# Patient Record
Sex: Female | Born: 1970
Health system: Southern US, Community
[De-identification: ages and names within clinical notes are randomized; demographics above are authoritative.]

## PROBLEM LIST (undated history)

## (undated) DIAGNOSIS — D649 Anemia, unspecified: Secondary | ICD-10-CM

## (undated) DIAGNOSIS — J189 Pneumonia, unspecified organism: Secondary | ICD-10-CM

## (undated) DIAGNOSIS — E119 Type 2 diabetes mellitus without complications: Secondary | ICD-10-CM

## (undated) DIAGNOSIS — H548 Legal blindness, as defined in USA: Secondary | ICD-10-CM

## (undated) DIAGNOSIS — J45909 Unspecified asthma, uncomplicated: Secondary | ICD-10-CM

## (undated) HISTORY — DX: Type 2 diabetes mellitus without complications: E11.9

---

## 2006-09-21 ENCOUNTER — Emergency Department (HOSPITAL_COMMUNITY): Admission: EM | Admit: 2006-09-21 | Discharge: 2006-09-21 | Payer: Self-pay | Admitting: Emergency Medicine

## 2006-09-23 ENCOUNTER — Emergency Department (HOSPITAL_COMMUNITY): Admission: EM | Admit: 2006-09-23 | Discharge: 2006-09-23 | Payer: Self-pay | Admitting: Emergency Medicine

## 2006-09-26 ENCOUNTER — Emergency Department (HOSPITAL_COMMUNITY): Admission: EM | Admit: 2006-09-26 | Discharge: 2006-09-26 | Payer: Self-pay | Admitting: Family Medicine

## 2006-12-07 ENCOUNTER — Emergency Department (HOSPITAL_COMMUNITY): Admission: EM | Admit: 2006-12-07 | Discharge: 2006-12-07 | Payer: Self-pay | Admitting: Family Medicine

## 2007-07-03 ENCOUNTER — Emergency Department (HOSPITAL_COMMUNITY): Admission: EM | Admit: 2007-07-03 | Discharge: 2007-07-03 | Payer: Self-pay | Admitting: Emergency Medicine

## 2007-12-03 ENCOUNTER — Emergency Department (HOSPITAL_COMMUNITY): Admission: EM | Admit: 2007-12-03 | Discharge: 2007-12-03 | Payer: Self-pay | Admitting: Emergency Medicine

## 2008-08-04 ENCOUNTER — Emergency Department (HOSPITAL_COMMUNITY): Admission: EM | Admit: 2008-08-04 | Discharge: 2008-08-04 | Payer: Self-pay | Admitting: Emergency Medicine

## 2010-09-01 ENCOUNTER — Emergency Department (HOSPITAL_COMMUNITY): Admission: EM | Admit: 2010-09-01 | Discharge: 2010-09-01 | Payer: Self-pay | Admitting: Emergency Medicine

## 2010-09-11 ENCOUNTER — Emergency Department (HOSPITAL_COMMUNITY)
Admission: EM | Admit: 2010-09-11 | Discharge: 2010-09-12 | Payer: Self-pay | Source: Home / Self Care | Admitting: Emergency Medicine

## 2010-12-18 LAB — POCT I-STAT, CHEM 8
BUN: 12 mg/dL (ref 6–23)
Chloride: 106 mEq/L (ref 96–112)
Creatinine, Ser: 1.1 mg/dL (ref 0.4–1.2)
Potassium: 3 mEq/L — ABNORMAL LOW (ref 3.5–5.1)
Sodium: 142 mEq/L (ref 135–145)
TCO2: 24 mmol/L (ref 0–100)

## 2010-12-18 LAB — CBC
HCT: 27.1 % — ABNORMAL LOW (ref 36.0–46.0)
MCV: 69.3 fL — ABNORMAL LOW (ref 78.0–100.0)
RDW: 17.9 % — ABNORMAL HIGH (ref 11.5–15.5)
WBC: 6.5 10*3/uL (ref 4.0–10.5)

## 2010-12-18 LAB — DIFFERENTIAL
Basophils Relative: 1 % (ref 0–1)
Eosinophils Absolute: 0.1 10*3/uL (ref 0.0–0.7)
Lymphocytes Relative: 48 % — ABNORMAL HIGH (ref 12–46)
Neutro Abs: 2.9 10*3/uL (ref 1.7–7.7)

## 2011-01-21 ENCOUNTER — Emergency Department (HOSPITAL_COMMUNITY)
Admission: EM | Admit: 2011-01-21 | Discharge: 2011-01-21 | Disposition: A | Discharge status: Home / Self Care | Payer: Self-pay | Attending: Emergency Medicine | Admitting: Emergency Medicine

## 2011-01-21 ENCOUNTER — Emergency Department (HOSPITAL_COMMUNITY): Payer: Self-pay

## 2011-01-21 DIAGNOSIS — R059 Cough, unspecified: Secondary | ICD-10-CM | POA: Insufficient documentation

## 2011-01-21 DIAGNOSIS — IMO0001 Reserved for inherently not codable concepts without codable children: Secondary | ICD-10-CM | POA: Insufficient documentation

## 2011-01-21 DIAGNOSIS — E669 Obesity, unspecified: Secondary | ICD-10-CM | POA: Insufficient documentation

## 2011-01-21 DIAGNOSIS — R05 Cough: Secondary | ICD-10-CM | POA: Insufficient documentation

## 2011-01-21 DIAGNOSIS — J45909 Unspecified asthma, uncomplicated: Secondary | ICD-10-CM | POA: Insufficient documentation

## 2011-01-21 DIAGNOSIS — R109 Unspecified abdominal pain: Secondary | ICD-10-CM | POA: Insufficient documentation

## 2011-01-21 DIAGNOSIS — J3489 Other specified disorders of nose and nasal sinuses: Secondary | ICD-10-CM | POA: Insufficient documentation

## 2011-02-12 ENCOUNTER — Emergency Department (HOSPITAL_COMMUNITY)
Admission: EM | Admit: 2011-02-12 | Discharge: 2011-02-13 | Disposition: A | Discharge status: Home / Self Care | Payer: Self-pay | Attending: Emergency Medicine | Admitting: Emergency Medicine

## 2011-02-12 DIAGNOSIS — L03119 Cellulitis of unspecified part of limb: Secondary | ICD-10-CM | POA: Insufficient documentation

## 2011-02-12 DIAGNOSIS — L02419 Cutaneous abscess of limb, unspecified: Secondary | ICD-10-CM | POA: Insufficient documentation

## 2011-06-29 LAB — DIFFERENTIAL
Basophils Relative: 1
Eosinophils Absolute: 0.1
Lymphs Abs: 2.7
Monocytes Absolute: 0.3
Neutro Abs: 4

## 2011-06-29 LAB — I-STAT 8, (EC8 V) (CONVERTED LAB)
BUN: 9
Bicarbonate: 24.5 — ABNORMAL HIGH
Glucose, Bld: 87
TCO2: 26
pH, Ven: 7.371 — ABNORMAL HIGH

## 2011-06-29 LAB — CBC
HCT: 31.3 — ABNORMAL LOW
Hemoglobin: 10 — ABNORMAL LOW
MCHC: 31.9
MCV: 78.1
RBC: 4.01

## 2011-06-29 LAB — POCT I-STAT CREATININE: Creatinine, Ser: 1

## 2011-07-19 ENCOUNTER — Emergency Department (HOSPITAL_COMMUNITY)
Admission: EM | Admit: 2011-07-19 | Discharge: 2011-07-19 | Disposition: A | Discharge status: Home / Self Care | Payer: Self-pay | Attending: Emergency Medicine | Admitting: Emergency Medicine

## 2011-07-19 ENCOUNTER — Emergency Department (HOSPITAL_COMMUNITY): Payer: Self-pay

## 2011-07-19 DIAGNOSIS — R059 Cough, unspecified: Secondary | ICD-10-CM | POA: Insufficient documentation

## 2011-07-19 DIAGNOSIS — R05 Cough: Secondary | ICD-10-CM | POA: Insufficient documentation

## 2011-07-19 DIAGNOSIS — R062 Wheezing: Secondary | ICD-10-CM | POA: Insufficient documentation

## 2011-07-19 DIAGNOSIS — J40 Bronchitis, not specified as acute or chronic: Secondary | ICD-10-CM | POA: Insufficient documentation

## 2011-07-19 DIAGNOSIS — R079 Chest pain, unspecified: Secondary | ICD-10-CM | POA: Insufficient documentation

## 2011-07-19 LAB — CULTURE, ROUTINE-ABSCESS: Gram Stain: NONE SEEN

## 2011-10-28 ENCOUNTER — Emergency Department (HOSPITAL_COMMUNITY)
Admission: EM | Admit: 2011-10-28 | Discharge: 2011-10-28 | Disposition: A | Discharge status: Home / Self Care | Payer: Self-pay | Attending: Emergency Medicine | Admitting: Emergency Medicine

## 2011-10-28 ENCOUNTER — Encounter (HOSPITAL_COMMUNITY): Payer: Self-pay | Admitting: Emergency Medicine

## 2011-10-28 ENCOUNTER — Emergency Department (HOSPITAL_COMMUNITY): Payer: Self-pay

## 2011-10-28 DIAGNOSIS — R5381 Other malaise: Secondary | ICD-10-CM | POA: Insufficient documentation

## 2011-10-28 DIAGNOSIS — IMO0001 Reserved for inherently not codable concepts without codable children: Secondary | ICD-10-CM | POA: Insufficient documentation

## 2011-10-28 DIAGNOSIS — J45901 Unspecified asthma with (acute) exacerbation: Secondary | ICD-10-CM

## 2011-10-28 DIAGNOSIS — Z79899 Other long term (current) drug therapy: Secondary | ICD-10-CM | POA: Insufficient documentation

## 2011-10-28 DIAGNOSIS — R070 Pain in throat: Secondary | ICD-10-CM | POA: Insufficient documentation

## 2011-10-28 DIAGNOSIS — R6889 Other general symptoms and signs: Secondary | ICD-10-CM

## 2011-10-28 MED ORDER — ALBUTEROL SULFATE (5 MG/ML) 0.5% IN NEBU
10.0000 mg | INHALATION_SOLUTION | Freq: Once | RESPIRATORY_TRACT | Status: AC
  Administered 2011-10-28: 10 mg via RESPIRATORY_TRACT
  Filled 2011-10-28: qty 2

## 2011-10-28 MED ORDER — IBUPROFEN 200 MG PO TABS
400.0000 mg | ORAL_TABLET | Freq: Once | ORAL | Status: AC
  Administered 2011-10-28: 400 mg via ORAL
  Filled 2011-10-28: qty 2

## 2011-10-28 MED ORDER — ALBUTEROL SULFATE (5 MG/ML) 0.5% IN NEBU: 2.5000 mg | INHALATION_SOLUTION | Freq: Four times a day (QID) | RESPIRATORY_TRACT | Status: DC | PRN

## 2011-10-28 MED ORDER — BENZONATATE 100 MG PO CAPS: 100.0000 mg | ORAL_CAPSULE | Freq: Three times a day (TID) | ORAL | Status: AC

## 2011-10-28 MED ORDER — ALBUTEROL SULFATE (5 MG/ML) 0.5% IN NEBU
5.0000 mg | INHALATION_SOLUTION | Freq: Once | RESPIRATORY_TRACT | Status: AC
  Administered 2011-10-28: 5 mg via RESPIRATORY_TRACT
  Filled 2011-10-28: qty 1

## 2011-10-28 MED ORDER — IPRATROPIUM BROMIDE 0.02 % IN SOLN
0.5000 mg | RESPIRATORY_TRACT | Status: AC
  Administered 2011-10-28: 0.5 mg via RESPIRATORY_TRACT
  Filled 2011-10-28: qty 2.5

## 2011-10-28 MED ORDER — PREDNISONE 10 MG PO TABS: 20.0000 mg | ORAL_TABLET | Freq: Every day | ORAL | Status: DC

## 2011-10-28 MED ORDER — PREDNISONE 20 MG PO TABS
60.0000 mg | ORAL_TABLET | Freq: Once | ORAL | Status: AC
  Administered 2011-10-28: 60 mg via ORAL
  Filled 2011-10-28: qty 3

## 2011-10-28 NOTE — ED Notes (Signed)
Last night felt sick, cough, and wheezing, audible wheezing,

## 2011-10-28 NOTE — ED Provider Notes (Signed)
Pt feeling much better after hour long neb. Pt originally seen by Jaci Carrel PAC. Pt will be given nebulizer solution for home, as she is currently out. Prednisone and Tessalon Perls. Pt is currently comfortable with plan and will return to ED if symptoms worsen.  Dorthula Matas, PA 10/28/11 1607

## 2011-10-28 NOTE — ED Provider Notes (Signed)
History     CSN: [id number]  Arrival date & time 10/28/11  1200   First MD Initiated Contact with Patient 10/28/11 1306      Chief Complaint  Patient presents with  . Cough  . Wheezing    (Consider location/radiation/quality/duration/timing/severity/associated sxs/prior treatment) HPI Comments: Patient presents emergency Department with an asthma exacerbation flulike symptoms.  Symptoms began last evening include cough,  sore throat,  body aches, fatigue.  Cough is non-productive. The patient states she has never been diagnosed with asthma and does not have medications at home.  She has needed a breathing treatment before for audible wheezing in the past.  Patient does not have a primary care doctor followup with.  She reports shortness of breath.  She denies chest pain, leg swelling, fevers, night sweats and chills.   The history is provided by the patient.    History reviewed. No pertinent past medical history.  History reviewed. No pertinent past surgical history.  No family history on file.  History  Substance Use Topics  . Smoking status: Not on file  . Smokeless tobacco: Not on file  . Alcohol Use: Not on file    OB History    Grav Para Term Preterm Abortions TAB SAB Ect Mult Living                  Review of Systems  Constitutional: Negative for fever, chills, appetite change and fatigue.  HENT: Positive for congestion and rhinorrhea. Negative for hearing loss, ear pain, nosebleeds, sore throat, sneezing, trouble swallowing, neck stiffness, voice change, postnasal drip, sinus pressure, tinnitus and ear discharge.   Eyes: Negative for photophobia and visual disturbance.  Respiratory: Positive for cough, chest tightness, shortness of breath and wheezing. Negative for apnea, choking and stridor.   Cardiovascular: Negative for chest pain, palpitations and leg swelling.  Gastrointestinal: Negative for nausea, vomiting, abdominal pain, diarrhea and constipation.    Genitourinary: Negative for dysuria, urgency and flank pain.  Musculoskeletal: Negative for myalgias.  Neurological: Negative for dizziness, seizures, syncope, weakness, light-headedness, numbness and headaches.  Psychiatric/Behavioral: Negative for behavioral problems and confusion.  All other systems reviewed and are negative.    Allergies  Review of patient's allergies indicates no known allergies.  Home Medications   Current Outpatient Rx  Name Route Sig Dispense Refill  . ACETAMINOPHEN 500 MG PO TABS Oral Take 500 mg by mouth every 6 (six) hours as needed. pain    . GUAIFENESIN 100 MG/5ML PO SOLN Oral Take 5 mLs by mouth every 4 (four) hours as needed. congestion      BP 127/74  Pulse 92  Temp(Src) 98 F (36.7 C) (Oral)  Resp 20  SpO2 97%  Physical Exam  Vitals reviewed. Constitutional: She is oriented to person, place, and time. Vital signs are normal. She appears well-developed and well-nourished. No distress. She is not intubated.       NAD  HENT:  Head: Normocephalic and atraumatic. No trismus in the jaw.  Right Ear: External ear normal. No drainage or tenderness. No mastoid tenderness.  Left Ear: External ear normal. No drainage or tenderness. No mastoid tenderness.  Nose: Nose normal. No rhinorrhea or sinus tenderness.  Mouth/Throat: Uvula is midline, oropharynx is clear and moist and mucous membranes are normal. No uvula swelling. No oropharyngeal exudate.  Eyes: Conjunctivae and EOM are normal. Pupils are equal, round, and reactive to light. Right eye exhibits no discharge. Left eye exhibits no discharge. No scleral icterus.  Neck: Normal range  of motion. Neck supple.  Cardiovascular: Normal rate, regular rhythm, normal heart sounds and intact distal pulses.   Pulmonary/Chest: Effort normal. No accessory muscle usage or stridor. No apnea and not tachypneic. She is not intubated. No respiratory distress. She has wheezes. She exhibits no tenderness.        Expiratory wheezing audible without stethoscope.  Patient able to speak in complete sentences.  Airway intact.  Abdominal: Soft. There is no tenderness.  Musculoskeletal: Normal range of motion. She exhibits no edema and no tenderness.  Neurological: She is alert and oriented to person, place, and time.  Skin: Skin is warm and dry. No rash noted. She is not diaphoretic.  Psychiatric: She has a normal mood and affect. Her behavior is normal.    ED Course  Procedures (including critical care time)  Labs Reviewed - No data to display Dg Chest 2 View  10/28/2011  *RADIOLOGY REPORT*  Clinical Data: Shortness of breath  CHEST - 2 VIEW  Comparison: 07/19/2011  Findings: Lungs are clear. No pleural effusion or pneumothorax.  Stable mild cardiomegaly.  Mild degenerative changes of the visualized thoracolumbar spine.  IMPRESSION: No evidence of acute cardiopulmonary disease.  Stable mild cardiomegaly.  Original Report Authenticated By: Charline Bills, M.D.     No diagnosis found.  Pt still wheezing after prednisone and neb. CXR negative. 1hr long neb treatment ordered. Pt likely has viral syndrome that has induced asthma exacerbation.  Discussed plan w tiffany. Admit if still audibly wheezing or unable to keep O2 sat in 90s after ambulation vs discharge with prednisone an albuterol inhaler and hycoden. Pt does not want tamiflu d/t cost.   MDM  Asthma exacerbation & flu like s/s  Pt care resumed by Marlon Pel PA-C.         Jaci Carrel, PA-C 10/28/11 [address] PA-C 10/28/11 1534

## 2011-10-28 NOTE — ED Notes (Signed)
Nasal congestion also

## 2011-10-28 NOTE — ED Provider Notes (Signed)
Medical screening examination/treatment/procedure(s) were performed by non-physician practitioner and as supervising physician I was immediately available for consultation/collaboration.   Rolan Bucco, MD 10/28/11 [phone]

## 2012-02-16 ENCOUNTER — Emergency Department (INDEPENDENT_AMBULATORY_CARE_PROVIDER_SITE_OTHER): Payer: Worker's Compensation

## 2012-02-16 ENCOUNTER — Encounter (HOSPITAL_COMMUNITY): Payer: Self-pay

## 2012-02-16 ENCOUNTER — Emergency Department (INDEPENDENT_AMBULATORY_CARE_PROVIDER_SITE_OTHER)
Admission: EM | Admit: 2012-02-16 | Discharge: 2012-02-16 | Disposition: A | Discharge status: Home / Self Care | Payer: Worker's Compensation | Source: Home / Self Care | Attending: Family Medicine | Admitting: Family Medicine

## 2012-02-16 DIAGNOSIS — S93402A Sprain of unspecified ligament of left ankle, initial encounter: Secondary | ICD-10-CM

## 2012-02-16 DIAGNOSIS — S93409A Sprain of unspecified ligament of unspecified ankle, initial encounter: Secondary | ICD-10-CM

## 2012-02-16 MED ORDER — DICLOFENAC POTASSIUM 50 MG PO TABS: 50.0000 mg | ORAL_TABLET | Freq: Three times a day (TID) | ORAL | Status: DC

## 2012-02-16 NOTE — ED Notes (Signed)
Pt has had lt foot pain since Nov after slipping and turning her foot. She didn't fall.

## 2012-02-16 NOTE — ED Provider Notes (Signed)
History     CSN: [id number]  Arrival date & time 02/16/12  1605   First MD Initiated Contact with Patient 02/16/12 1615      Chief Complaint  Patient presents with  . Foot Pain    (Consider location/radiation/quality/duration/timing/severity/associated sxs/prior treatment) Patient is a 41 y.o. female presenting with ankle pain. The history is provided by the patient.  Ankle Pain  The incident occurred more than 1 week ago (states fell 2 months ago and twisted left ankle, continues with pain since.). The incident occurred at work. The injury mechanism was a fall. The pain is present in the left ankle. The pain is moderate.    History reviewed. No pertinent past medical history.  History reviewed. No pertinent past surgical history.  History reviewed. No pertinent family history.  History  Substance Use Topics  . Smoking status: Current Everyday Smoker  . Smokeless tobacco: Not on file  . Alcohol Use: No    OB History    Grav Para Term Preterm Abortions TAB SAB Ect Mult Living                  Review of Systems  Constitutional: Negative.   Musculoskeletal: Positive for gait problem. Negative for joint swelling.    Allergies  Review of patient's allergies indicates no known allergies.  Home Medications   Current Outpatient Rx  Name Route Sig Dispense Refill  . ACETAMINOPHEN 500 MG PO TABS Oral Take 500 mg by mouth every 6 (six) hours as needed. pain    . ALBUTEROL SULFATE (5 MG/ML) 0.5% IN NEBU Nebulization Take 0.5 mLs (2.5 mg total) by nebulization every 6 (six) hours as needed for wheezing. 20 mL 5  . DICLOFENAC POTASSIUM 50 MG PO TABS Oral Take 1 tablet (50 mg total) by mouth 3 (three) times daily. As needed for pain 30 tablet 0  . GUAIFENESIN 100 MG/5ML PO SOLN Oral Take 5 mLs by mouth every 4 (four) hours as needed. congestion    . PREDNISONE 10 MG PO TABS Oral Take 2 tablets (20 mg total) by mouth daily. 15 tablet 0    BP 135/53  Pulse 71  Temp(Src)  98.1 F (36.7 C) (Oral)  Resp 18  SpO2 100%  LMP 02/03/2012  Physical Exam  Nursing note and vitals reviewed. Constitutional: She is oriented to person, place, and time. She appears well-developed and well-nourished.  Musculoskeletal: She exhibits tenderness. She exhibits no edema.       Left ankle: She exhibits no swelling, no ecchymosis and no deformity. tenderness. AITFL and CF ligament tenderness found. No lateral malleolus, no medial malleolus, no posterior TFL, no head of 5th metatarsal and no proximal fibula tenderness found.  Neurological: She is alert and oriented to person, place, and time.    ED Course  Procedures (including critical care time)  Labs Reviewed - No data to display Dg Ankle Complete Left  02/16/2012  *RADIOLOGY REPORT*  Clinical Data: Lateral ankle pain.  LEFT ANKLE COMPLETE - 3+ VIEW  Comparison: None.  Findings: Imaged bones, joints and soft tissues appear normal.  IMPRESSION: Negative exam.  Original Report Authenticated By: Bernadene Bell. D'ALESSIO, M.D.     1. Mild ankle sprain, left, initial encounter       MDM          Linna Hoff, MD 02/16/12 [phone]

## 2012-09-30 ENCOUNTER — Emergency Department (HOSPITAL_COMMUNITY)
Admission: EM | Admit: 2012-09-30 | Discharge: 2012-09-30 | Disposition: A | Discharge status: Home / Self Care | Payer: Self-pay | Attending: Emergency Medicine | Admitting: Emergency Medicine

## 2012-09-30 ENCOUNTER — Encounter (HOSPITAL_COMMUNITY): Payer: Self-pay | Admitting: *Deleted

## 2012-09-30 ENCOUNTER — Emergency Department (HOSPITAL_COMMUNITY): Payer: Self-pay

## 2012-09-30 DIAGNOSIS — R51 Headache: Secondary | ICD-10-CM | POA: Insufficient documentation

## 2012-09-30 DIAGNOSIS — R05 Cough: Secondary | ICD-10-CM | POA: Insufficient documentation

## 2012-09-30 DIAGNOSIS — R059 Cough, unspecified: Secondary | ICD-10-CM | POA: Insufficient documentation

## 2012-09-30 DIAGNOSIS — N926 Irregular menstruation, unspecified: Secondary | ICD-10-CM | POA: Insufficient documentation

## 2012-09-30 DIAGNOSIS — F17200 Nicotine dependence, unspecified, uncomplicated: Secondary | ICD-10-CM | POA: Insufficient documentation

## 2012-09-30 DIAGNOSIS — J45909 Unspecified asthma, uncomplicated: Secondary | ICD-10-CM

## 2012-09-30 DIAGNOSIS — R209 Unspecified disturbances of skin sensation: Secondary | ICD-10-CM | POA: Insufficient documentation

## 2012-09-30 DIAGNOSIS — Z3202 Encounter for pregnancy test, result negative: Secondary | ICD-10-CM | POA: Insufficient documentation

## 2012-09-30 DIAGNOSIS — J45901 Unspecified asthma with (acute) exacerbation: Secondary | ICD-10-CM | POA: Insufficient documentation

## 2012-09-30 DIAGNOSIS — N949 Unspecified condition associated with female genital organs and menstrual cycle: Secondary | ICD-10-CM | POA: Insufficient documentation

## 2012-09-30 DIAGNOSIS — D649 Anemia, unspecified: Secondary | ICD-10-CM | POA: Insufficient documentation

## 2012-09-30 DIAGNOSIS — N938 Other specified abnormal uterine and vaginal bleeding: Secondary | ICD-10-CM | POA: Insufficient documentation

## 2012-09-30 LAB — CBC WITH DIFFERENTIAL/PLATELET
Basophils Absolute: 0 10*3/uL (ref 0.0–0.1)
Basophils Relative: 0 % (ref 0–1)
Eosinophils Relative: 1 % (ref 0–5)
HCT: 25.3 % — ABNORMAL LOW (ref 36.0–46.0)
Hemoglobin: 7.3 g/dL — ABNORMAL LOW (ref 12.0–15.0)
MCH: 20.3 pg — ABNORMAL LOW (ref 26.0–34.0)
MCHC: 28.9 g/dL — ABNORMAL LOW (ref 30.0–36.0)
MCV: 70.5 fL — ABNORMAL LOW (ref 78.0–100.0)
Monocytes Absolute: 0.4 10*3/uL (ref 0.1–1.0)
Monocytes Relative: 4 % (ref 3–12)
Neutro Abs: 6 10*3/uL (ref 1.7–7.7)
RDW: 18.4 % — ABNORMAL HIGH (ref 11.5–15.5)

## 2012-09-30 LAB — BASIC METABOLIC PANEL
CO2: 25 mEq/L (ref 19–32)
Calcium: 9.3 mg/dL (ref 8.4–10.5)
Chloride: 103 mEq/L (ref 96–112)
Creatinine, Ser: 0.84 mg/dL (ref 0.50–1.10)
Glucose, Bld: 84 mg/dL (ref 70–99)

## 2012-09-30 LAB — POCT PREGNANCY, URINE: Preg Test, Ur: NEGATIVE

## 2012-09-30 MED ORDER — IPRATROPIUM BROMIDE 0.02 % IN SOLN
0.5000 mg | Freq: Once | RESPIRATORY_TRACT | Status: AC
  Administered 2012-09-30: 0.5 mg via RESPIRATORY_TRACT
  Filled 2012-09-30: qty 2.5

## 2012-09-30 MED ORDER — ALBUTEROL SULFATE HFA 108 (90 BASE) MCG/ACT IN AERS: 1.0000 | INHALATION_SPRAY | Freq: Four times a day (QID) | RESPIRATORY_TRACT | Status: DC | PRN

## 2012-09-30 MED ORDER — ALBUTEROL SULFATE (5 MG/ML) 0.5% IN NEBU
5.0000 mg | INHALATION_SOLUTION | Freq: Once | RESPIRATORY_TRACT | Status: AC
  Administered 2012-09-30: 5 mg via RESPIRATORY_TRACT
  Filled 2012-09-30: qty 1

## 2012-09-30 MED ORDER — FERROUS SULFATE 325 (65 FE) MG PO TABS: 325.0000 mg | ORAL_TABLET | Freq: Every day | ORAL | Status: DC

## 2012-09-30 NOTE — ED Notes (Signed)
Pt reports numbness to bil hands, unable to grip well. C/o generalized weakness, "I think it's my BP." No known Hx HTN. Also reports vaginal bleeding x3 months with clots. Has not sought treatment for this.

## 2012-09-30 NOTE — ED Notes (Signed)
Pt reports wheezing and chest tightness x1-2 weeks.

## 2012-09-30 NOTE — ED Provider Notes (Signed)
History     CSN: [id number]  Arrival date & time 09/30/12  1435   First MD Initiated Contact with Patient 09/30/12 1512      Chief Complaint  Patient presents with  . Weakness  . Headache  . Numbness    (Consider location/radiation/quality/duration/timing/severity/associated sxs/prior treatment) HPI Patient presents to the emergency room with complaints of cough, wheezing bodyaches and general weakness for the last week. Patient does smoke but she has been trying to quit. She has not noticed any fevers. She denies any sore throat or congestion.  She does think her ankles are swollen sometimes. She has not noticed any particular weight change. She denies polydipsia or polyuria  Patient also has been having irregular vaginal bleeding for the last 3 months. She has not been able to see her primary doctor she does not have insurance yet.  The patient is also concerned about the possibly of high blood pressure. She's been told about her blood pressure in the past but does not currently take any medications for that.  History reviewed. No pertinent past medical history.  Past Surgical History  Procedure Date  . Cesarean section     No family history on file.  History  Substance Use Topics  . Smoking status: Current Every Day Smoker  . Smokeless tobacco: Not on file  . Alcohol Use: Yes     Comment: occasionally     OB History    Grav Para Term Preterm Abortions TAB SAB Ect Mult Living                  Review of Systems  All other systems reviewed and are negative.    Allergies  Review of patient's allergies indicates no known allergies.  Home Medications  No current outpatient prescriptions on file.  BP 175/64  Temp 98.2 F (36.8 C) (Oral)  Resp 27  SpO2 100%  LMP 09/30/2012  Physical Exam  Nursing note and vitals reviewed. Constitutional: She is oriented to person, place, and time. No distress.       Obese  HENT:  Head: Normocephalic and atraumatic.   Right Ear: External ear normal.  Left Ear: External ear normal.  Eyes: Conjunctivae normal are normal. Right eye exhibits no discharge. Left eye exhibits no discharge. No scleral icterus.  Neck: Neck supple. No tracheal deviation present.  Cardiovascular: Normal rate, regular rhythm and intact distal pulses.   Pulmonary/Chest: Effort normal and breath sounds normal. No stridor. No respiratory distress. She has no wheezes. She has no rales.  Abdominal: Soft. Bowel sounds are normal. She exhibits no distension. There is no tenderness. There is no rebound and no guarding.  Musculoskeletal: She exhibits no edema and no tenderness.       No pitting edema noted  Neurological: She is alert and oriented to person, place, and time. She has normal strength. No cranial nerve deficit ( no gross defecits noted) or sensory deficit. She exhibits normal muscle tone. She displays no seizure activity. Coordination normal.  Skin: Skin is warm and dry. No rash noted.  Psychiatric: She has a normal mood and affect.    ED Course  Procedures (including critical care time)  Rate: 85  Rhythm: normal sinus rhythm  QRS Axis: normal  Intervals: normal  ST/T Wave abnormalities: normal  Conduction Disutrbances:none  Narrative Interpretation: nl  Old EKG Reviewed: none available  Labs Reviewed  CBC WITH DIFFERENTIAL - Abnormal; Notable for the following:    RBC 3.59 (*)  Hemoglobin 7.3 (*)     HCT 25.3 (*)     MCV 70.5 (*)     MCH 20.3 (*)     MCHC 28.9 (*)     RDW 18.4 (*)     Platelets 611 (*)     All other components within normal limits  BASIC METABOLIC PANEL - Abnormal; Notable for the following:    GFR calc non Af Amer 86 (*)     All other components within normal limits  POCT PREGNANCY, URINE   Dg Chest 2 View  09/30/2012  *RADIOLOGY REPORT*  Clinical Data: 41 year old female weakness, headache, numbness.  CHEST - 2 VIEW  Comparison: 10/28/2011 and earlier.  Findings: AP semi upright and  lateral views of the chest 1559 hours.  Large body habitus.  Lung volumes are within normal limits. Cardiomegaly versus mediastinal lipomatosis. Other mediastinal contours are within normal limits.  Visualized tracheal air column is within normal limits.  No pneumothorax, pulmonary edema, pleural effusion or acute pulmonary opacity. No acute osseous abnormality identified.  IMPRESSION:  No acute cardiopulmonary abnormality.   Cardiomegaly versus mediastinal lipomatosis.   Original Report Authenticated By: Erskine Speed, M.D.      1. Anemia   2. Asthma   3. Dysfunctional uterine bleeding       MDM  Patient has been having irregular menstrual periods for the last 3 months.  Patient has a history of irregular menses.  At this point her vital signs are stable.  Recommend that she followup with a gynecologist for further evaluation. I reviewed her old records and the patient was noted to have similar symptoms back in 2011. She was found to be anemic with a similar hemoglobin and was recommended followup with gynecologist. Patient was unable to do so. The stomach and I feel that she requires an emergent transfusion. He'll prescribe her iron. She will be given resources to followup with gynecologist as an outpatient .  The patient was counseled on smoking cessation. I will give her prescription for albuterol         Celene Kras, MD 09/30/12 1743

## 2012-12-27 ENCOUNTER — Emergency Department (HOSPITAL_COMMUNITY)
Admission: EM | Admit: 2012-12-27 | Discharge: 2012-12-27 | Disposition: A | Discharge status: Home / Self Care | Payer: Self-pay | Attending: Emergency Medicine | Admitting: Emergency Medicine

## 2012-12-27 ENCOUNTER — Encounter (HOSPITAL_COMMUNITY): Payer: Self-pay | Admitting: *Deleted

## 2012-12-27 ENCOUNTER — Emergency Department (HOSPITAL_COMMUNITY): Payer: Self-pay

## 2012-12-27 DIAGNOSIS — J209 Acute bronchitis, unspecified: Secondary | ICD-10-CM | POA: Insufficient documentation

## 2012-12-27 DIAGNOSIS — F17200 Nicotine dependence, unspecified, uncomplicated: Secondary | ICD-10-CM | POA: Insufficient documentation

## 2012-12-27 DIAGNOSIS — R05 Cough: Secondary | ICD-10-CM | POA: Insufficient documentation

## 2012-12-27 DIAGNOSIS — Z79899 Other long term (current) drug therapy: Secondary | ICD-10-CM | POA: Insufficient documentation

## 2012-12-27 DIAGNOSIS — R059 Cough, unspecified: Secondary | ICD-10-CM | POA: Insufficient documentation

## 2012-12-27 DIAGNOSIS — IMO0001 Reserved for inherently not codable concepts without codable children: Secondary | ICD-10-CM | POA: Insufficient documentation

## 2012-12-27 DIAGNOSIS — R062 Wheezing: Secondary | ICD-10-CM | POA: Insufficient documentation

## 2012-12-27 MED ORDER — ACETAMINOPHEN 500 MG PO TABS
1000.0000 mg | ORAL_TABLET | Freq: Once | ORAL | Status: AC
  Administered 2012-12-27: 1000 mg via ORAL
  Filled 2012-12-27: qty 2

## 2012-12-27 MED ORDER — PREDNISONE 10 MG PO TABS: 40.0000 mg | ORAL_TABLET | Freq: Every day | ORAL | Status: DC

## 2012-12-27 MED ORDER — ALBUTEROL SULFATE (5 MG/ML) 0.5% IN NEBU
5.0000 mg | INHALATION_SOLUTION | Freq: Once | RESPIRATORY_TRACT | Status: AC
  Administered 2012-12-27: 5 mg via RESPIRATORY_TRACT
  Filled 2012-12-27: qty 0.5

## 2012-12-27 MED ORDER — PREDNISONE 20 MG PO TABS
40.0000 mg | ORAL_TABLET | Freq: Once | ORAL | Status: AC
  Administered 2012-12-27: 40 mg via ORAL
  Filled 2012-12-27: qty 2

## 2012-12-27 MED ORDER — ALBUTEROL SULFATE (5 MG/ML) 0.5% IN NEBU
5.0000 mg | INHALATION_SOLUTION | Freq: Once | RESPIRATORY_TRACT | Status: AC
  Administered 2012-12-27: 5 mg via RESPIRATORY_TRACT
  Filled 2012-12-27: qty 1

## 2012-12-27 NOTE — ED Notes (Signed)
Patient transported to X-ray 

## 2012-12-27 NOTE — ED Notes (Signed)
Pt with cough x 2 days with wheezing and L sided chest pain post cough since last night.  Sats of 97 but rr of 30.

## 2012-12-27 NOTE — ED Provider Notes (Signed)
History     CSN: [id number]  Arrival date & time 12/27/12  1129   First MD Initiated Contact with Patient 12/27/12 1158      Chief Complaint  Patient presents with  . Shortness of Breath  . Cough    (Consider location/radiation/quality/duration/timing/severity/associated sxs/prior treatment) HPI Presents with wheezing shortness of breath and cough onset 2 days ago. Maximum temperature 100. Cough nonproductive. Patient has had similar episodes in the past with "colds or whenever I get a virus". No vomiting. Admits to diffuse myalgias. No other associated symptoms. Treated with albuterol inhaler and over-the-counter cough medicines, without relief. Nothing makes symptoms better or worse. History reviewed. No pertinent past medical history.  Past Surgical History  Procedure Laterality Date  . Cesarean section      No family history on file.  History  Substance Use Topics  . Smoking status: Current Every Day Smoker -- 0.50 packs/day    Types: Cigarettes  . Smokeless tobacco: Not on file  . Alcohol Use: Yes     Comment: occasionally    Denies alcohol denies drug use OB History   Grav Para Term Preterm Abortions TAB SAB Ect Mult Living                  Review of Systems  Respiratory: Positive for cough, shortness of breath and wheezing.   Musculoskeletal: Positive for myalgias.    Allergies  Review of patient's allergies indicates no known allergies.  Home Medications   Current Outpatient Rx  Name  Route  Sig  Dispense  Refill  . albuterol (PROVENTIL HFA;VENTOLIN HFA) 108 (90 BASE) MCG/ACT inhaler   Inhalation   Inhale 1-2 puffs into the lungs every 6 (six) hours as needed for wheezing.   1 Inhaler   0   . ferrous sulfate 325 (65 FE) MG tablet   Oral   Take 1 tablet (325 mg total) by mouth daily.   30 tablet   0     BP 129/89  Pulse 105  Temp(Src) 99 F (37.2 C) (Oral)  Resp 30  SpO2 97%  LMP 12/26/2012  Physical Exam  Nursing note and vitals  reviewed. Constitutional: She appears well-developed and well-nourished.  HENT:  Head: Normocephalic and atraumatic.  Eyes: Conjunctivae are normal. Pupils are equal, round, and reactive to light.  Neck: Neck supple. No tracheal deviation present. No thyromegaly present.  Cardiovascular: Normal rate and regular rhythm.   No murmur heard. Pulmonary/Chest:  Mild retractions prolonged expiratory phase with expiratory wheezes. Speaks in sentences. Coughing occasionally.  Abdominal: Soft. Bowel sounds are normal. She exhibits no distension. There is no tenderness.  Morbidly obese  Musculoskeletal: Normal range of motion. She exhibits no edema and no tenderness.  Neurological: She is alert. Coordination normal.  Skin: Skin is warm and dry. No rash noted.  Psychiatric: She has a normal mood and affect.    ED Course  Procedures (including critical care time) 12:45 PM breathing with improved after treatment with albuterol nebulizer . Labs Reviewed - No data to display No results found. Results for orders placed during the hospital encounter of 09/30/12  CBC WITH DIFFERENTIAL      Result Value Range   WBC 9.4  4.0 - 10.5 K/uL   RBC 3.59 (*) 3.87 - 5.11 MIL/uL   Hemoglobin 7.3 (*) 12.0 - 15.0 g/dL   HCT 57.8 (*) 46.9 - 62.9 %   MCV 70.5 (*) 78.0 - 100.0 fL   MCH 20.3 (*) 26.0 - 34.0  pg   MCHC 28.9 (*) 30.0 - 36.0 g/dL   RDW 78.2 (*) 95.6 - 21.3 %   Platelets 611 (*) 150 - 400 K/uL   Neutrophils Relative 64  43 - 77 %   Neutro Abs 6.0  1.7 - 7.7 K/uL   Lymphocytes Relative 31  12 - 46 %   Lymphs Abs 2.9  0.7 - 4.0 K/uL   Monocytes Relative 4  3 - 12 %   Monocytes Absolute 0.4  0.1 - 1.0 K/uL   Eosinophils Relative 1  0 - 5 %   Eosinophils Absolute 0.1  0.0 - 0.7 K/uL   Basophils Relative 0  0 - 1 %   Basophils Absolute 0.0  0.0 - 0.1 K/uL   WBC Morphology MILD LEFT SHIFT (1-5% METAS, OCC MYELO, OCC BANDS)     RBC Morphology POLYCHROMASIA PRESENT     Smear Review PLATELET COUNT  CONFIRMED BY SMEAR    BASIC METABOLIC PANEL      Result Value Range   Sodium 136  135 - 145 mEq/L   Potassium 3.7  3.5 - 5.1 mEq/L   Chloride 103  96 - 112 mEq/L   CO2 25  19 - 32 mEq/L   Glucose, Bld 84  70 - 99 mg/dL   BUN 11  6 - 23 mg/dL   Creatinine, Ser 0.86  0.50 - 1.10 mg/dL   Calcium 9.3  8.4 - 57.8 mg/dL   GFR calc non Af Amer 86 (*) >90 mL/min   GFR calc Af Amer >90  >90 mL/min  POCT PREGNANCY, URINE      Result Value Range   Preg Test, Ur NEGATIVE  NEGATIVE   Dg Chest 2 View (if Patient Has Fever And/or Copd)  12/27/2012  *RADIOLOGY REPORT*  Clinical Data: Cough, shortness of breath, chest pain due to cough  CHEST - 2 VIEW  Comparison: 09/30/1929  Findings: Pain mild cardiac enlargement stable.  Vascular pattern normal.  Lungs clear.  No pleural effusion.  IMPRESSION: No acute findings.   Original Report Authenticated By: Esperanza Heir, M.D.      No diagnosis found.  Chest x-ray viewed by me 1:45 PM patient states breathing is almost at baseline. She wishes to go home. On exam she speaks in paragraphs lungs with minimal end expiratory wheezes. No use of accessory muscles. No respiratory distress MDM   Counseled patient for 5 minutes on smoking cessation Plan prescription prednisone. She is encouraged to use her albuterol inhaler 2 puffs every 4 hours as needed for shortness of breath or wheeze return if needed 1 every 4 hours Referral resource guide Diagnosis #1 acute bronchitis #2 tobacco abuse        Doug Sou, MD 12/27/12 [phone]

## 2012-12-31 ENCOUNTER — Inpatient Hospital Stay (HOSPITAL_COMMUNITY)
Admission: EM | Admit: 2012-12-31 | Discharge: 2013-01-03 | DRG: 202 | Disposition: A | Discharge status: Home / Self Care | Payer: MEDICAID | Attending: Internal Medicine | Admitting: Internal Medicine

## 2012-12-31 ENCOUNTER — Emergency Department (HOSPITAL_COMMUNITY): Payer: Self-pay

## 2012-12-31 ENCOUNTER — Encounter (HOSPITAL_COMMUNITY): Payer: Self-pay | Admitting: *Deleted

## 2012-12-31 DIAGNOSIS — F17200 Nicotine dependence, unspecified, uncomplicated: Secondary | ICD-10-CM

## 2012-12-31 DIAGNOSIS — Z87891 Personal history of nicotine dependence: Secondary | ICD-10-CM | POA: Diagnosis present

## 2012-12-31 DIAGNOSIS — J45901 Unspecified asthma with (acute) exacerbation: Principal | ICD-10-CM | POA: Diagnosis present

## 2012-12-31 DIAGNOSIS — J45902 Unspecified asthma with status asthmaticus: Secondary | ICD-10-CM

## 2012-12-31 DIAGNOSIS — J069 Acute upper respiratory infection, unspecified: Secondary | ICD-10-CM | POA: Diagnosis present

## 2012-12-31 DIAGNOSIS — Z6841 Body Mass Index (BMI) 40.0 and over, adult: Secondary | ICD-10-CM

## 2012-12-31 HISTORY — DX: Unspecified asthma, uncomplicated: J45.909

## 2012-12-31 LAB — POCT I-STAT, CHEM 8
BUN: 11 mg/dL (ref 6–23)
Creatinine, Ser: 0.8 mg/dL (ref 0.50–1.10)
Glucose, Bld: 135 mg/dL — ABNORMAL HIGH (ref 70–99)
Potassium: 3.7 mEq/L (ref 3.5–5.1)
Sodium: 141 mEq/L (ref 135–145)

## 2012-12-31 LAB — CBC WITH DIFFERENTIAL/PLATELET
Basophils Relative: 0 % (ref 0–1)
HCT: 32.5 % — ABNORMAL LOW (ref 36.0–46.0)
Hemoglobin: 9.1 g/dL — ABNORMAL LOW (ref 12.0–15.0)
Lymphocytes Relative: 34 % (ref 12–46)
Lymphs Abs: 2 10*3/uL (ref 0.7–4.0)
MCHC: 28 g/dL — ABNORMAL LOW (ref 30.0–36.0)
MCV: 73.2 fL — ABNORMAL LOW (ref 78.0–100.0)
Monocytes Relative: 7 % (ref 3–12)
Neutro Abs: 3.4 10*3/uL (ref 1.7–7.7)
RDW: 19.2 % — ABNORMAL HIGH (ref 11.5–15.5)
WBC: 5.8 10*3/uL (ref 4.0–10.5)

## 2012-12-31 MED ORDER — ALBUTEROL SULFATE (5 MG/ML) 0.5% IN NEBU
5.0000 mg | INHALATION_SOLUTION | Freq: Once | RESPIRATORY_TRACT | Status: AC
  Administered 2012-12-31: 5 mg via RESPIRATORY_TRACT
  Filled 2012-12-31: qty 1

## 2012-12-31 MED ORDER — MAGNESIUM SULFATE 40 MG/ML IJ SOLN
2.0000 g | Freq: Once | INTRAMUSCULAR | Status: AC
  Administered 2012-12-31: 2 g via INTRAVENOUS
  Filled 2012-12-31: qty 50

## 2012-12-31 MED ORDER — ALBUTEROL (5 MG/ML) CONTINUOUS INHALATION SOLN
10.0000 mg/h | INHALATION_SOLUTION | RESPIRATORY_TRACT | Status: DC
  Administered 2012-12-31: 10 mg/h via RESPIRATORY_TRACT

## 2012-12-31 MED ORDER — PREDNISONE 20 MG PO TABS
60.0000 mg | ORAL_TABLET | Freq: Once | ORAL | Status: AC
  Administered 2012-12-31: 60 mg via ORAL
  Filled 2012-12-31: qty 3

## 2012-12-31 MED ORDER — IPRATROPIUM BROMIDE 0.02 % IN SOLN
1.0000 mg | Freq: Once | RESPIRATORY_TRACT | Status: AC
  Administered 2012-12-31: 1 mg via RESPIRATORY_TRACT
  Filled 2012-12-31: qty 5

## 2012-12-31 MED ORDER — METHYLPREDNISOLONE SODIUM SUCC 40 MG IJ SOLR
40.0000 mg | Freq: Two times a day (BID) | INTRAMUSCULAR | Status: DC
  Administered 2012-12-31: 40 mg via INTRAVENOUS
  Filled 2012-12-31 (×3): qty 1

## 2012-12-31 MED ORDER — ACETAMINOPHEN 325 MG PO TABS: 650.0000 mg | ORAL_TABLET | Freq: Four times a day (QID) | ORAL | Status: DC | PRN

## 2012-12-31 MED ORDER — ALBUTEROL SULFATE (5 MG/ML) 0.5% IN NEBU: 2.5000 mg | INHALATION_SOLUTION | RESPIRATORY_TRACT | Status: DC | PRN

## 2012-12-31 MED ORDER — IPRATROPIUM BROMIDE 0.02 % IN SOLN
RESPIRATORY_TRACT | Status: AC
  Administered 2012-12-31: 0.5 mg via RESPIRATORY_TRACT
  Filled 2012-12-31: qty 2.5

## 2012-12-31 MED ORDER — LEVOFLOXACIN 250 MG PO TABS
250.0000 mg | ORAL_TABLET | ORAL | Status: DC
  Administered 2012-12-31: 250 mg via ORAL
  Filled 2012-12-31 (×2): qty 1

## 2012-12-31 MED ORDER — ENOXAPARIN SODIUM 40 MG/0.4ML ~~LOC~~ SOLN
40.0000 mg | SUBCUTANEOUS | Status: DC
  Filled 2012-12-31 (×4): qty 0.4

## 2012-12-31 MED ORDER — PANTOPRAZOLE SODIUM 40 MG PO TBEC
40.0000 mg | DELAYED_RELEASE_TABLET | Freq: Every day | ORAL | Status: DC
  Administered 2013-01-01 – 2013-01-02 (×2): 40 mg via ORAL
  Filled 2012-12-31 (×2): qty 1

## 2012-12-31 MED ORDER — ALBUTEROL SULFATE (5 MG/ML) 0.5% IN NEBU
2.5000 mg | INHALATION_SOLUTION | Freq: Four times a day (QID) | RESPIRATORY_TRACT | Status: DC
  Administered 2012-12-31 – 2013-01-01 (×3): 2.5 mg via RESPIRATORY_TRACT
  Filled 2012-12-31 (×2): qty 0.5

## 2012-12-31 MED ORDER — ACETAMINOPHEN 650 MG RE SUPP: 650.0000 mg | Freq: Four times a day (QID) | RECTAL | Status: DC | PRN

## 2012-12-31 MED ORDER — ALBUTEROL (5 MG/ML) CONTINUOUS INHALATION SOLN
INHALATION_SOLUTION | RESPIRATORY_TRACT | Status: AC
  Administered 2012-12-31: 10 mg/h via RESPIRATORY_TRACT
  Filled 2012-12-31: qty 20

## 2012-12-31 MED ORDER — ZOLPIDEM TARTRATE 5 MG PO TABS
5.0000 mg | ORAL_TABLET | Freq: Once | ORAL | Status: AC
Start: 2012-12-31 — End: 2012-12-31
  Administered 2012-12-31: 5 mg via ORAL
  Filled 2012-12-31: qty 1

## 2012-12-31 MED ORDER — GUAIFENESIN-DM 100-10 MG/5ML PO SYRP: 5.0000 mL | ORAL_SOLUTION | ORAL | Status: DC | PRN

## 2012-12-31 MED ORDER — ONDANSETRON HCL 4 MG/2ML IJ SOLN: 4.0000 mg | Freq: Four times a day (QID) | INTRAMUSCULAR | Status: DC | PRN

## 2012-12-31 MED ORDER — ONDANSETRON HCL 4 MG PO TABS: 4.0000 mg | ORAL_TABLET | Freq: Four times a day (QID) | ORAL | Status: DC | PRN

## 2012-12-31 MED ORDER — IPRATROPIUM BROMIDE 0.02 % IN SOLN
0.5000 mg | RESPIRATORY_TRACT | Status: AC
  Administered 2012-12-31: 0.5 mg via RESPIRATORY_TRACT

## 2012-12-31 NOTE — ED Notes (Signed)
Pt presents with asthma exacerbation.  Has been using inhaler and breathing tx with no relief.  Auditory wheezing noted.  Sats of 100 with labored breathing.

## 2012-12-31 NOTE — ED Notes (Signed)
Called for dinner tray 

## 2012-12-31 NOTE — H&P (Signed)
[facility] History and Physical  Hannah Baldwin WUJ:[id number] DOB: 07/09/1971 DOA: 12/31/2012  Referring physician: ED  PCP: no pcp    Chief Complaint:  Chief Complaint  Patient presents with  . Shortness of Breath    asthma     HPI: Hannah Baldwin is a 42 y.o. female with history of reactive airway, who presents to the emergency room today with one-week of worsening dyspnea, wheezing, coughing, shortness of breath with minimal activity. She was in the emergency room 3 days ago and received albuterol treatment and prednisone pack and today she noticed that she was already worse as soon as she finished albuterol and prednisone from home. She reports also some fevers and chills prior to the visit on Saturday to the ER. She denies any exposure to allergens. She has never been intubated or hospitalized before. She has never had pulmonary function testing before.    Review of Systems: The patient denies anorexia,weight loss,, vision loss, decreased hearing, hoarseness, chest pain, syncope,  peripheral edema, balance deficits, hemoptysis, abdominal pain, melena, hematochezia, severe indigestion/heartburn, hematuria, incontinence, genital sores, muscle weakness, suspicious skin lesions, transient blindness, difficulty walking, depression, unusual weight change, abnormal bleeding, enlarged lymph nodes, angioedema, and breast masses.    Past Medical History  Diagnosis Date  . Asthma    Past Surgical History  Procedure Laterality Date  . Cesarean section     Social History:  reports that she has been smoking Cigarettes.  She has been smoking about 0.50 packs per day. She does not have any smokeless tobacco history on file. She reports that she does not drink alcohol or use illicit drugs.   No Known Allergies  Family History  Problem Relation Age of Onset  . Asthma Mother   . Asthma Daughter      Prior to Admission medications   Medication Sig Start Date End Date Taking?  Authorizing Provider  acetaminophen (TYLENOL) 500 MG tablet Take 1,000 mg by mouth every 6 (six) hours as needed for pain.   Yes Historical Provider, MD  albuterol (PROVENTIL HFA;VENTOLIN HFA) 108 (90 BASE) MCG/ACT inhaler Inhale 1-2 puffs into the lungs every 6 (six) hours as needed for wheezing. 09/30/12  Yes Celene Kras, MD  predniSONE (DELTASONE) 10 MG tablet Take 4 tablets (40 mg total) by mouth daily. 12/27/12  Yes Doug Sou, MD   Physical Exam: Filed Vitals:   12/31/12 1442 12/31/12 1530 12/31/12 1606 12/31/12 1715  BP:    144/78  Pulse:    89  Temp:  97.3 F (36.3 C)    TempSrc:  Oral    Resp:    16  SpO2: 100%  100% 100%     General:  Alert and oriented x3  Eyes: Pupil equal round and reactive to light and accommodation  ENT: Clear pharynx without exudates, normal appearing external ears  Neck: No JVD  Cardiovascular: Regular rate and rhythm without murmur subscales  Respiratory: Widespread wheezes, no rhonchi no crackles  Abdomen: Soft nontender bowel sounds are present  Skin: Warm dry without rashes  Musculoskeletal: Intact muscle bulk and tone  Psychiatric: Euthymic  Neurologic: Cranial nerves 2-12 intact, strength 5 out of 5 in all 4 extremities, sensation intact  Labs on Admission:  Basic Metabolic Panel:  Recent Labs Lab 12/31/12 1515  NA 141  K 3.7  CL 105  GLUCOSE 135*  BUN 11  CREATININE 0.80   Liver Function Tests: No results found for this basename: AST, ALT, ALKPHOS, BILITOT, PROT,  ALBUMIN,  in the last 168 hours No results found for this basename: LIPASE, AMYLASE,  in the last 168 hours No results found for this basename: AMMONIA,  in the last 168 hours CBC:  Recent Labs Lab 12/31/12 1457 12/31/12 1515  WBC 5.8  --   NEUTROABS 3.4  --   HGB 9.1* 11.6*  HCT 32.5* 34.0*  MCV 73.2*  --   PLT 452*  --    Cardiac Enzymes: No results found for this basename: CKTOTAL, CKMB, CKMBINDEX, TROPONINI,  in the last 168 hours  BNP  (last 3 results) No results found for this basename: PROBNP,  in the last 8760 hours CBG: No results found for this basename: GLUCAP,  in the last 168 hours  Radiological Exams on Admission: Dg Chest 2 View (if Patient Has Fever And/or Copd)  12/31/2012  *RADIOLOGY REPORT*  Clinical Data: Shortness of breath, myalgia  CHEST - 2 VIEW  Comparison: 12/27/2012  Findings: Cardiomediastinal silhouette is within normal limits. The lungs are clear. No pleural effusion.  No pneumothorax.  No acute osseous abnormality.  Mild prominence of the cardiac silhouette is noted, likely in part due to hypoaeration.  IMPRESSION: No acute abnormality.  Borderline cardiomegaly.   Original Report Authenticated By: Christiana Pellant, M.D.       Assessment/Plan Principal Problem:   Asthma exacerbation   1. Asthma exacerbation-most likely brought on by an episode of upper respiratory tract infection. Patient has been ill now for greater than a week so this point in time she'll probably benefit from hospitalization for observation, IV steroids, IV magnesium sulfate, oral antibiotics, alongside nebulizer albuterol.   Code Status: full  Family Communication: patient  Disposition Plan: home   Meleah Demeyer [facility] Pager [phone]  If 7PM-7AM, please contact night-coverage [email or web address] Password [facility] 12/31/2012, 5:55 PM

## 2012-12-31 NOTE — ED Provider Notes (Signed)
History     CSN: [id number]  Arrival date & time 12/31/12  1258   First MD Initiated Contact with Patient 12/31/12 1414      Chief Complaint  Patient presents with  . Shortness of Breath    asthma    (Consider location/radiation/quality/duration/timing/severity/associated sxs/prior treatment) HPI Complains of wheezing onset approximate 5 days ago. Patient seen here 12/27/2012 for same complaint treated in the emergency department without much improved on discharge prescribed prednisone which she has been compliant with. Breathing became worse today. She felt that he had improved until today. She sees her inhaler every 4 hours today with transient relief. Patient continues to cough, nonproductive. She has not smoked since discharge from the emergency department 03/22. Nothing makes symptoms better or worse. Past Medical History  Diagnosis Date  . Asthma     Past Surgical History  Procedure Laterality Date  . Cesarean section      No family history on file.  History  Substance Use Topics  . Smoking status: Current Every Day Smoker -- 0.50 packs/day    Types: Cigarettes  . Smokeless tobacco: Not on file  . Alcohol Use: Yes     Comment: occasionally     OB History   Grav Para Term Preterm Abortions TAB SAB Ect Mult Living                  Review of Systems  Constitutional: Negative.   HENT: Negative.   Respiratory: Positive for cough, shortness of breath and wheezing.   Cardiovascular: Negative.   Gastrointestinal: Negative.   Musculoskeletal: Negative.   Skin: Negative.   Neurological: Negative.   Psychiatric/Behavioral: Negative.   All other systems reviewed and are negative.    Allergies  Review of patient's allergies indicates no known allergies.  Home Medications   Current Outpatient Rx  Name  Route  Sig  Dispense  Refill  . acetaminophen (TYLENOL) 500 MG tablet   Oral   Take 1,000 mg by mouth every 6 (six) hours as needed for pain.         Marland Kitchen  albuterol (PROVENTIL HFA;VENTOLIN HFA) 108 (90 BASE) MCG/ACT inhaler   Inhalation   Inhale 1-2 puffs into the lungs every 6 (six) hours as needed for wheezing.         . predniSONE (DELTASONE) 10 MG tablet   Oral   Take 4 tablets (40 mg total) by mouth daily.   15 tablet   0     BP 142/76  Pulse 69  Temp(Src) 97.7 F (36.5 C) (Oral)  Resp 22  SpO2 100%  LMP 12/31/2012  Physical Exam  Nursing note and vitals reviewed. Constitutional: She appears well-developed and well-nourished.  HENT:  Head: Normocephalic and atraumatic.  Eyes: Conjunctivae are normal. Pupils are equal, round, and reactive to light.  Neck: Neck supple. No tracheal deviation present. No thyromegaly present.  Cardiovascular: Normal rate and regular rhythm.   No murmur heard. Pulmonary/Chest: She has wheezes.  Prolonged expiratory phase with expiratory wheezes, cough occasionally  Abdominal: Soft. Bowel sounds are normal. She exhibits no distension. There is no tenderness.  Morbidly obese  Musculoskeletal: Normal range of motion. She exhibits no edema and no tenderness.  Neurological: She is alert. Coordination normal.  Skin: Skin is warm and dry. No rash noted.  Psychiatric: She has a normal mood and affect.    ED Course  Procedures (including critical care time)  Labs Reviewed  CBC WITH DIFFERENTIAL   No results found.  No diagnosis found. Chest x-ray viewed by me Patient placed on wheeze protocol. 5:25 PM patient breathing is not at baseline after 2 hours of continuous nebulization. She speaks sentences. Lungs with prolonged expiratory phase expiratory wheezes she's improved over initial presentation. But not at baseline MDM   Spoke with Dr. Lavera Guise Plan med surge floor 23 hour observation Diagnosis #1 status asthmaticus #2 hyperglycemia         Doug Sou, MD 12/31/12 1735

## 2013-01-01 DIAGNOSIS — Z87891 Personal history of nicotine dependence: Secondary | ICD-10-CM | POA: Diagnosis present

## 2013-01-01 DIAGNOSIS — F17200 Nicotine dependence, unspecified, uncomplicated: Secondary | ICD-10-CM

## 2013-01-01 MED ORDER — ALBUTEROL SULFATE (5 MG/ML) 0.5% IN NEBU
2.5000 mg | INHALATION_SOLUTION | RESPIRATORY_TRACT | Status: DC
  Administered 2013-01-01 – 2013-01-02 (×5): 2.5 mg via RESPIRATORY_TRACT
  Filled 2013-01-01 (×5): qty 0.5

## 2013-01-01 MED ORDER — NICOTINE 14 MG/24HR TD PT24
14.0000 mg | MEDICATED_PATCH | Freq: Every day | TRANSDERMAL | Status: DC
  Administered 2013-01-01 – 2013-01-03 (×3): 14 mg via TRANSDERMAL
  Filled 2013-01-01 (×3): qty 1

## 2013-01-01 MED ORDER — LEVOFLOXACIN IN D5W 500 MG/100ML IV SOLN
500.0000 mg | INTRAVENOUS | Status: DC
  Administered 2013-01-01 – 2013-01-03 (×3): 500 mg via INTRAVENOUS
  Filled 2013-01-01 (×3): qty 100

## 2013-01-01 MED ORDER — METHYLPREDNISOLONE SODIUM SUCC 125 MG IJ SOLR
80.0000 mg | Freq: Two times a day (BID) | INTRAMUSCULAR | Status: DC
  Administered 2013-01-01 – 2013-01-02 (×3): 80 mg via INTRAVENOUS
  Filled 2013-01-01: qty 1.28
  Filled 2013-01-01: qty 2
  Filled 2013-01-01 (×3): qty 1.28

## 2013-01-01 MED ORDER — IPRATROPIUM BROMIDE 0.02 % IN SOLN: 0.5000 mg | RESPIRATORY_TRACT | Status: DC

## 2013-01-01 MED ORDER — GUAIFENESIN ER 600 MG PO TB12
1200.0000 mg | ORAL_TABLET | Freq: Two times a day (BID) | ORAL | Status: DC
  Administered 2013-01-01 – 2013-01-03 (×5): 1200 mg via ORAL
  Filled 2013-01-01 (×6): qty 2

## 2013-01-01 MED ORDER — IPRATROPIUM BROMIDE 0.02 % IN SOLN
0.5000 mg | RESPIRATORY_TRACT | Status: DC
  Administered 2013-01-01 – 2013-01-02 (×5): 0.5 mg via RESPIRATORY_TRACT
  Filled 2013-01-01 (×5): qty 2.5

## 2013-01-01 MED ORDER — ZOLPIDEM TARTRATE 5 MG PO TABS
5.0000 mg | ORAL_TABLET | Freq: Every evening | ORAL | Status: DC | PRN
  Administered 2013-01-01 – 2013-01-02 (×2): 5 mg via ORAL
  Filled 2013-01-01 (×2): qty 1

## 2013-01-01 MED ORDER — ALBUTEROL SULFATE (5 MG/ML) 0.5% IN NEBU: 2.5000 mg | INHALATION_SOLUTION | RESPIRATORY_TRACT | Status: DC

## 2013-01-01 NOTE — Progress Notes (Signed)
Pre/Post Peak Flow 150/160

## 2013-01-01 NOTE — Progress Notes (Signed)
-   I have review the data and agree with plan as above. - Cont steroids at a higher dose and INhalers.

## 2013-01-01 NOTE — Progress Notes (Signed)
[facility] PROGRESS NOTE  Hannah Baldwin ZOX:[id number] DOB: April 09, 1971 DOA: 12/31/2012 PCP: Default, Provider, MD  Assessment/Plan:  Asthma exacerbation Most likely brought on by viral respiratory tract infection. Treating with IV Solumedrol, scheduled nebs, IV levaquin, Flutter Valve Will check 2 D echo  Tobacco abuse Patient commits to quitting.  Obesity. ?OSA.  Will work with patient to obtain a PCP and establish care.    DVT Prophylaxis:    Code Status: full Family Communication: family at bedside Disposition Plan: inpatient    Consultants:   Procedures:  2D echo pending  Antibiotics:  levaquin   HPI/Subjective: The patient reports she is a 1/2 pack a day smoker.  She rarely has symptoms like this.  She came to the ED for chest tightness and wheezing  Objective: Filed Vitals:   12/31/12 2113 01/01/13 0144 01/01/13 0512 01/01/13 0821  BP:   152/92   Pulse: 78  64   Temp:   98 F (36.7 C)   TempSrc:   Oral   Resp: 18 18 20    Height:      Weight:      SpO2: 98%  100% 98%    Intake/Output Summary (Last 24 hours) at 01/01/13 1322 Last data filed at 01/01/13 0500  Gross per 24 hour  Intake    360 ml  Output      2 ml  Net    358 ml   Filed Weights   12/31/12 2100  Weight: 143.7 kg (316 lb 12.8 oz)    Exam:   General:  Obese, female, A&O, unable to finish a sentence without gasping.  Cardiovascular: RRR, no murmurs, rubs or gallops, no lower extremity edema  Respiratory: Bilateral expiratory wheeze, mild to moderate work of breathing.  Abdomen: Soft, non-tender, non-distended, + bowel sounds, no masses  Musculoskeletal: Able to move all 4 extremities, 5/5 strength in each  Data Reviewed: Basic Metabolic Panel:  Recent Labs Lab 12/31/12 1515  NA 141  K 3.7  CL 105  GLUCOSE 135*  BUN 11  CREATININE 0.80   CBC:  Recent Labs Lab 12/31/12 1457 12/31/12 1515  WBC 5.8  --   NEUTROABS 3.4  --   HGB 9.1* 11.6*  HCT  32.5* 34.0*  MCV 73.2*  --   PLT 452*  --     Studies: Dg Chest 2 View (if Patient Has Fever And/or Copd)  12/31/2012  *RADIOLOGY REPORT*  Clinical Data: Shortness of breath, myalgia  CHEST - 2 VIEW  Comparison: 12/27/2012  Findings: Cardiomediastinal silhouette is within normal limits. The lungs are clear. No pleural effusion.  No pneumothorax.  No acute osseous abnormality.  Mild prominence of the cardiac silhouette is noted, likely in part due to hypoaeration.  IMPRESSION: No acute abnormality.  Borderline cardiomegaly.   Original Report Authenticated By: Christiana Pellant, M.D.     Scheduled Meds: . ipratropium  0.5 mg Nebulization Q4H   And  . albuterol  2.5 mg Nebulization Q4H  . enoxaparin (LOVENOX) injection  40 mg Subcutaneous Q24H  . guaiFENesin  1,200 mg Oral BID  . levofloxacin (LEVAQUIN) IV  500 mg Intravenous Q24H  . methylPREDNISolone (SOLU-MEDROL) injection  80 mg Intravenous Q12H  . nicotine  14 mg Transdermal Daily  . pantoprazole  40 mg Oral Q1200   Continuous Infusions:   Principal Problem:   Asthma exacerbation Active Problems:   Tobacco use disorder   Morbid obesity    Conley Canal  [facility] Pager [phone]. If  7PM-7AM, please contact night-coverage at [email or web address], password [facility] 01/01/2013, 1:22 PM  LOS: 1 day

## 2013-01-01 NOTE — Progress Notes (Signed)
NURSING PROGRESS NOTE  BRIANN SARCHET [id number] Admission Data: 01/01/2013 1:27 AM Attending Provider: Lorane Gell, MD [id number], Provider, MD Code Status: full  Hannah Baldwin is a 42 y.o. female patient admitted from ED:  -No acute distress noted.  -No complaints of shortness of breath.  -No complaints of chest pain.   Cardiac Monitoring: None  Blood pressure 138/85, pulse 78, temperature 98.3 F (36.8 C), temperature source Oral, resp. rate 18, height 5\' 3"  (1.6 m), weight 143.7 kg (316 lb 12.8 oz), last menstrual period 12/31/2012, SpO2 98.00%.   IV Fluids:  IV in place, occlusive dsg intact without redness, IV cath antecubital left, condition patent and no redness none.   Allergies:  Review of patient's allergies indicates no known allergies.  Past Medical History:   has a past medical history of Asthma.  Past Surgical History:   has past surgical history that includes Cesarean section.  Social History:   reports that she has been smoking Cigarettes.  She has been smoking about 0.50 packs per day. She does not have any smokeless tobacco history on file. She reports that she does not drink alcohol or use illicit drugs.  Skin: WDL  Patient/Family orientated to room. Information packet given to patient/family. Admission inpatient armband information verified with patient/family to include name and date of birth and placed on patient arm. Side rails up x 2, fall assessment and education completed with patient/family. Patient/family able to verbalize understanding of risk associated with falls and verbalized understanding to call for assistance before getting out of bed. Call light within reach. Patient/family able to voice and demonstrate understanding of unit orientation instructions.    Will continue to evaluate and treat per MD orders.

## 2013-01-01 NOTE — Care Management Note (Unsigned)
    Page 1 of 1   01/01/2013     3:50:42 PM   CARE MANAGEMENT NOTE 01/01/2013  Patient:  Hannah Baldwin, Hannah Baldwin   Account Number:  [id number]  Date Initiated:  01/01/2013  Documentation initiated by:  Letha Cape  Subjective/Objective Assessment:   dx asthma ex  admit- lives alone, pta indep.     Action/Plan:   Anticipated DC Date:  01/03/2013   Anticipated DC Plan:  HOME/SELF CARE      DC Planning Services  CM consult      Choice offered to / List presented to:             Status of service:  In process, will continue to follow Medicare Important Message given?   (If response is "NO", the following Medicare IM given date fields will be blank) Date Medicare IM given:   Date Additional Medicare IM given:    Discharge Disposition:    Per UR Regulation:  Reviewed for med. necessity/level of care/duration of stay  If discussed at Long Length of Stay Meetings, dates discussed:    Comments:  01/01/13 15:27 Letha Cape RN, BSN [phone] patient lives alone, pta indep.  Patient has trasnportation at discharge. Patient states she will need ast with medications, will ast her with [facility].  Patient states that someone from financial counseling came up to see her when she was in the ED.  If she is on any inhalers , please give her the ones she is already using.  NCM will contine to follow.

## 2013-01-01 NOTE — Progress Notes (Signed)
*  PRELIMINARY RESULTS* Echocardiogram 2D Echocardiogram has been performed.  Jeryl Columbia 01/01/2013, 4:40 PM

## 2013-01-02 MED ORDER — HYDROCOD POLST-CHLORPHEN POLST 10-8 MG/5ML PO LQCR
5.0000 mL | Freq: Every day | ORAL | Status: DC
  Administered 2013-01-02: 5 mL via ORAL
  Filled 2013-01-02: qty 5

## 2013-01-02 MED ORDER — ALBUTEROL SULFATE (5 MG/ML) 0.5% IN NEBU
2.5000 mg | INHALATION_SOLUTION | RESPIRATORY_TRACT | Status: DC
  Administered 2013-01-02 (×3): 2.5 mg via RESPIRATORY_TRACT
  Filled 2013-01-02 (×3): qty 0.5

## 2013-01-02 MED ORDER — WHITE PETROLATUM GEL
Status: AC
  Administered 2013-01-02: 15:00:00
  Filled 2013-01-02: qty 5

## 2013-01-02 MED ORDER — PREDNISONE 50 MG PO TABS
60.0000 mg | ORAL_TABLET | Freq: Every day | ORAL | Status: DC
  Administered 2013-01-03: 60 mg via ORAL
  Filled 2013-01-02 (×2): qty 1

## 2013-01-02 MED ORDER — ALBUTEROL SULFATE HFA 108 (90 BASE) MCG/ACT IN AERS
2.0000 | INHALATION_SPRAY | RESPIRATORY_TRACT | Status: DC | PRN
  Filled 2013-01-02 (×2): qty 6.7

## 2013-01-02 MED ORDER — ALBUTEROL SULFATE (5 MG/ML) 0.5% IN NEBU
2.5000 mg | INHALATION_SOLUTION | Freq: Four times a day (QID) | RESPIRATORY_TRACT | Status: DC
  Administered 2013-01-03 (×2): 2.5 mg via RESPIRATORY_TRACT
  Filled 2013-01-02 (×2): qty 0.5

## 2013-01-02 MED ORDER — IPRATROPIUM BROMIDE 0.02 % IN SOLN
0.5000 mg | Freq: Four times a day (QID) | RESPIRATORY_TRACT | Status: DC
  Administered 2013-01-03 (×2): 0.5 mg via RESPIRATORY_TRACT
  Filled 2013-01-02 (×2): qty 2.5

## 2013-01-02 MED ORDER — IPRATROPIUM BROMIDE 0.02 % IN SOLN
0.5000 mg | Freq: Four times a day (QID) | RESPIRATORY_TRACT | Status: DC
  Administered 2013-01-02 (×2): 0.5 mg via RESPIRATORY_TRACT
  Filled 2013-01-02 (×2): qty 2.5

## 2013-01-02 NOTE — Progress Notes (Signed)
Answered pt call bell and heard yelling and screaming. I went to room and asked if she needed assistance or for me to put a no visitors sign on her door. I then proceeded to tell her that this was a professional environment and she couldn't be yelling because it was disturbing other patients. She started yelling and screaming at me so I told her I was leaving. I then shut the door. Notified the charge Teacher, early years/pre.

## 2013-01-02 NOTE — Progress Notes (Signed)
[facility] PROGRESS NOTE  Hannah Baldwin WUJ:[id number] DOB: 1971/02/01 DOA: 12/31/2012 PCP: Default, Provider, MD  Assessment/Plan:  Asthma exacerbation Seems to be improving-lungs with some scattered rhonchi-but gets SOB worse with ambulation Most likely brought on by viral respiratory tract infection. Decrease Solumedrol today, change to prednisone in the am.   Continue levaquin for now. Decrease frequency of Nebs. Hopefully home tomorrow with Inhaler.  Tobacco abuse Patient commits to quitting.  Obesity. ?OSA.  Will work with patient to obtain a PCP and establish care.  DVT Prophylaxis:   Prophylactic Lovenox  Code Status: full Family Communication: family at bedside Disposition Plan: inpatient.  Home 3/29   Consultants:   Procedures:  2D echo unrevealing.normal LVEF w/o wall motion abnormalities  Antibiotics:  levaquin   HPI/Subjective: Improved breathing today.  Still becomes very SOB with minimal exertion (walking to the bathroom)  Objective: Filed Vitals:   01/01/13 2051 01/02/13 0438 01/02/13 0531 01/02/13 1014  BP: 156/94  152/85 132/80  Pulse: 67 69 71 86  Temp: 97.7 F (36.5 C)  97.9 F (36.6 C) 98.2 F (36.8 C)  TempSrc: Oral  Oral Oral  Resp: 20 20 20 20   Height:      Weight:      SpO2: 99% 98% 99% 100%    Intake/Output Summary (Last 24 hours) at 01/02/13 1053 Last data filed at 01/02/13 1000  Gross per 24 hour  Intake    480 ml  Output      2 ml  Net    478 ml   Filed Weights   12/31/12 2100  Weight: 143.7 kg (316 lb 12.8 oz)    Exam:   General:  Obese, female, A&O, NAD  Cardiovascular: RRR, no murmurs, rubs or gallops, no lower extremity edema  Respiratory: Bilateral expiratory wheeze, decreased work of breathing compared to yesterday.  Abdomen: Soft, non-tender, non-distended, + bowel sounds, no masses  Musculoskeletal: Able to move all 4 extremities, 5/5 strength in each  Data Reviewed: Basic Metabolic  Panel:  Recent Labs Lab 12/31/12 1515  NA 141  K 3.7  CL 105  GLUCOSE 135*  BUN 11  CREATININE 0.80   CBC:  Recent Labs Lab 12/31/12 1457 12/31/12 1515  WBC 5.8  --   NEUTROABS 3.4  --   HGB 9.1* 11.6*  HCT 32.5* 34.0*  MCV 73.2*  --   PLT 452*  --     Studies: Dg Chest 2 View (if Patient Has Fever And/or Copd)  12/31/2012  *RADIOLOGY REPORT*  Clinical Data: Shortness of breath, myalgia  CHEST - 2 VIEW  Comparison: 12/27/2012  Findings: Cardiomediastinal silhouette is within normal limits. The lungs are clear. No pleural effusion.  No pneumothorax.  No acute osseous abnormality.  Mild prominence of the cardiac silhouette is noted, likely in part due to hypoaeration.  IMPRESSION: No acute abnormality.  Borderline cardiomegaly.   Original Report Authenticated By: Christiana Pellant, M.D.     Scheduled Meds: . ipratropium  0.5 mg Nebulization Q6H   And  . albuterol  2.5 mg Nebulization Q4H  . enoxaparin (LOVENOX) injection  40 mg Subcutaneous Q24H  . guaiFENesin  1,200 mg Oral BID  . levofloxacin (LEVAQUIN) IV  500 mg Intravenous Q24H  . nicotine  14 mg Transdermal Daily  . pantoprazole  40 mg Oral Q1200  . [START ON 01/03/2013] predniSONE  60 mg Oral Q breakfast   Continuous Infusions:   Principal Problem:   Asthma exacerbation Active Problems:   Tobacco use  disorder   Morbid obesity    Conley Canal  [facility] Pager [phone]. If 7PM-7AM, please contact night-coverage at [email or web address], password [facility] 01/02/2013, 10:53 AM  LOS: 2 days   Attending I have seen and examined the patient, agree with the assessment and plan. Slowly improving, anticipate discharge home in am  S Ghimire

## 2013-01-03 MED ORDER — PREDNISONE 10 MG PO TABS: ORAL_TABLET | ORAL | Status: DC

## 2013-01-03 MED ORDER — ALBUTEROL SULFATE HFA 108 (90 BASE) MCG/ACT IN AERS: 2.0000 | INHALATION_SPRAY | RESPIRATORY_TRACT | Status: DC | PRN

## 2013-01-03 MED ORDER — MOMETASONE FURO-FORMOTEROL FUM 100-5 MCG/ACT IN AERO: 2.0000 | INHALATION_SPRAY | Freq: Two times a day (BID) | RESPIRATORY_TRACT | Status: DC

## 2013-01-03 MED ORDER — HYDROCOD POLST-CHLORPHEN POLST 10-8 MG/5ML PO LQCR: 5.0000 mL | Freq: Every evening | ORAL | Status: DC | PRN

## 2013-01-03 MED ORDER — MOMETASONE FURO-FORMOTEROL FUM 100-5 MCG/ACT IN AERO
2.0000 | INHALATION_SPRAY | Freq: Two times a day (BID) | RESPIRATORY_TRACT | Status: DC
  Filled 2013-01-03 (×2): qty 8.8

## 2013-01-03 NOTE — Discharge Summary (Signed)
PATIENT DETAILS Name: Hannah Baldwin Age: 42 y.o. Sex: female Date of Birth: 05/24/1971 MRN: [id number]. Admit Date: 12/31/2012 Admitting Physician: Lorane Gell, MD [id number], Provider, MD  Recommendations for Outpatient Follow-up:  Optimize long-term asthma control  PRIMARY DISCHARGE DIAGNOSIS:  Principal Problem:   Asthma exacerbation Active Problems:   Tobacco use disorder   Morbid obesity      PAST MEDICAL HISTORY: Past Medical History  Diagnosis Date  . Asthma     DISCHARGE MEDICATIONS:   Medication List    STOP taking these medications       acetaminophen 500 MG tablet  Commonly known as:  TYLENOL      TAKE these medications       albuterol 108 (90 BASE) MCG/ACT inhaler  Commonly known as:  PROVENTIL HFA;VENTOLIN HFA  Inhale 2 puffs into the lungs every 4 (four) hours as needed for wheezing or shortness of breath.     chlorpheniramine-HYDROcodone 10-8 MG/5ML Lqcr  Commonly known as:  TUSSIONEX  Take 5 mLs by mouth at bedtime as needed.     mometasone-formoterol 100-5 MCG/ACT Aero  Commonly known as:  DULERA  Inhale 2 puffs into the lungs 2 (two) times daily.     predniSONE 10 MG tablet  Commonly known as:  DELTASONE  Take 4 tablets daily for 3 days, then,   Take 3 tablets daily for 3 days, then,  Take 2 tablets daily for 3 days, then  Take 1 tablet daily for 1 day and then stop         BRIEF HPI:  See H&P, Labs, Consult and Test reports for all details in brief, patient is a morbidly obese 42 year old [place] American female with a history of reactive airway disease presented with cough wheezing. She was found to have a exacerbation of underlying asthma and admitted to the hospitalist service  CONSULTATIONS:   None  PERTINENT RADIOLOGIC STUDIES: Dg Chest 2 View (if Patient Has Fever And/or Copd)  12/31/2012  *RADIOLOGY REPORT*  Clinical Data: Shortness of breath, myalgia  CHEST - 2 VIEW  Comparison: 12/27/2012  Findings: Cardiomediastinal  silhouette is within normal limits. The lungs are clear. No pleural effusion.  No pneumothorax.  No acute osseous abnormality.  Mild prominence of the cardiac silhouette is noted, likely in part due to hypoaeration.  IMPRESSION: No acute abnormality.  Borderline cardiomegaly.   Original Report Authenticated By: Christiana Pellant, M.D.    Dg Chest 2 View (if Patient Has Fever And/or Copd)  12/27/2012  *RADIOLOGY REPORT*  Clinical Data: Cough, shortness of breath, chest pain due to cough  CHEST - 2 VIEW  Comparison: 09/30/1929  Findings: Pain mild cardiac enlargement stable.  Vascular pattern normal.  Lungs clear.  No pleural effusion.  IMPRESSION: No acute findings.   Original Report Authenticated By: Esperanza Heir, M.D.      PERTINENT LAB RESULTS: CBC:  Recent Labs  12/31/12 1457 12/31/12 1515  WBC 5.8  --   HGB 9.1* 11.6*  HCT 32.5* 34.0*  PLT 452*  --    CMET CMP     Component Value Date/Time   NA 141 12/31/2012 1515   K 3.7 12/31/2012 1515   CL 105 12/31/2012 1515   CO2 25 09/30/2012 1600   GLUCOSE 135* 12/31/2012 1515   BUN 11 12/31/2012 1515   CREATININE 0.80 12/31/2012 1515   CALCIUM 9.3 09/30/2012 1600   GFRNONAA 86* 09/30/2012 1600   GFRAA >90 09/30/2012 1600    GFR Estimated Creatinine Clearance: 131.2  ml/min (by C-G formula based on Cr of 0.8). No results found for this basename: LIPASE, AMYLASE,  in the last 72 hours No results found for this basename: CKTOTAL, CKMB, CKMBINDEX, TROPONINI,  in the last 72 hours No components found with this basename: POCBNP,  No results found for this basename: DDIMER,  in the last 72 hours No results found for this basename: HGBA1C,  in the last 72 hours No results found for this basename: CHOL, HDL, LDLCALC, TRIG, CHOLHDL, LDLDIRECT,  in the last 72 hours No results found for this basename: TSH, T4TOTAL, FREET3, T3FREE, THYROIDAB,  in the last 72 hours No results found for this basename: VITAMINB12, FOLATE, FERRITIN, TIBC, IRON,  RETICCTPCT,  in the last 72 hours Coags: No results found for this basename: PT, INR,  in the last 72 hours Microbiology: No results found for this or any previous visit (from the past 240 hour(s)).   BRIEF HOSPITAL COURSE:   Principal Problem:   Asthma exacerbation Patient was admitted, started on nebulized bronchodilators, IV Solu-Medrol and empiric Levaquin. She was also given supportive care with antitussives medications. With these measures, she slowly started improving , her dyspnea got significantly better. By the day of discharge her lungs were clear to exam, her shortness of breath with Ambulation improved significantly as well. This morning during my rounds, patient is all dressed up and wait to be discharged, and was requesting discharge as well. She will be discharged on a rescue inhaler in the form of an albuterol inhaler, and a maintenance inhaler-Dulera. She's been asked to completely stop using tobacco.  Active Problems:   Tobacco use disorder - She has been counseled extensively about the importance of completely stopping tobacco use. Inpatient stay she was provided with nicotine transdermally.     Morbid obesity  - She has been counseled extensively about the importance of weight loss  TODAY-DAY OF DISCHARGE:  Subjective:   Hannah Baldwin today has no headache,no chest abdominal pain,no new weakness tingling or numbness, feels much better wants to go home today.  Objective:   Blood pressure 148/91, pulse 68, temperature 97.9 F (36.6 C), temperature source Oral, resp. rate 18, height 5\' 3"  (1.6 m), weight 143.7 kg (316 lb 12.8 oz), last menstrual period 12/31/2012, SpO2 98.00%.  Intake/Output Summary (Last 24 hours) at 01/03/13 0915 Last data filed at 01/02/13 1900  Gross per 24 hour  Intake    920 ml  Output      3 ml  Net    917 ml    Exam Awake Alert, Oriented *3, No new F.N deficits, Normal affect [place].AT,PERRAL Supple Neck,No JVD, No cervical  lymphadenopathy appriciated.  Symmetrical Chest wall movement, Good air movement bilaterally, CTAB RRR,No Gallops,Rubs or new Murmurs, No Parasternal Heave +ve B.Sounds, Abd Soft, Non tender, No organomegaly appriciated, No rebound -guarding or rigidity. No Cyanosis, Clubbing or edema, No new Rash or bruise  DISCHARGE CONDITION: Stable  DISPOSITION:  HOME  DISCHARGE INSTRUCTIONS:    Activity:  As tolerated   Diet recommendation: Regular Diet       Follow-up Information   Follow up with [facility] On 01/08/2013. (12 noon , bring $20 , photo id and medications)    Contact information:   [address] [place] [address] [phone]     Total Time spent on discharge equals 45 minutes.  SignedJeoffrey Massed 01/03/2013 9:15 AM

## 2013-01-03 NOTE — Progress Notes (Signed)
01/03/13 Patient discharged home today. IV site removed, discharge instructions reviewed with patient. Taken to parking lot to get car from [employer].

## 2013-01-08 ENCOUNTER — Encounter (HOSPITAL_COMMUNITY): Payer: Self-pay | Admitting: Emergency Medicine

## 2013-01-08 ENCOUNTER — Emergency Department (HOSPITAL_COMMUNITY)
Admission: EM | Admit: 2013-01-08 | Discharge: 2013-01-08 | Disposition: A | Discharge status: Home Health | Payer: Self-pay | Source: Home / Self Care

## 2013-01-08 DIAGNOSIS — J45901 Unspecified asthma with (acute) exacerbation: Secondary | ICD-10-CM

## 2013-01-08 DIAGNOSIS — F17200 Nicotine dependence, unspecified, uncomplicated: Secondary | ICD-10-CM

## 2013-01-08 MED ORDER — CETIRIZINE HCL 10 MG PO CAPS: 1.0000 | ORAL_CAPSULE | Freq: Every evening | ORAL | Status: DC | PRN

## 2013-01-08 NOTE — ED Notes (Signed)
Pt is here for a f/u from [facility] ER for asthma.  Given Prednisone, Tussinex and albuterol for asthma Pt reports feeling much better but still will have rattling of chest  She is alert and oriented w/no signs of acute respiratory distress

## 2013-01-08 NOTE — ED Notes (Signed)
Patient Demographics  Hannah Baldwin, is a 42 y.o. female  ZOX:[id number]  WJX:[id number]  DOB - [birth year]  Chief Complaint  Patient presents with  . Follow-up  . Asthma        Subjective:   Hannah Baldwin morbidly obese young [place] American female with history of smoking, who was recently hospitalized for asthma exacerbation, is to establish care. She is symptom free, shortness of breath has resolved completely, no wheezing, denies fever chills, no cough, no abdominal pain or diarrhea.  Objective:   Past Medical History  Diagnosis Date  . Asthma       Past Surgical History  Procedure Laterality Date  . Cesarean section       Filed Vitals:   01/08/13 1257  BP: 135/75  Pulse: 75  Temp: 98 F (36.7 C)  TempSrc: Oral  SpO2: 100%     Exam  Awake Alert, Oriented X 3, No new F.N deficits, Normal affect [place].AT,PERRAL Supple Neck,No JVD, No cervical lymphadenopathy appriciated.  Symmetrical Chest wall movement, Good air movement bilaterally, CTAB RRR,No Gallops,Rubs or new Murmurs, No Parasternal Heave +ve B.Sounds, Abd Soft, Non tender, No organomegaly appriciated, No rebound - guarding or rigidity. No Cyanosis, Clubbing or edema, No new Rash or bruise       Data Review   CBC No results found for this basename: WBC, HGB, HCT, PLT, MCV, MCH, MCHC, RDW, NEUTRABS, LYMPHSABS, MONOABS, EOSABS, BASOSABS, BANDABS, BANDSABD,  in the last 168 hours  Chemistries   No results found for this basename: NA, K, CL, CO2, GLUCOSE, BUN, CREATININE, GFRCGP, CALCIUM, MG, AST, ALT, ALKPHOS, BILITOT,  in the last 168 hours ------------------------------------------------------------------------------------------------------------------ No results found for this basename: HGBA1C,  in the last 72 hours ------------------------------------------------------------------------------------------------------------------ No results found for this basename: CHOL, HDL, LDLCALC, TRIG,  CHOLHDL, LDLDIRECT,  in the last 72 hours ------------------------------------------------------------------------------------------------------------------ No results found for this basename: TSH, T4TOTAL, FREET3, T3FREE, THYROIDAB,  in the last 72 hours ------------------------------------------------------------------------------------------------------------------ No results found for this basename: VITAMINB12, FOLATE, FERRITIN, TIBC, IRON, RETICCTPCT,  in the last 72 hours  Coagulation profile  No results found for this basename: INR, PROTIME,  in the last 168 hours     Prior to Admission medications   Medication Sig Start Date End Date Taking? Authorizing Provider  albuterol (PROVENTIL HFA;VENTOLIN HFA) 108 (90 BASE) MCG/ACT inhaler Inhale 2 puffs into the lungs every 4 (four) hours as needed for wheezing or shortness of breath. 01/03/13  Yes Shanker Levora Dredge, MD  chlorpheniramine-HYDROcodone (TUSSIONEX) 10-8 MG/5ML LQCR Take 5 mLs by mouth at bedtime as needed. 01/03/13  Yes Shanker Levora Dredge, MD  predniSONE (DELTASONE) 10 MG tablet Take 4 tablets daily for 3 days, then,  Take 3 tablets daily for 3 days, then, Take 2 tablets daily for 3 days, then Take 1 tablet daily for 1 day and then stop 01/03/13  Yes Shanker Levora Dredge, MD  Cetirizine HCl (ZYRTEC ALLERGY) 10 MG CAPS Take 1 capsule (10 mg total) by mouth at bedtime and may repeat dose one time if needed. 01/08/13   Leroy Sea, MD  mometasone-formoterol (DULERA) 100-5 MCG/ACT AERO Inhale 2 puffs into the lungs 2 (two) times daily. 01/03/13   Shanker Levora Dredge, MD     Assessment & Plan   Recent asthma exacerbation now stable. Patient will finish her prednisone taper, continue due to along with when necessary nebulizer treatments with albuterol as needed, have given her Zyrtec as she says in this season her asthma acts up due to  pollen allergies. She will come back in a month for routine followup.   Morbid obesity advised on 4-5  times a week 30 minute exercise and weight loss. Heart healthy low carbohydrate diet.    Follow-up Information   Schedule an appointment as soon as possible for a visit in 1 month to follow up.       Leroy Sea M.D on 01/08/2013 at 1:00 PM   Leroy Sea, MD 01/08/13 [phone]

## 2013-02-09 ENCOUNTER — Encounter (HOSPITAL_COMMUNITY): Payer: Self-pay

## 2013-02-09 ENCOUNTER — Emergency Department (HOSPITAL_COMMUNITY)
Admission: EM | Admit: 2013-02-09 | Discharge: 2013-02-09 | Disposition: A | Discharge status: Home / Self Care | Payer: Self-pay | Source: Home / Self Care

## 2013-02-09 NOTE — ED Notes (Signed)
Follow up right leg pain.

## 2014-01-08 ENCOUNTER — Encounter (HOSPITAL_COMMUNITY): Payer: Self-pay | Admitting: Emergency Medicine

## 2014-01-08 ENCOUNTER — Emergency Department (HOSPITAL_COMMUNITY): Payer: Self-pay

## 2014-01-08 ENCOUNTER — Emergency Department (HOSPITAL_COMMUNITY)
Admission: EM | Admit: 2014-01-08 | Discharge: 2014-01-08 | Disposition: A | Discharge status: Home / Self Care | Payer: Self-pay | Attending: Emergency Medicine | Admitting: Emergency Medicine

## 2014-01-08 DIAGNOSIS — IMO0002 Reserved for concepts with insufficient information to code with codable children: Secondary | ICD-10-CM | POA: Insufficient documentation

## 2014-01-08 DIAGNOSIS — J45901 Unspecified asthma with (acute) exacerbation: Secondary | ICD-10-CM | POA: Insufficient documentation

## 2014-01-08 DIAGNOSIS — Z79899 Other long term (current) drug therapy: Secondary | ICD-10-CM | POA: Insufficient documentation

## 2014-01-08 DIAGNOSIS — Y929 Unspecified place or not applicable: Secondary | ICD-10-CM | POA: Insufficient documentation

## 2014-01-08 DIAGNOSIS — D649 Anemia, unspecified: Secondary | ICD-10-CM | POA: Insufficient documentation

## 2014-01-08 DIAGNOSIS — R42 Dizziness and giddiness: Secondary | ICD-10-CM | POA: Insufficient documentation

## 2014-01-08 DIAGNOSIS — F17200 Nicotine dependence, unspecified, uncomplicated: Secondary | ICD-10-CM | POA: Insufficient documentation

## 2014-01-08 DIAGNOSIS — S99929A Unspecified injury of unspecified foot, initial encounter: Secondary | ICD-10-CM

## 2014-01-08 DIAGNOSIS — S8990XA Unspecified injury of unspecified lower leg, initial encounter: Secondary | ICD-10-CM | POA: Insufficient documentation

## 2014-01-08 DIAGNOSIS — S99919A Unspecified injury of unspecified ankle, initial encounter: Secondary | ICD-10-CM

## 2014-01-08 DIAGNOSIS — X500XXA Overexertion from strenuous movement or load, initial encounter: Secondary | ICD-10-CM | POA: Insufficient documentation

## 2014-01-08 DIAGNOSIS — E669 Obesity, unspecified: Secondary | ICD-10-CM | POA: Insufficient documentation

## 2014-01-08 DIAGNOSIS — Y939 Activity, unspecified: Secondary | ICD-10-CM | POA: Insufficient documentation

## 2014-01-08 LAB — CBC
HEMATOCRIT: 32.3 % — AB (ref 36.0–46.0)
Hemoglobin: 9.9 g/dL — ABNORMAL LOW (ref 12.0–15.0)
MCH: 22.6 pg — ABNORMAL LOW (ref 26.0–34.0)
MCHC: 30.7 g/dL (ref 30.0–36.0)
MCV: 73.6 fL — AB (ref 78.0–100.0)
PLATELETS: 561 10*3/uL — AB (ref 150–400)
RBC: 4.39 MIL/uL (ref 3.87–5.11)
RDW: 16.2 % — ABNORMAL HIGH (ref 11.5–15.5)
WBC: 8.2 10*3/uL (ref 4.0–10.5)

## 2014-01-08 LAB — BASIC METABOLIC PANEL
BUN: 10 mg/dL (ref 6–23)
CHLORIDE: 101 meq/L (ref 96–112)
CO2: 23 meq/L (ref 19–32)
CREATININE: 0.75 mg/dL (ref 0.50–1.10)
Calcium: 9.2 mg/dL (ref 8.4–10.5)
GFR calc Af Amer: >90 mL/min (ref >90)
GFR calc non Af Amer: >90 mL/min (ref >90)
Glucose, Bld: 80 mg/dL (ref 70–99)
Potassium: 4.1 mEq/L (ref 3.7–5.3)
Sodium: 138 mEq/L (ref 137–147)

## 2014-01-08 LAB — I-STAT TROPONIN, ED: TROPONIN I, POC: 0 ng/mL (ref 0.00–0.08)

## 2014-01-08 MED ORDER — DEXAMETHASONE SODIUM PHOSPHATE 10 MG/ML IJ SOLN
10.0000 mg | Freq: Once | INTRAMUSCULAR | Status: AC
  Administered 2014-01-08: 10 mg via INTRAVENOUS
  Filled 2014-01-08: qty 1

## 2014-01-08 MED ORDER — ALBUTEROL SULFATE (2.5 MG/3ML) 0.083% IN NEBU: 5.0000 mg | INHALATION_SOLUTION | RESPIRATORY_TRACT | Status: DC | PRN

## 2014-01-08 MED ORDER — IPRATROPIUM BROMIDE 0.02 % IN SOLN
0.5000 mg | Freq: Once | RESPIRATORY_TRACT | Status: AC
  Administered 2014-01-08: 0.5 mg via RESPIRATORY_TRACT
  Filled 2014-01-08: qty 2.5

## 2014-01-08 MED ORDER — ALBUTEROL SULFATE HFA 108 (90 BASE) MCG/ACT IN AERS
2.0000 | INHALATION_SPRAY | Freq: Once | RESPIRATORY_TRACT | Status: AC
  Administered 2014-01-08: 2 via RESPIRATORY_TRACT
  Filled 2014-01-08: qty 6.7

## 2014-01-08 MED ORDER — PREDNISONE 10 MG PO TABS: ORAL_TABLET | ORAL | Status: DC

## 2014-01-08 MED ORDER — ALBUTEROL (5 MG/ML) CONTINUOUS INHALATION SOLN
10.0000 mg/h | INHALATION_SOLUTION | Freq: Once | RESPIRATORY_TRACT | Status: AC
  Administered 2014-01-08: 10 mg/h via RESPIRATORY_TRACT
  Filled 2014-01-08: qty 20

## 2014-01-08 MED ORDER — MAGNESIUM SULFATE 40 MG/ML IJ SOLN
2.0000 g | Freq: Once | INTRAMUSCULAR | Status: AC
  Administered 2014-01-08: 2 g via INTRAVENOUS
  Filled 2014-01-08: qty 50

## 2014-01-08 MED ORDER — ALBUTEROL SULFATE (2.5 MG/3ML) 0.083% IN NEBU
5.0000 mg | INHALATION_SOLUTION | Freq: Once | RESPIRATORY_TRACT | Status: AC
  Administered 2014-01-08: 5 mg via RESPIRATORY_TRACT
  Filled 2014-01-08: qty 6

## 2014-01-08 NOTE — ED Provider Notes (Signed)
CSN: [id number]     Arrival date & time 01/08/14  1243 History   First MD Initiated Contact with Patient 01/08/14 1416     Chief Complaint  Patient presents with  . Chest Pain     (Consider location/radiation/quality/duration/timing/severity/associated sxs/prior Treatment) HPI  Patient with history of asthma reports exacerbation for the past 3-4 weeks. States she's not been sick with the pollen has been making her breathing worse. Reports chest tightness, shortness of breath. She has been using her albuterol inhaler and her nephew is albuterol nebulizer without improvement. States she has gone through several inhalers this month. Has been somewhat lightheaded and twisted her right knee while almost falling 2-3 weeks ago.  Denies fever, sore throat, nasal congestion.  Denies leg swelling, weakness or numbness in the legs.  Denies sick contacts.   Past Medical History  Diagnosis Date  . Asthma    Past Surgical History  Procedure Laterality Date  . Cesarean section     Family History  Problem Relation Age of Onset  . Asthma Mother   . Asthma Daughter    History  Substance Use Topics  . Smoking status: Current Every Day Smoker -- 0.50 packs/day    Types: Cigarettes  . Smokeless tobacco: Not on file  . Alcohol Use: No     Comment: occasionally    OB History   Grav Para Term Preterm Abortions TAB SAB Ect Mult Living                 Review of Systems  Constitutional: Negative for fever.  Respiratory: Positive for cough, chest tightness and shortness of breath.   Gastrointestinal: Negative for nausea, vomiting, abdominal pain and diarrhea.  Genitourinary: Negative for dysuria, urgency and frequency.  Musculoskeletal: Positive for arthralgias.  Neurological: Negative for weakness and numbness.  All other systems reviewed and are negative.      Allergies  Review of patient's allergies indicates no known allergies.  Home Medications   Current Outpatient Rx  Name  Route   Sig  Dispense  Refill  . albuterol (PROVENTIL HFA;VENTOLIN HFA) 108 (90 BASE) MCG/ACT inhaler   Inhalation   Inhale 2 puffs into the lungs every 4 (four) hours as needed for wheezing or shortness of breath.   1 Inhaler   0   . albuterol (PROVENTIL) (2.5 MG/3ML) 0.083% nebulizer solution   Nebulization   Take 2.5 mg by nebulization every 6 (six) hours as needed for wheezing or shortness of breath.         . Cetirizine HCl (ZYRTEC ALLERGY) 10 MG CAPS   Oral   Take 1 capsule (10 mg total) by mouth at bedtime and may repeat dose one time if needed.   30 capsule   2   . diphenhydramine-acetaminophen (TYLENOL PM) 25-500 MG TABS   Oral   Take 2 tablets by mouth at bedtime.         . mometasone-formoterol (DULERA) 100-5 MCG/ACT AERO   Inhalation   Inhale 2 puffs into the lungs 2 (two) times daily.   1 Inhaler   0    BP 141/76  Pulse 85  Resp 26  Ht 5\' 2"  (1.575 m)  Wt 320 lb 12.8 oz (145.514 kg)  BMI 58.66 kg/m2  SpO2 100%  LMP 01/06/2014 Physical Exam  Nursing note and vitals reviewed. Constitutional: She appears well-developed and well-nourished. No distress.  HENT:  Head: Normocephalic and atraumatic.  Neck: Neck supple.  Cardiovascular: Normal rate and regular rhythm.  Pulmonary/Chest: Effort normal. No respiratory distress. She has decreased breath sounds. She has no wheezes. She has no rhonchi. She has no rales.  Abdominal: Soft. She exhibits no distension. There is no tenderness. There is no rebound and no guarding.  obese  Neurological: She is alert.  Skin: She is not diaphoretic.    ED Course  Procedures (including critical care time) Labs Review Labs Reviewed  CBC - Abnormal; Notable for the following:    Hemoglobin 9.9 (*)    HCT 32.3 (*)    MCV 73.6 (*)    MCH 22.6 (*)    RDW 16.2 (*)    Platelets 561 (*)    All other components within normal limits  BASIC METABOLIC PANEL  I-STAT TROPOININ, ED   Imaging Review Dg Chest 2 View  01/08/2014    CLINICAL DATA:  Chest pain, wheezing, cough, dizziness.  EXAM: CHEST  2 VIEW  COMPARISON:  12/31/2012  FINDINGS: The cardiac silhouette is upper limits of normal in size, unchanged. The lungs remain slightly hypoinflated. No airspace consolidation, edema, pleural effusion, or pneumothorax is identified. No acute osseous abnormality is seen.  IMPRESSION: Unchanged appearance of the chest without evidence of acute airspace disease.   Electronically Signed   By: Logan Bores   On: 01/08/2014 13:28     EKG Interpretation None      3:16 PM Patient reports some improvement after first neb treatment.  She is moving more air now and is starting to wheeze.  Will do hour long neb treatment.   MDM   Final diagnoses:  Asthma exacerbation  Anemia    Pt with asthma exacerbation x weeks.  No fevers.  CXR negative.  Decadron, albuterol, atrovent, magnesium given.  Showing some improvement with nebs.  Pt on hour long neb (2nd neb treatment) at change of shift.  Discussed pt with Jeannett Senior, PA-C, who assumes care at change of shift.      [place], PA-C 01/08/14 1559

## 2014-01-08 NOTE — ED Provider Notes (Signed)
Hannah Baldwin is a 43 y.o. female who presents emergency department complaining of shortness of breath and chest pain. She was signed out to me at shift change. Patient has history of asthma, with prior admissions. She is a current smoker. Patient wasn't evaluated by PA [place], patient with decreased air movement of both lungs. Chest x-ray, labs including troponin are unremarkable. She is receiving nebs for her shortness of breath and wheezing. She received 1 treatment of Atrovent and albuterol. She's currently getting an hour long neb.  4:42 PM just checked on the patient, still is getting her hour long neb. She is doing well.  5:30 PM Pt continue to wheeze but feels better. Worried about going home and having to come back. Her VS are nromal, oxygen sat 100% on RA. I had a Tech ambulate pt int he hallway. Her oxygen sat remained above 98% at all times. She was able to walk without difficulty. Discussed with Dr. Aline Brochure who wanted to take a look at the pt.    6:48 PM Offered admission vs d/c depending on how pt felt. Pt chose to go home at this time. She will return if worsening. VS continue to be normal. She is in no distress.   Filed Vitals:   01/08/14 1630 01/08/14 1700 01/08/14 1724 01/08/14 1730  BP: 138/80 136/97  143/95  Pulse:  103 95   TempSrc:      Resp: 22 18 21 24   Height:      Weight:      SpO2:  100% 100%      Renold Genta, PA-C 01/08/14 1852

## 2014-01-08 NOTE — ED Notes (Signed)
While ambulating, pt's O2 sats stayed at 99%.

## 2014-01-08 NOTE — Discharge Instructions (Signed)
Continue either inhaler or treatment every 3-4 hrs. Take prednisone as prescribed until all gone with next dose tomorrow. Follow up with your doctor in 2 days for recheck. Return if symptoms are worsening.    Asthma, Adult Asthma is a recurring condition in which the airways tighten and narrow. Asthma can make it difficult to breathe. It can cause coughing, wheezing, and shortness of breath. Asthma episodes (also called asthma attacks) range from minor to life-threatening. Asthma cannot be cured, but medicines and lifestyle changes can help control it. CAUSES Asthma is believed to be caused by inherited (genetic) and environmental factors, but its exact cause is unknown. Asthma may be triggered by allergens, lung infections, or irritants in the air. Asthma triggers are different for each person. Common triggers include:   Animal dander.  Dust mites.  Cockroaches.  Pollen from trees or grass.  Mold.  Smoke.  Air pollutants such as dust, household cleaners, hair sprays, aerosol sprays, paint fumes, strong chemicals, or strong odors.  Cold air, weather changes, and winds (which increase molds and pollens in the air).  Strong emotional expressions such as crying or laughing hard.  Stress.  Certain medicines (such as aspirin) or types of drugs (such as beta-blockers).  Sulfites in foods and drinks. Foods and drinks that may contain sulfites include dried fruit, potato chips, and sparkling grape juice.  Infections or inflammatory conditions such as the flu, a cold, or an inflammation of the nasal membranes (rhinitis).  Gastroesophageal reflux disease (GERD).  Exercise or strenuous activity. SYMPTOMS Symptoms may occur immediately after asthma is triggered or many hours later. Symptoms include:  Wheezing.  Excessive nighttime or early morning coughing.  Frequent or severe coughing with a common cold.  Chest tightness.  Shortness of breath. DIAGNOSIS  The diagnosis of asthma  is made by a review of your medical history and a physical exam. Tests may also be performed. These may include:  Lung function studies. These tests show how much air you breath in and out.  Allergy tests.  Imaging tests such as X-rays. TREATMENT  Asthma cannot be cured, but it can usually be controlled. Treatment involves identifying and avoiding your asthma triggers. It also involves medicines. There are 2 classes of medicine used for asthma treatment:   Controller medicines. These prevent asthma symptoms from occurring. They are usually taken every day.  Reliever or rescue medicines. These quickly relieve asthma symptoms. They are used as needed and provide short-term relief. Your health care provider will help you create an asthma action plan. An asthma action plan is a written plan for managing and treating your asthma attacks. It includes a list of your asthma triggers and how they may be avoided. It also includes information on when medicines should be taken and when their dosage should be changed. An action plan may also involve the use of a device called a peak flow meter. A peak flow meter measures how well the lungs are working. It helps you monitor your condition. HOME CARE INSTRUCTIONS   Take medicine as directed by your health care provider. Speak with your health care provider if you have questions about how or when to take the medicines.  Use a peak flow meter as directed by your health care provider. Record and keep track of readings.  Understand and use the action plan to help minimize or stop an asthma attack without needing to seek medical care.  Control your home environment in the following ways to help prevent asthma attacks:  Do not smoke. Avoid being exposed to secondhand smoke.  Change your heating and air conditioning filter regularly.  Limit your use of fireplaces and wood stoves.  Get rid of pests (such as roaches and mice) and their droppings.  Throw away  plants if you see mold on them.  Clean your floors and dust regularly. Use unscented cleaning products.  Try to have someone else vacuum for you regularly. Stay out of rooms while they are being vacuumed and for a short while afterward. If you vacuum, use a dust mask from a hardware store, a double-layered or microfilter vacuum cleaner bag, or a vacuum cleaner with a HEPA filter.  Replace carpet with wood, tile, or vinyl flooring. Carpet can trap dander and dust.  Use allergy-proof pillows, mattress covers, and box spring covers.  Wash bed sheets and blankets every week in hot water and dry them in a dryer.  Use blankets that are made of polyester or cotton.  Clean bathrooms and kitchens with bleach. If possible, have someone repaint the walls in these rooms with mold-resistant paint. Keep out of the rooms that are being cleaned and painted.  Wash hands frequently. SEEK MEDICAL CARE IF:   You have wheezing, shortness of breath, or a cough even if taking medicine to prevent attacks.  The colored mucus you cough up (sputum) is thicker than usual.  Your sputum changes from clear or white to yellow, green, gray, or bloody.  You have any problems that may be related to the medicines you are taking (such as a rash, itching, swelling, or trouble breathing).  You are using a reliever medicine more than 2 3 times per week.  Your peak flow is still at 50 79% of you personal best after following your action plan for 1 hour. SEEK IMMEDIATE MEDICAL CARE IF:   You seem to be getting worse and are unresponsive to treatment during an asthma attack.  You are short of breath even at rest.  You get short of breath when doing very little physical activity.  You have difficulty eating, drinking, or talking due to asthma symptoms.  You develop chest pain.  You develop a fast heartbeat.  You have a bluish color to your lips or fingernails.  You are lightheaded, dizzy, or faint.  Your peak  flow is less than 50% of your personal best.  You have a fever or persistent symptoms for more than 2 3 days.  You have a fever and symptoms suddenly get worse. MAKE SURE YOU:   Understand these instructions.  Will watch your condition.  Will get help right away if you are not doing well or get worse. Document Released: 09/24/2005 Document Revised: 05/27/2013 Document Reviewed: 04/23/2013 [facility] Patient Information 2014 [place], [place].

## 2014-01-08 NOTE — ED Provider Notes (Signed)
Medical screening examination/treatment/procedure(s) were performed by non-physician practitioner and as supervising physician I was immediately available for consultation/collaboration.  Richarda Blade, MD 01/08/14 [phone]

## 2014-01-08 NOTE — ED Notes (Signed)
Patient transported to X-ray 

## 2014-01-08 NOTE — ED Notes (Addendum)
Pt in c/o chest tightness and fatigue since last week with cough and fever, states symptoms have continued without relief, also c/o right knee pain and swelling

## 2014-01-09 NOTE — ED Provider Notes (Signed)
Medical screening examination/treatment/procedure(s) were conducted as a shared visit with non-physician practitioner(s) and myself.  I personally evaluated the patient during the encounter.   EKG Interpretation None      I interviewed and examined the patient. Lungs w/ mild continued wheezing, no significant inc wob. Cardiac exam wnl. Abdomen soft.  Gave pt option of admission vs home. She would prefer to go home.   Blanchard Kelch, MD 01/09/14 [phone]

## 2014-01-19 ENCOUNTER — Ambulatory Visit: Payer: Self-pay

## 2014-05-25 ENCOUNTER — Encounter (HOSPITAL_COMMUNITY): Payer: Self-pay | Admitting: Emergency Medicine

## 2014-05-25 ENCOUNTER — Emergency Department (HOSPITAL_COMMUNITY)
Admission: EM | Admit: 2014-05-25 | Discharge: 2014-05-25 | Discharge status: Against Medical Advice | Payer: Self-pay | Attending: Emergency Medicine | Admitting: Emergency Medicine

## 2014-05-25 DIAGNOSIS — F17200 Nicotine dependence, unspecified, uncomplicated: Secondary | ICD-10-CM | POA: Insufficient documentation

## 2014-05-25 DIAGNOSIS — Z331 Pregnant state, incidental: Secondary | ICD-10-CM | POA: Insufficient documentation

## 2014-05-25 DIAGNOSIS — Z3490 Encounter for supervision of normal pregnancy, unspecified, unspecified trimester: Secondary | ICD-10-CM

## 2014-05-25 DIAGNOSIS — R05 Cough: Secondary | ICD-10-CM | POA: Insufficient documentation

## 2014-05-25 DIAGNOSIS — R1032 Left lower quadrant pain: Secondary | ICD-10-CM

## 2014-05-25 DIAGNOSIS — R110 Nausea: Secondary | ICD-10-CM | POA: Insufficient documentation

## 2014-05-25 DIAGNOSIS — R109 Unspecified abdominal pain: Secondary | ICD-10-CM | POA: Insufficient documentation

## 2014-05-25 DIAGNOSIS — IMO0002 Reserved for concepts with insufficient information to code with codable children: Secondary | ICD-10-CM | POA: Insufficient documentation

## 2014-05-25 DIAGNOSIS — R059 Cough, unspecified: Secondary | ICD-10-CM | POA: Insufficient documentation

## 2014-05-25 DIAGNOSIS — R0789 Other chest pain: Secondary | ICD-10-CM | POA: Insufficient documentation

## 2014-05-25 DIAGNOSIS — J45901 Unspecified asthma with (acute) exacerbation: Secondary | ICD-10-CM | POA: Insufficient documentation

## 2014-05-25 DIAGNOSIS — J4520 Mild intermittent asthma, uncomplicated: Secondary | ICD-10-CM

## 2014-05-25 DIAGNOSIS — Z79899 Other long term (current) drug therapy: Secondary | ICD-10-CM | POA: Insufficient documentation

## 2014-05-25 LAB — BASIC METABOLIC PANEL
Anion gap: 12 (ref 5–15)
BUN: 9 mg/dL (ref 6–23)
CALCIUM: 9.2 mg/dL (ref 8.4–10.5)
CO2: 25 mEq/L (ref 19–32)
Chloride: 103 mEq/L (ref 96–112)
Creatinine, Ser: 0.75 mg/dL (ref 0.50–1.10)
GFR calc Af Amer: >90 mL/min (ref >90)
GFR calc non Af Amer: >90 mL/min (ref >90)
Glucose, Bld: 147 mg/dL — ABNORMAL HIGH (ref 70–99)
Potassium: 3.2 mEq/L — ABNORMAL LOW (ref 3.7–5.3)
SODIUM: 140 meq/L (ref 137–147)

## 2014-05-25 LAB — URINALYSIS, ROUTINE W REFLEX MICROSCOPIC
Bilirubin Urine: NEGATIVE
Glucose, UA: NEGATIVE mg/dL
Hgb urine dipstick: NEGATIVE
KETONES UR: NEGATIVE mg/dL
Leukocytes, UA: NEGATIVE
Nitrite: NEGATIVE
PH: 7.5 (ref 5.0–8.0)
Protein, ur: NEGATIVE mg/dL
Specific Gravity, Urine: 1.008 (ref 1.005–1.030)
Urobilinogen, UA: 0.2 mg/dL (ref 0.0–1.0)

## 2014-05-25 LAB — POC URINE PREG, ED: PREG TEST UR: POSITIVE — AB

## 2014-05-25 LAB — CBC
HCT: 33.4 % — ABNORMAL LOW (ref 36.0–46.0)
HEMOGLOBIN: 9.8 g/dL — AB (ref 12.0–15.0)
MCH: 22.3 pg — AB (ref 26.0–34.0)
MCHC: 29.3 g/dL — ABNORMAL LOW (ref 30.0–36.0)
MCV: 75.9 fL — AB (ref 78.0–100.0)
Platelets: 473 10*3/uL — ABNORMAL HIGH (ref 150–400)
RBC: 4.4 MIL/uL (ref 3.87–5.11)
RDW: 17.6 % — ABNORMAL HIGH (ref 11.5–15.5)
WBC: 8.9 10*3/uL (ref 4.0–10.5)

## 2014-05-25 MED ORDER — ALBUTEROL SULFATE (2.5 MG/3ML) 0.083% IN NEBU
5.0000 mg | INHALATION_SOLUTION | Freq: Once | RESPIRATORY_TRACT | Status: AC
  Administered 2014-05-25: 5 mg via RESPIRATORY_TRACT
  Filled 2014-05-25: qty 6

## 2014-05-25 MED ORDER — PREDNISONE 20 MG PO TABS: 60.0000 mg | ORAL_TABLET | Freq: Every day | ORAL | Status: DC

## 2014-05-25 MED ORDER — ALBUTEROL SULFATE HFA 108 (90 BASE) MCG/ACT IN AERS: 1.0000 | INHALATION_SPRAY | Freq: Four times a day (QID) | RESPIRATORY_TRACT | Status: DC | PRN

## 2014-05-25 MED ORDER — ALBUTEROL SULFATE (2.5 MG/3ML) 0.083% IN NEBU: 5.0000 mg | INHALATION_SOLUTION | Freq: Four times a day (QID) | RESPIRATORY_TRACT | Status: DC | PRN

## 2014-05-25 MED ORDER — IPRATROPIUM BROMIDE 0.02 % IN SOLN
0.5000 mg | Freq: Once | RESPIRATORY_TRACT | Status: AC
  Administered 2014-05-25: 0.5 mg via RESPIRATORY_TRACT
  Filled 2014-05-25: qty 2.5

## 2014-05-25 NOTE — Discharge Instructions (Signed)
Abdominal Pain During Pregnancy Abdominal pain is common in pregnancy. Most of the time, it does not cause harm. There are many causes of abdominal pain. Some causes are more serious than others. Some of the causes of abdominal pain in pregnancy are easily diagnosed. Occasionally, the diagnosis takes time to understand. Other times, the cause is not determined. Abdominal pain can be a sign that something is very wrong with the pregnancy, or the pain may have nothing to do with the pregnancy at all. For this reason, always tell your health care provider if you have any abdominal discomfort. HOME CARE INSTRUCTIONS  Monitor your abdominal pain for any changes. The following actions may help to alleviate any discomfort you are experiencing:  Do not have sexual intercourse or put anything in your vagina until your symptoms go away completely.  Get plenty of rest until your pain improves.  Drink clear fluids if you feel nauseous. Avoid solid food as long as you are uncomfortable or nauseous.  Only take over-the-counter or prescription medicine as directed by your health care provider.  Keep all follow-up appointments with your health care provider. SEEK IMMEDIATE MEDICAL CARE IF:  You are bleeding, leaking fluid, or passing tissue from the vagina.  You have increasing pain or cramping.  You have persistent vomiting.  You have painful or bloody urination.  You have a fever.  You notice a decrease in your baby's movements.  You have extreme weakness or feel faint.  You have shortness of breath, with or without abdominal pain.  You develop a severe headache with abdominal pain.  You have abnormal vaginal discharge with abdominal pain.  You have persistent diarrhea.  You have abdominal pain that continues even after rest, or gets worse. MAKE SURE YOU:   Understand these instructions.  Will watch your condition.  Will get help right away if you are not doing well or get  worse. Document Released: 09/24/2005 Document Revised: 07/15/2013 Document Reviewed: 04/23/2013 [facility] Patient Information 2015 [place], [place]. This information is not intended to replace advice given to you by your health care provider. Make sure you discuss any questions you have with your health care provider. Ectopic Pregnancy An ectopic pregnancy is when the fertilized egg attaches (implants) outside the uterus. Most ectopic pregnancies occur in the fallopian tube. Rarely do ectopic pregnancies occur on the ovary, intestine, pelvis, or cervix. In an ectopic pregnancy, the fertilized egg does not have the ability to develop into a normal, healthy baby.  A ruptured ectopic pregnancy is one in which the fallopian tube gets torn or bursts and results in internal bleeding. Often there is intense abdominal pain, and sometimes, vaginal bleeding. Having an ectopic pregnancy can be life threatening. If left untreated, this dangerous condition can lead to a blood transfusion, abdominal surgery, or even death. CAUSES  Damage to the fallopian tubes is the suspected cause in most ectopic pregnancies.  RISK FACTORS Depending on your circumstances, the risk of having an ectopic pregnancy will vary. The level of risk can be divided into three categories. High Risk You have gone through infertility treatment. You have had a previous ectopic pregnancy. You have had previous tubal surgery. You have had previous surgery to have the fallopian tubes tied (tubal ligation). You have tubal problems or diseases. You have been exposed to DES. DES is a medicine that was used until 1971 and had effects on babies whose mothers took the medicine. You become pregnant while using an intrauterine device (IUD) for birth control.  Moderate Risk You have a history of infertility. You have a history of a sexually transmitted infection (STI). You have a history of pelvic inflammatory disease (PID). You have scarring from  endometriosis. You have multiple sexual partners. You smoke. Low Risk You have had previous pelvic surgery. You use vaginal douching. You became sexually active before 43 years of age. SIGNS AND SYMPTOMS  An ectopic pregnancy should be suspected in anyone who has missed a period and has abdominal pain or bleeding. You may experience normal pregnancy symptoms, such as: Nausea. Tiredness. Breast tenderness. Other symptoms may include: Pain with intercourse. Irregular vaginal bleeding or spotting. Cramping or pain on one side or in the lower abdomen. Fast heartbeat. Passing out while having a bowel movement. Symptoms of a ruptured ectopic pregnancy and internal bleeding may include: Sudden, severe pain in the abdomen and pelvis. Dizziness or fainting. Pain in the shoulder area. DIAGNOSIS  Tests that may be performed include: A pregnancy test. An ultrasound test. Testing the specific level of pregnancy hormone in the bloodstream. Taking a sample of uterus tissue (dilation and curettage, D&C). Surgery to perform a visual exam of the inside of the abdomen using a thin, lighted tube with a tiny camera on the end (laparoscope). TREATMENT  An injection of a medicine called methotrexate may be given. This medicine causes the pregnancy tissue to be absorbed. It is given if: The diagnosis is made early. The fallopian tube has not ruptured. You are considered to be a good candidate for the medicine. Usually, pregnancy hormone blood levels are checked after methotrexate treatment. This is to be sure the medicine is effective. It may take 4-6 weeks for the pregnancy to be absorbed (though most pregnancies will be absorbed by 3 weeks). Surgical treatment may be needed. A laparoscope may be used to remove the pregnancy tissue. If severe internal bleeding occurs, a cut (incision) may be made in the lower abdomen (laparotomy), and the ectopic pregnancy is removed. This stops the bleeding. Part of  the fallopian tube, or the whole tube, may be removed as well (salpingectomy). After surgery, pregnancy hormone tests may be done to be sure there is no pregnancy tissue left. You may receive a Rho (D) immune globulin shot if you are Rh negative and the father is Rh positive, or if you do not know the Rh type of the father. This is to prevent problems with any future pregnancy. SEEK IMMEDIATE MEDICAL CARE IF:  You have any symptoms of an ectopic pregnancy. This is a medical emergency. MAKE SURE YOU: Understand these instructions. Will watch your condition. Will get help right away if you are not doing well or get worse. Document Released: 11/01/2004 Document Revised: 02/08/2014 Document Reviewed: 04/23/2013 [facility] Patient Information 2015 [place], [place]. This information is not intended to replace advice given to you by your health care provider. Make sure you discuss any questions you have with your health care provider. Asthma, Acute Bronchospasm Acute bronchospasm caused by asthma is also referred to as an asthma attack. Bronchospasm means your air passages become narrowed. The narrowing is caused by inflammation and tightening of the muscles in the air tubes (bronchi) in your lungs. This can make it hard to breathe or cause you to wheeze and cough. CAUSES Possible triggers are: Animal dander from the skin, hair, or feathers of animals. Dust mites contained in house dust. Cockroaches. Pollen from trees or grass. Mold. Cigarette or tobacco smoke. Air pollutants such as dust, household cleaners, hair sprays, aerosol sprays,  paint fumes, strong chemicals, or strong odors. Cold air or weather changes. Cold air may trigger inflammation. Winds increase molds and pollens in the air. Strong emotions such as crying or laughing hard. Stress. Certain medicines such as aspirin or beta-blockers. Sulfites in foods and drinks, such as dried fruits and wine. Infections or inflammatory conditions, such  as a flu, cold, or inflammation of the nasal membranes (rhinitis). Gastroesophageal reflux disease (GERD). GERD is a condition where stomach acid backs up into your esophagus. Exercise or strenuous activity. SIGNS AND SYMPTOMS  Wheezing. Excessive coughing, particularly at night. Chest tightness. Shortness of breath. DIAGNOSIS  Your health care provider will ask you about your medical history and perform a physical exam. A chest X-ray or blood testing may be performed to look for other causes of your symptoms or other conditions that may have triggered your asthma attack. TREATMENT  Treatment is aimed at reducing inflammation and opening up the airways in your lungs. Most asthma attacks are treated with inhaled medicines. These include quick relief or rescue medicines (such as bronchodilators) and controller medicines (such as inhaled corticosteroids). These medicines are sometimes given through an inhaler or a nebulizer. Systemic steroid medicine taken by mouth or given through an IV tube also can be used to reduce the inflammation when an attack is moderate or severe. Antibiotic medicines are only used if a bacterial infection is present.  HOME CARE INSTRUCTIONS  Rest. Drink plenty of liquids. This helps the mucus to remain thin and be easily coughed up. Only use caffeine in moderation and do not use alcohol until you have recovered from your illness. Do not smoke. Avoid being exposed to secondhand smoke. You play a critical role in keeping yourself in good health. Avoid exposure to things that cause you to wheeze or to have breathing problems. Keep your medicines up-to-date and available. Carefully follow your health care provider's treatment plan. Take your medicine exactly as prescribed. When pollen or pollution is bad, keep windows closed and use an air conditioner or go to places with air conditioning. Asthma requires careful medical care. See your health care provider for a follow-up as  advised. If you are more than [redacted] weeks pregnant and you were prescribed any new medicines, let your obstetrician know about the visit and how you are doing. Follow up with your health care provider as directed. After you have recovered from your asthma attack, make an appointment with your outpatient doctor to talk about ways to reduce the likelihood of future attacks. If you do not have a doctor who manages your asthma, make an appointment with a primary care doctor to discuss your asthma. SEEK IMMEDIATE MEDICAL CARE IF:  You are getting worse. You have trouble breathing. If severe, call your local emergency services (911 in the [place] You develop chest pain or discomfort. You are vomiting. You are not able to keep fluids down. You are coughing up yellow, green, brown, or bloody sputum. You have a fever and your symptoms suddenly get worse. You have trouble swallowing. MAKE SURE YOU:  Understand these instructions. Will watch your condition. Will get help right away if you are not doing well or get worse. Document Released: 01/09/2007 Document Revised: 09/29/2013 Document Reviewed: 04/01/2013 [facility] Patient Information 2015 [place], [place]. This information is not intended to replace advice given to you by your health care provider. Make sure you discuss any questions you have with your health care provider.

## 2014-05-25 NOTE — ED Notes (Signed)
Radiology informed this RN that pt refused chest XR

## 2014-05-25 NOTE — ED Notes (Signed)
Pt sts some wheezing and asthma with cough and N/V x 2 days

## 2014-05-25 NOTE — ED Notes (Signed)
This RN observed the pt walking back to the room in NAD.

## 2014-05-25 NOTE — ED Notes (Signed)
Pt refused to wear heart monitor.

## 2014-05-25 NOTE — ED Provider Notes (Signed)
CSN: [id number]     Arrival date & time 05/25/14  1726 History   First MD Initiated Contact with Patient 05/25/14 2001     Chief Complaint  Patient presents with  . Asthma  . Cough  . Abdominal Pain     (Consider location/radiation/quality/duration/timing/severity/associated sxs/prior Treatment) Patient is a 43 y.o. female presenting with asthma, cough, and abdominal pain. The history is provided by the patient. No language interpreter was used.  Asthma This is a new problem. The current episode started yesterday. Associated symptoms include abdominal pain, congestion, coughing and nausea. Pertinent negatives include no chest pain, chills, fever, sore throat or vomiting. Associated symptoms comments: Increased wheezing and dry cough for the past 2 days. She has a history of asthma, currently out of the medication for her nebulizer. No fever. .  Cough Associated symptoms: shortness of breath and wheezing   Associated symptoms: no chest pain, no chills, no fever and no sore throat   Abdominal Pain Associated symptoms: cough, nausea and shortness of breath   Associated symptoms: no chest pain, no chills, no dysuria, no fever, no sore throat, no vaginal discharge and no vomiting   Associated symptoms comment:  She complains of left sided abdominal discomfort for the past 1-2 days. Nausea without vomiting. No urinary symptoms, vaginal discharge. She reports a history of irregular menses and recent spotting but no vaginal bleeding currently.    Past Medical History  Diagnosis Date  . Asthma    Past Surgical History  Procedure Laterality Date  . Cesarean section     Family History  Problem Relation Age of Onset  . Asthma Mother   . Asthma Daughter    History  Substance Use Topics  . Smoking status: Current Every Day Smoker -- 0.50 packs/day    Types: Cigarettes  . Smokeless tobacco: Not on file  . Alcohol Use: No     Comment: occasionally    OB History   Grav Para Term Preterm  Abortions TAB SAB Ect Mult Living                 Review of Systems  Constitutional: Negative for fever and chills.  HENT: Positive for congestion. Negative for sore throat and trouble swallowing.   Respiratory: Positive for cough, chest tightness, shortness of breath and wheezing.   Cardiovascular: Negative.  Negative for chest pain.  Gastrointestinal: Positive for nausea and abdominal pain. Negative for vomiting.  Genitourinary: Negative for dysuria and vaginal discharge.       See HPI.  Musculoskeletal: Negative.   Skin: Negative.   Neurological: Negative.  Negative for light-headedness.      Allergies  Review of patient's allergies indicates no known allergies.  Home Medications   Prior to Admission medications   Medication Sig Start Date End Date Taking? Authorizing Provider  albuterol (PROVENTIL) (2.5 MG/3ML) 0.083% nebulizer solution Take 2.5 mg by nebulization every 6 (six) hours as needed for wheezing or shortness of breath.    Historical Provider, MD  albuterol (PROVENTIL) (2.5 MG/3ML) 0.083% nebulizer solution Take 6 mLs (5 mg total) by nebulization every 4 (four) hours as needed for wheezing or shortness of breath. 01/08/14   Tatyana A Kirichenko, PA-C  Cetirizine HCl (ZYRTEC ALLERGY) 10 MG CAPS Take 1 capsule (10 mg total) by mouth at bedtime and may repeat dose one time if needed. 01/08/13   Thurnell Lose, MD  diphenhydramine-acetaminophen (TYLENOL PM) 25-500 MG TABS Take 2 tablets by mouth at bedtime.    Historical  Provider, MD  mometasone-formoterol (DULERA) 100-5 MCG/ACT AERO Inhale 2 puffs into the lungs 2 (two) times daily. 01/03/13   Shanker Kristeen Mans, MD  predniSONE (DELTASONE) 10 MG tablet Take 5 tab day 1, take 4 tab day 2, take 3 tab day 3, take 2 tab day 4, and take 1 tab day 5 01/08/14   Tatyana A Kirichenko, PA-C   BP 155/67  Pulse 82  Temp(Src) 97.6 F (36.4 C) (Oral)  Resp 20  SpO2 100% Physical Exam  Constitutional: She is oriented to person, place,  and time. She appears well-developed and well-nourished.  HENT:  Head: Normocephalic.  Neck: Normal range of motion. Neck supple.  Cardiovascular: Normal rate and regular rhythm.   Pulmonary/Chest: Effort normal and breath sounds normal. She has no wheezes. She has no rales.  Examined after nebulizer treatment in ED with   Abdominal: Soft. Bowel sounds are normal. There is no tenderness. There is no rebound and no guarding.  Musculoskeletal: Normal range of motion.  Neurological: She is alert and oriented to person, place, and time.  Skin: Skin is warm and dry. No rash noted.  Psychiatric: She has a normal mood and affect.    ED Course  Procedures (including critical care time) Labs Review Labs Reviewed  CBC - Abnormal; Notable for the following:    Hemoglobin 9.8 (*)    HCT 33.4 (*)    MCV 75.9 (*)    MCH 22.3 (*)    MCHC 29.3 (*)    RDW 17.6 (*)    Platelets 473 (*)    All other components within normal limits  URINALYSIS, ROUTINE W REFLEX MICROSCOPIC - Abnormal; Notable for the following:    APPearance CLOUDY (*)    All other components within normal limits  POC URINE PREG, ED - Abnormal; Notable for the following:    Preg Test, Ur POSITIVE (*)    All other components within normal limits  BASIC METABOLIC PANEL   Results for orders placed during the hospital encounter of 05/25/14  CBC      Result Value Ref Range   WBC 8.9  4.0 - 10.5 K/uL   RBC 4.40  3.87 - 5.11 MIL/uL   Hemoglobin 9.8 (*) 12.0 - 15.0 g/dL   HCT 33.4 (*) 36.0 - 46.0 %   MCV 75.9 (*) 78.0 - 100.0 fL   MCH 22.3 (*) 26.0 - 34.0 pg   MCHC 29.3 (*) 30.0 - 36.0 g/dL   RDW 17.6 (*) 11.5 - 15.5 %   Platelets 473 (*) 150 - 400 K/uL  BASIC METABOLIC PANEL      Result Value Ref Range   Sodium 140  137 - 147 mEq/L   Potassium 3.2 (*) 3.7 - 5.3 mEq/L   Chloride 103  96 - 112 mEq/L   CO2 25  19 - 32 mEq/L   Glucose, Bld 147 (*) 70 - 99 mg/dL   BUN 9  6 - 23 mg/dL   Creatinine, Ser 0.75  0.50 - 1.10 mg/dL    Calcium 9.2  8.4 - 10.5 mg/dL   GFR calc non Af Amer >90  >90 mL/min   GFR calc Af Amer >90  >90 mL/min   Anion gap 12  5 - 15  URINALYSIS, ROUTINE W REFLEX MICROSCOPIC      Result Value Ref Range   Color, Urine YELLOW  YELLOW   APPearance CLOUDY (*) CLEAR   Specific Gravity, Urine 1.008  1.005 - 1.030   pH 7.5  5.0 - 8.0   Glucose, UA NEGATIVE  NEGATIVE mg/dL   Hgb urine dipstick NEGATIVE  NEGATIVE   Bilirubin Urine NEGATIVE  NEGATIVE   Ketones, ur NEGATIVE  NEGATIVE mg/dL   Protein, ur NEGATIVE  NEGATIVE mg/dL   Urobilinogen, UA 0.2  0.0 - 1.0 mg/dL   Nitrite NEGATIVE  NEGATIVE   Leukocytes, UA NEGATIVE  NEGATIVE  POC URINE PREG, ED      Result Value Ref Range   Preg Test, Ur POSITIVE (*) NEGATIVE    Imaging Review No results found.   EKG Interpretation None      MDM   Final diagnoses:  None    1. Abdominal pain 2. Pregnant.  Discussed the possibility of ectopic pregnancy as cause of LLQ abdominal pain and positive pregnancy test. The patient declines/refuses further evaluation. We discussed the risk involved in leaving the hospital without further diagnosis. She acknowledged understanding of risk and still prefers to leave against medical advice. "I just want to go home and deal with this later". Patient discharged AMA.    Dewaine Oats, PA-C 06/03/14 [phone]

## 2014-06-03 NOTE — ED Provider Notes (Signed)
Medical screening examination/treatment/procedure(s) were performed by non-physician practitioner and as supervising physician I was immediately available for consultation/collaboration.   EKG Interpretation None        Wandra Arthurs, MD 06/03/14 1540

## 2014-06-08 ENCOUNTER — Inpatient Hospital Stay (HOSPITAL_COMMUNITY)
Admission: EM | Admit: 2014-06-08 | Discharge: 2014-06-11 | DRG: 202 | Disposition: A | Discharge status: Home / Self Care | Payer: Self-pay | Attending: Internal Medicine | Admitting: Internal Medicine

## 2014-06-08 ENCOUNTER — Emergency Department (HOSPITAL_COMMUNITY): Payer: Self-pay

## 2014-06-08 ENCOUNTER — Encounter (HOSPITAL_COMMUNITY): Payer: Self-pay | Admitting: Emergency Medicine

## 2014-06-08 DIAGNOSIS — Z87891 Personal history of nicotine dependence: Secondary | ICD-10-CM | POA: Diagnosis present

## 2014-06-08 DIAGNOSIS — Z6841 Body Mass Index (BMI) 40.0 and over, adult: Secondary | ICD-10-CM

## 2014-06-08 DIAGNOSIS — IMO0002 Reserved for concepts with insufficient information to code with codable children: Secondary | ICD-10-CM

## 2014-06-08 DIAGNOSIS — J45901 Unspecified asthma with (acute) exacerbation: Principal | ICD-10-CM

## 2014-06-08 DIAGNOSIS — F17200 Nicotine dependence, unspecified, uncomplicated: Secondary | ICD-10-CM | POA: Diagnosis present

## 2014-06-08 DIAGNOSIS — D509 Iron deficiency anemia, unspecified: Secondary | ICD-10-CM

## 2014-06-08 DIAGNOSIS — I517 Cardiomegaly: Secondary | ICD-10-CM | POA: Diagnosis present

## 2014-06-08 LAB — CBC
HCT: 31.5 % — ABNORMAL LOW (ref 36.0–46.0)
HEMOGLOBIN: 9.3 g/dL — AB (ref 12.0–15.0)
MCH: 22.2 pg — ABNORMAL LOW (ref 26.0–34.0)
MCHC: 29.5 g/dL — ABNORMAL LOW (ref 30.0–36.0)
MCV: 75.2 fL — ABNORMAL LOW (ref 78.0–100.0)
PLATELETS: 460 10*3/uL — AB (ref 150–400)
RBC: 4.19 MIL/uL (ref 3.87–5.11)
RDW: 17.7 % — ABNORMAL HIGH (ref 11.5–15.5)
WBC: 8.8 10*3/uL (ref 4.0–10.5)

## 2014-06-08 LAB — I-STAT TROPONIN, ED
TROPONIN I, POC: 0 ng/mL (ref 0.00–0.08)
Troponin i, poc: 0 ng/mL (ref 0.00–0.08)

## 2014-06-08 LAB — BASIC METABOLIC PANEL
ANION GAP: 12 (ref 5–15)
BUN: 11 mg/dL (ref 6–23)
CHLORIDE: 104 meq/L (ref 96–112)
CO2: 25 mEq/L (ref 19–32)
Calcium: 9.3 mg/dL (ref 8.4–10.5)
Creatinine, Ser: 0.75 mg/dL (ref 0.50–1.10)
GFR calc non Af Amer: >90 mL/min (ref >90)
Glucose, Bld: 116 mg/dL — ABNORMAL HIGH (ref 70–99)
Potassium: 3.8 mEq/L (ref 3.7–5.3)
Sodium: 141 mEq/L (ref 137–147)

## 2014-06-08 LAB — HCG, QUANTITATIVE, PREGNANCY

## 2014-06-08 LAB — POC URINE PREG, ED: PREG TEST UR: NEGATIVE

## 2014-06-08 MED ORDER — ACETAMINOPHEN 325 MG PO TABS
650.0000 mg | ORAL_TABLET | Freq: Four times a day (QID) | ORAL | Status: DC | PRN
  Administered 2014-06-09 – 2014-06-10 (×2): 650 mg via ORAL
  Filled 2014-06-08 (×2): qty 2

## 2014-06-08 MED ORDER — METHYLPREDNISOLONE SODIUM SUCC 40 MG IJ SOLR
40.0000 mg | INTRAMUSCULAR | Status: DC
Start: 2014-06-09 — End: 2014-06-09
  Administered 2014-06-09: 40 mg via INTRAVENOUS
  Filled 2014-06-08 (×2): qty 1

## 2014-06-08 MED ORDER — SODIUM CHLORIDE 0.9 % IJ SOLN
3.0000 mL | Freq: Two times a day (BID) | INTRAMUSCULAR | Status: DC
  Administered 2014-06-09 – 2014-06-10 (×3): 3 mL via INTRAVENOUS

## 2014-06-08 MED ORDER — ZOLPIDEM TARTRATE 5 MG PO TABS
5.0000 mg | ORAL_TABLET | Freq: Every evening | ORAL | Status: DC | PRN
  Administered 2014-06-09 – 2014-06-10 (×3): 5 mg via ORAL
  Filled 2014-06-08 (×3): qty 1

## 2014-06-08 MED ORDER — ALBUTEROL SULFATE (2.5 MG/3ML) 0.083% IN NEBU: 2.5000 mg | INHALATION_SOLUTION | RESPIRATORY_TRACT | Status: DC | PRN

## 2014-06-08 MED ORDER — ONDANSETRON HCL 4 MG/2ML IJ SOLN: 4.0000 mg | Freq: Four times a day (QID) | INTRAMUSCULAR | Status: DC | PRN

## 2014-06-08 MED ORDER — IPRATROPIUM-ALBUTEROL 0.5-2.5 (3) MG/3ML IN SOLN
3.0000 mL | Freq: Once | RESPIRATORY_TRACT | Status: AC
  Administered 2014-06-08: 3 mL via RESPIRATORY_TRACT
  Filled 2014-06-08: qty 3

## 2014-06-08 MED ORDER — IBUPROFEN 400 MG PO TABS
600.0000 mg | ORAL_TABLET | Freq: Once | ORAL | Status: AC
  Administered 2014-06-08: 600 mg via ORAL
  Filled 2014-06-08 (×2): qty 1

## 2014-06-08 MED ORDER — ALBUTEROL (5 MG/ML) CONTINUOUS INHALATION SOLN
5.0000 mg/h | INHALATION_SOLUTION | Freq: Once | RESPIRATORY_TRACT | Status: AC
  Administered 2014-06-08: 5 mg/h via RESPIRATORY_TRACT
  Filled 2014-06-08: qty 20

## 2014-06-08 MED ORDER — ONDANSETRON HCL 4 MG PO TABS: 4.0000 mg | ORAL_TABLET | Freq: Four times a day (QID) | ORAL | Status: DC | PRN

## 2014-06-08 MED ORDER — PREDNISONE 20 MG PO TABS
60.0000 mg | ORAL_TABLET | Freq: Once | ORAL | Status: AC
  Administered 2014-06-08: 60 mg via ORAL
  Filled 2014-06-08: qty 3

## 2014-06-08 MED ORDER — ACETAMINOPHEN 650 MG RE SUPP: 650.0000 mg | Freq: Four times a day (QID) | RECTAL | Status: DC | PRN

## 2014-06-08 MED ORDER — NICOTINE 14 MG/24HR TD PT24
14.0000 mg | MEDICATED_PATCH | Freq: Every day | TRANSDERMAL | Status: DC
  Administered 2014-06-09 – 2014-06-11 (×4): 14 mg via TRANSDERMAL
  Filled 2014-06-08 (×4): qty 1

## 2014-06-08 MED ORDER — ENOXAPARIN SODIUM 80 MG/0.8ML ~~LOC~~ SOLN
70.0000 mg | SUBCUTANEOUS | Status: DC
  Administered 2014-06-09 – 2014-06-10 (×3): 70 mg via SUBCUTANEOUS
  Filled 2014-06-08 (×5): qty 0.8

## 2014-06-08 MED ORDER — KETOROLAC TROMETHAMINE 30 MG/ML IJ SOLN: 30.0000 mg | Freq: Once | INTRAMUSCULAR | Status: DC

## 2014-06-08 MED ORDER — AZITHROMYCIN 500 MG IV SOLR
500.0000 mg | INTRAVENOUS | Status: DC
  Administered 2014-06-09 – 2014-06-10 (×2): 500 mg via INTRAVENOUS
  Filled 2014-06-08 (×3): qty 500

## 2014-06-08 MED ORDER — ALBUTEROL SULFATE HFA 108 (90 BASE) MCG/ACT IN AERS
6.0000 | INHALATION_SPRAY | Freq: Once | RESPIRATORY_TRACT | Status: AC
  Administered 2014-06-08: 6 via RESPIRATORY_TRACT
  Filled 2014-06-08: qty 6.7

## 2014-06-08 MED ORDER — BUDESONIDE 0.25 MG/2ML IN SUSP
0.2500 mg | Freq: Two times a day (BID) | RESPIRATORY_TRACT | Status: DC
  Administered 2014-06-08 – 2014-06-11 (×6): 0.25 mg via RESPIRATORY_TRACT
  Filled 2014-06-08 (×8): qty 2

## 2014-06-08 MED ORDER — ALBUTEROL SULFATE (2.5 MG/3ML) 0.083% IN NEBU
2.5000 mg | INHALATION_SOLUTION | RESPIRATORY_TRACT | Status: DC
  Administered 2014-06-08 – 2014-06-11 (×15): 2.5 mg via RESPIRATORY_TRACT
  Filled 2014-06-08 (×15): qty 3

## 2014-06-08 NOTE — ED Notes (Signed)
Pt here for chest pain and sob, pt was seen and treated for asthma and sts she is using the neb and inhalers and no relief, just feels jittery. Pt was wheezing on intial assessment but after sitting wheezing subsided, pt sts she had to walk from [employer] parking to ed

## 2014-06-08 NOTE — ED Notes (Signed)
Admitting Md at bedside

## 2014-06-08 NOTE — ED Notes (Signed)
Patient refuses to keep her BP cuff on. Will only agree to spot checking her BP.

## 2014-06-08 NOTE — H&P (Signed)
[facility] History and Physical  [name] VPX:[id number] DOB: April 21, 1971 DOA: 06/08/2014  Referring physician: ER physician. PCP: No PCP Per Patient   Chief Complaint: Shortness of breath.  HPI: Hannah Baldwin is a 43 y.o. female with history of asthma presents to the ER with persistent shortness of breath over the last 2 weeks. Patient had come to be severed to the ER and was discharged home on nebulizer and steroid taper. Despite which patient states she has been persistently short of breath with wheezing and productive cough. Denies any fever chills or chest pain. In the ER chest x-ray shows marked cardiomegaly. Despite giving multiple doses of nebulizer patient has been still wheezing and has been admitted for asthma exacerbation. Denies any nausea vomiting abdominal pain or diarrhea. Patient states she quit smoking 2 weeks ago. Denies any recent travel or sick contacts.   Review of Systems: As presented in the history of presenting illness, rest negative.  Past Medical History  Diagnosis Date  . Asthma    Past Surgical History  Procedure Laterality Date  . Cesarean section     Social History:  reports that she has been smoking Cigarettes.  She has been smoking about 0.50 packs per day. She does not have any smokeless tobacco history on file. She reports that she does not drink alcohol or use illicit drugs. Where does patient live home. Can patient participate in ADLs? Yes.  No Known Allergies  Family History:  Family History  Problem Relation Age of Onset  . Asthma Mother   . Asthma Daughter       Prior to Admission medications   Medication Sig Start Date End Date Taking? Authorizing Provider  albuterol (PROVENTIL HFA;VENTOLIN HFA) 108 (90 BASE) MCG/ACT inhaler Inhale 2 puffs into the lungs every 6 (six) hours as needed for wheezing or shortness of breath.   Yes Historical Provider, MD  albuterol (PROVENTIL) (2.5 MG/3ML) 0.083% nebulizer solution Take 2.5 mg by  nebulization every 6 (six) hours as needed for wheezing or shortness of breath.   Yes Historical Provider, MD  albuterol (PROVENTIL) (2.5 MG/3ML) 0.083% nebulizer solution Take 2.5 mg by nebulization every 6 (six) hours as needed for wheezing or shortness of breath.   Yes Historical Provider, MD  mometasone-formoterol (DULERA) 100-5 MCG/ACT AERO Inhale 2 puffs into the lungs 2 (two) times daily.   Yes Historical Provider, MD  predniSONE (DELTASONE) 20 MG tablet Take 60 mg by mouth daily with breakfast.   Yes Historical Provider, MD    Physical Exam: Filed Vitals:   06/08/14 1910 06/08/14 1934 06/08/14 2015 06/08/14 2130  BP:   147/79 149/87  Pulse:   87 90  Temp:      TempSrc:      Resp:   23 26  Height:      Weight:      SpO2: 100% 99% 100% 100%     General:  Obese not in distress.  Eyes: Anicteric no pallor.  ENT: No discharge from the ears eyes nose mouth.  Neck: No mass felt. No JVD elevated.  Cardiovascular: S1-S2 heard.  Respiratory: Bilateral expiratory wheeze heard no crepitations.  Abdomen: Soft nontender bowel sounds present. No guarding or rigidity.  Skin: No rash.  Musculoskeletal: No edema.  Psychiatric: Appears normal.  Neurologic: Alert awake oriented to time place and person. Moves all extremities.  Labs on Admission:  Basic Metabolic Panel:  Recent Labs Lab 06/08/14 1503  NA 141  K 3.8  CL 104  CO2 25  GLUCOSE 116*  BUN 11  CREATININE 0.75  CALCIUM 9.3   Liver Function Tests: No results found for this basename: AST, ALT, ALKPHOS, BILITOT, PROT, ALBUMIN,  in the last 168 hours No results found for this basename: LIPASE, AMYLASE,  in the last 168 hours No results found for this basename: AMMONIA,  in the last 168 hours CBC:  Recent Labs Lab 06/08/14 1503  WBC 8.8  HGB 9.3*  HCT 31.5*  MCV 75.2*  PLT 460*   Cardiac Enzymes: No results found for this basename: CKTOTAL, CKMB, CKMBINDEX, TROPONINI,  in the last 168 hours  BNP (last  3 results) No results found for this basename: PROBNP,  in the last 8760 hours CBG: No results found for this basename: GLUCAP,  in the last 168 hours  Radiological Exams on Admission: Dg Chest 2 View (if Patient Has Fever And/or Copd)  06/08/2014   CLINICAL DATA:  Shortness of breath, chest pain.  EXAM: CHEST  2 VIEW  COMPARISON:  Chest radiograph January 08, 2014  FINDINGS: Heart size appears mildly enlarged, mediastinal silhouette is unremarkable. No pleural effusions or focal consolidations. No pneumothorax. Soft tissue planes and included osseous structures are nonsuspicious. Large body habitus.  IMPRESSION: Mild cardiomegaly, no acute pulmonary process.   Electronically Signed   By: Elon Alas   On: 06/08/2014 15:36    EKG: Independently reviewed. Normal sinus rhythm with nonspecific T-wave changes.  Assessment/Plan Principal Problem:   Asthma exacerbation Active Problems:   Hypochromic microcytic anemia   1. Asthma exacerbation - continue with nebulizer and steroids. Patient has been placed empirically on Zithromax. Check respiratory viral panel. Since chest x-ray shows mild cardiomegaly check 2-D echo. 2. Microcytic hypochromic anemia - check anemia panel follow CBC. 3. Tobacco abuse - patient states she quit 2 weeks ago. On nicotine patch.    Code Status: Full code.  Family Communication: None.  Disposition Plan: Admit to inpatient.    Ella Golomb N. [facility] Pager [phone].  If 7PM-7AM, please contact night-coverage [email or web address] Password [email or web address] 06/08/2014, 10:51 PM

## 2014-06-08 NOTE — ED Provider Notes (Signed)
CSN: [id number]     Arrival date & time 06/08/14  1445 History   First MD Initiated Contact with Patient 06/08/14 1732     Chief Complaint  Patient presents with  . Chest Pain  . Shortness of Breath    Patient is a 43 y.o. female presenting with shortness of breath. The history is provided by the patient.  Shortness of Breath Severity:  Moderate Onset quality:  Gradual Duration:  1 week Timing:  Constant Progression:  Unchanged Chronicity:  Recurrent Context: not URI   Ineffective treatments:  Inhaler Associated symptoms: chest pain (tightness not pain), cough and wheezing   Associated symptoms: no abdominal pain, no diaphoresis, no fever, no hemoptysis, no PND, no sore throat and no sputum production   Risk factors: no family hx of DVT, no hx of PE/DVT and no prolonged immobilization    Pt tested positive for pregnancy 2 weeks ago per chart review but denies this.  Notes left sided back pain. Had an asthma exacerbation last week and was given steroid, got better but relapsed.  Has been hospitalized for asthma exacerbations and says it is similar to prior.  Never been intubated or receive critical care.  No leg pain or edema. Taking inhalers at home w.o relief.   Past Medical History  Diagnosis Date  . Asthma    Past Surgical History  Procedure Laterality Date  . Cesarean section     Family History  Problem Relation Age of Onset  . Asthma Mother   . Asthma Daughter    History  Substance Use Topics  . Smoking status: Current Every Day Smoker -- 0.50 packs/day    Types: Cigarettes  . Smokeless tobacco: Not on file  . Alcohol Use: No     Comment: occasionally    OB History   Grav Para Term Preterm Abortions TAB SAB Ect Mult Living                 Review of Systems  Constitutional: Negative for fever and diaphoresis.  HENT: Negative for sore throat.   Respiratory: Positive for cough, shortness of breath and wheezing. Negative for hemoptysis and sputum production.    Cardiovascular: Positive for chest pain (tightness not pain). Negative for PND.  Gastrointestinal: Negative for abdominal pain.    Allergies  Review of patient's allergies indicates no known allergies.  Home Medications   Prior to Admission medications   Medication Sig Start Date End Date Taking? Authorizing Provider  albuterol (PROVENTIL HFA;VENTOLIN HFA) 108 (90 BASE) MCG/ACT inhaler Inhale 2 puffs into the lungs every 6 (six) hours as needed for wheezing or shortness of breath.    Historical Provider, MD  albuterol (PROVENTIL HFA;VENTOLIN HFA) 108 (90 BASE) MCG/ACT inhaler Inhale 1-2 puffs into the lungs every 6 (six) hours as needed for wheezing or shortness of breath. 05/25/14   Shari A Upstill, PA-C  albuterol (PROVENTIL) (2.5 MG/3ML) 0.083% nebulizer solution Take 2.5 mg by nebulization every 6 (six) hours as needed for wheezing or shortness of breath.    Historical Provider, MD  albuterol (PROVENTIL) (2.5 MG/3ML) 0.083% nebulizer solution Take 6 mLs (5 mg total) by nebulization every 6 (six) hours as needed for wheezing or shortness of breath. 05/25/14   Shari A Upstill, PA-C  mometasone-formoterol (DULERA) 100-5 MCG/ACT AERO Inhale 2 puffs into the lungs 2 (two) times daily.    Historical Provider, MD  predniSONE (DELTASONE) 20 MG tablet Take 3 tablets (60 mg total) by mouth daily. 05/25/14   Nehemiah Settle  A Upstill, PA-C   BP 143/70  Pulse 85  Temp(Src) 98.5 F (36.9 C) (Oral)  Resp 18  Ht 5\' 3"  (1.6 m)  Wt 300 lb (136.079 kg)  BMI 53.16 kg/m2  SpO2 99% Physical Exam  Nursing note and vitals reviewed. Constitutional: She is oriented to person, place, and time. She appears well-developed and well-nourished. No distress.  HENT:  Head: Normocephalic and atraumatic.  Nose: Nose normal.  Mouth/Throat: No oropharyngeal exudate.  Eyes: Conjunctivae are normal.  Neck: Normal range of motion. Neck supple. No tracheal deviation present.  Cardiovascular: Normal rate, regular rhythm and  normal heart sounds.   No murmur heard. Pulmonary/Chest: Effort normal. No stridor. No respiratory distress. She has wheezes (end exp diffusely). She has no rales.  Tachypneic, audible wheezes, prolonged exp phase  Abdominal: Soft. Bowel sounds are normal. She exhibits no distension and no mass. There is no tenderness.  Musculoskeletal: Normal range of motion. She exhibits no edema.  No lower extremity edema, erythema, TTP or palpable cords  Neurological: She is alert and oriented to person, place, and time.  Skin: Skin is warm and dry. No rash noted.  Psychiatric: She has a normal mood and affect.    ED Course  Procedures (including critical care time) Labs Review Labs Reviewed  BASIC METABOLIC PANEL - Abnormal; Notable for the following:    Glucose, Bld 116 (*)    All other components within normal limits  CBC - Abnormal; Notable for the following:    Hemoglobin 9.3 (*)    HCT 31.5 (*)    MCV 75.2 (*)    MCH 22.2 (*)    MCHC 29.5 (*)    RDW 17.7 (*)    Platelets 460 (*)    All other components within normal limits  I-STAT TROPOININ, ED  POC URINE PREG, ED    Imaging Review Dg Chest 2 View (if Patient Has Fever And/or Copd)  06/08/2014   CLINICAL DATA:  Shortness of breath, chest pain.  EXAM: CHEST  2 VIEW  COMPARISON:  Chest radiograph January 08, 2014  FINDINGS: Heart size appears mildly enlarged, mediastinal silhouette is unremarkable. No pleural effusions or focal consolidations. No pneumothorax. Soft tissue planes and included osseous structures are nonsuspicious. Large body habitus.  IMPRESSION: Mild cardiomegaly, no acute pulmonary process.   Electronically Signed   By: Elon Alas   On: 06/08/2014 15:36     Date: 06/08/2014  Rate: 94  Rhythm: normal sinus rhythm  QRS Axis: normal  Intervals: normal  ST/T Wave abnormalities: nonspecific T wave changes, TWI in III  Conduction Disutrbances:none  Narrative Interpretation: NSR, nonspecific EKG changes compared to  prior  Old EKG Reviewed: changes noted , S wave new I, TWI in III new    MDM   Final diagnoses:  Asthma exacerbation   Pt with PMHS for recurrent asthma exacerbations who presents with similar sx as prior exacerbations.  Prolonged exp phase with end exp wheezes c.w. RAD. Not in overt resp distress.  Afebrile, chronic cough, CXR without PNA.  Tachypneic, but does not appear overtly in resp distress. Chest tightness. EKG with new change but trop neg.  Gave steroids.   PERC negative, but if pregnant, hypercoagulable.  Pregnancy test negative.  Doubt PE.   7:25 PM Tachypnea, 32-36/ min. Prolonged exp phase and wheezes scattered.  Still with dyspnea. After 6 puffs, start continuous.    Tachypnea and sx persisted after continuous.  No hypoxia.  Will admit to [facility].  Delta  trop negative. Pt agreeable with dispo plan.     Tammy Sours, MD 06/09/14 [phone]

## 2014-06-09 DIAGNOSIS — I517 Cardiomegaly: Secondary | ICD-10-CM

## 2014-06-09 DIAGNOSIS — F17200 Nicotine dependence, unspecified, uncomplicated: Secondary | ICD-10-CM

## 2014-06-09 LAB — TROPONIN I: Troponin I: <0.3 ng/mL (ref <0.30)

## 2014-06-09 LAB — RAPID URINE DRUG SCREEN, HOSP PERFORMED
AMPHETAMINES: NOT DETECTED
BENZODIAZEPINES: NOT DETECTED
Barbiturates: NOT DETECTED
COCAINE: NOT DETECTED
OPIATES: NOT DETECTED
Tetrahydrocannabinol: NOT DETECTED

## 2014-06-09 LAB — CBC
HEMATOCRIT: 31 % — AB (ref 36.0–46.0)
HEMOGLOBIN: 9.2 g/dL — AB (ref 12.0–15.0)
MCH: 21.7 pg — ABNORMAL LOW (ref 26.0–34.0)
MCHC: 29.7 g/dL — AB (ref 30.0–36.0)
MCV: 73.1 fL — AB (ref 78.0–100.0)
Platelets: 545 10*3/uL — ABNORMAL HIGH (ref 150–400)
RBC: 4.24 MIL/uL (ref 3.87–5.11)
RDW: 17.6 % — AB (ref 11.5–15.5)
WBC: 9.9 10*3/uL (ref 4.0–10.5)

## 2014-06-09 LAB — BASIC METABOLIC PANEL
Anion gap: 11 (ref 5–15)
BUN: 10 mg/dL (ref 6–23)
CO2: 24 mEq/L (ref 19–32)
Calcium: 9.4 mg/dL (ref 8.4–10.5)
Chloride: 106 mEq/L (ref 96–112)
Creatinine, Ser: 0.65 mg/dL (ref 0.50–1.10)
GFR calc Af Amer: >90 mL/min (ref >90)
GLUCOSE: 101 mg/dL — AB (ref 70–99)
POTASSIUM: 4.2 meq/L (ref 3.7–5.3)
Sodium: 141 mEq/L (ref 137–147)

## 2014-06-09 LAB — D-DIMER, QUANTITATIVE (NOT AT ARMC)

## 2014-06-09 LAB — TSH: TSH: 0.651 u[IU]/mL (ref 0.350–4.500)

## 2014-06-09 LAB — PRO B NATRIURETIC PEPTIDE: Pro B Natriuretic peptide (BNP): 40.6 pg/mL (ref 0–125)

## 2014-06-09 MED ORDER — METHYLPREDNISOLONE SODIUM SUCC 40 MG IJ SOLR
40.0000 mg | Freq: Three times a day (TID) | INTRAMUSCULAR | Status: DC
Start: 2014-06-09 — End: 2014-06-10
  Administered 2014-06-09 – 2014-06-10 (×3): 40 mg via INTRAVENOUS
  Filled 2014-06-09 (×6): qty 1

## 2014-06-09 NOTE — Progress Notes (Signed)
Pulse order while ambulating: started at 97%  On RA, HR 95%& ended at 95% RA & HR 122. while ambulating down hall way.  Will continue to monitor.  Karie Kirks, Therapist, sports.

## 2014-06-09 NOTE — Progress Notes (Signed)
Pt would like to take a shower.  Notified Dr. Clovis Fredrickson if pt can take tele off awaiting return call.  Karie Kirks, Therapist, sports.

## 2014-06-09 NOTE — Progress Notes (Signed)
Echocardiogram 2D Echocardiogram has been performed.  Hannah Baldwin 06/09/2014, 2:08 PM

## 2014-06-09 NOTE — ED Provider Notes (Signed)
This patient was seen in conjunction with the resident physician.  The documentation accurately reflects the patient's ED encounter.  On my exam, patient had increased work of breathing, though she was awake and alert, she was in respiratory distress.  Patient did not improve substantially her with multiple albuterol nebs, continuous treatment, steroids. Interestingly, the patient had pregnancy test was +2 weeks ago.  With consideration of possible pregnancy and chest pain/dyspnea, pedal is a consideration.  However, the patient's repeat pregnancy test was negative, more consistent with patient's denial of risk factors for pregnancy.   patient's EKG showed sinus rhythm, rate 94, nonspecific T wave changes, abnormal  Given concern for failure to improve (following >3 breathing treatments, including a continuous session, and steroids) patient required admission for further evaluation and management.     Carmin Muskrat, MD 06/09/14 [phone]

## 2014-06-09 NOTE — Progress Notes (Addendum)
PROGRESS NOTE  HAPPY BEGEMAN QBH:[id number] DOB: 03/31/1971 DOA: 06/08/2014 PCP: No PCP Per Patient  HPI/Recap of past 30 hours: 43 year old African American female with past medical history morbid obesity, tobacco use and asthma seen in the emergency room 2 weeks ago for shortness of breath and wheezing comes back to the emergency room with the same. Admitted to the hospitalist service last night for asthma exacerbation. Workup for CHF also in place as cardiomegaly noted on x-ray.  Patient feeling mildly better with nonproductive cough persisting today  Assessment/Plan: Principal Problem:   Asthma exacerbation: Continue IV steroids, breathing treatments. Check ambulatory pulse ox. Patient will need close outpatient followup, [facility] for [facility].  Awaiting respiratory virus panel Active Problems:   Tobacco use disorder: Patient states she quit 2 weeks ago, continue nicotine patch    Morbid obesity: Patient needs criteria BMI greater than 40    Hypochromic microcytic anemia: On iron    Cardiomegaly: Checking BNP and normal, given morbid obesity and chronic asthma, to rule out diastolic heart failure   Code Status: Full code  Family Communication: Daughter at the bedside  Disposition Plan: Hopefully home in the next one-2 days once breathing improves, CHF workup complete and outpatient setup done   Consultants:  None  Procedures:  2-D echo pending  Antibiotics:  Zithromax 9/1-present  HPI/Subjective: Patient feeling little bit better. Still some nonproductive cough. Less shortness of breath  Objective: BP 134/54  Pulse 77  Temp(Src) 97.8 F (36.6 C) (Oral)  Resp 20  Ht 5\' 3"  (1.6 m)  Wt 145.6 kg (320 lb 15.8 oz)  BMI 56.88 kg/m2  SpO2 100%  Intake/Output Summary (Last 24 hours) at 06/09/14 1028 Last data filed at 06/09/14 0904  Gross per 24 hour  Intake    850 ml  Output    300 ml  Net    550 ml   Filed Weights   06/08/14 2338 06/09/14 0409 06/09/14 0635  Weight: 146.2 kg (322 lb 5 oz) 145.9 kg (321 lb 10.4 oz) 145.6 kg (320 lb 15.8 oz)    Exam:   General:  Alert and oriented x3, no acute distress  Cardiovascular: Regular rate and rhythm, S1-S2  Respiratory: Decreased breath sounds throughout with bilateral end expiratory wheeze  Abdomen: Soft, obese, nontender, positive bowel sounds  Musculoskeletal: No clubbing or cyanosis, trace edema   Data Reviewed: Basic Metabolic Panel:  Recent Labs Lab 06/08/14 1503 06/09/14 0719  NA 141 141  K 3.8 4.2  CL 104 106  CO2 25 24  GLUCOSE 116* 101*  BUN 11 10  CREATININE 0.75 0.65  CALCIUM 9.3 9.4   Liver Function Tests: No results found for this basename: AST, ALT, ALKPHOS, BILITOT, PROT, ALBUMIN,  in the last 168 hours No results found for this basename: LIPASE, AMYLASE,  in the last 168 hours No results found for this basename: AMMONIA,  in the last 168 hours CBC:  Recent Labs Lab 06/08/14 1503 06/09/14 0719  WBC 8.8 9.9  HGB 9.3* 9.2*  HCT 31.5* 31.0*  MCV 75.2* 73.1*  PLT 460* 545*   Cardiac Enzymes:    Recent Labs Lab 06/08/14 2340  TROPONINI <0.30   BNP (last 3 results)  Recent Labs  06/09/14 0719  PROBNP 40.6     Studies: Dg Chest 2 View (if Patient Has Fever And/or Copd)  06/08/2014   IMPRESSION: Mild cardiomegaly, no acute pulmonary process.   Electronically Signed   By: Elon Alas  On: 06/08/2014 15:36    Scheduled Meds: . albuterol  2.5 mg Nebulization Q4H  . azithromycin  500 mg Intravenous Q24H  . budesonide (PULMICORT) nebulizer solution  0.25 mg Nebulization BID  . enoxaparin (LOVENOX) injection  70 mg Subcutaneous Q24H  . methylPREDNISolone (SOLU-MEDROL) injection  40 mg Intravenous Q8H  . nicotine  14 mg Transdermal Daily  . sodium chloride  3 mL Intravenous Q12H  . sodium chloride  3 mL Intravenous Q12H    Continuous Infusions:    Time spent: 25 minutes  [facility] Pager [phone]. If 7PM-7AM, please contact night-coverage at [email or web address], password [facility] 06/09/2014, 10:28 AM  LOS: 1 day

## 2014-06-09 NOTE — Progress Notes (Signed)
Nutrition Brief Note  Patient identified on the Malnutrition Screening Tool (MST) Report  Wt Readings from Last 15 Encounters:  06/09/14 320 lb 15.8 oz (145.6 kg)  01/08/14 320 lb 12.8 oz (145.514 kg)  12/31/12 316 lb 12.8 oz (143.7 kg)    Body mass index is 56.88 kg/(m^2). Patient meets criteria for Morbid Obesity based on current BMI.   Current diet order is Regular, patient is consuming approximately 100% of meals at this time. No evidence of recent weight loss. Labs and medications reviewed.   No nutrition interventions warranted at this time. If nutrition issues arise, please consult RD.   Pryor Ochoa RD, LDN Inpatient Clinical Dietitian Pager: [phone] After Hours Pager: [phone]

## 2014-06-10 MED ORDER — PANTOPRAZOLE SODIUM 40 MG PO TBEC
40.0000 mg | DELAYED_RELEASE_TABLET | Freq: Every day | ORAL | Status: DC
  Administered 2014-06-10 – 2014-06-11 (×2): 40 mg via ORAL
  Filled 2014-06-10 (×2): qty 1

## 2014-06-10 MED ORDER — METHYLPREDNISOLONE SODIUM SUCC 40 MG IJ SOLR
40.0000 mg | Freq: Two times a day (BID) | INTRAMUSCULAR | Status: DC
  Administered 2014-06-10 – 2014-06-11 (×2): 40 mg via INTRAVENOUS
  Filled 2014-06-10 (×4): qty 1

## 2014-06-10 MED ORDER — GUAIFENESIN-DM 100-10 MG/5ML PO SYRP
5.0000 mL | ORAL_SOLUTION | ORAL | Status: DC | PRN
  Administered 2014-06-11: 5 mL via ORAL
  Filled 2014-06-10: qty 5

## 2014-06-10 MED ORDER — AZITHROMYCIN 500 MG PO TABS
500.0000 mg | ORAL_TABLET | Freq: Every day | ORAL | Status: DC
  Administered 2014-06-10: 500 mg via ORAL
  Filled 2014-06-10 (×2): qty 1

## 2014-06-10 NOTE — Progress Notes (Signed)
PROGRESS NOTE  Hannah Baldwin MWU:[id number] DOB: [birth year] DOA: 06/08/2014 PCP: No PCP Per Patient  HPI/Recap of past 64 hours: 43 year old African American female with past medical history morbid obesity, tobacco use and asthma seen in the emergency room 2 weeks ago for shortness of breath and wheezing comes back to the emergency room with the same. Admitted to the hospitalist service last night for asthma exacerbation. Noted to have cardiomegaly on admission x-ray.  Echocardiogram and BNP normal. She is feeling better and breathing easier, but still some persistent coughing which at times can be quite distressing. And related and hallway with normal oxygenation, although heart rate went up into 120s.  Assessment/Plan: Principal Problem:   Asthma exacerbation: Taper down IV steroids, breathing treatments.  Patient will need close outpatient followup, request for [facility].  Awaiting respiratory virus panel. Add PPI plus Robitussin Active Problems:   Tobacco use disorder: Patient states she quit 2 weeks ago, continue nicotine patch    Morbid obesity: Patient needs criteria BMI greater than 40    Hypochromic microcytic anemia: On iron    Cardiomegaly: Heart failure ruled out   Code Status: Full code  Family Communication: Spoke with daughter at the bedside yesterday  Disposition Plan: Home, hopefully tomorrow, as wheezing and breathing improved   Consultants:  None  Procedures:  2-D echo done 9/2: Normal  Antibiotics:  Zithromax 9/1-present  HPI/Subjective: Persisting cough. Worse at night  Objective: BP 160/80  Pulse 81  Temp(Src) 98 F (36.7 C) (Oral)  Resp 20  Ht 5\' 3"  (1.6 m)  Wt 144.4 kg (318 lb 5.5 oz)  BMI 56.41 kg/m2  SpO2 98%  Intake/Output Summary (Last 24 hours) at 06/10/14 1449 Last data filed at 06/10/14 1343  Gross per 24 hour  Intake   1080 ml  Output   1801 ml  Net   -721 ml   Filed Weights   06/09/14 0409 06/09/14 0635 06/10/14 0600  Weight: 145.9 kg (321 lb 10.4 oz) 145.6 kg (320 lb 15.8 oz) 144.4 kg (318 lb 5.5 oz)    Exam:   General:  Alert and oriented x3, no acute distress  Cardiovascular: Regular rate and rhythm, S1-S2  Respiratory: Bilateral end expiratory wheeze, improved from previous day  Abdomen: Soft, obese, nontender, positive bowel sounds  Musculoskeletal: No clubbing or cyanosis, trace edema   Data Reviewed: Basic Metabolic Panel:  Recent Labs Lab 06/08/14 1503 06/09/14 0719  NA 141 141  K 3.8 4.2  CL 104 106  CO2 25 24  GLUCOSE 116* 101*  BUN 11 10  CREATININE 0.75 0.65  CALCIUM 9.3 9.4   Liver Function Tests: No results found for this basename: AST, ALT, ALKPHOS, BILITOT, PROT, ALBUMIN,  in the last 168 hours No results found for this basename: LIPASE, AMYLASE,  in the last 168 hours No results found for this basename: AMMONIA,  in the last 168 hours CBC:  Recent Labs Lab 06/08/14 1503 06/09/14 0719  WBC 8.8 9.9  HGB 9.3* 9.2*  HCT 31.5* 31.0*  MCV 75.2* 73.1*  PLT 460* 545*   Cardiac Enzymes:    Recent Labs Lab 06/08/14 2340  TROPONINI <0.30   BNP (last 3 results)  Recent Labs  06/09/14 0719  PROBNP 40.6     Studies: Dg Chest 2 View (if Patient Has Fever And/or Copd)  06/08/2014   IMPRESSION: Mild cardiomegaly, no acute pulmonary process.   Electronically Signed   By: Elon Alas  On: 06/08/2014 15:36    Scheduled Meds: . albuterol  2.5 mg Nebulization Q4H  . azithromycin  500 mg Oral QHS  . budesonide (PULMICORT) nebulizer solution  0.25 mg Nebulization BID  . enoxaparin (LOVENOX) injection  70 mg Subcutaneous Q24H  . methylPREDNISolone (SOLU-MEDROL) injection  40 mg Intravenous Q12H  . nicotine  14 mg Transdermal Daily  . pantoprazole  40 mg Oral Daily  . sodium chloride  3 mL Intravenous Q12H  . sodium chloride  3 mL Intravenous Q12H    Continuous Infusions:    Time spent: 15  minutes  [place] Pager [phone]. If 7PM-7AM, please contact night-coverage at [email or web address], password [facility] 06/10/2014, 2:49 PM  LOS: 2 days

## 2014-06-10 NOTE — Progress Notes (Signed)
Utilization review completed. Lorely Bubb, RN, BSN. 

## 2014-06-10 NOTE — Progress Notes (Signed)
Cardiac monitor discontinued per order. CCMD notified. Monitor cleaned.

## 2014-06-11 LAB — RESPIRATORY VIRUS PANEL
ADENOVIRUS: NOT DETECTED
INFLUENZA A: NOT DETECTED
Influenza A H1: NOT DETECTED
Influenza A H3: NOT DETECTED
Influenza B: NOT DETECTED
Metapneumovirus: NOT DETECTED
PARAINFLUENZA 1 A: NOT DETECTED
Parainfluenza 2: NOT DETECTED
Parainfluenza 3: NOT DETECTED
RESPIRATORY SYNCYTIAL VIRUS B: NOT DETECTED
Respiratory Syncytial Virus A: NOT DETECTED
Rhinovirus: DETECTED — AB

## 2014-06-11 MED ORDER — PREDNISONE 10 MG PO TABS: ORAL_TABLET | ORAL | Status: DC

## 2014-06-11 MED ORDER — GUAIFENESIN-DM 100-10 MG/5ML PO SYRP: 5.0000 mL | ORAL_SOLUTION | ORAL | Status: DC | PRN

## 2014-06-11 MED ORDER — NICOTINE 14 MG/24HR TD PT24: 14.0000 mg | MEDICATED_PATCH | Freq: Every day | TRANSDERMAL | Status: DC

## 2014-06-11 MED ORDER — ALBUTEROL SULFATE (2.5 MG/3ML) 0.083% IN NEBU
2.5000 mg | INHALATION_SOLUTION | Freq: Four times a day (QID) | RESPIRATORY_TRACT | Status: DC
  Filled 2014-06-11: qty 3

## 2014-06-11 MED ORDER — OMEPRAZOLE MAGNESIUM 20 MG PO TBEC: 20.0000 mg | DELAYED_RELEASE_TABLET | Freq: Every day | ORAL | Status: DC

## 2014-06-11 NOTE — Progress Notes (Signed)
Patient rested well throughout the night with no complaints or signs of distress. Will give report to the incoming nurse.

## 2014-06-11 NOTE — Discharge Summary (Signed)
Discharge Summary  Hannah Baldwin:[id number] DOB: 1971-09-11  PCP: No PCP Per Patient  Admit date: 06/08/2014 Discharge date: 06/11/2014  Time spent: 25 minutes  Recommendations for Outpatient Follow-up:  1. Patient is being set up with [facility] 2. New medication: Prednisone taper x4 days 3. New medication: Prilosec 20 mg over-the-counter daily  4. New medication: Nicotine patch daily  Discharge Diagnoses:  Active Hospital Problems   Diagnosis Date Noted  . Asthma exacerbation 12/31/2012  . Cardiomegaly 06/09/2014  . Hypochromic microcytic anemia 06/08/2014  . Morbid obesity 01/01/2013  . Tobacco use disorder 01/01/2013    Resolved Hospital Problems   Diagnosis Date Noted Date Resolved  No resolved problems to display.    Discharge Condition: Improved, being discharged home  Diet recommendation: Low fat  Filed Weights   06/09/14 0635 06/10/14 0600 06/11/14 0556  Weight: 145.6 kg (320 lb 15.8 oz) 144.4 kg (318 lb 5.5 oz) 144.788 kg (319 lb 3.2 oz)    History of present illness:  43 year old African American female with past medical history asthma, morbid obesity and tobacco use presented to the emergency room on 9/1-2 weeks of progressively worsening shortness of breath and wheezing. She was admitted to the hospitalist service. Also noted to have cardiomegaly on admission x-ray.  Hospital Course:  Principal Problem:   Asthma exacerbation: The patient was treated with Zithromax, scheduled and when necessary nebulizers, cough medicine. She reported having more coughing spells at night so we added PPI. She's also placed on IV steroids. Over the next few days, her breathing improved. She did not have too much hypoxia, but got very easily tachycardic when ambulating. On 9/4, her breathing was much improved and wheezing minimal. She will be discharged home advising to not smoke, complete prednisone taper and a course of PPI. Patient does not have  insurance and is being referred to the [facility]. Respiratory viral panel was done with results pending. Active Problems:   Tobacco use disorder: Patient was counseled to quit. She responded well nicotine patch and we are going to continue this as outpatient. I urged her that if she resumes smoking, to not smoke until everything is stabilized.    Morbid obesity: Patient met criteria with BMI greater than 40.    Hypochromic microcytic anemia: Likely iron deficiency anemia from heavy menses. Patient on iron.    Cardiomegaly: Patient underwent echocardiogram and BNP, both of which were normal.   Procedures:  2-D echocardiogram done 9/2: Normal   None  Discharge Exam: BP 124/79  Pulse 73  Temp(Src) 97.8 F (36.6 C) (Oral)  Resp 18  Ht '5\' 3"'  (1.6 m)  Wt 144.788 kg (319 lb 3.2 oz)  BMI 56.56 kg/m2  SpO2 99%  General: Alert and oriented x3, no acute distress Cardiovascular: Regular rate and rhythm, S1-S2 Respiratory: Mild end expiratory wheeze, however significant improvement with good airway exchange  Discharge Instructions You were cared for by a hospitalist during your hospital stay. If you have any questions about your discharge medications or the care you received while you were in the hospital after you are discharged, you can call the unit and asked to speak with the hospitalist on call if the hospitalist that took care of you is not available. Once you are discharged, your primary care physician will handle any further medical issues. Please note that NO REFILLS for any discharge medications will be authorized once you are discharged, as it is imperative that you return to  your primary care physician (or establish a relationship with a primary care physician if you do not have one) for your aftercare needs so that they can reassess your need for medications and monitor your lab values.  Discharge Instructions   Diet - low sodium heart healthy     Complete by:  As directed      Increase activity slowly    Complete by:  As directed             Medication List         albuterol 108 (90 BASE) MCG/ACT inhaler  Commonly known as:  PROVENTIL HFA;VENTOLIN HFA  Inhale 2 puffs into the lungs every 6 (six) hours as needed for wheezing or shortness of breath.     albuterol (2.5 MG/3ML) 0.083% nebulizer solution  Commonly known as:  PROVENTIL  Take 2.5 mg by nebulization every 6 (six) hours as needed for wheezing or shortness of breath.     albuterol (2.5 MG/3ML) 0.083% nebulizer solution  Commonly known as:  PROVENTIL  Take 2.5 mg by nebulization every 6 (six) hours as needed for wheezing or shortness of breath.     guaiFENesin-dextromethorphan 100-10 MG/5ML syrup  Commonly known as:  ROBITUSSIN DM  Take 5 mLs by mouth every 4 (four) hours as needed for cough.     mometasone-formoterol 100-5 MCG/ACT Aero  Commonly known as:  DULERA  Inhale 2 puffs into the lungs 2 (two) times daily.     nicotine 14 mg/24hr patch  Commonly known as:  NICODERM CQ - dosed in mg/24 hours  Place 1 patch (14 mg total) onto the skin daily.     omeprazole 20 MG tablet  Commonly known as:  PRILOSEC OTC  Take 1 tablet (20 mg total) by mouth daily.     predniSONE 10 MG tablet  Commonly known as:  DELTASONE  Take 40 mg (4 tabs) x 1 day, then 30 mg x 1 day, then 72m x 1 day, then 10 mg x 1 day0       No Known Allergies    The results of significant diagnostics from this hospitalization (including imaging, microbiology, ancillary and laboratory) are listed below for reference.    Significant Diagnostic Studies: Dg Chest 2 View (if Patient Has Fever And/or Copd)  06/08/2014   CLINICAL DATA:  Shortness of breath, chest pain.  EXAM: CHEST  2 VIEW  COMPARISON:  Chest radiograph January 08, 2014  FINDINGS: Heart size appears mildly enlarged, mediastinal silhouette is unremarkable. No pleural effusions or focal consolidations. No pneumothorax. Soft tissue  planes and included osseous structures are nonsuspicious. Large body habitus.  IMPRESSION: Mild cardiomegaly, no acute pulmonary process.   Electronically Signed   By: CElon Alas  On: 06/08/2014 15:36    Microbiology: No results found for this or any previous visit (from the past 240 hour(s)).   Labs: Basic Metabolic Panel:  Recent Labs Lab 06/08/14 1503 06/09/14 0719  NA 141 141  K 3.8 4.2  CL 104 106  CO2 25 24  GLUCOSE 116* 101*  BUN 11 10  CREATININE 0.75 0.65  CALCIUM 9.3 9.4   Liver Function Tests: No results found for this basename: AST, ALT, ALKPHOS, BILITOT, PROT, ALBUMIN,  in the last 168 hours No results found for this basename: LIPASE, AMYLASE,  in the last 168 hours No results found for this basename: AMMONIA,  in the last 168 hours CBC:  Recent Labs Lab 06/08/14 1503 06/09/14 0719  WBC 8.8  9.9  HGB 9.3* 9.2*  HCT 31.5* 31.0*  MCV 75.2* 73.1*  PLT 460* 545*   Cardiac Enzymes:  Recent Labs Lab 06/08/14 2340  TROPONINI <0.30   BNP: BNP (last 3 results)  Recent Labs  06/09/14 0719  PROBNP 40.6   CBG: No results found for this basename: GLUCAP,  in the last 168 hours     Signed:  [facility] 06/11/2014, 12:01 PM

## 2014-06-11 NOTE — Progress Notes (Signed)
Discharges instructions completed. Medications reviewed. Verbalizes understanding.

## 2014-06-11 NOTE — Discharge Instructions (Signed)

## 2014-06-11 NOTE — Care Management Note (Signed)
    Page 1 of 1   06/11/2014     3:09:53 PM CARE MANAGEMENT NOTE 06/11/2014  Patient:  Hannah Baldwin, Hannah Baldwin   Account Number:  [id number]  Date Initiated:  06/11/2014  Documentation initiated by:  [facility]  Subjective/Objective Assessment:   43 y.o. female with history of asthma presents to the ER with persistent shortness of breath over the last 2 weeks. //Home with spouse.     Action/Plan:   IV sol-u-medrol.//PCP issues.   Anticipated DC Date:  06/11/2014   Anticipated DC Plan:  [place]  CM consult  PCP issues      Choice offered to / List presented to:             Status of service:   Medicare Important Message given?   (If response is "NO", the following Medicare IM given date fields will be blank) Date Medicare IM given:   Medicare IM given by:   Date Additional Medicare IM given:   Additional Medicare IM given by:    Discharge Disposition:    Per UR Regulation:  Reviewed for med. necessity/level of care/duration of stay  If discussed at [place] of Stay Meetings, dates discussed:    Comments:  06/11/14 [place], RN, BSN, [place] [phone] PCP appt set up with Dr. Adrian Blackwater on 9/11 at [facility] [place] [facility].

## 2014-06-18 ENCOUNTER — Inpatient Hospital Stay: Payer: Self-pay | Admitting: Family Medicine

## 2015-01-03 ENCOUNTER — Inpatient Hospital Stay (HOSPITAL_COMMUNITY)
Admission: AD | Admit: 2015-01-03 | Discharge: 2015-01-03 | Disposition: A | Discharge status: Home / Self Care | Payer: Self-pay | Source: Ambulatory Visit | Attending: Obstetrics & Gynecology | Admitting: Obstetrics & Gynecology

## 2015-01-03 ENCOUNTER — Encounter (HOSPITAL_COMMUNITY): Payer: Self-pay | Admitting: *Deleted

## 2015-01-03 DIAGNOSIS — J45909 Unspecified asthma, uncomplicated: Secondary | ICD-10-CM | POA: Insufficient documentation

## 2015-01-03 DIAGNOSIS — Z825 Family history of asthma and other chronic lower respiratory diseases: Secondary | ICD-10-CM | POA: Insufficient documentation

## 2015-01-03 DIAGNOSIS — D62 Acute posthemorrhagic anemia: Secondary | ICD-10-CM

## 2015-01-03 DIAGNOSIS — J4540 Moderate persistent asthma, uncomplicated: Secondary | ICD-10-CM

## 2015-01-03 DIAGNOSIS — N939 Abnormal uterine and vaginal bleeding, unspecified: Secondary | ICD-10-CM | POA: Insufficient documentation

## 2015-01-03 DIAGNOSIS — Z79899 Other long term (current) drug therapy: Secondary | ICD-10-CM | POA: Insufficient documentation

## 2015-01-03 DIAGNOSIS — F17210 Nicotine dependence, cigarettes, uncomplicated: Secondary | ICD-10-CM | POA: Insufficient documentation

## 2015-01-03 DIAGNOSIS — N921 Excessive and frequent menstruation with irregular cycle: Secondary | ICD-10-CM

## 2015-01-03 LAB — CBC WITH DIFFERENTIAL/PLATELET
Basophils Absolute: 0 10*3/uL (ref 0.0–0.1)
Basophils Relative: 0 % (ref 0–1)
EOS ABS: 0.1 10*3/uL (ref 0.0–0.7)
Eosinophils Relative: 2 % (ref 0–5)
HCT: 26.8 % — ABNORMAL LOW (ref 36.0–46.0)
Hemoglobin: 7.7 g/dL — ABNORMAL LOW (ref 12.0–15.0)
LYMPHS ABS: 3 10*3/uL (ref 0.7–4.0)
LYMPHS PCT: 33 % (ref 12–46)
MCH: 20.6 pg — ABNORMAL LOW (ref 26.0–34.0)
MCHC: 28.7 g/dL — ABNORMAL LOW (ref 30.0–36.0)
MCV: 71.7 fL — ABNORMAL LOW (ref 78.0–100.0)
Monocytes Absolute: 0.4 10*3/uL (ref 0.1–1.0)
Monocytes Relative: 5 % (ref 3–12)
NEUTROS PCT: 60 % (ref 43–77)
Neutro Abs: 5.3 10*3/uL (ref 1.7–7.7)
PLATELETS: 576 10*3/uL — AB (ref 150–400)
RBC: 3.74 MIL/uL — AB (ref 3.87–5.11)
RDW: 17.5 % — AB (ref 11.5–15.5)
WBC: 8.9 10*3/uL (ref 4.0–10.5)

## 2015-01-03 LAB — WET PREP, GENITAL
CLUE CELLS WET PREP: NONE SEEN
TRICH WET PREP: NONE SEEN
WBC WET PREP: NONE SEEN
Yeast Wet Prep HPF POC: NONE SEEN

## 2015-01-03 LAB — URINALYSIS, ROUTINE W REFLEX MICROSCOPIC
BILIRUBIN URINE: NEGATIVE
Glucose, UA: NEGATIVE mg/dL
Ketones, ur: 15 mg/dL — AB
Leukocytes, UA: NEGATIVE
Nitrite: NEGATIVE
PH: 6 (ref 5.0–8.0)
Protein, ur: NEGATIVE mg/dL
UROBILINOGEN UA: 1 mg/dL (ref 0.0–1.0)

## 2015-01-03 LAB — POCT PREGNANCY, URINE: Preg Test, Ur: NEGATIVE

## 2015-01-03 LAB — URINE MICROSCOPIC-ADD ON

## 2015-01-03 MED ORDER — PREDNISONE 10 MG PO TABS: ORAL_TABLET | ORAL | Status: DC

## 2015-01-03 MED ORDER — IPRATROPIUM-ALBUTEROL 0.5-2.5 (3) MG/3ML IN SOLN
3.0000 mL | Freq: Once | RESPIRATORY_TRACT | Status: AC
  Administered 2015-01-03: 3 mL via RESPIRATORY_TRACT
  Filled 2015-01-03: qty 3

## 2015-01-03 MED ORDER — IPRATROPIUM BROMIDE 0.02 % IN SOLN: 0.5000 mg | Freq: Once | RESPIRATORY_TRACT | Status: DC

## 2015-01-03 MED ORDER — MEGESTROL ACETATE 40 MG PO TABS: ORAL_TABLET | ORAL | Status: DC

## 2015-01-03 MED ORDER — ALBUTEROL SULFATE (2.5 MG/3ML) 0.083% IN NEBU
2.5000 mg | INHALATION_SOLUTION | Freq: Once | RESPIRATORY_TRACT | Status: DC
Start: 2015-01-03 — End: 2015-01-03

## 2015-01-03 MED ORDER — FERROUS SULFATE 325 (65 FE) MG PO TABS: 325.0000 mg | ORAL_TABLET | Freq: Every day | ORAL | Status: DC

## 2015-01-03 NOTE — MAU Provider Note (Signed)
History     CSN: [id number]  Arrival date and time: 01/03/15 1539   First Provider Initiated Contact with Patient 01/03/15 1621      No chief complaint on file.  HPI  Hannah Baldwin is a 44 y.o.BF, G2P0002, with history of anemia and asthma presents with vaginal bleeding and dyspnea x 2 months. LMP began at the end of January and she has been bleeding large clots since then. Her periods have been irregular for many years but she has never bled this much for this long. She reports going through about 15 tampons every day. The bleeding has been constant and has not changed since it began. She c/o of fatigue, weakness, and dyspnea associated with the bleeding. Denies syncope. She has history of asthma exacerbations but reports this does not feel like one. Last asthma exacerbation was in 9/15 for which she was hospitalized for a few days. She reports wheezing and reports her asthma is not under control as she has been using her albuterol inhaler 5-6 times/day. She is sexually active but has not been since the bleeding began.   OB History    Gravida Para Term Preterm AB TAB SAB Ectopic Multiple Living   2 2 0 0 0 0 0 0 0 2       Past Medical History  Diagnosis Date  . Asthma     Past Surgical History  Procedure Laterality Date  . Cesarean section      Family History  Problem Relation Age of Onset  . Asthma Mother   . Asthma Daughter     History  Substance Use Topics  . Smoking status: Current Some Day Smoker -- 0.50 packs/day    Types: Cigarettes    Last Attempt to Quit: 05/08/2014  . Smokeless tobacco: Not on file  . Alcohol Use: No     Comment: occasionally     Allergies: No Known Allergies  Prescriptions prior to admission  Medication Sig Dispense Refill Last Dose  . albuterol (PROVENTIL HFA;VENTOLIN HFA) 108 (90 BASE) MCG/ACT inhaler Inhale 2 puffs into the lungs every 6 (six) hours as needed for wheezing or shortness of breath.   01/03/2015 at Unknown time  .  albuterol (PROVENTIL) (2.5 MG/3ML) 0.083% nebulizer solution Take 2.5 mg by nebulization every 6 (six) hours as needed for wheezing or shortness of breath.   unknown  . albuterol (PROVENTIL) (2.5 MG/3ML) 0.083% nebulizer solution Take 2.5 mg by nebulization every 6 (six) hours as needed for wheezing or shortness of breath.   Past Week at Unknown time  . guaiFENesin-dextromethorphan (ROBITUSSIN DM) 100-10 MG/5ML syrup Take 5 mLs by mouth every 4 (four) hours as needed for cough. 118 mL 0   . mometasone-formoterol (DULERA) 100-5 MCG/ACT AERO Inhale 2 puffs into the lungs 2 (two) times daily.   06/08/2014 at Unknown time  . nicotine (NICODERM CQ - DOSED IN MG/24 HOURS) 14 mg/24hr patch Place 1 patch (14 mg total) onto the skin daily. 28 patch 0   . omeprazole (PRILOSEC OTC) 20 MG tablet Take 1 tablet (20 mg total) by mouth daily. 30 tablet 1   . predniSONE (DELTASONE) 10 MG tablet Take 40 mg (4 tabs) x 1 day, then 30 mg x 1 day, then 20mg  x 1 day, then 10 mg x 1 day0 10 tablet 0     Review of Systems  Constitutional: Positive for malaise/fatigue. Negative for fever, chills and weight loss.  HENT: Negative.   Eyes: Negative.   Respiratory: Positive  for cough, shortness of breath and wheezing. Negative for hemoptysis and sputum production.   Cardiovascular: Positive for palpitations and orthopnea. Negative for chest pain.  Gastrointestinal: Positive for nausea. Negative for heartburn, vomiting, abdominal pain, diarrhea, constipation, blood in stool and melena.  Genitourinary: Negative for dysuria, urgency, frequency and hematuria.  Musculoskeletal: Negative.   Skin: Negative.   Neurological: Positive for weakness. Negative for dizziness, tingling, sensory change, focal weakness, seizures, loss of consciousness and headaches.       Light headed  Endo/Heme/Allergies: Negative.   Psychiatric/Behavioral: Negative.    Physical Exam   Blood pressure 117/59, pulse 98, temperature 98.3 F (36.8 C),  temperature source Oral, resp. rate 20, height 5' 3.5" (1.613 m), weight 147.419 kg (325 lb), last menstrual period 11/04/2014, SpO2 100 %.  Physical Exam  Nursing note and vitals reviewed. Constitutional: She is oriented to person, place, and time. She appears well-developed and well-nourished. No distress.  Morbidly obese  HENT:  Head: Normocephalic and atraumatic.  Eyes: Conjunctivae are normal. Pupils are equal, round, and reactive to light.  Neck: Normal range of motion. Neck supple.  Cardiovascular: Normal rate, regular rhythm, normal heart sounds and intact distal pulses.   Respiratory: No respiratory distress. She has wheezes. She exhibits no tenderness.  Labored breathing after walking Wheezes present in right upper lung.   GI: Soft. Bowel sounds are normal. She exhibits no distension and no mass. There is no tenderness. There is no rebound and no guarding.  Genitourinary: Uterus normal. There is no rash, tenderness, lesion or injury on the right labia. There is no rash, tenderness, lesion or injury on the left labia. Cervix exhibits no motion tenderness. Right adnexum displays no mass, no tenderness and no fullness. Left adnexum displays no mass, no tenderness and no fullness. There is bleeding in the vagina. No erythema or tenderness in the vagina. No foreign body around the vagina. No signs of injury around the vagina. No vaginal discharge found.  Moderate amount of blood in vaginal cana  Lymphadenopathy:    She has no cervical adenopathy.  Neurological: She is alert and oriented to person, place, and time. No cranial nerve deficit.  4/5 strength in all extremeties  Skin: Skin is warm and dry. She is not diaphoretic.  Psychiatric: She has a normal mood and affect. Her behavior is normal.    MAU Course  Procedures  MDM  CBC GC/C pending Wet prep   Assessment and Plan    [name] 01/03/2015, 4:53 PM

## 2015-01-03 NOTE — Discharge Instructions (Signed)
Abnormal Uterine Bleeding Abnormal uterine bleeding means bleeding from the vagina that is not your normal menstrual period. This can be:  Bleeding or spotting between periods.  Bleeding after sex (sexual intercourse).  Bleeding that is heavier or more than normal.  Periods that last longer than usual.  Bleeding after menopause. There are many problems that may cause this. Treatment will depend on the cause of the bleeding. Any kind of bleeding that is not normal should be reviewed by your doctor.  HOME CARE Watch your condition for any changes. These actions may lessen any discomfort you are having:  Do not use tampons or douches as told by your doctor.  Change your pads often. You should get regular pelvic exams and Pap tests. Keep all appointments for tests as told by your doctor. GET HELP IF:  You are bleeding for more than 1 week.  You feel dizzy at times. GET HELP RIGHT AWAY IF:   You pass out.  You have to change pads every 15 to 30 minutes.  You have belly pain.  You have a fever.  You become sweaty or weak.  You are passing large blood clots from the vagina.  You feel sick to your stomach (nauseous) and throw up (vomit). MAKE SURE YOU:  Understand these instructions.  Will watch your condition.  Will get help right away if you are not doing well or get worse. Document Released: 07/22/2009 Document Revised: 09/29/2013 Document Reviewed: 04/23/2013 [facility] Patient Information 2015 [place], [place]. This information is not intended to replace advice given to you by your health care provider. Make sure you discuss any questions you have with your health care provider.  Anemia, Nonspecific Anemia is a condition in which the concentration of red blood cells or hemoglobin in the blood is below normal. Hemoglobin is a substance in red blood cells that carries oxygen to the tissues of the body. Anemia results in not enough oxygen reaching these tissues.  CAUSES    Common causes of anemia include:   Excessive bleeding. Bleeding may be internal or external. This includes excessive bleeding from periods (in women) or from the intestine.   Poor nutrition.   Chronic kidney, thyroid, and liver disease.  Bone marrow disorders that decrease red blood cell production.  Cancer and treatments for cancer.  HIV, AIDS, and their treatments.  Spleen problems that increase red blood cell destruction.  Blood disorders.  Excess destruction of red blood cells due to infection, medicines, and autoimmune disorders. SIGNS AND SYMPTOMS   Minor weakness.   Dizziness.   Headache.  Palpitations.   Shortness of breath, especially with exercise.   Paleness.  Cold sensitivity.  Indigestion.  Nausea.  Difficulty sleeping.  Difficulty concentrating. Symptoms may occur suddenly or they may develop slowly.  DIAGNOSIS  Additional blood tests are often needed. These help your health care provider determine the best treatment. Your health care provider will check your stool for blood and look for other causes of blood loss.  TREATMENT  Treatment varies depending on the cause of the anemia. Treatment can include:   Supplements of iron, vitamin K44, or folic acid.   Hormone medicines.   A blood transfusion. This may be needed if blood loss is severe.   Hospitalization. This may be needed if there is significant continual blood loss.   Dietary changes.  Spleen removal. HOME CARE INSTRUCTIONS Keep all follow-up appointments. It often takes many weeks to correct anemia, and having your health care provider check on your  condition and your response to treatment is very important. SEEK IMMEDIATE MEDICAL CARE IF:   You develop extreme weakness, shortness of breath, or chest pain.   You become dizzy or have trouble concentrating.  You develop heavy vaginal bleeding.   You develop a rash.   You have bloody or black, tarry stools.    You faint.   You vomit up blood.   You vomit repeatedly.   You have abdominal pain.  You have a fever or persistent symptoms for more than 2-3 days.   You have a fever and your symptoms suddenly get worse.   You are dehydrated.  MAKE SURE YOU:  Understand these instructions.  Will watch your condition.  Will get help right away if you are not doing well or get worse. Document Released: 11/01/2004 Document Revised: 05/27/2013 Document Reviewed: 03/20/2013 [facility] Patient Information 2015 [place], [place]. This information is not intended to replace advice given to you by your health care provider. Make sure you discuss any questions you have with your health care provider. Asthma, Acute Bronchospasm Acute bronchospasm caused by asthma is also referred to as an asthma attack. Bronchospasm means your air passages become narrowed. The narrowing is caused by inflammation and tightening of the muscles in the air tubes (bronchi) in your lungs. This can make it hard to breathe or cause you to wheeze and cough. CAUSES Possible triggers are:  Animal dander from the skin, hair, or feathers of animals.  Dust mites contained in house dust.  Cockroaches.  Pollen from trees or grass.  Mold.  Cigarette or tobacco smoke.  Air pollutants such as dust, household cleaners, hair sprays, aerosol sprays, paint fumes, strong chemicals, or strong odors.  Cold air or weather changes. Cold air may trigger inflammation. Winds increase molds and pollens in the air.  Strong emotions such as crying or laughing hard.  Stress.  Certain medicines such as aspirin or beta-blockers.  Sulfites in foods and drinks, such as dried fruits and wine.  Infections or inflammatory conditions, such as a flu, cold, or inflammation of the nasal membranes (rhinitis).  Gastroesophageal reflux disease (GERD). GERD is a condition where stomach acid backs up into your esophagus.  Exercise or strenuous  activity. SIGNS AND SYMPTOMS   Wheezing.  Excessive coughing, particularly at night.  Chest tightness.  Shortness of breath. DIAGNOSIS  Your health care provider will ask you about your medical history and perform a physical exam. A chest X-ray or blood testing may be performed to look for other causes of your symptoms or other conditions that may have triggered your asthma attack. TREATMENT  Treatment is aimed at reducing inflammation and opening up the airways in your lungs. Most asthma attacks are treated with inhaled medicines. These include quick relief or rescue medicines (such as bronchodilators) and controller medicines (such as inhaled corticosteroids). These medicines are sometimes given through an inhaler or a nebulizer. Systemic steroid medicine taken by mouth or given through an IV tube also can be used to reduce the inflammation when an attack is moderate or severe. Antibiotic medicines are only used if a bacterial infection is present.  HOME CARE INSTRUCTIONS   Rest.  Drink plenty of liquids. This helps the mucus to remain thin and be easily coughed up. Only use caffeine in moderation and do not use alcohol until you have recovered from your illness.  Do not smoke. Avoid being exposed to secondhand smoke.  You play a critical role in keeping yourself in good health. Avoid  exposure to things that cause you to wheeze or to have breathing problems.  Keep your medicines up-to-date and available. Carefully follow your health care provider's treatment plan.  Take your medicine exactly as prescribed.  When pollen or pollution is bad, keep windows closed and use an air conditioner or go to places with air conditioning.  Asthma requires careful medical care. See your health care provider for a follow-up as advised. If you are more than [redacted] weeks pregnant and you were prescribed any new medicines, let your obstetrician know about the visit and how you are doing. Follow up with  your health care provider as directed.  After you have recovered from your asthma attack, make an appointment with your outpatient doctor to talk about ways to reduce the likelihood of future attacks. If you do not have a doctor who manages your asthma, make an appointment with a primary care doctor to discuss your asthma. SEEK IMMEDIATE MEDICAL CARE IF:   You are getting worse.  You have trouble breathing. If severe, call your local emergency services (911 in the U.S.).  You develop chest pain or discomfort.  You are vomiting.  You are not able to keep fluids down.  You are coughing up yellow, green, brown, or bloody sputum.  You have a fever and your symptoms suddenly get worse.  You have trouble swallowing. MAKE SURE YOU:   Understand these instructions.  Will watch your condition.  Will get help right away if you are not doing well or get worse. Document Released: 01/09/2007 Document Revised: 09/29/2013 Document Reviewed: 04/01/2013 [facility] Patient Information 2015 [place], [place]. This information is not intended to replace advice given to you by your health care provider. Make sure you discuss any questions you have with your health care provider.  [place] (Revised August 2014)   Chronic Pain Problems:   [place]:  [phone]           Patients need to be referred by their primary care doctor/specialist  Insufficient Money for Medicine:           United Way: call "211"    MAP Program at [place] or [place] [phone]            No Primary Care Doctor:  To locate a primary care doctor that accepts your insurance or provides certain services:           [place]: [phone]           Physician Referral Service: [phone] ask for My [place]  If no insurance, you need to see if you qualify for [facility] orange card, call to set      up appointment for  eligibility/enrollment at [phone] or [phone] or visit [place] ([address], [place] and [place]) to meet with a [facility] enrollment specialist.  Agencies that provide inexpensive (sliding fee scale) medical care:       [facility] - Family Medicine at [place] - [phone]     [facility] [place] [facility] [place] [facility] - [place] - [place] for Children - [place] [phone]  [facility] - Kathleen Argue  Child Health @ [place] [phone]  [facility] - [place] @ [place] - [phone]  [facility] Family [facility]: [phone]   Women's Clinic: [phone]   Planned Parenthood: [phone]   [facility] [place]    [place] Providers:           [facility] [phone] (No Family Planning accepted)          [address], [phone], Mon-Fri 9am-5pm          [place] [phone]  [place], [place], [place] 8am-5pm, Fri 8am-noon  [place]          [address], [address], Mon-Fri 7:30am-4:30pm          [place] - [phone]          [address], [facility] - [phone] [address]          Only accepts [employer] patients after they have their name applied to their card  Self Pay (no insurance) in [facility]:           Sickle Cell Patients:   [place], [phone] [facility] Internal Medicine:  [address] [place] [phone]       [facility]  [address], [place] [phone]  [facility] Family Practice:  [address] [phone]          [facility]gent Care           [place], [phone] [facility] for [place], [phone]           [facility] Urgent [place]           [place] [address], [place] [address]           [phone], Mon-Fri 9am-7pm, [place] 9am-1pm          [facility] - Family Medicine @ [facility]          [place], [place]          [facility] - [facility]           [address] [place] [facility] Adult and [place]  [place] [phone]          [place], [place]  [facility] - [place]   [place], [phone] [facility] - [facility]  [address], [phone]  Dr. [place] Lawman           [address], [place], [facility] [facility]           [address] [phone]          [facility]             [address] [phone]          [facility]           [place], [employer], 1st & 3rd Saturday every month, 10am-1pm  OTHERS:  [employer]  [employer] Only)  [phone] (Thursday only)  Strategies for finding a Primary Care Provider:  1) Find a Doctor and Pay Out of Pocket  Although you won't have to find out who is covered by your insurance plan, it is a good idea to ask around and get recommendations. You will then need to call the office and see if the doctor you have chosen will accept you as a new patient and what types of options they  offer for patients who are self-pay. Some doctors offer discounts or will set up payment plans for their patients who do not have insurance, but you will need to ask so you aren't surprised when you get to your appointment.  2) [place] - To see if you qualify for orange card access to healthcare safety net providers.  Call for appointment for eligibility/enrollment at [phone] or [phone]. (Uninsured, 0-200% FPL, qualifying info)  Applicants for [facility] are first required to see if they are eligible to enroll in the [facility] Marketplace before enrolling in [facility] (and get an exemption if they are not).  GCCN Criteria for acceptance is:  ? Proof of ACA Marketing exemption - form or documentation  ? Valid photo ID (driver's license, state identification card, passport, home country ID)  ? Proof of [facility] residency (e.g. drivers license, lease/landlord information, pay stubs with address, utility bill, bank statement, etc.)  ? Proof of income (1040, last year's tax return, W2, 4 current pay stubs, other income proof)  ? Proof of assets (current bank statement + 3 most recent, disability paperwork, life insurance info, tax value on autos, etc.)  3) [place] Department  Not all health departments have doctors that can see patients for sick visits, but many do, so it is worth a call to see if yours does. If you don't know where your local health department is, you can check in your phone book. The CDC also has a tool to help you locate your state's health department, and many state websites also have listings of all of their local health departments.  4) Find a [facility]  If your illness is not likely to be very severe or complicated, you may want to try a walk in clinic. These are popping up all over the country in pharmacies, drugstores, and shopping centers. They're usually staffed by nurse practitioners or physician assistants that have been  trained to treat common illnesses and complaints. They're usually fairly quick and inexpensive. However, if you have serious medical issues or chronic medical problems, these are probably not your best option   STD Testing:           [place], [facility]           [address] [place], phone [phone] or [phone]           Monday - Friday, call for an appointment          [place], [facility]  [address], [place], phone [phone] or [phone]           Monday - Friday, call for an appointment Abuse/Neglect:           [place]: [place]: [phone] (After Hours)  Emergency Shelter:  [facility] [phone]  [place]- [phone]  [place] - [phone]  [place] - [phone] (ages 68-17)  [place] @ [employer] - [phone]   Mammograms - Free at [facility]:           Room at the [place]: [phone]   (Homeless mother with children)          [place]: [phone] (Mothers only)  [facility]: [phone] (Pregnant 8-67 years old)  Adopt a Mom -[phone]  [facility]   [facility] and [place]  [address], [place] [phone]          [facility] [place]           [address], [place], [place]          [facility] Dept.           [place], [place]          [place] [facility]            [phone]          [facility] in [place]           [phone], [place]           [phone]           [phone] (After Hours)  [place] Abuse Resources:           Alcohol and Drug Services: [phone]           [facility]: [phone]          [facility]: [phone]   Narcotics Helpline - [phone]          [facility]: [phone]           Residential & Outpatient Substance Abuse Program - [facility] [place]: [phone]  [employer]  [place] and [place] - [phone] Psychological Services:          [place]: [phone]   Therapeutic Alternatives: [phone]          [place]           [address]  ACCESS LINE: [phone]     (24 Hour)  Mobile Crisis:   [place]:  Radio producer on [place] [phone] [facility] on [address] [phone]  Walk In [place]  ([place] - [phone] or [phone]  [place]. [place] [phone]  [place] [address], [place] [phone]   Dental Assistance:  If unable to pay or uninsured, contact: [facility]. to become qualified for the adult dental clinic. Patient must be enrolled in [facility] (uninsured, 0-200% FPL, qualifying info).  Enroll in [facility] first, then see Primary Care Physician assigned to you, the PCP makes a dental referral. [place] [facility] will receive referral and contacts patient for appointment.  Patients with Medicaid           [address], [place] (Children up to 42 +  Pregnant Women) - [phone]  [place] - Suite [phone]  If unable to pay, or uninsured: contact [place] [phone] in [place] - ([place] only + Pregnant Women), [phone] in [place] only) to become qualified for the adult dental clinic  Must see if eligible to enroll in [place] before enrolling into the [facility] (exemption required) [phone] for an appointment)  [email or web address];   [phone].  If not eligible for ACA, then go by Department of Health and Human Services to see if eligible for orange card.  [address] [place].  Once you get an orange card, you will have a Primary Care home who will then refer you to dental if needed.        Other Scientist, forensic:   [facility] [phone] (ext [phone])   [address]  Dr. Donn Pierini - [phone]   Roslyn Irwin   [address] [facility]           [place], [place], [place], [address]           [phone], Ext. [phone]           2nd and 4th Thursday of the month at 6:30am (Simple extractions only - no wisdom teeth or surgery) First come/First serve -First 10 clients served           [facility] [place], [place] and [place] residents only)          [address] Madelaine Bhat [employer], [place], [address]           [phone]                    [place] Department           [place] Department          [facility]          [phone]   Transportation Options:  Ambulance - 911 - [phone] per ride  Family Member to accompany patient (if stable) [employer] - [phone]  PART - [phone]  Taxi - [phone] - [place] [phone] (Application required)  [place] [phone]

## 2015-01-03 NOTE — MAU Note (Signed)
Patient states she started her period at the end of January, but has continued to bleed and has passed large blood clots.  Patient states that starting last week, she began feeling weak and SOB at times.  She states it feel like moving takes "all my energy."  Patient also has a hx of asthma and wonders if that is the problem with the SOB or if it is related to the bleeding.

## 2015-01-03 NOTE — MAU Provider Note (Signed)
History     CSN: [id number]  Arrival date and time: 01/03/15 1539   First Provider Initiated Contact with Patient 01/03/15 1621      No chief complaint on file.  HPI  [name] is a 44 y.o. G2P0002 who presents to MAU today with complaint of vaginal bleeding. She states a history of anemia and asthma. She states recent hospital admission for asthma exacerbation 06/2014. She has an albuterol inhaler that she has been using up to 5 times/day. She also claims to use her grandson's nebulizer when needed as she no longer has a PCP for management. She states bleeding started at the end of January and has continued bleeding daily since then. She states bleeding is heavy with clots. She states a history of irregular bleeding for the last few years, but denies any previous prolonged episodes like this. She also endorses weakness, fatigue and DOE.   OB History    Gravida Para Term Preterm AB TAB SAB Ectopic Multiple Living   2 2 0 0 0 0 0 0 0 2       Past Medical History  Diagnosis Date  . Asthma     Past Surgical History  Procedure Laterality Date  . Cesarean section      Family History  Problem Relation Age of Onset  . Asthma Mother   . Asthma Daughter     History  Substance Use Topics  . Smoking status: Current Some Day Smoker -- 0.50 packs/day    Types: Cigarettes    Last Attempt to Quit: 05/08/2014  . Smokeless tobacco: Not on file  . Alcohol Use: No     Comment: occasionally     Allergies: No Known Allergies  Prescriptions prior to admission  Medication Sig Dispense Refill Last Dose  . albuterol (PROVENTIL HFA;VENTOLIN HFA) 108 (90 BASE) MCG/ACT inhaler Inhale 2 puffs into the lungs every 6 (six) hours as needed for wheezing or shortness of breath.   01/03/2015 at Unknown time  . albuterol (PROVENTIL) (2.5 MG/3ML) 0.083% nebulizer solution Take 2.5 mg by nebulization every 6 (six) hours as needed for wheezing or shortness of breath.   rescue  .  guaiFENesin-dextromethorphan (ROBITUSSIN DM) 100-10 MG/5ML syrup Take 5 mLs by mouth every 4 (four) hours as needed for cough. (Patient not taking: Reported on 01/03/2015) 118 mL 0 more than one month  . nicotine (NICODERM CQ - DOSED IN MG/24 HOURS) 14 mg/24hr patch Place 1 patch (14 mg total) onto the skin daily. (Patient not taking: Reported on 01/03/2015) 28 patch 0   . omeprazole (PRILOSEC OTC) 20 MG tablet Take 1 tablet (20 mg total) by mouth daily. (Patient not taking: Reported on 01/03/2015) 30 tablet 1   . [DISCONTINUED] predniSONE (DELTASONE) 10 MG tablet Take 40 mg (4 tabs) x 1 day, then 30 mg x 1 day, then 20mg  x 1 day, then 10 mg x 1 day0 (Patient not taking: Reported on 01/03/2015) 10 tablet 0     Review of Systems  Constitutional: Positive for malaise/fatigue. Negative for fever.  Gastrointestinal: Negative for abdominal pain.  Genitourinary: Negative for dysuria, urgency and frequency.       + vaginal bleeding  Neurological: Positive for dizziness and weakness. Negative for loss of consciousness.   Physical Exam   Blood pressure 117/59, pulse 98, temperature 98.3 F (36.8 C), temperature source Oral, resp. rate 20, height 5' 3.5" (1.613 m), weight 325 lb (147.419 kg), last menstrual period 11/04/2014, SpO2 100 %.  Physical Exam  Constitutional: She is oriented to person, place, and time. She appears well-developed and well-nourished. No distress.  HENT:  Head: Normocephalic.  Cardiovascular: Normal rate, regular rhythm and normal heart sounds.   Respiratory: Effort normal. No respiratory distress. She has wheezes. She has no rales. She exhibits no tenderness.  GI: Soft. She exhibits no distension and no mass. There is no tenderness. There is no rebound and no guarding.  Genitourinary: Uterus is not enlarged (exam limited by patient body habitus) and not tender. Cervix exhibits no motion tenderness, no discharge and no friability. Right adnexum displays no mass and no tenderness.  Left adnexum displays no mass and no tenderness. There is bleeding (small amount of blood noted in the vaginal vault) in the vagina. No vaginal discharge found.  Neurological: She is alert and oriented to person, place, and time.  Skin: Skin is warm and dry. No erythema.  Psychiatric: She has a normal mood and affect.   Results for orders placed or performed during the hospital encounter of 01/03/15 (from the past 24 hour(s))  Urinalysis, Routine w reflex microscopic     Status: Abnormal   Collection Time: 01/03/15  4:19 PM  Result Value Ref Range   Color, Urine YELLOW YELLOW   APPearance CLEAR CLEAR   Specific Gravity, Urine >1.030 (H) 1.005 - 1.030   pH 6.0 5.0 - 8.0   Glucose, UA NEGATIVE NEGATIVE mg/dL   Hgb urine dipstick MODERATE (A) NEGATIVE   Bilirubin Urine NEGATIVE NEGATIVE   Ketones, ur 15 (A) NEGATIVE mg/dL   Protein, ur NEGATIVE NEGATIVE mg/dL   Urobilinogen, UA 1.0 0.0 - 1.0 mg/dL   Nitrite NEGATIVE NEGATIVE   Leukocytes, UA NEGATIVE NEGATIVE  Urine microscopic-add on     Status: Abnormal   Collection Time: 01/03/15  4:19 PM  Result Value Ref Range   Squamous Epithelial / LPF FEW (A) RARE   RBC / HPF 0-2 <3 RBC/hpf  Wet prep, genital     Status: None   Collection Time: 01/03/15  4:40 PM  Result Value Ref Range   Yeast Wet Prep HPF POC NONE SEEN NONE SEEN   Trich, Wet Prep NONE SEEN NONE SEEN   Clue Cells Wet Prep HPF POC NONE SEEN NONE SEEN   WBC, Wet Prep HPF POC NONE SEEN NONE SEEN  CBC with Differential/Platelet     Status: Abnormal   Collection Time: 01/03/15  4:55 PM  Result Value Ref Range   WBC 8.9 4.0 - 10.5 K/uL   RBC 3.74 (L) 3.87 - 5.11 MIL/uL   Hemoglobin 7.7 (L) 12.0 - 15.0 g/dL   HCT 26.8 (L) 36.0 - 46.0 %   MCV 71.7 (L) 78.0 - 100.0 fL   MCH 20.6 (L) 26.0 - 34.0 pg   MCHC 28.7 (L) 30.0 - 36.0 g/dL   RDW 17.5 (H) 11.5 - 15.5 %   Platelets 576 (H) 150 - 400 K/uL   Neutrophils Relative % 60 43 - 77 %   Neutro Abs 5.3 1.7 - 7.7 K/uL    Lymphocytes Relative 33 12 - 46 %   Lymphs Abs 3.0 0.7 - 4.0 K/uL   Monocytes Relative 5 3 - 12 %   Monocytes Absolute 0.4 0.1 - 1.0 K/uL   Eosinophils Relative 2 0 - 5 %   Eosinophils Absolute 0.1 0.0 - 0.7 K/uL   Basophils Relative 0 0 - 1 %   Basophils Absolute 0.0 0.0 - 0.1 K/uL  Pregnancy, urine POC     Status:  None   Collection Time: 01/03/15  4:59 PM  Result Value Ref Range   Preg Test, Ur NEGATIVE NEGATIVE    MAU Course  Procedures None  MDM UPT UA, wet prep, GC/Chlamydia, CBC, HIV and RPR today Albuterol and Atrovent nebulizer given in MAU - minimal improvement noted. Patient states steroids are often required for her to feel much improvement.  Patient is hemodynamically stable. Will prescribe Megace and Iron for AUB and anemia.  Discussed patient with Dr. Roselie Awkward. Agrees that Prednisone taper is appropriate for asthma, counseled patient on appropriate follow-up for asthma.  Assessment and Plan  A: Abnormal uterine bleeding Asthma  P: Discharge home Rx for Megace, Iron and Prednison given to patient Bleeding precautions discussed Patient referred to [facility] for further evaluation of AUB Outpatient pelvic and transvaginal US ordered  Resources for primary care provided to the patient. Advised establish care with PCP for asthma Patient may return to MAU as needed or if her condition were to change or worsen   Luvenia Redden, PA-C  01/03/2015, 6:02 PM

## 2015-01-04 LAB — RPR: RPR: NONREACTIVE

## 2015-01-04 LAB — HIV ANTIBODY (ROUTINE TESTING W REFLEX): HIV SCREEN 4TH GENERATION: NONREACTIVE

## 2015-01-04 LAB — GC/CHLAMYDIA PROBE AMP (~~LOC~~) NOT AT ARMC
Chlamydia: NEGATIVE
NEISSERIA GONORRHEA: NEGATIVE

## 2015-01-20 ENCOUNTER — Observation Stay (HOSPITAL_COMMUNITY)
Admission: EM | Admit: 2015-01-20 | Discharge: 2015-01-21 | Disposition: A | Discharge status: Home / Self Care | Payer: Self-pay | Attending: Family Medicine | Admitting: Family Medicine

## 2015-01-20 ENCOUNTER — Observation Stay (HOSPITAL_COMMUNITY): Payer: Self-pay

## 2015-01-20 ENCOUNTER — Encounter (HOSPITAL_COMMUNITY): Payer: Self-pay

## 2015-01-20 DIAGNOSIS — Z720 Tobacco use: Secondary | ICD-10-CM

## 2015-01-20 DIAGNOSIS — Z6841 Body Mass Index (BMI) 40.0 and over, adult: Secondary | ICD-10-CM | POA: Insufficient documentation

## 2015-01-20 DIAGNOSIS — J80 Acute respiratory distress syndrome: Secondary | ICD-10-CM

## 2015-01-20 DIAGNOSIS — R739 Hyperglycemia, unspecified: Secondary | ICD-10-CM | POA: Insufficient documentation

## 2015-01-20 DIAGNOSIS — Z8742 Personal history of other diseases of the female genital tract: Secondary | ICD-10-CM | POA: Insufficient documentation

## 2015-01-20 DIAGNOSIS — R0602 Shortness of breath: Secondary | ICD-10-CM

## 2015-01-20 DIAGNOSIS — J45909 Unspecified asthma, uncomplicated: Principal | ICD-10-CM | POA: Insufficient documentation

## 2015-01-20 DIAGNOSIS — D509 Iron deficiency anemia, unspecified: Secondary | ICD-10-CM | POA: Insufficient documentation

## 2015-01-20 DIAGNOSIS — E876 Hypokalemia: Secondary | ICD-10-CM | POA: Insufficient documentation

## 2015-01-20 DIAGNOSIS — F17210 Nicotine dependence, cigarettes, uncomplicated: Secondary | ICD-10-CM | POA: Insufficient documentation

## 2015-01-20 DIAGNOSIS — J449 Chronic obstructive pulmonary disease, unspecified: Secondary | ICD-10-CM | POA: Insufficient documentation

## 2015-01-20 DIAGNOSIS — J4540 Moderate persistent asthma, uncomplicated: Secondary | ICD-10-CM | POA: Diagnosis present

## 2015-01-20 DIAGNOSIS — N179 Acute kidney failure, unspecified: Secondary | ICD-10-CM | POA: Insufficient documentation

## 2015-01-20 DIAGNOSIS — N921 Excessive and frequent menstruation with irregular cycle: Secondary | ICD-10-CM

## 2015-01-20 DIAGNOSIS — J45901 Unspecified asthma with (acute) exacerbation: Secondary | ICD-10-CM

## 2015-01-20 HISTORY — DX: Legal blindness, as defined in USA: H54.8

## 2015-01-20 HISTORY — DX: Pneumonia, unspecified organism: J18.9

## 2015-01-20 HISTORY — DX: Anemia, unspecified: D64.9

## 2015-01-20 LAB — LIPID PANEL
CHOLESTEROL: 127 mg/dL (ref 0–200)
HDL: 37 mg/dL — ABNORMAL LOW (ref >39)
LDL CALC: 75 mg/dL (ref 0–99)
TRIGLYCERIDES: 75 mg/dL (ref <150)
Total CHOL/HDL Ratio: 3.4 RATIO
VLDL: 15 mg/dL (ref 0–40)

## 2015-01-20 LAB — BASIC METABOLIC PANEL
ANION GAP: 14 (ref 5–15)
BUN: 10 mg/dL (ref 6–23)
CHLORIDE: 105 mmol/L (ref 96–112)
CO2: 15 mmol/L — ABNORMAL LOW (ref 19–32)
Calcium: 8.5 mg/dL (ref 8.4–10.5)
Creatinine, Ser: 1.15 mg/dL — ABNORMAL HIGH (ref 0.50–1.10)
GFR calc non Af Amer: 57 mL/min — ABNORMAL LOW (ref >90)
GFR, EST AFRICAN AMERICAN: 67 mL/min — AB (ref >90)
Glucose, Bld: 381 mg/dL — ABNORMAL HIGH (ref 70–99)
POTASSIUM: 3.4 mmol/L — AB (ref 3.5–5.1)
Sodium: 134 mmol/L — ABNORMAL LOW (ref 135–145)

## 2015-01-20 LAB — CBC WITH DIFFERENTIAL/PLATELET
Basophils Absolute: 0 10*3/uL (ref 0.0–0.1)
Basophils Relative: 0 % (ref 0–1)
EOS PCT: 0 % (ref 0–5)
Eosinophils Absolute: 0 10*3/uL (ref 0.0–0.7)
HCT: 28.1 % — ABNORMAL LOW (ref 36.0–46.0)
Hemoglobin: 8.1 g/dL — ABNORMAL LOW (ref 12.0–15.0)
Lymphocytes Relative: 6 % — ABNORMAL LOW (ref 12–46)
Lymphs Abs: 0.6 10*3/uL — ABNORMAL LOW (ref 0.7–4.0)
MCH: 20 pg — ABNORMAL LOW (ref 26.0–34.0)
MCHC: 28.8 g/dL — ABNORMAL LOW (ref 30.0–36.0)
MCV: 69.2 fL — ABNORMAL LOW (ref 78.0–100.0)
MONOS PCT: 2 % — AB (ref 3–12)
Monocytes Absolute: 0.2 10*3/uL (ref 0.1–1.0)
NEUTROS PCT: 92 % — AB (ref 43–77)
Neutro Abs: 9.9 10*3/uL — ABNORMAL HIGH (ref 1.7–7.7)
PLATELETS: 806 10*3/uL — AB (ref 150–400)
RBC: 4.06 MIL/uL (ref 3.87–5.11)
RDW: 19.1 % — ABNORMAL HIGH (ref 11.5–15.5)
SMEAR REVIEW: INCREASED
WBC: 10.7 10*3/uL — ABNORMAL HIGH (ref 4.0–10.5)

## 2015-01-20 LAB — BRAIN NATRIURETIC PEPTIDE: B NATRIURETIC PEPTIDE 5: 41.7 pg/mL (ref 0.0–100.0)

## 2015-01-20 LAB — GLUCOSE, CAPILLARY: GLUCOSE-CAPILLARY: 366 mg/dL — AB (ref 70–99)

## 2015-01-20 MED ORDER — ALBUTEROL (5 MG/ML) CONTINUOUS INHALATION SOLN
10.0000 mg/h | INHALATION_SOLUTION | RESPIRATORY_TRACT | Status: DC
  Administered 2015-01-20: 10 mg/h via RESPIRATORY_TRACT
  Filled 2015-01-20: qty 20

## 2015-01-20 MED ORDER — INSULIN ASPART 100 UNIT/ML ~~LOC~~ SOLN
0.0000 [IU] | Freq: Three times a day (TID) | SUBCUTANEOUS | Status: DC
  Administered 2015-01-21: 9 [IU] via SUBCUTANEOUS
  Administered 2015-01-21: 3 [IU] via SUBCUTANEOUS

## 2015-01-20 MED ORDER — ACETAMINOPHEN 325 MG PO TABS: 650.0000 mg | ORAL_TABLET | Freq: Four times a day (QID) | ORAL | Status: DC | PRN

## 2015-01-20 MED ORDER — ALBUTEROL SULFATE (2.5 MG/3ML) 0.083% IN NEBU: 2.5000 mg | INHALATION_SOLUTION | RESPIRATORY_TRACT | Status: DC | PRN

## 2015-01-20 MED ORDER — ONDANSETRON HCL 4 MG/2ML IJ SOLN: 4.0000 mg | Freq: Three times a day (TID) | INTRAMUSCULAR | Status: AC | PRN

## 2015-01-20 MED ORDER — HEPARIN SODIUM (PORCINE) 5000 UNIT/ML IJ SOLN
5000.0000 [IU] | Freq: Three times a day (TID) | INTRAMUSCULAR | Status: DC
Start: 2015-01-20 — End: 2015-01-21
  Filled 2015-01-20 (×3): qty 1

## 2015-01-20 MED ORDER — ACETAMINOPHEN 650 MG RE SUPP: 650.0000 mg | Freq: Four times a day (QID) | RECTAL | Status: DC | PRN

## 2015-01-20 MED ORDER — DOXYCYCLINE HYCLATE 100 MG PO TABS
100.0000 mg | ORAL_TABLET | Freq: Two times a day (BID) | ORAL | Status: DC
  Administered 2015-01-20 – 2015-01-21 (×2): 100 mg via ORAL
  Filled 2015-01-20 (×3): qty 1

## 2015-01-20 MED ORDER — MOMETASONE FURO-FORMOTEROL FUM 100-5 MCG/ACT IN AERO
2.0000 | INHALATION_SPRAY | Freq: Two times a day (BID) | RESPIRATORY_TRACT | Status: DC
  Administered 2015-01-20 – 2015-01-21 (×2): 2 via RESPIRATORY_TRACT
  Filled 2015-01-20: qty 8.8

## 2015-01-20 MED ORDER — PREDNISONE 20 MG PO TABS
60.0000 mg | ORAL_TABLET | Freq: Once | ORAL | Status: AC
  Administered 2015-01-20: 60 mg via ORAL
  Filled 2015-01-20: qty 3

## 2015-01-20 MED ORDER — SODIUM CHLORIDE 0.9 % IJ SOLN: 3.0000 mL | INTRAMUSCULAR | Status: DC | PRN

## 2015-01-20 MED ORDER — SODIUM CHLORIDE 0.9 % IV SOLN: 250.0000 mL | INTRAVENOUS | Status: DC | PRN

## 2015-01-20 MED ORDER — GUAIFENESIN ER 600 MG PO TB12
600.0000 mg | ORAL_TABLET | Freq: Two times a day (BID) | ORAL | Status: DC
Start: 2015-01-20 — End: 2015-01-21
  Administered 2015-01-20 – 2015-01-21 (×2): 600 mg via ORAL
  Filled 2015-01-20 (×3): qty 1

## 2015-01-20 MED ORDER — SODIUM CHLORIDE 0.9 % IJ SOLN
3.0000 mL | Freq: Two times a day (BID) | INTRAMUSCULAR | Status: DC
  Administered 2015-01-20: 3 mL via INTRAVENOUS

## 2015-01-20 MED ORDER — IPRATROPIUM-ALBUTEROL 0.5-2.5 (3) MG/3ML IN SOLN
3.0000 mL | Freq: Once | RESPIRATORY_TRACT | Status: AC
  Administered 2015-01-20: 3 mL via RESPIRATORY_TRACT
  Filled 2015-01-20: qty 3

## 2015-01-20 MED ORDER — MEGESTROL ACETATE 40 MG PO TABS
40.0000 mg | ORAL_TABLET | Freq: Every day | ORAL | Status: DC
  Administered 2015-01-21: 40 mg via ORAL
  Filled 2015-01-20: qty 1

## 2015-01-20 MED ORDER — POTASSIUM CHLORIDE CRYS ER 20 MEQ PO TBCR
30.0000 meq | EXTENDED_RELEASE_TABLET | Freq: Two times a day (BID) | ORAL | Status: AC
Start: 2015-01-20 — End: 2015-01-21
  Administered 2015-01-20 – 2015-01-21 (×2): 30 meq via ORAL
  Filled 2015-01-20 (×2): qty 1

## 2015-01-20 MED ORDER — INSULIN ASPART 100 UNIT/ML ~~LOC~~ SOLN: 0.0000 [IU] | Freq: Three times a day (TID) | SUBCUTANEOUS | Status: DC

## 2015-01-20 MED ORDER — ALBUTEROL SULFATE (2.5 MG/3ML) 0.083% IN NEBU
2.5000 mg | INHALATION_SOLUTION | RESPIRATORY_TRACT | Status: DC | PRN
Start: 2015-01-20 — End: 2015-01-21

## 2015-01-20 MED ORDER — IPRATROPIUM-ALBUTEROL 0.5-2.5 (3) MG/3ML IN SOLN
3.0000 mL | RESPIRATORY_TRACT | Status: DC
  Administered 2015-01-20 – 2015-01-21 (×4): 3 mL via RESPIRATORY_TRACT
  Filled 2015-01-20 (×5): qty 3

## 2015-01-20 MED ORDER — NICOTINE 14 MG/24HR TD PT24
14.0000 mg | MEDICATED_PATCH | Freq: Every day | TRANSDERMAL | Status: DC
  Administered 2015-01-20 – 2015-01-21 (×2): 14 mg via TRANSDERMAL
  Filled 2015-01-20 (×2): qty 1

## 2015-01-20 MED ORDER — ZOLPIDEM TARTRATE 5 MG PO TABS
5.0000 mg | ORAL_TABLET | Freq: Every evening | ORAL | Status: DC | PRN
  Administered 2015-01-20: 5 mg via ORAL
  Filled 2015-01-20: qty 1

## 2015-01-20 MED ORDER — FERROUS SULFATE 325 (65 FE) MG PO TABS
325.0000 mg | ORAL_TABLET | Freq: Every day | ORAL | Status: DC
Start: 2015-01-21 — End: 2015-01-21
  Administered 2015-01-21: 325 mg via ORAL
  Filled 2015-01-20: qty 1

## 2015-01-20 MED ORDER — SODIUM CHLORIDE 0.9 % IV SOLN
INTRAVENOUS | Status: AC
  Administered 2015-01-20: via INTRAVENOUS
  Filled 2015-01-20: qty 1000

## 2015-01-20 MED ORDER — PREDNISONE 50 MG PO TABS
60.0000 mg | ORAL_TABLET | Freq: Every day | ORAL | Status: DC
  Administered 2015-01-21: 60 mg via ORAL
  Filled 2015-01-20 (×2): qty 1

## 2015-01-20 NOTE — ED Notes (Signed)
RT at the bedside to assess patient. Made aware of the need for breathing treatment.

## 2015-01-20 NOTE — H&P (Signed)
[facility] Admission History and Physical Service Pager: [phone]  Patient name: Hannah Baldwin Medical record number: [id number] Date of birth: [birth year] Age: 44 y.o. Gender: female  Primary Care Provider: No PCP Per Patient Consultants: None Code Status: Full  Chief Complaint: Shortness of Breath, Wheezing  Assessment and Plan: Hannah Baldwin is a 44 y.o. female presenting with chest tightness and wheezing x2 days. PMH is significant for Asthma  # Asthma Exacerbation/COPD Exacerbation: O2 Sat 100% in ED, even with ambulation. Tachcardic to 120s. Respiratory Rate 22-35. Seen by Respiratory Therapy in ED and given Duonebs x1, Prednisone 60mg , and continuous albuterol treatment without relief. History of smoking, ~10 cigarettes/day. Concern for undiagnosed COPD plus likely mixed restrictive disease given morbid obesity. WBC 10.7. CO2 15. CXR with cardiomegaly and no acute disease - Admit to [facility] Medicine Teaching Service, [place] attending. - Follow up BNP - Scheduled Duonebs q4hr - Albulterol q2hr PRN - start Dulera 100-5  - Doxycycline 100mg  BID - Prednisone 60mg  - PFTs as outpatient once back to baseline - Incentive Spirometry  # Hyperglycemia- Glucose 381. Has been on Prednisone since 3/28, which is suspected to be contributing. - Follow up A1C - Monitor CBGs - Sensitive Insulin Sliding Scale - Monitor amount of insulin daily, consider long acting insulin  # Hypokalemia: Potassium 3.4 - Kdur - Recheck BMP in am  # Acute Kidney Injury: Creatinine 1.15 today. Baseline 0.6-0.8. - NS @175  (maintenance 188) x12hr - Monitor fluid status. Echo 9/2 with 12-75% EF and no systolic dysfunction, so should be able to tolerate fluids - BMP in am  # Vaginal Bleeding: Resolved. Recently seen at [facility] on 3/28 and prescribed Iron and Megace. Hemoglobin improved to 8.1 today from 7.7.  - CBC in am - Continue Megace and Iron  # Tobacco Abuse:   Smokes 10cigarettes per day - Nicotine patch - Continue to counsel on smoking cessation  FEN/GI: Regular Diet, NS @ 175 x12hr Prophylaxis: Heparin  Disposition: Admit to [facility] Medicine Teaching Service. Discharge pending improvement of dyspnea. Will need PCP at discharge.  History of Present Illness: Hannah Baldwin is a 44 y.o. female presenting with chest tightness, shortness of breath, and wheezing for two days. Shortness of breath at rest and worse with ambulation. Unimproved with home nebulizer and Prednisone. States she has been using her nebulizer machine 3 times a day. Has also noted unproductive cough x1 week. Denies sick contacts. Denies fever. Admits to smoking about 10 cigarettes a day; reports this is "cut back" from previous 1 ppd use, as of about 1 month ago. Started smoking at 44yo. States she has never been able to lay down flat due to size of breasts and that she has always used 3-4 pillows to prop herself up on; has not noted any change in number of pillows needed to sleep. Was unable to lay down last night, stating she was restless and couldn't breathe. States vaginal bleeding has resolved.  Recently seen at [facility] for menorrhagia and Shortness of Breath x22months and was given prescription for Prednisone, Megace, Iron on 3/28. Noted vaginal bleeding with clots from January until that date, using 15 tampons daily. Now reports heavy bleeding has been resolved. Last asthma exacerbation 9/15 and was hospitalized for two days. Does not have PCP.  Review Of Systems: Per HPI Otherwise 12 point review of systems was performed and was unremarkable.  Patient Active Problem List   Diagnosis Date Noted  . COPD (chronic obstructive pulmonary  disease) 01/20/2015  . Cardiomegaly 06/09/2014  . Hypochromic microcytic anemia 06/08/2014  . Tobacco use disorder 01/01/2013  . Morbid obesity 01/01/2013  . Asthma exacerbation 12/31/2012   Past Medical History: Past Medical History   Diagnosis Date  . Asthma    Past Surgical History: Past Surgical History  Procedure Laterality Date  . Cesarean section     Social History: History  Substance Use Topics  . Smoking status: Current Some Day Smoker -- 0.50 packs/day    Types: Cigarettes    Last Attempt to Quit: 05/08/2014  . Smokeless tobacco: Not on file  . Alcohol Use: No     Comment: occasionally    Please also refer to relevant sections of EMR.  Family History: Family History  Problem Relation Age of Onset  . Asthma Mother   . Asthma Daughter    Allergies and Medications: No Known Allergies No current facility-administered medications on file prior to encounter.   Current Outpatient Prescriptions on File Prior to Encounter  Medication Sig Dispense Refill  . albuterol (PROVENTIL HFA;VENTOLIN HFA) 108 (90 BASE) MCG/ACT inhaler Inhale 2 puffs into the lungs every 6 (six) hours as needed for wheezing or shortness of breath.    Marland Kitchen albuterol (PROVENTIL) (2.5 MG/3ML) 0.083% nebulizer solution Take 2.5 mg by nebulization every 6 (six) hours as needed for wheezing or shortness of breath.    . ferrous sulfate 325 (65 FE) MG tablet Take 1 tablet (325 mg total) by mouth daily. 30 tablet 0  . megestrol (MEGACE) 40 MG tablet Take 1 tab (40 mg) TID x 3 days, then take 1 tab (40 mg) BID x 3 days then take 1 tab (40 mg) daily until follow-up visit 60 tablet 0  . predniSONE (DELTASONE) 10 MG tablet Take 40 mg (4 tabs) x 1 day, then 30 mg x 1 day, then 20mg  x 1 day, then 10 mg x 1 day0 10 tablet 0  . guaiFENesin-dextromethorphan (ROBITUSSIN DM) 100-10 MG/5ML syrup Take 5 mLs by mouth every 4 (four) hours as needed for cough. (Patient not taking: Reported on 01/03/2015) 118 mL 0  . nicotine (NICODERM CQ - DOSED IN MG/24 HOURS) 14 mg/24hr patch Place 1 patch (14 mg total) onto the skin daily. (Patient not taking: Reported on 01/03/2015) 28 patch 0  . omeprazole (PRILOSEC OTC) 20 MG tablet Take 1 tablet (20 mg total) by mouth  daily. (Patient not taking: Reported on 01/03/2015) 30 tablet 1    Objective: BP 125/65 mmHg  Pulse 106  Temp(Src) 98.6 F (37 C) (Oral)  Resp 34  Ht 5\' 3"  (1.6 m)  Wt 323 lb 11.2 oz (146.829 kg)  BMI 57.36 kg/m2  SpO2 100%  LMP 01/16/2015 Exam: General: 44yo female, mild dyspnea with conversation, no apparent distress HEENT: Moist mucous membranes, PERRLA Cardiovascular: S1 and S2 noted, no murmurs, regular rhythm, tachycardic Respiratory: Wheezing noted bilaterally, shortness of breath with conversation; able to speak in full but short sentences; breath sounds distant due to body habitus but grossly diminished breath sounds throughout Abdomen: Soft and nondistended, bowel sounds noted, no masses or tenderness to palpation Extremities: No edema noted, pulses palpated Skin: No rashes noted, warm Neuro: No focal deficits, AAOx3  Labs and Imaging: CBC BMET   Recent Labs Lab 01/20/15 1557  WBC 10.7*  HGB 8.1*  HCT 28.1*  PLT 806*    Recent Labs Lab 01/20/15 1557  NA 134*  K 3.4*  CL 105  CO2 15*  BUN 10  CREATININE 1.15*  GLUCOSE 381*  CALCIUM 8.5     Dg Chest 2 View  01/20/2015   CLINICAL DATA:  Shortness of breath for 2 weeks with wheezing. Chest pain for 3 days.  EXAM: CHEST  2 VIEW  COMPARISON:  PA and lateral chest 06/08/2014 and 01/08/2014.  FINDINGS: There is cardiomegaly without edema. Lungs are clear. No pneumothorax or pleural effusion.  IMPRESSION: Cardiomegaly without acute disease.   Electronically Signed   By: Inge Rise M.D.   On: 01/20/2015 16:29   Lorna Few, DO 01/20/2015, 10:15 PM PGY-1, [place] Intern pager: [phone], text pages welcome  FPTS Upper-Level Resident Addendum  I have independently interviewed and examined the patient. I have discussed the above with Dr. Gerlean Ren and agree with her documentation as above. The above reflects her original note with my edits for correction/additions/clarification in  orange. Please see also any attending notes.   Emmaline Kluver, MD PGY-3, [place] Service pager: [phone] (text pages welcome through [facility])

## 2015-01-20 NOTE — ED Notes (Signed)
Care given to [place], [place]

## 2015-01-20 NOTE — ED Notes (Signed)
Paged Dr. Gerlean Ren to [place], RN

## 2015-01-20 NOTE — Consult Note (Signed)
PULMONARY / CRITICAL CARE MEDICINE   Name: Hannah Baldwin MRN: [id number] DOB: 04/04/1971    ADMISSION DATE:  01/20/2015 CONSULTATION DATE:  01/20/2015  REFERRING MD :  EDP  CHIEF COMPLAINT:  Shortness of breath  INITIAL PRESENTATION: 44 year old female who smokes cigarettes came to the emergency department on 01/20/2015 complaining of shortness of breath. Pulmonary critical care medicine consulted for asthma.  STUDIES:    SIGNIFICANT EVENTS:    HISTORY OF PRESENT ILLNESS:  This is a very pleasant morbidly obese 44 year old female who smokes cigarettes and has a history of asthma and no primary care physician who came to the emergency department today because of increasing shortness of breath. She says that in the last several weeks she has been experiencing increasing allergy symptoms and increasing shortness of breath. She describes cough which is typically nonproductive as well as wheezing. She was seen in the [facility] emergency department approximately 2 weeks ago for some vaginal bleeding and during that time she complained of shortness of breath. She was prescribed prednisone and was given prescription for an inhaler. She says that she does not have a primary care physician who prescribes inhalers for her. At home she has "a blue inhaler and a red inhaler". She has been using those recently. In the emergency department she was given 2 doses of albuterol nebulized and her symptoms did not improve.  PAST MEDICAL HISTORY :   has a past medical history of Asthma.  has past surgical history that includes Cesarean section. Prior to Admission medications   Medication Sig Start Date End Date Taking? Authorizing Provider  albuterol (PROVENTIL HFA;VENTOLIN HFA) 108 (90 BASE) MCG/ACT inhaler Inhale 2 puffs into the lungs every 6 (six) hours as needed for wheezing or shortness of breath.   Yes Historical Provider, MD  albuterol (PROVENTIL) (2.5 MG/3ML) 0.083% nebulizer solution Take 2.5  mg by nebulization every 6 (six) hours as needed for wheezing or shortness of breath.   Yes Historical Provider, MD  diphenhydramine-acetaminophen (TYLENOL PM) 25-500 MG TABS Take 2 tablets by mouth at bedtime as needed (sleep).   Yes Historical Provider, MD  ferrous sulfate 325 (65 FE) MG tablet Take 1 tablet (325 mg total) by mouth daily. 01/03/15  Yes Luvenia Redden, PA-C  ibuprofen (ADVIL,MOTRIN) 200 MG tablet Take 400 mg by mouth every 6 (six) hours as needed for headache.   Yes Historical Provider, MD  megestrol (MEGACE) 40 MG tablet Take 1 tab (40 mg) TID x 3 days, then take 1 tab (40 mg) BID x 3 days then take 1 tab (40 mg) daily until follow-up visit 01/03/15  Yes Luvenia Redden, PA-C  OVER THE COUNTER MEDICATION Place 1 drop into both nostrils daily as needed. OTC nasal allergy drops   Yes Historical Provider, MD  predniSONE (DELTASONE) 10 MG tablet Take 40 mg (4 tabs) x 1 day, then 30 mg x 1 day, then 20mg  x 1 day, then 10 mg x 1 day0 01/03/15  Yes Luvenia Redden, PA-C  guaiFENesin-dextromethorphan (ROBITUSSIN DM) 100-10 MG/5ML syrup Take 5 mLs by mouth every 4 (four) hours as needed for cough. Patient not taking: Reported on 01/03/2015 06/11/14   Annita Brod, MD  nicotine (NICODERM CQ - DOSED IN MG/24 HOURS) 14 mg/24hr patch Place 1 patch (14 mg total) onto the skin daily. Patient not taking: Reported on 01/03/2015 06/11/14   Annita Brod, MD  omeprazole (PRILOSEC OTC) 20 MG tablet Take 1 tablet (20 mg total) by mouth  daily. Patient not taking: Reported on 01/03/2015 06/11/14   Annita Brod, MD   No Known Allergies  FAMILY HISTORY:  has no family status information on file.  SOCIAL HISTORY:  reports that she has been smoking Cigarettes.  She has been smoking about 0.50 packs per day. She does not have any smokeless tobacco history on file. She reports that she does not drink alcohol or use illicit drugs.  REVIEW OF SYSTEMS:   Gen: Denies fever, chills, weight change, fatigue,  night sweats HEENT: Denies blurred vision, double vision, hearing loss, tinnitus, + sinus congestion, + rhinorrhea, sore throat, neck stiffness, dysphagia PULM: per HPI CV: Denies chest pain, edema, orthopnea, paroxysmal nocturnal dyspnea, palpitations GI: Denies abdominal pain, nausea, vomiting, diarrhea, hematochezia, melena, constipation, change in bowel habits GU: Denies dysuria, hematuria, polyuria, oliguria, urethral discharge Endocrine: Denies hot or cold intolerance, polyuria, polyphagia or appetite change Derm: Denies rash, dry skin, scaling or peeling skin change Heme: Denies easy bruising, bleeding, bleeding gums Neuro: Denies headache, numbness, weakness, slurred speech, loss of memory or consciousness   SUBJECTIVE:   VITAL SIGNS: Temp:  [98.2 F (36.8 C)-98.4 F (36.9 C)] 98.2 F (36.8 C) (04/14 1424) Pulse Rate:  [102-124] 121 (04/14 1430) Resp:  [22-35] 22 (04/14 1430) BP: (144-153)/(80-86) 153/85 mmHg (04/14 1424) SpO2:  [100 %] 100 % (04/14 1430) HEMODYNAMICS:   VENTILATOR SETTINGS:   INTAKE / OUTPUT: No intake or output data in the 24 hours ending 01/20/15 1628  PHYSICAL EXAMINATION: General:  Mild tachypnea, no acute distress Neuro:  Awake and alert, oriented 4, moves all extremities well HEENT:  NCAT, pupils equal round reactive to light, EOMI, oropharynx clear Cardiovascular:  Mildly tachycardic, distant heart sounds, cannot assess JVD due to obesity Lungs:  Wheezing bilaterally, speaking in full sentences, no paradoxical respirations, no accessory muscle use Abdomen:  Bowel sounds are positive, nontender nondistended Musculoskeletal:  Normal bulk and tone Skin:  No rash or skin breakdown  LABS:  CBC  Recent Labs Lab 01/20/15 1557  WBC 10.7*  HGB 8.1*  HCT 28.1*  PLT 806*   Coag's No results for input(s): APTT, INR in the last 168 hours. BMET No results for input(s): NA, K, CL, CO2, BUN, CREATININE, GLUCOSE in the last 168  hours. Electrolytes No results for input(s): CALCIUM, MG, PHOS in the last 168 hours. Sepsis Markers No results for input(s): LATICACIDVEN, PROCALCITON, O2SATVEN in the last 168 hours. ABG No results for input(s): PHART, PCO2ART, PO2ART in the last 168 hours. Liver Enzymes No results for input(s): AST, ALT, ALKPHOS, BILITOT, ALBUMIN in the last 168 hours. Cardiac Enzymes No results for input(s): TROPONINI, PROBNP in the last 168 hours. Glucose No results for input(s): GLUCAP in the last 168 hours.  Imaging No results found.   ASSESSMENT / PLAN:  PULMONARY  A: Acute exacerbation of asthma> currently not showing signs of respiratory failure but does have mild increased work of breathing. I agree with admission to the hospital. Clearly this problem could've been prevented if she did not smoke cigarettes and if she had a primary care physician who could prescribe preventative inhalers such as inhaled corticosteroids. Tobacco abuse P:   Admit to inpatient Prednisone 60 mg daily, would taper off over 10 days I will start Symbicort twice a day Albuterol nebulizers every 4 hours and when necessary Needs outpatient pulmonary function test Needs to quit smoking, continue to counsel to quit and offer smoking cessation classes at discharge Needs primary care physician Obtain chest x-ray,  I will order   Pulmonary and critical care medicine will be available to see this patient if her condition worsens, for now we will sign off.  Roselie Awkward, MD [facility] Pager: [phone] Cell: [phone] If no response, call [phone]  01/20/2015, 4:28 PM

## 2015-01-20 NOTE — ED Notes (Signed)
Per Patient, Patient coming from home with complaints of shortness of breath that has been going on for two days. Patient has a history of asthma and [facility]. Patient was having SOB at the time and was given a breathing treatment and prescription for prednisone by MD. Patient has taken prednisone and utilized home nebulizer, but has note received relief. Patient presents with active wheezing and dyspnea at rest.

## 2015-01-20 NOTE — ED Notes (Signed)
Patient walked in the hallway. Patient's oxygen stayed at 100% during ambulating. HR 117-127 HR with ambulation.

## 2015-01-20 NOTE — ED Provider Notes (Signed)
CSN: [id number]     Arrival date & time 01/20/15  1118 History   First MD Initiated Contact with Patient 01/20/15 1119     Chief Complaint  Patient presents with  . Shortness of Breath     (Consider location/radiation/quality/duration/timing/severity/associated sxs/prior Treatment) Patient is a 44 y.o. female presenting with wheezing. The history is provided by the patient. No language interpreter was used.  Wheezing Severity:  Severe Severity compared to prior episodes:  More severe Onset quality:  Sudden Duration:  2 days Timing:  Constant Progression:  Unchanged Chronicity:  Recurrent Context: not animal exposure, not dust, not exposure to allergen, not fumes, not medical treatments, not pet dander, not pollens, not smoke exposure, not strong odors and not tartrazine   Relieved by:  Nothing Worsened by:  Nothing tried Ineffective treatments:  Oral steroids and beta-agonist inhaler Associated symptoms: chest tightness   Associated symptoms: no chest pain, no cough, no fatigue, no foot swelling, no orthopnea, no rhinorrhea, no shortness of breath, no sore throat and no sputum production   Risk factors: not exposed to toxic fumes, no prior hospitalizations, no prior ICU admissions, no prior intubations, no smoke inhalation and no suspected foreign body     Past Medical History  Diagnosis Date  . Asthma    Past Surgical History  Procedure Laterality Date  . Cesarean section     Family History  Problem Relation Age of Onset  . Asthma Mother   . Asthma Daughter    History  Substance Use Topics  . Smoking status: Current Some Day Smoker -- 0.50 packs/day    Types: Cigarettes    Last Attempt to Quit: 05/08/2014  . Smokeless tobacco: Not on file  . Alcohol Use: No     Comment: occasionally    OB History    Gravida Para Term Preterm AB TAB SAB Ectopic Multiple Living   2 2 0 0 0 0 0 0 0 2      Review of Systems  Constitutional: Negative for chills and fatigue.  HENT:  Negative for rhinorrhea, sore throat and trouble swallowing.   Eyes: Negative for visual disturbance.  Respiratory: Positive for chest tightness and wheezing. Negative for cough, sputum production and shortness of breath.   Cardiovascular: Negative for chest pain, palpitations and orthopnea.  Gastrointestinal: Negative for nausea and diarrhea.  Genitourinary: Negative for dysuria and difficulty urinating.  Musculoskeletal: Negative for arthralgias.  Skin: Negative for color change.  Neurological: Negative for dizziness and weakness.  Psychiatric/Behavioral: Negative for dysphoric mood.      Allergies  Review of patient's allergies indicates no known allergies.  Home Medications   Prior to Admission medications   Medication Sig Start Date End Date Taking? Authorizing Provider  albuterol (PROVENTIL HFA;VENTOLIN HFA) 108 (90 BASE) MCG/ACT inhaler Inhale 2 puffs into the lungs every 6 (six) hours as needed for wheezing or shortness of breath.    Historical Provider, MD  albuterol (PROVENTIL) (2.5 MG/3ML) 0.083% nebulizer solution Take 2.5 mg by nebulization every 6 (six) hours as needed for wheezing or shortness of breath.    Historical Provider, MD  ferrous sulfate 325 (65 FE) MG tablet Take 1 tablet (325 mg total) by mouth daily. 01/03/15   Luvenia Redden, PA-C  guaiFENesin-dextromethorphan (ROBITUSSIN DM) 100-10 MG/5ML syrup Take 5 mLs by mouth every 4 (four) hours as needed for cough. Patient not taking: Reported on 01/03/2015 06/11/14   Annita Brod, MD  megestrol (MEGACE) 40 MG tablet Take 1 tab (40 mg)  TID x 3 days, then take 1 tab (40 mg) BID x 3 days then take 1 tab (40 mg) daily until follow-up visit 01/03/15   Luvenia Redden, PA-C  nicotine (NICODERM CQ - DOSED IN MG/24 HOURS) 14 mg/24hr patch Place 1 patch (14 mg total) onto the skin daily. Patient not taking: Reported on 01/03/2015 06/11/14   Annita Brod, MD  omeprazole (PRILOSEC OTC) 20 MG tablet Take 1 tablet (20 mg total)  by mouth daily. Patient not taking: Reported on 01/03/2015 06/11/14   Annita Brod, MD  predniSONE (DELTASONE) 10 MG tablet Take 40 mg (4 tabs) x 1 day, then 30 mg x 1 day, then 20mg  x 1 day, then 10 mg x 1 day0 01/03/15   Luvenia Redden, PA-C   BP 153/80 mmHg  Pulse 102  Temp(Src) 98.4 F (36.9 C) (Oral)  Resp 35  Ht 5\' 3"  (1.6 m)  SpO2 100%  LMP 11/04/2014 (Approximate) Physical Exam  Constitutional: She is oriented to person, place, and time. She appears well-developed and well-nourished. No distress.  HENT:  Head: Normocephalic and atraumatic.  Eyes: Conjunctivae and EOM are normal.  Neck: Normal range of motion.  Cardiovascular: Normal rate and regular rhythm.  Exam reveals no gallop and no friction rub.   No murmur heard. Pulmonary/Chest: She has wheezes. She has no rales. She exhibits no tenderness.  Increased breathing effort. Inspiratory and expiratory wheezing in all lung fields.   Abdominal: Soft. She exhibits no distension. There is no tenderness. There is no rebound.  Musculoskeletal: Normal range of motion.  Neurological: She is alert and oriented to person, place, and time. Coordination normal.  Speech is goal-oriented. Moves limbs without ataxia.   Skin: Skin is warm and dry.  Psychiatric: She has a normal mood and affect. Her behavior is normal.  Nursing note and vitals reviewed.   ED Course  Procedures (including critical care time) Labs Review Labs Reviewed  CBC WITH DIFFERENTIAL/PLATELET - Abnormal; Notable for the following:    WBC 10.7 (*)    Hemoglobin 8.1 (*)    HCT 28.1 (*)    MCV 69.2 (*)    MCH 20.0 (*)    MCHC 28.8 (*)    RDW 19.1 (*)    Platelets 806 (*)    Neutrophils Relative % 92 (*)    Lymphocytes Relative 6 (*)    Monocytes Relative 2 (*)    Neutro Abs 9.9 (*)    Lymphs Abs 0.6 (*)    All other components within normal limits  BASIC METABOLIC PANEL - Abnormal; Notable for the following:    Sodium 134 (*)    Potassium 3.4 (*)     CO2 15 (*)    Glucose, Bld 381 (*)    Creatinine, Ser 1.15 (*)    GFR calc non Af Amer 57 (*)    GFR calc Af Amer 67 (*)    All other components within normal limits  BASIC METABOLIC PANEL - Abnormal; Notable for the following:    Glucose, Bld 327 (*)    Creatinine, Ser 1.13 (*)    GFR calc non Af Amer 59 (*)    GFR calc Af Amer 68 (*)    All other components within normal limits  CBC - Abnormal; Notable for the following:    WBC 12.3 (*)    RBC 3.60 (*)    Hemoglobin 7.0 (*)    HCT 25.0 (*)    MCV 69.4 (*)  MCH 19.4 (*)    MCHC 28.0 (*)    RDW 19.2 (*)    Platelets 742 (*)    All other components within normal limits  LIPID PANEL - Abnormal; Notable for the following:    HDL 37 (*)    All other components within normal limits  GLUCOSE, CAPILLARY - Abnormal; Notable for the following:    Glucose-Capillary 366 (*)    All other components within normal limits  GLUCOSE, CAPILLARY - Abnormal; Notable for the following:    Glucose-Capillary 246 (*)    All other components within normal limits  GLUCOSE, CAPILLARY - Abnormal; Notable for the following:    Glucose-Capillary 240 (*)    All other components within normal limits  BRAIN NATRIURETIC PEPTIDE  INFLUENZA PANEL BY PCR (TYPE A & B, H1N1)  HEMOGLOBIN A1C  MICROALBUMIN / CREATININE URINE RATIO    CRITICAL CARE Performed by: Alvina Chou   Total critical care time: 30 minutes  Critical care time was exclusive of separately billable procedures and treating other patients.  Critical care was necessary to treat or prevent imminent or life-threatening deterioration.  Critical care was time spent personally by me on the following activities: development of treatment plan with patient and/or surrogate as well as nursing, discussions with consultants, evaluation of patient's response to treatment, examination of patient, obtaining history from patient or surrogate, ordering and performing treatments and interventions,  ordering and review of laboratory studies, ordering and review of radiographic studies, pulse oximetry and re-evaluation of patient's condition.   Imaging Review Dg Chest 2 View  01/20/2015   CLINICAL DATA:  Shortness of breath for 2 weeks with wheezing. Chest pain for 3 days.  EXAM: CHEST  2 VIEW  COMPARISON:  PA and lateral chest 06/08/2014 and 01/08/2014.  FINDINGS: There is cardiomegaly without edema. Lungs are clear. No pneumothorax or pleural effusion.  IMPRESSION: Cardiomegaly without acute disease.   Electronically Signed   By: Inge Rise M.D.   On: 01/20/2015 16:29     EKG Interpretation None      MDM   Final diagnoses:  SOB (shortness of breath)  Asthma exacerbation    11:53 AM Patient given prednisone and will have hour long nebulizer treatment. Patient is tachycardic and tachypneic with generalized wheezing.   2:45 PM Patient has not improved with multiple albuterol nebulizer treatments including continuous. Patient will be admitted.    Alvina Chou, PA-C 01/21/15 [place], MD 01/21/15 2150

## 2015-01-21 DIAGNOSIS — J4540 Moderate persistent asthma, uncomplicated: Secondary | ICD-10-CM | POA: Diagnosis present

## 2015-01-21 DIAGNOSIS — N921 Excessive and frequent menstruation with irregular cycle: Secondary | ICD-10-CM

## 2015-01-21 LAB — BASIC METABOLIC PANEL
Anion gap: 11 (ref 5–15)
BUN: 11 mg/dL (ref 6–23)
CALCIUM: 9.1 mg/dL (ref 8.4–10.5)
CO2: 22 mmol/L (ref 19–32)
Chloride: 104 mmol/L (ref 96–112)
Creatinine, Ser: 1.13 mg/dL — ABNORMAL HIGH (ref 0.50–1.10)
GFR calc Af Amer: 68 mL/min — ABNORMAL LOW (ref >90)
GFR, EST NON AFRICAN AMERICAN: 59 mL/min — AB (ref >90)
Glucose, Bld: 327 mg/dL — ABNORMAL HIGH (ref 70–99)
Potassium: 3.7 mmol/L (ref 3.5–5.1)
Sodium: 137 mmol/L (ref 135–145)

## 2015-01-21 LAB — CBC
HCT: 25 % — ABNORMAL LOW (ref 36.0–46.0)
HEMOGLOBIN: 7 g/dL — AB (ref 12.0–15.0)
MCH: 19.4 pg — AB (ref 26.0–34.0)
MCHC: 28 g/dL — AB (ref 30.0–36.0)
MCV: 69.4 fL — ABNORMAL LOW (ref 78.0–100.0)
Platelets: 742 10*3/uL — ABNORMAL HIGH (ref 150–400)
RBC: 3.6 MIL/uL — ABNORMAL LOW (ref 3.87–5.11)
RDW: 19.2 % — AB (ref 11.5–15.5)
WBC: 12.3 10*3/uL — ABNORMAL HIGH (ref 4.0–10.5)

## 2015-01-21 LAB — INFLUENZA PANEL BY PCR (TYPE A & B)
H1N1FLUPCR: NOT DETECTED
Influenza A By PCR: NEGATIVE
Influenza B By PCR: NEGATIVE

## 2015-01-21 LAB — GLUCOSE, CAPILLARY
Glucose-Capillary: 246 mg/dL — ABNORMAL HIGH (ref 70–99)
Glucose-Capillary: 240 mg/dL — ABNORMAL HIGH (ref 70–99)

## 2015-01-21 MED ORDER — PREDNISONE 20 MG PO TABS: 60.0000 mg | ORAL_TABLET | Freq: Every day | ORAL | Status: DC

## 2015-01-21 MED ORDER — ALBUTEROL SULFATE (2.5 MG/3ML) 0.083% IN NEBU: 2.5000 mg | INHALATION_SOLUTION | RESPIRATORY_TRACT | Status: DC | PRN

## 2015-01-21 MED ORDER — DOXYCYCLINE HYCLATE 100 MG PO TABS: 100.0000 mg | ORAL_TABLET | Freq: Two times a day (BID) | ORAL | Status: AC

## 2015-01-21 MED ORDER — MOMETASONE FURO-FORMOTEROL FUM 100-5 MCG/ACT IN AERO: 2.0000 | INHALATION_SPRAY | Freq: Two times a day (BID) | RESPIRATORY_TRACT | Status: DC

## 2015-01-21 MED ORDER — DOXYCYCLINE HYCLATE 100 MG PO TABS: 100.0000 mg | ORAL_TABLET | Freq: Two times a day (BID) | ORAL | Status: DC

## 2015-01-21 NOTE — Progress Notes (Signed)
Unable to finish pt's assessment. Pt refused to let nurse finish assessing her until she spoke with the charge nurse regarding her medications. Pt upset about medications being taken to pharmacy. Explained to pt hospital policy about home medications and the process. Pt still upset. Spoke with charge nurse and he'll go in to speak with pt and give her morning medications.

## 2015-01-21 NOTE — Progress Notes (Signed)
Advised pt's new nurse that assessment hadn't been done and nurse will assess pt.

## 2015-01-21 NOTE — Progress Notes (Signed)
Inpatient Diabetes Program Recommendations  AACE/ADA: New Consensus Statement on Inpatient Glycemic Control (2013)  Target Ranges:  Prepandial:   less than 140 mg/dL      Peak postprandial:   less than 180 mg/dL (1-2 hours)      Critically ill patients:  140 - 180 mg/dL   Results for Hannah Baldwin, Hannah Baldwin (MRN [id number]) as of 01/21/2015 09:05  Ref. Range 01/20/2015 20:51 01/21/2015 08:06  Glucose-Capillary Latest Ref Range: 70-99 mg/dL 366 (H) 246 (H)     Reason for assessment: elevated CBG   Diabetes history: none noted Outpatient Diabetes medications: none Current orders for Inpatient glycemic control: Novolog 0-9 units tid  A1C pending.    Consider increasing Novolog correction to Novolog resistant correction tid and hs (based on steroids and patient's weight).  Please consider starting this patient on Lantus 15 units (0.1u/kg) q day beginning this evening if Novolog correction is inadequate in improving CBG.    Gentry Fitz, RN, BA, MHA, CDE Diabetes Coordinator Inpatient Diabetes Program  [phone] (Team Pager) [phone] [facility]) 01/21/2015 9:09 AM

## 2015-01-21 NOTE — Discharge Summary (Signed)
[facility] Discharge Summary  Patient name: Hannah Baldwin Medical record number: [id number] Date of birth: 07-10-1971 Age: 44 y.o. Gender: female Date of Admission: 01/20/2015  Date of Discharge: 01/21/2015  Admitting Physician: Zenia Resides, MD  Primary Care Provider: No PCP Per Patient Consultants: None  Indication for Hospitalization:  Asthma exacerbation  Discharge Diagnoses/Problem List:  Patient Active Problem List   Diagnosis Date Noted  . Menometrorrhagia 01/21/2015  . COPD (chronic obstructive pulmonary disease) 01/20/2015  . Cardiomegaly 06/09/2014  . Hypochromic microcytic anemia 06/08/2014  . Tobacco use disorder 01/01/2013  . Morbid obesity 01/01/2013  . Asthma exacerbation 12/31/2012    Disposition: To home  Discharge Condition: Stable  Discharge Exam:   Temp: [98.2 F (36.8 C)-98.6 F (37 C)] 98.3 F (36.8 C) (04/15 0556) Pulse Rate: [96-124] 96 (04/15 0556) Resp: [22-35] 32 (04/15 0556) BP: (118-156)/(53-86) 139/53 mmHg (04/15 0556) SpO2: [99 %-100 %] 99 % (04/15 0814) FiO2 (%): [0 %] 0 % (04/14 2007) Weight: [323 lb 11.2 oz (146.829 kg)] 323 lb 11.2 oz (146.829 kg) (04/14 1933) Physical Exam: General: NAD HEENT: NCAT, MMM Cardiovascular:RRR Respiratory: good air movement but distant air sounds 2/2 body habitus, few scattered wheezes,  Abdomen: Soft and nondistended, bowel sounds noted, no masses or tenderness to palpation Extremities: No edema noted, pulses palpated Skin: No rashes noted, warm Neuro: No focal deficits, AOx3   Brief Hospital Course:   GEORGIANA Baldwin is a 44 y.o. female presenting with chest tightness and wheezing x2 days. PMH is significant for Asthma, menometrorrhagia  Dyspnea: On admission she reported significant shortness of breath with chest tightness. As she has a history of asthma this was concerning for an astma exacerbation. She additionally has a significant COPD history with  potential for exacerbation. CXR was significant for mild Cardiomegally which was unchanged from prior CXR in 06/2014. She had an echo at that time which was largely normal and showed a. EF of 60-65%. BNP was 41.7. She was treated for asthma with potential COPD component with prednisone 60 to complete a 5 day course, doxycycline to complete a 5 day course, duonebs and albuterol PRN. As she did not have her her own medication at home secondary to not having a PCP she was started on Dulera BID. Her symptoms began to resolve by the day of discharge.    Hyperglycemia- On admission she was hyperglycemic likely secondary to steroids she was previously taking for asthma exacerbation. However, given her morbid obesity there was concern for diabetes and hgb A1c was order ans was pending by day of discharge . She was maintained on sliding scale insulin while inpatient  Hypokalemia:  On admission her potassium 3.4 and after repletion improved to 3.7   # Acute Kidney Injury:  On admission her creatinine was 1.15 with a documented. Cr of  0.6-0.8  7 months ago. There was concern that this was secondary to fluids but after IV fluid hydration she her CR was still elevated to 1.13. As there is some concern that she has underlying diabetes, she had a Hgb A1c checked. Consider checking urine microalbumin as an outpatient.   # Vaginal Bleeding: Resolved.  She has a history of menometrorrhagia and was recenlty seen at [facility] on 3/28 and prescribed Iron and Megace. Her vaginal bleeding stopped prior to admission and her hemoglobin improved to 8.1 at admission from 7.7.    # Tobacco Abuse: As she has a longstanding smoking history she was maintained on  a nicotine patch     # Morbid Obesity She was extensively counseled on the risk that obesity has for menometrorrhagia, menorrhagia, uterine cancer, diabetes. She was also counseled on the importance of daily exercise and healthy diet.   #FEN/GI:   Maintained on a Regular Diet   #Prophylaxis:  She was maintained on subcutaneous Heparin without issue  Disposition: Pending clinical improvement   Issues for Follow Up:  1. Consider testing urine albumin 2. Pulmonary function testing as an outpatient 3. Pelvic US as an outpatient 4. Endometrial biopsy 5. Polysomnography to assess for sleep apnea  Significant Procedures: None  Significant Labs and Imaging:   Recent Labs Lab 01/20/15 1557 01/21/15 0530  WBC 10.7* 12.3*  HGB 8.1* 7.0*  HCT 28.1* 25.0*  PLT 806* 742*    Recent Labs Lab 01/20/15 1557 01/21/15 0530  NA 134* 137  K 3.4* 3.7  CL 105 104  CO2 15* 22  GLUCOSE 381* 327*  BUN 10 11  CREATININE 1.15* 1.13*  CALCIUM 8.5 9.1    Results/Tests Pending at Time of Discharge: HgbA1C  Discharge Medications:    Medication List    ASK your doctor about these medications        albuterol 108 (90 BASE) MCG/ACT inhaler  Commonly known as:  PROVENTIL HFA;VENTOLIN HFA  Inhale 2 puffs into the lungs every 6 (six) hours as needed for wheezing or shortness of breath.     albuterol (2.5 MG/3ML) 0.083% nebulizer solution  Commonly known as:  PROVENTIL  Take 2.5 mg by nebulization every 6 (six) hours as needed for wheezing or shortness of breath.     diphenhydramine-acetaminophen 25-500 MG Tabs  Commonly known as:  TYLENOL PM  Take 2 tablets by mouth at bedtime as needed (sleep).     ferrous sulfate 325 (65 FE) MG tablet  Take 1 tablet (325 mg total) by mouth daily.     guaiFENesin-dextromethorphan 100-10 MG/5ML syrup  Commonly known as:  ROBITUSSIN DM  Take 5 mLs by mouth every 4 (four) hours as needed for cough.     ibuprofen 200 MG tablet  Commonly known as:  ADVIL,MOTRIN  Take 400 mg by mouth every 6 (six) hours as needed for headache.     megestrol 40 MG tablet  Commonly known as:  MEGACE  Take 1 tab (40 mg) TID x 3 days, then take 1 tab (40 mg) BID x 3 days then take 1 tab (40 mg) daily until  follow-up visit     nicotine 14 mg/24hr patch  Commonly known as:  NICODERM CQ - dosed in mg/24 hours  Place 1 patch (14 mg total) onto the skin daily.     omeprazole 20 MG tablet  Commonly known as:  PRILOSEC OTC  Take 1 tablet (20 mg total) by mouth daily.     OVER THE COUNTER MEDICATION  Place 1 drop into both nostrils daily as needed. OTC nasal allergy drops     predniSONE 10 MG tablet  Commonly known as:  DELTASONE  Take 40 mg (4 tabs) x 1 day, then 30 mg x 1 day, then 20mg  x 1 day, then 10 mg x 1 day0        Discharge Instructions: Please refer to Patient Instructions section of EMR for full details.  Patient was counseled important signs and symptoms that should prompt return to medical care, changes in medications, dietary instructions, activity restrictions, and follow up appointments.   Follow-Up Appointments:     Follow-up  Information    Follow up with [place]     On 01/25/2015.   Why:  11:30 am for hospital follow up,    Contact information:   [place] [address] [phone]      Veatrice Bourbon, MD 01/21/2015, 1:59 PM PGY-1, [place]

## 2015-01-21 NOTE — Progress Notes (Signed)
Family Medicine Teaching Service Daily Progress Note Intern Pager: [phone]  Patient name: Hannah Baldwin Medical record number: [id number] Date of birth: 1971/05/28 Age: 44 y.o. Gender: female  Primary Care Provider: No PCP Per Patient Consultants: None Code Status: Full  Pt Overview and Major Events to Date:  Hannah Baldwin is a 44 y.o. female presenting with chest tightness and wheezing x2 days. PMH is significant for Asthma, menometrorrhagia  Assessment and Plan:  # Asthma Exacerbation/COPD Exacerbation:  Symptoms consistent with likely asthma exacerbation in setting of no home asthma controller meds. As she has a long history of tobacco use, CPOD also likely at  [place]. Also consider restrictive disease 2/2 morbid obesity. CXR in the ED neg for process.  - BNP 41.7, neg - Scheduled Duonebs q4hr - Albulterol q2hr PRN - start Dulera 100-5  - Doxycycline 100mg  BID - Prednisone 60mg  - PFTs as outpatient once back to baseline - Incentive Spirometry  # Hyperglycemia- Hyperglycemia likely worsened by steroid use, as she has been on Prednisone since 3/28 also for asthma, but likley underlying component of insulin insentitivity - Follow up A1C - Monitor CBGs - Sensitive Insulin Sliding Scale - Monitor amount of insulin daily, consider long acting insulin  # Hypokalemia: Potassium 3.4 - s/p Kdur - improved to 3/7 this AM  # Acute Kidney Injury: Creatinine 1.15 on admission. Baseline 0.6-0.8. - Repeat 1.13, will get microalbumin to rule out nephropathy - s/p NS @175  (maintenance 188) x12hr - Monitor fluid status. Echo 9/2 with 88-50% EF and no systolic dysfunction, so should be able to tolerate fluids   # Vaginal Bleeding: Resolved. Recently seen at [facility] on 3/28 and prescribed Iron and Megace. Hemoglobin improved to 8.1 today from 7.7.  - CBC in am - Continue Megace and Iron - [place] ordered as an outpatient, consider inpatient scan, b  Pt does not want [place] today  #  Tobacco Abuse: Smokes 10cigarettes per day - Nicotine patch - Continue to counsel on smoking cessation  #FEN/GI: Regular Diet, KVO #Prophylaxis: Heparin  Disposition: Pending clinical improvement   Subjective:  improved breathing but still feels SOB, still chest tightness feels  Objective: Temp:  [98.2 F (36.8 C)-98.6 F (37 C)] 98.3 F (36.8 C) (04/15 0556) Pulse Rate:  [96-124] 96 (04/15 0556) Resp:  [22-35] 32 (04/15 0556) BP: (118-156)/(53-86) 139/53 mmHg (04/15 0556) SpO2:  [99 %-100 %] 99 % (04/15 0814) FiO2 (%):  [0 %] 0 % (04/14 2007) Weight:  [323 lb 11.2 oz (146.829 kg)] 323 lb 11.2 oz (146.829 kg) (04/14 1933) Physical Exam: General: NAD HEENT: NCAT, MMM Cardiovascular:RRR Respiratory: good air movement but distant air sounds 2/2 body habitus, few scattered wheezes,  Abdomen: Soft and nondistended, bowel sounds noted, no masses or tenderness to palpation Extremities: No edema noted, pulses palpated Skin: No rashes noted, warm Neuro: No focal deficits, AOx3  Laboratory:  Recent Labs Lab 01/20/15 1557 01/21/15 0530  WBC 10.7* 12.3*  HGB 8.1* 7.0*  HCT 28.1* 25.0*  PLT 806* 742*    Recent Labs Lab 01/20/15 1557 01/21/15 0530  NA 134* 137  K 3.4* 3.7  CL 105 104  CO2 15* 22  BUN 10 11  CREATININE 1.15* 1.13*  CALCIUM 8.5 9.1  GLUCOSE 381* 327*      Imaging/Diagnostic Tests: CXR 4/14  FINDINGS: There is cardiomegaly without edema. Lungs are clear. No pneumothorax or pleural effusion.  IMPRESSION: Cardiomegaly without acute disease.  Veatrice Bourbon, MD 01/21/2015, 8:30 AM PGY-1, [facility]  [place] Intern pager: [phone], text pages welcome

## 2015-01-22 LAB — HEMOGLOBIN A1C
HEMOGLOBIN A1C: 7.2 % — AB (ref 4.8–5.6)
Mean Plasma Glucose: 160 mg/dL

## 2015-01-25 ENCOUNTER — Ambulatory Visit: Payer: Self-pay

## 2015-01-25 ENCOUNTER — Ambulatory Visit: Payer: Self-pay | Attending: Family Medicine | Admitting: Family Medicine

## 2015-01-25 ENCOUNTER — Encounter: Payer: Self-pay | Admitting: Family Medicine

## 2015-01-25 VITALS — BP 123/82 | HR 88 | Temp 98.2°F | Resp 20 | Ht 63.0 in | Wt 314.0 lb

## 2015-01-25 DIAGNOSIS — Z794 Long term (current) use of insulin: Secondary | ICD-10-CM | POA: Insufficient documentation

## 2015-01-25 DIAGNOSIS — E119 Type 2 diabetes mellitus without complications: Secondary | ICD-10-CM

## 2015-01-25 DIAGNOSIS — E669 Obesity, unspecified: Secondary | ICD-10-CM | POA: Insufficient documentation

## 2015-01-25 DIAGNOSIS — D500 Iron deficiency anemia secondary to blood loss (chronic): Secondary | ICD-10-CM | POA: Insufficient documentation

## 2015-01-25 DIAGNOSIS — N921 Excessive and frequent menstruation with irregular cycle: Secondary | ICD-10-CM

## 2015-01-25 DIAGNOSIS — H5442 Blindness, left eye, normal vision right eye: Secondary | ICD-10-CM | POA: Insufficient documentation

## 2015-01-25 DIAGNOSIS — Z7951 Long term (current) use of inhaled steroids: Secondary | ICD-10-CM | POA: Insufficient documentation

## 2015-01-25 DIAGNOSIS — Z6841 Body Mass Index (BMI) 40.0 and over, adult: Secondary | ICD-10-CM | POA: Insufficient documentation

## 2015-01-25 DIAGNOSIS — N920 Excessive and frequent menstruation with regular cycle: Secondary | ICD-10-CM | POA: Insufficient documentation

## 2015-01-25 DIAGNOSIS — J449 Chronic obstructive pulmonary disease, unspecified: Secondary | ICD-10-CM | POA: Insufficient documentation

## 2015-01-25 DIAGNOSIS — J45901 Unspecified asthma with (acute) exacerbation: Secondary | ICD-10-CM

## 2015-01-25 DIAGNOSIS — Z79818 Long term (current) use of other agents affecting estrogen receptors and estrogen levels: Secondary | ICD-10-CM | POA: Insufficient documentation

## 2015-01-25 DIAGNOSIS — Z87891 Personal history of nicotine dependence: Secondary | ICD-10-CM | POA: Insufficient documentation

## 2015-01-25 DIAGNOSIS — D509 Iron deficiency anemia, unspecified: Secondary | ICD-10-CM

## 2015-01-25 MED ORDER — BLOOD GLUCOSE MONITOR KIT: PACK | Status: AC

## 2015-01-25 MED ORDER — METFORMIN HCL 500 MG PO TABS: 500.0000 mg | ORAL_TABLET | Freq: Two times a day (BID) | ORAL | Status: DC

## 2015-01-25 MED ORDER — IPRATROPIUM-ALBUTEROL 0.5-2.5 (3) MG/3ML IN SOLN
3.0000 mL | Freq: Once | RESPIRATORY_TRACT | Status: AC
  Administered 2015-01-25: 3 mL via RESPIRATORY_TRACT

## 2015-01-25 MED ORDER — CEFTRIAXONE SODIUM 1 G IJ SOLR
500.0000 mg | Freq: Once | INTRAMUSCULAR | Status: AC
  Administered 2015-01-25: 500 mg via INTRAMUSCULAR

## 2015-01-25 NOTE — Patient Instructions (Signed)
Diabetes Mellitus and Food It is important for you to manage your blood sugar (glucose) level. Your blood glucose level can be greatly affected by what you eat. Eating healthier foods in the appropriate amounts throughout the day at about the same time each day will help you control your blood glucose level. It can also help slow or prevent worsening of your diabetes mellitus. Healthy eating may even help you improve the level of your blood pressure and reach or maintain a healthy weight.  HOW CAN FOOD AFFECT ME? Carbohydrates Carbohydrates affect your blood glucose level more than any other type of food. Your dietitian will help you determine how many carbohydrates to eat at each meal and teach you how to count carbohydrates. Counting carbohydrates is important to keep your blood glucose at a healthy level, especially if you are using insulin or taking certain medicines for diabetes mellitus. Alcohol Alcohol can cause sudden decreases in blood glucose (hypoglycemia), especially if you use insulin or take certain medicines for diabetes mellitus. Hypoglycemia can be a life-threatening condition. Symptoms of hypoglycemia (sleepiness, dizziness, and disorientation) are similar to symptoms of having too much alcohol.  If your health care provider has given you approval to drink alcohol, do so in moderation and use the following guidelines:  Women should not have more than one drink per day, and men should not have more than two drinks per day. One drink is equal to:  12 oz of beer.  5 oz of wine.  1 oz of hard liquor.  Do not drink on an empty stomach.  Keep yourself hydrated. Have water, diet soda, or unsweetened iced tea.  Regular soda, juice, and other mixers might contain a lot of carbohydrates and should be counted. WHAT FOODS ARE NOT RECOMMENDED? As you make food choices, it is important to remember that all foods are not the same. Some foods have fewer nutrients per serving than other  foods, even though they might have the same number of calories or carbohydrates. It is difficult to get your body what it needs when you eat foods with fewer nutrients. Examples of foods that you should avoid that are high in calories and carbohydrates but low in nutrients include:  Trans fats (most processed foods list trans fats on the Nutrition Facts label).  Regular soda.  Juice.  Candy.  Sweets, such as cake, pie, doughnuts, and cookies.  Fried foods. WHAT FOODS CAN I EAT? Have nutrient-rich foods, which will nourish your body and keep you healthy. The food you should eat also will depend on several factors, including:  The calories you need.  The medicines you take.  Your weight.  Your blood glucose level.  Your blood pressure level.  Your cholesterol level. You also should eat a variety of foods, including:  Protein, such as meat, poultry, fish, tofu, nuts, and seeds (lean animal proteins are best).  Fruits.  Vegetables.  Dairy products, such as milk, cheese, and yogurt (low fat is best).  Breads, grains, pasta, cereal, rice, and beans.  Fats such as olive oil, trans fat-free margarine, canola oil, avocado, and olives. DOES EVERYONE WITH DIABETES MELLITUS HAVE THE SAME MEAL PLAN? Because every person with diabetes mellitus is different, there is not one meal plan that works for everyone. It is very important that you meet with a dietitian who will help you create a meal plan that is just right for you. Document Released: 06/21/2005 Document Revised: 09/29/2013 Document Reviewed: 08/21/2013 Exit[employer] Patient Information 2015 [employer]. This   information is not intended to replace advice given to you by your health care provider. Make sure you discuss any questions you have with your health care provider.  

## 2015-01-25 NOTE — Progress Notes (Signed)
Patient went to [facility] hospital several weeks ago for heavy menstrual bleeding and feeling weak. Hospitalized at [facility] for tightness of chest and breathing problems. Patient still gets short of breath with activity but feels better.

## 2015-01-25 NOTE — Progress Notes (Signed)
Subjective:    Patient ID: Hannah Baldwin, female    DOB: April 07, 1971, 44 y.o.   MRN: [id number]  HPI  Hannah Baldwin presents today to establish care after hospitalization at [facility] from 01/20/15-01/21/15. She had presented with shortness of breath and chest tightness and was admitted for asthma exacerbation. Her oxygen saturation was 100% in the ED but she was tachycardic to 120s, respiratory rate was 20-25. WBC was 10.7. Chest x-ray revealed cardiomegaly with no acute underlying disease. She was placed on nebulizer treatment, prednisone, doxycycline. She was also started on Dulera due to potential for COPD given her history of smoking. She was also noticed to be hyperglycemic and was on sliding scale insulin and in A1c sent off came back at 7.2. Her labs also revealed significant anemia with a hemoglobin of 7 and she had endorsed a history of menorrhagia followed by GYN at the [facility].  Her condition stabilized and she was discharged.   Interval History: Complains of cough which is worse at night and some hoarseness and is using her MDI every 1-2 hours.Has also been wheezing; she is yet to pick up her prescriptions from the pharmacy.  Past Medical History  Diagnosis Date  . Asthma   . Pneumonia ~ 2000 X 1  . Anemia   . Legally blind in left eye, as defined in [place]     Past Surgical History  Procedure Laterality Date  . Cesarean section  1992    History   Social History  . Marital Status: Widowed    Spouse Name: N/A  . Number of Children: N/A  . Years of Education: N/A   Occupational History  . Not on file.   Social History Main Topics  . Smoking status: Former Smoker -- 0.25 packs/day for 20 years    Types: Cigarettes    Quit date: 01/18/2015  . Smokeless tobacco: Never Used  . Alcohol Use: Yes     Comment: 01/20/2015 "dank some alcohol on my birthday, cookouts,  etc,;  maybe once/month"  . Drug Use: No  . Sexual Activity: Not Currently   Other Topics  Concern  . Not on file   Social History Narrative    No Known Allergies  Current Outpatient Prescriptions on File Prior to Visit  Medication Sig Dispense Refill  . albuterol (PROVENTIL) (2.5 MG/3ML) 0.083% nebulizer solution Take 2.5 mg by nebulization every 6 (six) hours as needed for wheezing or shortness of breath.    Marland Kitchen albuterol (PROVENTIL) (2.5 MG/3ML) 0.083% nebulizer solution Take 3 mLs (2.5 mg total) by nebulization every 2 (two) hours as needed for wheezing. 75 mL 12  . diphenhydramine-acetaminophen (TYLENOL PM) 25-500 MG TABS Take 2 tablets by mouth at bedtime as needed (sleep).    . ferrous sulfate 325 (65 FE) MG tablet Take 1 tablet (325 mg total) by mouth daily. 30 tablet 0  . ibuprofen (ADVIL,MOTRIN) 200 MG tablet Take 400 mg by mouth every 6 (six) hours as needed for headache.    . megestrol (MEGACE) 40 MG tablet Take 1 tab (40 mg) TID x 3 days, then take 1 tab (40 mg) BID x 3 days then take 1 tab (40 mg) daily until follow-up visit 60 tablet 0  . mometasone-formoterol (DULERA) 100-5 MCG/ACT AERO Inhale 2 puffs into the lungs 2 (two) times daily. 1 Inhaler 0  . nicotine (NICODERM CQ - DOSED IN MG/24 HOURS) 14 mg/24hr patch Place 1 patch (14 mg total) onto the skin daily. 28 patch  0  . predniSONE (DELTASONE) 20 MG tablet Take 3 tablets (60 mg total) by mouth daily with breakfast. 3 tablet 0  . albuterol (PROVENTIL HFA;VENTOLIN HFA) 108 (90 BASE) MCG/ACT inhaler Inhale 2 puffs into the lungs every 6 (six) hours as needed for wheezing or shortness of breath.    . guaiFENesin-dextromethorphan (ROBITUSSIN DM) 100-10 MG/5ML syrup Take 5 mLs by mouth every 4 (four) hours as needed for cough. (Patient not taking: Reported on 01/03/2015) 118 mL 0  . omeprazole (PRILOSEC OTC) 20 MG tablet Take 1 tablet (20 mg total) by mouth daily. (Patient not taking: Reported on 01/25/2015) 30 tablet 1  . OVER THE COUNTER MEDICATION Place 1 drop into both nostrils daily as needed. OTC nasal allergy drops      No current facility-administered medications on file prior to visit.     Review of Systems  Constitutional: Negative for activity change, appetite change and fatigue.  HENT: Negative for congestion, sinus pressure and sore throat.   Eyes: Negative for visual disturbance.  Respiratory:       See HPI  Cardiovascular: Negative for chest pain and palpitations.  Gastrointestinal: Negative for abdominal pain, constipation and abdominal distention.  Endocrine: Negative for polydipsia.  Genitourinary: Negative for dysuria and frequency.  Musculoskeletal: Negative for back pain and arthralgias.  Skin: Negative for rash.  Neurological: Negative for tremors, light-headedness and numbness.  Hematological: Does not bruise/bleed easily.  Psychiatric/Behavioral: Negative for behavioral problems and agitation.         Objective: Filed Vitals:   01/25/15 1210  BP: 123/82  Pulse: 88  Temp: 98.2 F (36.8 C)  Resp: 20    Pulse oximetry on room air is 92%.    Physical Exam  Constitutional: She is oriented to person, place, and time. She appears well-developed and well-nourished. No distress.  HENT:  Head: Normocephalic.  Right Ear: External ear normal.  Left Ear: External ear normal.  Nose: Nose normal.  Mouth/Throat: Oropharynx is clear and moist.  Eyes: Conjunctivae and EOM are normal. Pupils are equal, round, and reactive to light.  Neck: Normal range of motion. No JVD present.  Cardiovascular: Normal rate, regular rhythm, normal heart sounds and intact distal pulses.  Exam reveals no gallop.   No murmur heard. Pulmonary/Chest: Effort normal. No respiratory distress. She has wheezes. She has rales. She exhibits no tenderness.  Abdominal: Soft. Bowel sounds are normal. She exhibits no distension and no mass. There is no tenderness.  Musculoskeletal: Normal range of motion. She exhibits no edema or tenderness.  Neurological: She is alert and oriented to person, place, and time. She  has normal reflexes.  Skin: Skin is warm and dry. She is not diaphoretic.  Psychiatric: She has a normal mood and affect.   Lab Results  Component Value Date   HGBA1C 7.2* 01/20/2015           Assessment & Plan:  44 year old female patient with recent hospitalization for asthma exacerbation and diagnosed with COPD also with significant anemia secondary to menometrorrhagia currently managed by GYN and also a recent diagnosis of type 2 diabetes mellitus.  Asthma exacerbation: Uncontrolled evidenced by increasing need for bronchodilators as well as presence of rhonchi on physical exam I am Rocephin and nebulizer treatments given in the clinic. She is still on the prednisone taper. Advised to pickup prescriptions from the pharmacy as she is yet to commence her inhaled corticosteroids.  COPD: New diagnosis. She quit smoking a week ago and has been commended. Will need  PFT once acute episode resolves.  Type 2 diabetes mellitus: A1c of 7.2. Commenced metformin and educated on lifestyle modifications. Educated on blood sugar goals of 80-120 fasting and less than 200 mg/DL random. Blood sugar log will be evaluated at her next office visit.  Menorrhagia/ anemia: Discharge Hemoglobin was 7. She is currently in first sulfate and is managed by GYN with an upcoming appointment on 02/03/15. Advised that if she is symptomatic or dizzy she would have to present to the ED.  Obesity: Advised to cut portion sizes and increase physical activity.

## 2015-02-01 ENCOUNTER — Ambulatory Visit (HOSPITAL_COMMUNITY)
Admission: RE | Admit: 2015-02-01 | Discharge: 2015-02-01 | Disposition: A | Discharge status: Home / Self Care | Payer: Self-pay | Source: Ambulatory Visit | Attending: Medical | Admitting: Medical

## 2015-02-01 DIAGNOSIS — N921 Excessive and frequent menstruation with irregular cycle: Secondary | ICD-10-CM | POA: Insufficient documentation

## 2015-02-01 DIAGNOSIS — D649 Anemia, unspecified: Secondary | ICD-10-CM | POA: Insufficient documentation

## 2015-02-02 ENCOUNTER — Ambulatory Visit: Payer: Self-pay | Attending: Internal Medicine | Admitting: Internal Medicine

## 2015-02-02 ENCOUNTER — Encounter: Payer: Self-pay | Admitting: Internal Medicine

## 2015-02-02 VITALS — BP 131/72 | HR 85 | Temp 97.9°F | Resp 15 | Wt 318.2 lb

## 2015-02-02 DIAGNOSIS — N921 Excessive and frequent menstruation with irregular cycle: Secondary | ICD-10-CM

## 2015-02-02 DIAGNOSIS — F17200 Nicotine dependence, unspecified, uncomplicated: Secondary | ICD-10-CM

## 2015-02-02 DIAGNOSIS — D509 Iron deficiency anemia, unspecified: Secondary | ICD-10-CM

## 2015-02-02 DIAGNOSIS — J449 Chronic obstructive pulmonary disease, unspecified: Secondary | ICD-10-CM

## 2015-02-02 DIAGNOSIS — Z720 Tobacco use: Secondary | ICD-10-CM | POA: Insufficient documentation

## 2015-02-02 DIAGNOSIS — E139 Other specified diabetes mellitus without complications: Secondary | ICD-10-CM

## 2015-02-02 DIAGNOSIS — IMO0001 Reserved for inherently not codable concepts without codable children: Secondary | ICD-10-CM

## 2015-02-02 DIAGNOSIS — Z8709 Personal history of other diseases of the respiratory system: Secondary | ICD-10-CM

## 2015-02-02 LAB — GLUCOSE, POCT (MANUAL RESULT ENTRY): POC Glucose: 128 mg/dl — AB (ref 70–99)

## 2015-02-02 MED ORDER — NICOTINE 14 MG/24HR TD PT24: 14.0000 mg | MEDICATED_PATCH | Freq: Every day | TRANSDERMAL | Status: DC

## 2015-02-02 MED ORDER — MOMETASONE FURO-FORMOTEROL FUM 100-5 MCG/ACT IN AERO: 2.0000 | INHALATION_SPRAY | Freq: Two times a day (BID) | RESPIRATORY_TRACT | Status: DC

## 2015-02-02 MED ORDER — ALBUTEROL SULFATE HFA 108 (90 BASE) MCG/ACT IN AERS: 2.0000 | INHALATION_SPRAY | Freq: Four times a day (QID) | RESPIRATORY_TRACT | Status: DC | PRN

## 2015-02-02 NOTE — Progress Notes (Signed)
Patient here to establish care Patient has history of asthma and diabetes Currently follows  [facility]  Had US done yesterday and will get the results at [facility] appointment

## 2015-02-02 NOTE — Patient Instructions (Signed)
Diabetes Mellitus and Food It is important for you to manage your blood sugar (glucose) level. Your blood glucose level can be greatly affected by what you eat. Eating healthier foods in the appropriate amounts throughout the day at about the same time each day will help you control your blood glucose level. It can also help slow or prevent worsening of your diabetes mellitus. Healthy eating may even help you improve the level of your blood pressure and reach or maintain a healthy weight.  HOW CAN FOOD AFFECT ME? Carbohydrates Carbohydrates affect your blood glucose level more than any other type of food. Your dietitian will help you determine how many carbohydrates to eat at each meal and teach you how to count carbohydrates. Counting carbohydrates is important to keep your blood glucose at a healthy level, especially if you are using insulin or taking certain medicines for diabetes mellitus. Alcohol Alcohol can cause sudden decreases in blood glucose (hypoglycemia), especially if you use insulin or take certain medicines for diabetes mellitus. Hypoglycemia can be a life-threatening condition. Symptoms of hypoglycemia (sleepiness, dizziness, and disorientation) are similar to symptoms of having too much alcohol.  If your health care provider has given you approval to drink alcohol, do so in moderation and use the following guidelines:  Women should not have more than one drink per day, and men should not have more than two drinks per day. One drink is equal to:  12 oz of beer.  5 oz of wine.  1 oz of hard liquor.  Do not drink on an empty stomach.  Keep yourself hydrated. Have water, diet soda, or unsweetened iced tea.  Regular soda, juice, and other mixers might contain a lot of carbohydrates and should be counted. WHAT FOODS ARE NOT RECOMMENDED? As you make food choices, it is important to remember that all foods are not the same. Some foods have fewer nutrients per serving than other  foods, even though they might have the same number of calories or carbohydrates. It is difficult to get your body what it needs when you eat foods with fewer nutrients. Examples of foods that you should avoid that are high in calories and carbohydrates but low in nutrients include:  Trans fats (most processed foods list trans fats on the Nutrition Facts label).  Regular soda.  Juice.  Candy.  Sweets, such as cake, pie, doughnuts, and cookies.  Fried foods. WHAT FOODS CAN I EAT? Have nutrient-rich foods, which will nourish your body and keep you healthy. The food you should eat also will depend on several factors, including:  The calories you need.  The medicines you take.  Your weight.  Your blood glucose level.  Your blood pressure level.  Your cholesterol level. You also should eat a variety of foods, including:  Protein, such as meat, poultry, fish, tofu, nuts, and seeds (lean animal proteins are best).  Fruits.  Vegetables.  Dairy products, such as milk, cheese, and yogurt (low fat is best).  Breads, grains, pasta, cereal, rice, and beans.  Fats such as olive oil, trans fat-free margarine, canola oil, avocado, and olives. DOES EVERYONE WITH DIABETES MELLITUS HAVE THE SAME MEAL PLAN? Because every person with diabetes mellitus is different, there is not one meal plan that works for everyone. It is very important that you meet with a dietitian who will help you create a meal plan that is just right for you. Document Released: 06/21/2005 Document Revised: 09/29/2013 Document Reviewed: 08/21/2013 Exit[employer] Patient Information 2015 [employer]. This   information is not intended to replace advice given to you by your health care provider. Make sure you discuss any questions you have with your health care provider.  

## 2015-02-02 NOTE — Progress Notes (Signed)
MRN: [id number] Name: Hannah Baldwin  Sex: female Age: 44 y.o. DOB: 19-Nov-1970  Allergies: Review of patient's allergies indicates no known allergies.  Chief Complaint  Patient presents with  . new patient    HPI: Patient is 44 y.o. female who was recently hospitalized her with symptoms of shortness of breath and was diagnosed with asthma/COPD exacerbation, she was also found to have anemia likely secondary to menorrhagia and patient has been taking iron supplements and has been following up with GYN, she followed up in the transitional care clinic with Dr. Jarold Song and was started on metformin her last hemoglobin A1c was 7.2%, as per patient she is taking her medication, she brought the fingerstick log and her fasting sugar is usually between 80 to 130 milligram per deciliter, occasionally her blood sugar drops less than 70, she's educated about hypoglycemic symptoms. Today patient is requesting refill on inhalers, still has occasional wheezing denies any fever chills chest pain or shortness of breath.today patient is requesting prescription for nicotine patch.  Past Medical History  Diagnosis Date  . Asthma   . Pneumonia ~ 2000 X 1  . Anemia   . Legally blind in left eye, as defined in [place]   . Diabetes mellitus without complication     Past Surgical History  Procedure Laterality Date  . Cesarean section  1992      Medication List       This list is accurate as of: 02/02/15 11:28 AM.  Always use your most recent med list.               albuterol (2.5 MG/3ML) 0.083% nebulizer solution  Commonly known as:  PROVENTIL  Take 2.5 mg by nebulization every 6 (six) hours as needed for wheezing or shortness of breath.     albuterol (2.5 MG/3ML) 0.083% nebulizer solution  Commonly known as:  PROVENTIL  Take 3 mLs (2.5 mg total) by nebulization every 2 (two) hours as needed for wheezing.     albuterol 108 (90 BASE) MCG/ACT inhaler  Commonly known as:  PROVENTIL HFA;VENTOLIN HFA    Inhale 2 puffs into the lungs every 6 (six) hours as needed for wheezing or shortness of breath.     blood glucose meter kit and supplies Kit  Dispense based on patient and insurance preference. Use up to four times daily as directed. (FOR ICD-9 250.00, 250.01).     diphenhydramine-acetaminophen 25-500 MG Tabs  Commonly known as:  TYLENOL PM  Take 2 tablets by mouth at bedtime as needed (sleep).     ferrous sulfate 325 (65 FE) MG tablet  Take 1 tablet (325 mg total) by mouth daily.     guaiFENesin-dextromethorphan 100-10 MG/5ML syrup  Commonly known as:  ROBITUSSIN DM  Take 5 mLs by mouth every 4 (four) hours as needed for cough.     ibuprofen 200 MG tablet  Commonly known as:  ADVIL,MOTRIN  Take 400 mg by mouth every 6 (six) hours as needed for headache.     megestrol 40 MG tablet  Commonly known as:  MEGACE  Take 1 tab (40 mg) TID x 3 days, then take 1 tab (40 mg) BID x 3 days then take 1 tab (40 mg) daily until follow-up visit     metFORMIN 500 MG tablet  Commonly known as:  GLUCOPHAGE  Take 1 tablet (500 mg total) by mouth 2 (two) times daily with a meal.     mometasone-formoterol 100-5 MCG/ACT Aero  Commonly known  as:  DULERA  Inhale 2 puffs into the lungs 2 (two) times daily.     nicotine 14 mg/24hr patch  Commonly known as:  NICODERM CQ - dosed in mg/24 hours  Place 1 patch (14 mg total) onto the skin daily.     omeprazole 20 MG tablet  Commonly known as:  PRILOSEC OTC  Take 1 tablet (20 mg total) by mouth daily.     OVER THE COUNTER MEDICATION  Place 1 drop into both nostrils daily as needed. OTC nasal allergy drops     predniSONE 20 MG tablet  Commonly known as:  DELTASONE  Take 3 tablets (60 mg total) by mouth daily with breakfast.        Meds ordered this encounter  Medications  . albuterol (PROVENTIL HFA;VENTOLIN HFA) 108 (90 BASE) MCG/ACT inhaler    Sig: Inhale 2 puffs into the lungs every 6 (six) hours as needed for wheezing or shortness of breath.     Dispense:  18 g    Refill:  3  . nicotine (NICODERM CQ - DOSED IN MG/24 HOURS) 14 mg/24hr patch    Sig: Place 1 patch (14 mg total) onto the skin daily.    Dispense:  28 patch    Refill:  0  . mometasone-formoterol (DULERA) 100-5 MCG/ACT AERO    Sig: Inhale 2 puffs into the lungs 2 (two) times daily.    Dispense:  1 Inhaler    Refill:  3     There is no immunization history on file for this patient.  Family History  Problem Relation Age of Onset  . Asthma Mother   . Asthma Daughter     History  Substance Use Topics  . Smoking status: Former Smoker -- 0.25 packs/day for 20 years    Types: Cigarettes    Quit date: 01/18/2015  . Smokeless tobacco: Never Used  . Alcohol Use: Yes     Comment: 01/20/2015 "dank some alcohol on my birthday, cookouts,  etc,;  maybe once/month"    Review of Systems   As noted in HPI  Filed Vitals:   02/02/15 1050  BP: 131/72  Pulse: 85  Temp: 97.9 F (36.6 C)  Resp: 15    Physical Exam  Physical Exam  Constitutional:  Obese female sitting comfortably not in acute distress  Cardiovascular: Normal rate and regular rhythm.   Pulmonary/Chest: No respiratory distress. She has no rales.  Minimal wheezing  Musculoskeletal: She exhibits no edema.    CBC    Component Value Date/Time   WBC 12.3* 01/21/2015 0530   RBC 3.60* 01/21/2015 0530   HGB 7.0* 01/21/2015 0530   HCT 25.0* 01/21/2015 0530   PLT 742* 01/21/2015 0530   MCV 69.4* 01/21/2015 0530   LYMPHSABS 0.6* 01/20/2015 1557   MONOABS 0.2 01/20/2015 1557   EOSABS 0.0 01/20/2015 1557   BASOSABS 0.0 01/20/2015 1557    CMP     Component Value Date/Time   NA 137 01/21/2015 0530   K 3.7 01/21/2015 0530   CL 104 01/21/2015 0530   CO2 22 01/21/2015 0530   GLUCOSE 327* 01/21/2015 0530   BUN 11 01/21/2015 0530   CREATININE 1.13* 01/21/2015 0530   CALCIUM 9.1 01/21/2015 0530   GFRNONAA 59* 01/21/2015 0530   GFRAA 68* 01/21/2015 0530    Lab Results  Component Value  Date/Time   CHOL 127 01/20/2015 10:41 PM    Lab Results  Component Value Date/Time   HGBA1C 7.2* 01/20/2015 10:41 PM  No results found for: AST  Assessment and Plan  Other specified diabetes mellitus without complications - Plan:  Results for orders placed or performed in visit on 02/02/15  Glucose (CBG)  Result Value Ref Range   POC Glucose 128 (A) 70 - 99 mg/dl   Recent hemoglobin A1c was 7.2%, continue with metformin, will repeat A1c in 3 months Ambulatory referral to Ophthalmology  Menometrorrhagia Currently patient following up with her GYN  Hypochromic microcytic anemia Continue with iron supplement  History of asthma - Plan: albuterol (PROVENTIL HFA;VENTOLIN HFA) 108 (90 BASE) MCG/ACT inhaler, mometasone-formoterol (DULERA) 100-5 MCG/ACT AERO  Smoking - Plan: nicotine (NICODERM CQ - DOSED IN MG/24 HOURS) 14 mg/24hr patch  Severe obesity (BMI >= 40) Advised patient for diet and exercise  Chronic obstructive pulmonary disease, unspecified COPD, unspecified chronic bronchitis type - Plan: albuterol (PROVENTIL HFA;VENTOLIN HFA) 108 (90 BASE) MCG/ACT inhaler, mometasone-formoterol (DULERA) 100-5 MCG/ACT AERO    Return in about 3 months (around 05/04/2015), or if symptoms worsen or fail to improve, for diabetes.   This note has been created with Surveyor, quantity. Any transcriptional errors are unintentional.    Lorayne Marek, MD

## 2015-02-03 ENCOUNTER — Ambulatory Visit (INDEPENDENT_AMBULATORY_CARE_PROVIDER_SITE_OTHER): Payer: Self-pay | Admitting: Obstetrics & Gynecology

## 2015-02-03 ENCOUNTER — Encounter: Payer: Self-pay | Admitting: Obstetrics & Gynecology

## 2015-02-03 VITALS — BP 120/63 | HR 80 | Ht 63.0 in | Wt 316.6 lb

## 2015-02-03 DIAGNOSIS — N924 Excessive bleeding in the premenopausal period: Secondary | ICD-10-CM

## 2015-02-03 DIAGNOSIS — Z3202 Encounter for pregnancy test, result negative: Secondary | ICD-10-CM

## 2015-02-03 DIAGNOSIS — E119 Type 2 diabetes mellitus without complications: Secondary | ICD-10-CM

## 2015-02-03 DIAGNOSIS — E669 Obesity, unspecified: Secondary | ICD-10-CM

## 2015-02-03 DIAGNOSIS — J45909 Unspecified asthma, uncomplicated: Secondary | ICD-10-CM

## 2015-02-03 DIAGNOSIS — N938 Other specified abnormal uterine and vaginal bleeding: Secondary | ICD-10-CM

## 2015-02-03 LAB — POCT PREGNANCY, URINE: Preg Test, Ur: NEGATIVE

## 2015-02-03 MED ORDER — MEGESTROL ACETATE 40 MG PO TABS: 40.0000 mg | ORAL_TABLET | Freq: Every day | ORAL | Status: DC

## 2015-02-03 NOTE — Patient Instructions (Signed)
Levonorgestrel intrauterine device (IUD) What is this medicine? LEVONORGESTREL IUD (LEE voe nor jes trel) is a contraceptive (birth control) device. The device is placed inside the uterus by a healthcare professional. It is used to prevent pregnancy and can also be used to treat heavy bleeding that occurs during your period. Depending on the device, it can be used for 3 to 5 years. This medicine may be used for other purposes; ask your health care provider or pharmacist if you have questions. COMMON BRAND NAME(S): LILETTA, Mirena, Skyla What should I tell my health care provider before I take this medicine? They need to know if you have any of these conditions: -abnormal Pap smear -cancer of the breast, uterus, or cervix -diabetes -endometritis -genital or pelvic infection now or in the past -have more than one sexual partner or your partner has more than one partner -heart disease -history of an ectopic or tubal pregnancy -immune system problems -IUD in place -liver disease or tumor -problems with blood clots or take blood-thinners -use intravenous drugs -uterus of unusual shape -vaginal bleeding that has not been explained -an unusual or allergic reaction to levonorgestrel, other hormones, silicone, or polyethylene, medicines, foods, dyes, or preservatives -pregnant or trying to get pregnant -breast-feeding How should I use this medicine? This device is placed inside the uterus by a health care professional. Talk to your pediatrician regarding the use of this medicine in children. Special care may be needed. Overdosage: If you think you have taken too much of this medicine contact a poison control center or emergency room at once. NOTE: This medicine is only for you. Do not share this medicine with others. What if I miss a dose? This does not apply. What may interact with this medicine? Do not take this medicine with any of the following  medications: -amprenavir -bosentan -fosamprenavir This medicine may also interact with the following medications: -aprepitant -barbiturate medicines for inducing sleep or treating seizures -bexarotene -griseofulvin -medicines to treat seizures like carbamazepine, ethotoin, felbamate, oxcarbazepine, phenytoin, topiramate -modafinil -pioglitazone -rifabutin -rifampin -rifapentine -some medicines to treat HIV infection like atazanavir, indinavir, lopinavir, nelfinavir, tipranavir, ritonavir -St. John's wort -warfarin This list may not describe all possible interactions. Give your health care provider a list of all the medicines, herbs, non-prescription drugs, or dietary supplements you use. Also tell them if you smoke, drink alcohol, or use illegal drugs. Some items may interact with your medicine. What should I watch for while using this medicine? Visit your doctor or health care professional for regular check ups. See your doctor if you or your partner has sexual contact with others, becomes HIV positive, or gets a sexual transmitted disease. This product does not protect you against HIV infection (AIDS) or other sexually transmitted diseases. You can check the placement of the IUD yourself by reaching up to the top of your vagina with clean fingers to feel the threads. Do not pull on the threads. It is a good habit to check placement after each menstrual period. Call your doctor right away if you feel more of the IUD than just the threads or if you cannot feel the threads at all. The IUD may come out by itself. You may become pregnant if the device comes out. If you notice that the IUD has come out use a backup birth control method like condoms and call your health care provider. Using tampons will not change the position of the IUD and are okay to use during your period. What side effects may   I notice from receiving this medicine? Side effects that you should report to your doctor or  health care professional as soon as possible: -allergic reactions like skin rash, itching or hives, swelling of the face, lips, or tongue -fever, flu-like symptoms -genital sores -high blood pressure -no menstrual period for 6 weeks during use -pain, swelling, warmth in the leg -pelvic pain or tenderness -severe or sudden headache -signs of pregnancy -stomach cramping -sudden shortness of breath -trouble with balance, talking, or walking -unusual vaginal bleeding, discharge -yellowing of the eyes or skin Side effects that usually do not require medical attention (report to your doctor or health care professional if they continue or are bothersome): -acne -breast pain -change in sex drive or performance -changes in weight -cramping, dizziness, or faintness while the device is being inserted -headache -irregular menstrual bleeding within first 3 to 6 months of use -nausea This list may not describe all possible side effects. Call your doctor for medical advice about side effects. You may report side effects to FDA at 1-800-FDA-1088. Where should I keep my medicine? This does not apply. NOTE: This sheet is a summary. It may not cover all possible information. If you have questions about this medicine, talk to your doctor, pharmacist, or health care provider.  2015, Elsevier/Gold Standard. (2011-10-25 13:54:04) Dysfunctional Uterine Bleeding Normally, menstrual periods begin between ages 9 to 74 in young women. A normal menstrual cycle/period may begin every 23 days up to 35 days and lasts from 1 to 7 days. Around 12 to 14 days before your menstrual period starts, ovulation (ovary produces an egg) occurs. When counting the time between menstrual periods, count from the first day of bleeding of the previous period to the first day of bleeding of the next period. Dysfunctional (abnormal) uterine bleeding is bleeding that is different from a normal menstrual period. Your periods may come  earlier or later than usual. They may be lighter, have blood clots or be heavier. You may have bleeding between periods, or you may skip one period or more. You may have bleeding after sexual intercourse, bleeding after menopause, or no menstrual period. CAUSES   Pregnancy (normal, miscarriage, tubal).  IUDs (intrauterine device, birth control).  Birth control pills.  Hormone treatment.  Menopause.  Infection of the cervix.  Blood clotting problems.  Infection of the inside lining of the uterus.  Endometriosis, inside lining of the uterus growing in the pelvis and other female organs.  Adhesions (scar tissue) inside the uterus.  Obesity or severe weight loss.  Uterine polyps inside the uterus.  Cancer of the vagina, cervix, or uterus.  Ovarian cysts or polycystic ovary syndrome.  Medical problems (diabetes, thyroid disease).  Uterine fibroids (noncancerous tumor).  Problems with your female hormones.  Endometrial hyperplasia, very thick lining and enlarged cells inside of the uterus.  Medicines that interfere with ovulation.  Radiation to the pelvis or abdomen.  Chemotherapy. DIAGNOSIS   Your doctor will discuss the history of your menstrual periods, medicines you are taking, changes in your weight, stress in your life, and any medical problems you may have.  Your doctor will do a physical and pelvic examination.  Your doctor may want to perform certain tests to make a diagnosis, such as:  Pap test.  Blood tests.  Cultures for infection.  CT scan.  Ultrasound.  Hysteroscopy.  Laparoscopy.  MRI.  Hysterosalpingography.  D and C.  Endometrial biopsy. TREATMENT  Treatment will depend on the cause of the dysfunctional uterine bleeding (DUB).  Treatment may include:  Observing your menstrual periods for a couple of months.  Prescribing medicines for medical problems, including:  Antibiotics.  Hormones.  Birth control pills.  Removing an  IUD (intrauterine device, birth control).  Surgery:  D and C (scrape and remove tissue from inside the uterus).  Laparoscopy (examine inside the abdomen with a lighted tube).  Uterine ablation (destroy lining of the uterus with electrical current, laser, heat, or freezing).  Hysteroscopy (examine cervix and uterus with a lighted tube).  Hysterectomy (remove the uterus). HOME CARE INSTRUCTIONS   If medicines were prescribed, take exactly as directed. Do not change or switch medicines without consulting your caregiver.  Long term heavy bleeding may result in iron deficiency. Your caregiver may have prescribed iron pills. They help replace the iron that your body lost from heavy bleeding. Take exactly as directed.  Do not take aspirin or medicines that contain aspirin one week before or during your menstrual period. Aspirin may make the bleeding worse.  If you need to change your sanitary pad or tampon more than once every 2 hours, stay in bed with your feet elevated and a cold pack on your lower abdomen. Rest as much as possible, until the bleeding stops or slows down.  Eat well-balanced meals. Eat foods high in iron. Examples are:  Leafy green vegetables.  Whole-grain breads and cereals.  Eggs.  Meat.  Liver.  Do not try to lose weight until the abnormal bleeding has stopped and your blood iron level is back to normal. Do not lift more than ten pounds or do strenuous activities when you are bleeding.  For a couple of months, make note on your calendar, marking the start and ending of your period, and the type of bleeding (light, medium, heavy, spotting, clots or missed periods). This is for your caregiver to better evaluate your problem. SEEK MEDICAL CARE IF:   You develop nausea (feeling sick to your stomach) and vomiting, dizziness, or diarrhea while you are taking your medicine.  You are getting lightheaded or weak.  You have any problems that may be related to the  medicine you are taking.  You develop pain with your DUB.  You want to remove your IUD.  You want to stop or change your birth control pills or hormones.  You have any type of abnormal bleeding mentioned above.  You are over 15 years old and have not had a menstrual period yet.  You are 44 years old and you are still having menstrual periods.  You have any of the symptoms mentioned above.  You develop a rash. SEEK IMMEDIATE MEDICAL CARE IF:   An oral temperature above 102 F (38.9 C) develops.  You develop chills.  You are changing your sanitary pad or tampon more than once an hour.  You develop abdominal pain.  You pass out or faint. Document Released: 09/21/2000 Document Revised: 12/17/2011 Document Reviewed: 08/23/2009 [facility] Patient Information 2015 [place], [place]. This information is not intended to replace advice given to you by your health care provider. Make sure you discuss any questions you have with your health care provider.

## 2015-02-03 NOTE — Progress Notes (Deleted)
   Subjective:    Patient ID: Hannah Baldwin, female    DOB: 03-23-1971, 44 y.o.   MRN: [id number]  HPI    Review of Systems     Objective:   Physical Exam        Assessment & Plan:

## 2015-02-03 NOTE — Progress Notes (Signed)
Patient ID: Hannah Baldwin, female   DOB: 06/17/1971, 44 y.o.   MRN: [id number]  Chief Complaint  Patient presents with  . Follow-up    from [facility]  . Menorrhagia    HPI Hannah Baldwin is a 44 y.o. female.  [id number] Patient's last menstrual period was 01/16/2015. H/O irregular menses and sometimes prolonged heavy bleeding who presented to [facility]/28 with several weeks of bleeding and asthma exacerbation no bleeding now on megace.  HPI  Past Medical History  Diagnosis Date  . Asthma   . Pneumonia ~ 2000 X 1  . Anemia   . Legally blind in left eye, as defined in [place]   . Diabetes mellitus without complication     Past Surgical History  Procedure Laterality Date  . Cesarean section  1992    Family History  Problem Relation Age of Onset  . Asthma Mother   . Asthma Daughter     Social History History  Substance Use Topics  . Smoking status: Former Smoker -- 0.25 packs/day for 20 years    Types: Cigarettes    Quit date: 01/18/2015  . Smokeless tobacco: Never Used  . Alcohol Use: Yes     Comment: 01/20/2015 "dank some alcohol on my birthday, cookouts,  etc,;  maybe once/month"    No Known Allergies  Current Outpatient Prescriptions  Medication Sig Dispense Refill  . albuterol (PROVENTIL HFA;VENTOLIN HFA) 108 (90 BASE) MCG/ACT inhaler Inhale 2 puffs into the lungs every 6 (six) hours as needed for wheezing or shortness of breath. 18 g 3  . albuterol (PROVENTIL) (2.5 MG/3ML) 0.083% nebulizer solution Take 2.5 mg by nebulization every 6 (six) hours as needed for wheezing or shortness of breath.    . blood glucose meter kit and supplies KIT Dispense based on patient and insurance preference. Use up to four times daily as directed. (FOR ICD-9 250.00, 250.01). 1 each 0  . diphenhydramine-acetaminophen (TYLENOL PM) 25-500 MG TABS Take 2 tablets by mouth at bedtime as needed (sleep).    . ferrous sulfate 325 (65 FE) MG tablet Take 1 tablet (325 mg total) by mouth daily. 30 tablet 0  .  ibuprofen (ADVIL,MOTRIN) 200 MG tablet Take 400 mg by mouth every 6 (six) hours as needed for headache.    . megestrol (MEGACE) 40 MG tablet Take 1 tablet (40 mg total) by mouth daily. 30 tablet 6  . metFORMIN (GLUCOPHAGE) 500 MG tablet Take 1 tablet (500 mg total) by mouth 2 (two) times daily with a meal. 60 tablet 3  . mometasone-formoterol (DULERA) 100-5 MCG/ACT AERO Inhale 2 puffs into the lungs 2 (two) times daily. 1 Inhaler 3  . nicotine (NICODERM CQ - DOSED IN MG/24 HOURS) 14 mg/24hr patch Place 1 patch (14 mg total) onto the skin daily. 28 patch 0  . OVER THE COUNTER MEDICATION Place 1 drop into both nostrils daily as needed. OTC nasal allergy drops     No current facility-administered medications for this visit.    Review of Systems Review of Systems  Constitutional: Negative.   Respiratory: Positive for wheezing.   Genitourinary: Negative for vaginal bleeding, vaginal discharge and pelvic pain.    Blood pressure 120/63, pulse 80, height _0  (1.6 m), weight 316 lb 9.6 oz (143.609 kg), last menstrual period 01/16/2015.  Physical Exam Physical Exam  Constitutional: She is oriented to person, place, and time. She appears well-developed. No distress.  obese  Pulmonary/Chest: Effort normal.  Genitourinary: Uterus normal. No vaginal discharge found.  No mass  Neurological: She is alert and oriented to person, place, and time.  Psychiatric: She has a normal mood and affect. Her behavior is normal.    Data Reviewed  CLINICAL DATA: Menometrorragia, anemia  EXAM: TRANSABDOMINAL AND TRANSVAGINAL ULTRASOUND OF PELVIS  TECHNIQUE: Both transabdominal and transvaginal ultrasound examinations of the pelvis were performed. Transabdominal technique was performed for global imaging of the pelvis including uterus, ovaries, adnexal regions, and pelvic cul-de-sac. It was necessary to proceed with endovaginal exam following the transabdominal exam to better visualize the  endometrium.  COMPARISON: None  FINDINGS: Uterus  Measurements: 13.4 x 6.8 x 7.9 cm. 3.0 x 2.3 x 2.0 cm intramural left fundal fibroid. 1.2 x 0.8 x 0.9 cm subserosal anterior fundal fibroid.  Endometrium  Thickness: 17 mm. No focal abnormality visualized.  Right ovary  Measurements: 2.8 x 2.6 x 1.9 cm. Normal appearance/no adnexal mass.  Left ovary  Measurements: 2.6 x 1.9 x 2.1 cm. Normal appearance/no adnexal mass.  Other findings  No free fluid.  IMPRESSION: Endometrial complex measures 17 mm. If bleeding remains unresponsive to hormonal or medical therapy, focal lesion work-up with sonohysterogram should be considered. Endometrial biopsy should also be considered in pre-menopausal patients at high risk for endometrial carcinoma.  Two uterine fibroids measuring up to 3.0 cm.  Bilateral ovaries are within normal limits.  (Ref: Radiological Reasoning: Algorithmic Workup of Abnormal Vaginal Bleeding with Endovaginal Sonography and Sonohysterography. AJR 2008; [id number])   Electronically Signed  By: Julian Hy M.D.  On: 02/01/2015 15:21        Assessment    DUB perimenopause, h/o anemia, DM, obesity and asthma    Plan    Megace for now, recommend Mirena IUD application and return to clinic         [place] 02/03/2015, 3:02 PM

## 2015-02-04 ENCOUNTER — Other Ambulatory Visit: Payer: Self-pay | Admitting: Internal Medicine

## 2015-02-04 DIAGNOSIS — J449 Chronic obstructive pulmonary disease, unspecified: Secondary | ICD-10-CM

## 2015-02-04 DIAGNOSIS — Z8709 Personal history of other diseases of the respiratory system: Secondary | ICD-10-CM

## 2015-02-04 MED ORDER — ALBUTEROL SULFATE HFA 108 (90 BASE) MCG/ACT IN AERS
2.0000 | INHALATION_SPRAY | Freq: Four times a day (QID) | RESPIRATORY_TRACT | Status: DC | PRN
Start: 2015-02-04 — End: 2015-11-03

## 2015-02-15 ENCOUNTER — Telehealth: Payer: Self-pay | Admitting: *Deleted

## 2015-02-15 ENCOUNTER — Other Ambulatory Visit: Payer: Self-pay

## 2015-02-15 MED ORDER — FREESTYLE LANCETS MISC: Status: DC

## 2015-02-15 MED ORDER — GLUCOSE BLOOD VI STRP: ORAL_STRIP | Status: DC

## 2015-02-15 MED ORDER — TRUEPLUS LANCETS 28G MISC: Status: DC

## 2015-02-15 NOTE — Telephone Encounter (Signed)
Refilled lancets and test strips.

## 2015-02-18 ENCOUNTER — Telehealth: Payer: Self-pay | Admitting: Internal Medicine

## 2015-02-18 NOTE — Telephone Encounter (Signed)
Patient is requesting a refill for ferrous sulfate 325 (65 FE) MG tablet. Please follow up with patient.

## 2015-03-08 ENCOUNTER — Telehealth: Payer: Self-pay | Admitting: Internal Medicine

## 2015-03-08 NOTE — Telephone Encounter (Signed)
Pt is requesting a refill for megestrol (MEGACE) 40 MG tablet. Please follow up with pt.

## 2015-03-09 ENCOUNTER — Ambulatory Visit: Payer: Self-pay | Admitting: Obstetrics & Gynecology

## 2015-03-15 ENCOUNTER — Telehealth: Payer: Self-pay | Admitting: *Deleted

## 2015-03-15 DIAGNOSIS — N938 Other specified abnormal uterine and vaginal bleeding: Secondary | ICD-10-CM

## 2015-03-15 NOTE — Telephone Encounter (Signed)
Pt called and stated that she needs a refill on her Megace. Advised patient that Dr. Roselie Awkward had given her rx in April with 6 refills. Patient was unaware of this and stated that she will go to her pharmacy.

## 2015-03-16 ENCOUNTER — Ambulatory Visit: Payer: Self-pay | Admitting: Obstetrics & Gynecology

## 2015-03-16 MED ORDER — MEGESTROL ACETATE 40 MG PO TABS: 40.0000 mg | ORAL_TABLET | Freq: Every day | ORAL | Status: DC

## 2015-03-16 NOTE — Telephone Encounter (Signed)
Pt left new message stating that she does not have any refills @ [facility] where she originally got her medicine. She is feeling weak and bleeding heavy and really needs a refill. Also, she would like the Rx to go to [place]. I called pt and discussed her concern. I stated that she was given printed Rx on 02/03/15 which included refills. Pt feels that she took the Rx to [facility] but they are saying that refills need to be authorized. I then called [facility] [phone] and verified that they received Rx on 01/03/15 with no refills. That was from pt's visit to [facility]. Pt no longer has the printed Rx from 02/03/15. Per pt request, I sent Rx to her preferred pharmacy ([facility]) based on Dr. Jordan Hawks order from 02/03/15. Pt may pick up medication today.  Pt expressed gratitude and voiced understanding.

## 2015-03-16 NOTE — Addendum Note (Signed)
Addended by: Langston Reusing on: 03/16/2015 11:18 AM   Modules accepted: Orders

## 2015-03-19 ENCOUNTER — Encounter (HOSPITAL_COMMUNITY): Payer: Self-pay

## 2015-03-19 ENCOUNTER — Inpatient Hospital Stay (HOSPITAL_COMMUNITY)
Admission: AD | Admit: 2015-03-19 | Discharge: 2015-03-19 | Disposition: A | Discharge status: Home / Self Care | Payer: Self-pay | Source: Ambulatory Visit | Attending: Obstetrics and Gynecology | Admitting: Obstetrics and Gynecology

## 2015-03-19 DIAGNOSIS — D259 Leiomyoma of uterus, unspecified: Secondary | ICD-10-CM | POA: Insufficient documentation

## 2015-03-19 DIAGNOSIS — N939 Abnormal uterine and vaginal bleeding, unspecified: Secondary | ICD-10-CM | POA: Insufficient documentation

## 2015-03-19 DIAGNOSIS — F17210 Nicotine dependence, cigarettes, uncomplicated: Secondary | ICD-10-CM | POA: Insufficient documentation

## 2015-03-19 LAB — URINE MICROSCOPIC-ADD ON

## 2015-03-19 LAB — CBC WITH DIFFERENTIAL/PLATELET
Basophils Absolute: 0 10*3/uL (ref 0.0–0.1)
Basophils Relative: 0 % (ref 0–1)
EOS ABS: 0.4 10*3/uL (ref 0.0–0.7)
Eosinophils Relative: 4 % (ref 0–5)
HEMATOCRIT: 28.8 % — AB (ref 36.0–46.0)
Hemoglobin: 8.1 g/dL — ABNORMAL LOW (ref 12.0–15.0)
LYMPHS ABS: 2.8 10*3/uL (ref 0.7–4.0)
LYMPHS PCT: 27 % (ref 12–46)
MCH: 19.1 pg — ABNORMAL LOW (ref 26.0–34.0)
MCHC: 28.1 g/dL — AB (ref 30.0–36.0)
MCV: 67.9 fL — AB (ref 78.0–100.0)
MONO ABS: 0.5 10*3/uL (ref 0.1–1.0)
Monocytes Relative: 5 % (ref 3–12)
NEUTROS ABS: 6.7 10*3/uL (ref 1.7–7.7)
Neutrophils Relative %: 64 % (ref 43–77)
Platelets: 549 10*3/uL — ABNORMAL HIGH (ref 150–400)
RBC: 4.24 MIL/uL (ref 3.87–5.11)
RDW: 19.6 % — ABNORMAL HIGH (ref 11.5–15.5)
WBC: 10.4 10*3/uL (ref 4.0–10.5)

## 2015-03-19 LAB — URINALYSIS, ROUTINE W REFLEX MICROSCOPIC
Bilirubin Urine: NEGATIVE
Glucose, UA: NEGATIVE mg/dL
Ketones, ur: NEGATIVE mg/dL
LEUKOCYTES UA: NEGATIVE
Nitrite: NEGATIVE
Protein, ur: NEGATIVE mg/dL
Specific Gravity, Urine: 1.025 (ref 1.005–1.030)
Urobilinogen, UA: 0.2 mg/dL (ref 0.0–1.0)
pH: 6 (ref 5.0–8.0)

## 2015-03-19 LAB — POCT PREGNANCY, URINE: Preg Test, Ur: NEGATIVE

## 2015-03-19 MED ORDER — OXYCODONE-ACETAMINOPHEN 5-325 MG PO TABS
1.0000 | ORAL_TABLET | Freq: Four times a day (QID) | ORAL | Status: DC | PRN
Start: 2015-03-19 — End: 2015-03-19

## 2015-03-19 MED ORDER — HYDROMORPHONE HCL 1 MG/ML IJ SOLN
1.0000 mg | Freq: Once | INTRAMUSCULAR | Status: AC
  Administered 2015-03-19: 1 mg via INTRAMUSCULAR

## 2015-03-19 MED ORDER — OXYCODONE-ACETAMINOPHEN 5-325 MG PO TABS: 1.0000 | ORAL_TABLET | Freq: Four times a day (QID) | ORAL | Status: DC | PRN

## 2015-03-19 MED ORDER — HYDROMORPHONE HCL 1 MG/ML IJ SOLN
INTRAMUSCULAR | Status: AC
  Filled 2015-03-19: qty 1

## 2015-03-19 NOTE — Discharge Instructions (Signed)
Fibroids Fibroids are lumps (tumors) that can occur any place in a woman's body. These lumps are not cancerous. Fibroids vary in size, weight, and where they grow. HOME CARE  Do not take aspirin.  Write down the number of pads or tampons you use during your period. Tell your doctor. This can help determine the best treatment for you. GET HELP RIGHT AWAY IF:  You have pain in your lower belly (abdomen) that is not helped with medicine.  You have cramps that are not helped with medicine.  You have more bleeding between or during your period.  You feel lightheaded or pass out (faint).  Your lower belly pain gets worse. MAKE SURE YOU:  Understand these instructions.  Will watch your condition.  Will get help right away if you are not doing well or get worse. Document Released: 10/27/2010 Document Revised: 12/17/2011 Document Reviewed: 10/27/2010 [facility] Patient Information 2015 [place], [place]. This information is not intended to replace advice given to you by your health care provider. Make sure you discuss any questions you have with your health care provider. Abnormal Uterine Bleeding Abnormal uterine bleeding means bleeding from the vagina that is not your normal menstrual period. This can be:  Bleeding or spotting between periods.  Bleeding after sex (sexual intercourse).  Bleeding that is heavier or more than normal.  Periods that last longer than usual.  Bleeding after menopause. There are many problems that may cause this. Treatment will depend on the cause of the bleeding. Any kind of bleeding that is not normal should be reviewed by your doctor.  HOME CARE Watch your condition for any changes. These actions may lessen any discomfort you are having:  Do not use tampons or douches as told by your doctor.  Change your pads often. You should get regular pelvic exams and Pap tests. Keep all appointments for tests as told by your doctor. GET HELP IF:  You are  bleeding for more than 1 week.  You feel dizzy at times. GET HELP RIGHT AWAY IF:   You pass out.  You have to change pads every 15 to 30 minutes.  You have belly pain.  You have a fever.  You become sweaty or weak.  You are passing large blood clots from the vagina.  You feel sick to your stomach (nauseous) and throw up (vomit). MAKE SURE YOU:  Understand these instructions.  Will watch your condition.  Will get help right away if you are not doing well or get worse. Document Released: 07/22/2009 Document Revised: 09/29/2013 Document Reviewed: 04/23/2013 [facility] Patient Information 2015 [place], [place]. This information is not intended to replace advice given to you by your health care provider. Make sure you discuss any questions you have with your health care provider.

## 2015-03-19 NOTE — MAU Provider Note (Signed)
History     CSN: [id number]  Arrival date and time: 03/19/15 1443   First Provider Initiated Contact with Patient 03/19/15 1526      Chief Complaint  Patient presents with  . Abdominal Pain   HPI  Ms. DAJSHA MASSARO is a 44 y.o. [id number] who presents to MAU today with complaint of abdominal pain. The patient states a history of fibroids and AUB. She is a patient of [facility] and has an appointment for IUD insertion on 04/08/15. She states abdominal pain started on 03/03/15. She rates pain at 10/10 now. She has taken Ibuprofen and Aleve for pain without relief. She has also used heating pads on the area with minimal relief. She started bleeding heavily on 03/14/15. She states bleeding is improving today and denies clots at this time. She states that she was taking Megace to control her bleeding until the IUD insertion, but ran out on 02/28/15. She wasn't able to get it refilled until 03/16/15.   OB History    Gravida Para Term Preterm AB TAB SAB Ectopic Multiple Living   '2 2 0 0 0 0 0 0 0 2 '      Past Medical History  Diagnosis Date  . Asthma   . Pneumonia ~ 2000 X 1  . Anemia   . Legally blind in left eye, as defined in [place]   . Diabetes mellitus without complication     Past Surgical History  Procedure Laterality Date  . Cesarean section  1992    Family History  Problem Relation Age of Onset  . Asthma Mother   . Asthma Daughter     History  Substance Use Topics  . Smoking status: Current Some Day Smoker -- 0.25 packs/day for 20 years    Types: Cigarettes    Last Attempt to Quit: 01/18/2015  . Smokeless tobacco: Never Used  . Alcohol Use: Yes     Comment: 01/20/2015 "dank some alcohol on my birthday, cookouts,  etc,;  maybe once/month"    Allergies: No Known Allergies  Prescriptions prior to admission  Medication Sig Dispense Refill Last Dose  . albuterol (PROVENTIL HFA;VENTOLIN HFA) 108 (90 BASE) MCG/ACT inhaler Inhale 2 puffs into the lungs every 6 (six) hours as needed for  wheezing or shortness of breath. 3 Inhaler 3 rescue  . albuterol (PROVENTIL) (2.5 MG/3ML) 0.083% nebulizer solution Take 2.5 mg by nebulization every 6 (six) hours as needed for wheezing or shortness of breath.   prn at prn  . diphenhydramine-acetaminophen (TYLENOL PM) 25-500 MG TABS Take 2 tablets by mouth at bedtime as needed (sleep).   03/18/2015 at Unknown time  . ferrous sulfate 325 (65 FE) MG tablet Take 1 tablet (325 mg total) by mouth daily. 30 tablet 0 two weeks  . ibuprofen (ADVIL,MOTRIN) 200 MG tablet Take 400 mg by mouth every 6 (six) hours as needed for headache.   03/18/2015 at Unknown time  . megestrol (MEGACE) 40 MG tablet Take 1 tablet (40 mg total) by mouth daily. 30 tablet 5 03/19/2015 at Unknown time  . metFORMIN (GLUCOPHAGE) 500 MG tablet Take 1 tablet (500 mg total) by mouth 2 (two) times daily with a meal. 60 tablet 3 03/19/2015 at Unknown time  . mometasone-formoterol (DULERA) 100-5 MCG/ACT AERO Inhale 2 puffs into the lungs 2 (two) times daily. 1 Inhaler 3 03/19/2015 at Unknown time  . naproxen sodium (ANAPROX) 220 MG tablet Take 440 mg by mouth 2 (two) times daily as needed (pain).   03/18/2015 at  Unknown time  . nicotine (NICODERM CQ - DOSED IN MG/24 HOURS) 14 mg/24hr patch Place 1 patch (14 mg total) onto the skin daily. 28 patch 0 Past Month at Unknown time  . blood glucose meter kit and supplies KIT Dispense based on patient and insurance preference. Use up to four times daily as directed. (FOR ICD-9 250.00, 250.01). 1 each 0 Taking  . glucose blood (CHOICE DM FORA G20 TEST STRIPS) test strip Use as instructed 100 each 12   . glucose blood (TRUETEST TEST) test strip Check blood sugar TID & QHS 100 each 12   . Lancets (FREESTYLE) lancets Use as instructed 100 each 12   . TRUEPLUS LANCETS 28G MISC Check blood sugar TID & QHS 100 each 2     Review of Systems  Constitutional: Negative for fever and malaise/fatigue.  Gastrointestinal: Positive for abdominal pain. Negative for  nausea, vomiting, diarrhea and constipation.  Genitourinary: Negative for dysuria, urgency and frequency.       + vaginal bleeding   Physical Exam   Temperature 98.1 F (36.7 C), temperature source Oral, resp. rate 18, height 5' 3.5" (1.613 m), weight 314 lb 2 oz (142.486 kg), last menstrual period 03/10/2015.  Physical Exam  Nursing note and vitals reviewed. Constitutional: She is oriented to person, place, and time. She appears well-developed and well-nourished. No distress.  HENT:  Head: Normocephalic and atraumatic.  Cardiovascular: Normal rate.   Respiratory: Effort normal.  GI: Soft. She exhibits no distension and no mass. There is tenderness (mild lower abdominal tenderness to palpation). There is no rebound and no guarding.  Genitourinary:  Scant blood on pad after > 1 hour  Neurological: She is alert and oriented to person, place, and time.  Skin: Skin is warm and dry. No erythema.  Psychiatric: She has a normal mood and affect.   Results for orders placed or performed during the hospital encounter of 03/19/15 (from the past 24 hour(s))  Urinalysis, Routine w reflex microscopic (not at [facility])     Status: Abnormal   Collection Time: 03/19/15  2:58 PM  Result Value Ref Range   Color, Urine YELLOW YELLOW   APPearance CLEAR CLEAR   Specific Gravity, Urine 1.025 1.005 - 1.030   pH 6.0 5.0 - 8.0   Glucose, UA NEGATIVE NEGATIVE mg/dL   Hgb urine dipstick LARGE (A) NEGATIVE   Bilirubin Urine NEGATIVE NEGATIVE   Ketones, ur NEGATIVE NEGATIVE mg/dL   Protein, ur NEGATIVE NEGATIVE mg/dL   Urobilinogen, UA 0.2 0.0 - 1.0 mg/dL   Nitrite NEGATIVE NEGATIVE   Leukocytes, UA NEGATIVE NEGATIVE  Urine microscopic-add on     Status: Abnormal   Collection Time: 03/19/15  2:58 PM  Result Value Ref Range   Squamous Epithelial / LPF MANY (A) RARE   WBC, UA 0-2 <3 WBC/hpf   RBC / HPF 0-2 <3 RBC/hpf   Bacteria, UA FEW (A) RARE   Urine-Other      SPECIMEN <2.0 CC , MICROSCOPIC DONE ON  UNSPUN SAMPLE  Pregnancy, urine POC     Status: None   Collection Time: 03/19/15  3:28 PM  Result Value Ref Range   Preg Test, Ur NEGATIVE NEGATIVE  CBC with Differential/Platelet     Status: Abnormal (Preliminary result)   Collection Time: 03/19/15  3:46 PM  Result Value Ref Range   WBC 10.4 4.0 - 10.5 K/uL   RBC 4.24 3.87 - 5.11 MIL/uL   Hemoglobin 8.1 (L) 12.0 - 15.0 g/dL   HCT 28.8 (  L) 36.0 - 46.0 %   MCV 67.9 (L) 78.0 - 100.0 fL   MCH 19.1 (L) 26.0 - 34.0 pg   MCHC 28.1 (L) 30.0 - 36.0 g/dL   RDW 19.6 (H) 11.5 - 15.5 %   Platelets 549 (H) 150 - 400 K/uL   Neutrophils Relative % PENDING 43 - 77 %   Neutro Abs PENDING 1.7 - 7.7 K/uL   Band Neutrophils PENDING 0 - 10 %   Lymphocytes Relative PENDING 12 - 46 %   Lymphs Abs PENDING 0.7 - 4.0 K/uL   Monocytes Relative PENDING 3 - 12 %   Monocytes Absolute PENDING 0.1 - 1.0 K/uL   Eosinophils Relative PENDING 0 - 5 %   Eosinophils Absolute PENDING 0.0 - 0.7 K/uL   Basophils Relative PENDING 0 - 1 %   Basophils Absolute PENDING 0.0 - 0.1 K/uL   WBC Morphology PENDING    RBC Morphology PENDING    Smear Review PENDING    nRBC PENDING 0 /100 WBC   Metamyelocytes Relative PENDING %   Myelocytes PENDING %   Promyelocytes Absolute PENDING %   Blasts PENDING %    MAU Course  Procedures  MDM UPT - negative UA and CBC today 1 mg Dilaudid given in MAU Patient reports resolution of pain Vaginal bleeding improving and Hgb stable  Assessment and Plan  A: Abnormal Uterine Bleeding Uterine fibroids  P: Discharge home Rx for Percocet given to patient Bleeding precautions discussed Patient advised to follow-up with [facility] as schedule or sooner PRN for management of AUB Patient may return to MAU as needed or if her condition were to change or worsen   Luvenia Redden, PA-C  03/19/2015, 4:33 PM

## 2015-03-25 ENCOUNTER — Telehealth: Payer: Self-pay

## 2015-03-25 ENCOUNTER — Telehealth: Payer: Self-pay | Admitting: Internal Medicine

## 2015-03-25 NOTE — Telephone Encounter (Signed)
error 

## 2015-03-25 NOTE — Telephone Encounter (Signed)
Received notification from on call service that patient was having vaginal bleeding and instructed Patient to go to the ED Called to check on patient and patient stated she was seen last week at [facility] hospital and will follow  Up with them again tomorrow

## 2015-03-26 ENCOUNTER — Encounter (HOSPITAL_COMMUNITY): Payer: Self-pay | Admitting: *Deleted

## 2015-03-26 ENCOUNTER — Inpatient Hospital Stay (HOSPITAL_COMMUNITY)
Admission: AD | Admit: 2015-03-26 | Discharge: 2015-03-26 | Disposition: A | Discharge status: Home / Self Care | Payer: Self-pay | Source: Ambulatory Visit | Attending: Obstetrics & Gynecology | Admitting: Obstetrics & Gynecology

## 2015-03-26 DIAGNOSIS — N938 Other specified abnormal uterine and vaginal bleeding: Secondary | ICD-10-CM | POA: Insufficient documentation

## 2015-03-26 DIAGNOSIS — F17210 Nicotine dependence, cigarettes, uncomplicated: Secondary | ICD-10-CM | POA: Insufficient documentation

## 2015-03-26 DIAGNOSIS — R102 Pelvic and perineal pain: Secondary | ICD-10-CM | POA: Insufficient documentation

## 2015-03-26 LAB — CBC
HCT: 27.3 % — ABNORMAL LOW (ref 36.0–46.0)
HEMOGLOBIN: 7.7 g/dL — AB (ref 12.0–15.0)
MCH: 19.1 pg — AB (ref 26.0–34.0)
MCHC: 28.2 g/dL — ABNORMAL LOW (ref 30.0–36.0)
MCV: 67.6 fL — AB (ref 78.0–100.0)
Platelets: 570 10*3/uL — ABNORMAL HIGH (ref 150–400)
RBC: 4.04 MIL/uL (ref 3.87–5.11)
RDW: 19.5 % — ABNORMAL HIGH (ref 11.5–15.5)
WBC: 8 10*3/uL (ref 4.0–10.5)

## 2015-03-26 MED ORDER — MEGESTROL ACETATE 40 MG PO TABS: 80.0000 mg | ORAL_TABLET | Freq: Two times a day (BID) | ORAL | Status: DC

## 2015-03-26 MED ORDER — KETOROLAC TROMETHAMINE 60 MG/2ML IM SOLN
60.0000 mg | Freq: Once | INTRAMUSCULAR | Status: AC
  Administered 2015-03-26: 60 mg via INTRAMUSCULAR
  Filled 2015-03-26: qty 2

## 2015-03-26 MED ORDER — IBUPROFEN 800 MG PO TABS: 800.0000 mg | ORAL_TABLET | Freq: Four times a day (QID) | ORAL | Status: DC | PRN

## 2015-03-26 MED ORDER — OXYCODONE-ACETAMINOPHEN 5-325 MG PO TABS: 1.0000 | ORAL_TABLET | Freq: Four times a day (QID) | ORAL | Status: DC | PRN

## 2015-03-26 MED ORDER — FERROUS SULFATE 325 (65 FE) MG PO TABS: 325.0000 mg | ORAL_TABLET | Freq: Two times a day (BID) | ORAL | Status: DC

## 2015-03-26 NOTE — MAU Note (Signed)
Pt presents to MAU with complaints of pain in her left lower abdomen. States she was evaluated last Saturday for vaginal bleeding and was given medications to stop the bleeding but it hasn't stopped

## 2015-03-26 NOTE — MAU Provider Note (Signed)
History     CSN: [id number]  Arrival date and time: 03/26/15 1321   First Provider Initiated Contact with Patient 03/26/15 1353      Chief Complaint  Patient presents with  . Vaginal Bleeding   HPI 44 y.o. [id number] with ongoing DUB and pelvic pain, pt is perimenopausal and has 2 small fibroids noted on u/s 01/2015, has appt for IUD insertion to help w/ bleeding on 04/08/15. Pt was initially rx'd Megace 40 mg taper starting w/ TID x 3 days, pt states bleeding stopped, then did not recur until she ran out of Megace after rescheduling her IUD insertion appointment. Pt received a second rx for Megace, this time 56m QD w/ no taper, has been taking this dose for 2 weeks and her bleeding has not stopped. She was seen in MAU 1 week ago for same complaint, received percocet for pain, which has been helping with pain, Motrin not helping. Pt states bleeding is intermittently heavy, sometimes just spotting, also passes small - medium sized clots. Bleeding is currently not heavy.   Past Medical History  Diagnosis Date  . Asthma   . Pneumonia ~ 2000 X 1  . Anemia   . [place]  . Diabetes mellitus without complication     Past Surgical History  Procedure Laterality Date  . Cesarean section  1992    Family History  Problem Relation Age of Onset  . Asthma Mother   . Asthma Daughter     History  Substance Use Topics  . Smoking status: Current Some Day Smoker -- 0.25 packs/day for 20 years    Types: Cigarettes    Last Attempt to Quit: 01/18/2015  . Smokeless tobacco: Never Used  . Alcohol Use: Yes     Comment: 01/20/2015 "dank some alcohol on my birthday, cookouts,  etc,;  maybe once/month"    Allergies: No Known Allergies  Prescriptions prior to admission  Medication Sig Dispense Refill Last Dose  . albuterol (PROVENTIL HFA;VENTOLIN HFA) 108 (90 BASE) MCG/ACT inhaler Inhale 2 puffs into the lungs every 6 (six) hours as needed for wheezing or shortness  of breath. 3 Inhaler 3 rescue  . albuterol (PROVENTIL) (2.5 MG/3ML) 0.083% nebulizer solution Take 2.5 mg by nebulization every 6 (six) hours as needed for wheezing or shortness of breath.   prn at prn  . blood glucose meter kit and supplies KIT Dispense based on patient and insurance preference. Use up to four times daily as directed. (FOR ICD-9 250.00, 250.01). 1 each 0 Taking  . diphenhydramine-acetaminophen (TYLENOL PM) 25-500 MG TABS Take 2 tablets by mouth at bedtime as needed (sleep).   03/18/2015 at Unknown time  . ferrous sulfate 325 (65 FE) MG tablet Take 1 tablet (325 mg total) by mouth daily. 30 tablet 0 two weeks  . glucose blood (CHOICE DM FORA G20 TEST STRIPS) test strip Use as instructed 100 each 12   . glucose blood (TRUETEST TEST) test strip Check blood sugar TID & QHS 100 each 12   . ibuprofen (ADVIL,MOTRIN) 200 MG tablet Take 400 mg by mouth every 6 (six) hours as needed for headache.   03/18/2015 at Unknown time  . Lancets (FREESTYLE) lancets Use as instructed 100 each 12   . megestrol (MEGACE) 40 MG tablet Take 1 tablet (40 mg total) by mouth daily. 30 tablet 5 03/19/2015 at Unknown time  . metFORMIN (GLUCOPHAGE) 500 MG tablet Take 1 tablet (500 mg total) by mouth  2 (two) times daily with a meal. 60 tablet 3 03/19/2015 at Unknown time  . mometasone-formoterol (DULERA) 100-5 MCG/ACT AERO Inhale 2 puffs into the lungs 2 (two) times daily. 1 Inhaler 3 03/19/2015 at Unknown time  . naproxen sodium (ANAPROX) 220 MG tablet Take 440 mg by mouth 2 (two) times daily as needed (pain).   03/18/2015 at Unknown time  . nicotine (NICODERM CQ - DOSED IN MG/24 HOURS) 14 mg/24hr patch Place 1 patch (14 mg total) onto the skin daily. 28 patch 0 Past Month at Unknown time  . oxyCODONE-acetaminophen (PERCOCET/ROXICET) 5-325 MG per tablet Take 1 tablet by mouth every 6 (six) hours as needed for severe pain. 12 tablet 0   . TRUEPLUS LANCETS 28G MISC Check blood sugar TID & QHS 100 each 2     Review of  Systems  Constitutional: Positive for malaise/fatigue.  Respiratory: Positive for shortness of breath.   Cardiovascular: Negative.  Negative for chest pain.  Gastrointestinal: Positive for abdominal pain. Negative for nausea, vomiting, diarrhea and constipation.  Genitourinary: Negative for dysuria, urgency, frequency, hematuria and flank pain.       + bleeding   Musculoskeletal: Negative.   Neurological: Negative.  Negative for dizziness.  Psychiatric/Behavioral: Negative.    Physical Exam   Blood pressure 147/71, pulse 82, temperature 98.4 F (36.9 C), temperature source Oral, resp. rate 18, last menstrual period 03/10/2015, SpO2 100 %.  Physical Exam  Nursing note and vitals reviewed. Constitutional: She is oriented to person, place, and time. She appears well-developed and well-nourished. No distress.  Obese   Cardiovascular: Normal rate.   Respiratory: Effort normal.  Musculoskeletal: Normal range of motion.  Neurological: She is alert and oriented to person, place, and time.  Skin: Skin is warm.  Psychiatric: She has a normal mood and affect.    MAU Course  Procedures Results for orders placed or performed during the hospital encounter of 03/26/15 (from the past 24 hour(s))  CBC     Status: Abnormal   Collection Time: 03/26/15  1:50 PM  Result Value Ref Range   WBC 8.0 4.0 - 10.5 K/uL   RBC 4.04 3.87 - 5.11 MIL/uL   Hemoglobin 7.7 (L) 12.0 - 15.0 g/dL   HCT 27.3 (L) 36.0 - 46.0 %   MCV 67.6 (L) 78.0 - 100.0 fL   MCH 19.1 (L) 26.0 - 34.0 pg   MCHC 28.2 (L) 30.0 - 36.0 g/dL   RDW 19.5 (H) 11.5 - 15.5 %   Platelets 570 (H) 150 - 400 K/uL   Toradol 60 mg IM in MAU, pt reports excellent pain relief   Assessment and Plan   1. DUB (dysfunctional uterine bleeding)   Increase Megace to 80 mg BID, continue until IUD insertion. Increase FeSO4 to $Remove'325mg'[id number]$  BID. Motrin and Percocet rx provided for pain. Pt should keep scheduled appointment or follow up sooner w/ increased  pain or bleeding. Rev'd w/ Dr. Harolyn Rutherford.     Medication List    STOP taking these medications        naproxen sodium 220 MG tablet  Commonly known as:  ANAPROX      TAKE these medications        albuterol (2.5 MG/3ML) 0.083% nebulizer solution  Commonly known as:  PROVENTIL  Take 2.5 mg by nebulization every 6 (six) hours as needed for wheezing or shortness of breath.     albuterol 108 (90 BASE) MCG/ACT inhaler  Commonly known as:  PROVENTIL HFA;VENTOLIN HFA  Inhale 2  puffs into the lungs every 6 (six) hours as needed for wheezing or shortness of breath.     blood glucose meter kit and supplies Kit  Dispense based on patient and insurance preference. Use up to four times daily as directed. (FOR ICD-9 250.00, 250.01).     diphenhydramine-acetaminophen 25-500 MG Tabs  Commonly known as:  TYLENOL PM  Take 2 tablets by mouth at bedtime as needed (for sleep).     ferrous sulfate 325 (65 FE) MG tablet  Take 1 tablet (325 mg total) by mouth 2 (two) times daily with a meal.     freestyle lancets  Use as instructed     TRUEPLUS LANCETS 28G Misc  Check blood sugar TID & QHS     glucose blood test strip  Commonly known as:  CHOICE DM FORA G20 TEST STRIPS  Use as instructed     glucose blood test strip  Commonly known as:  TRUETEST TEST  Check blood sugar TID & QHS     ibuprofen 800 MG tablet  Commonly known as:  ADVIL,MOTRIN  Take 1 tablet (800 mg total) by mouth every 6 (six) hours as needed for headache or mild pain.     megestrol 40 MG tablet  Commonly known as:  MEGACE  Take 2 tablets (80 mg total) by mouth 2 (two) times daily.     metFORMIN 500 MG tablet  Commonly known as:  GLUCOPHAGE  Take 1 tablet (500 mg total) by mouth 2 (two) times daily with a meal.     mometasone-formoterol 100-5 MCG/ACT Aero  Commonly known as:  DULERA  Inhale 2 puffs into the lungs 2 (two) times daily.     nicotine 14 mg/24hr patch  Commonly known as:  NICODERM CQ - dosed in mg/24  hours  Place 1 patch (14 mg total) onto the skin daily.     oxyCODONE-acetaminophen 5-325 MG per tablet  Commonly known as:  PERCOCET/ROXICET  Take 1 tablet by mouth every 6 (six) hours as needed for severe pain.            Follow-up Information    Follow up with [facility] On 04/08/2015.   Specialty:  Obstetrics and Gynecology   Why:  as scheduled   Contact information:   [place] 03/26/2015, 1:53 PM

## 2015-03-26 NOTE — Discharge Instructions (Signed)

## 2015-04-02 IMAGING — CR DG CHEST 2V
2 series · 2 of 2 positions shown · non-contrast
Comparison: Chest radiograph January 08, 2014

CLINICAL DATA: Shortness of breath, chest pain.

EXAM:
CHEST  2 VIEW

[w chest pa]
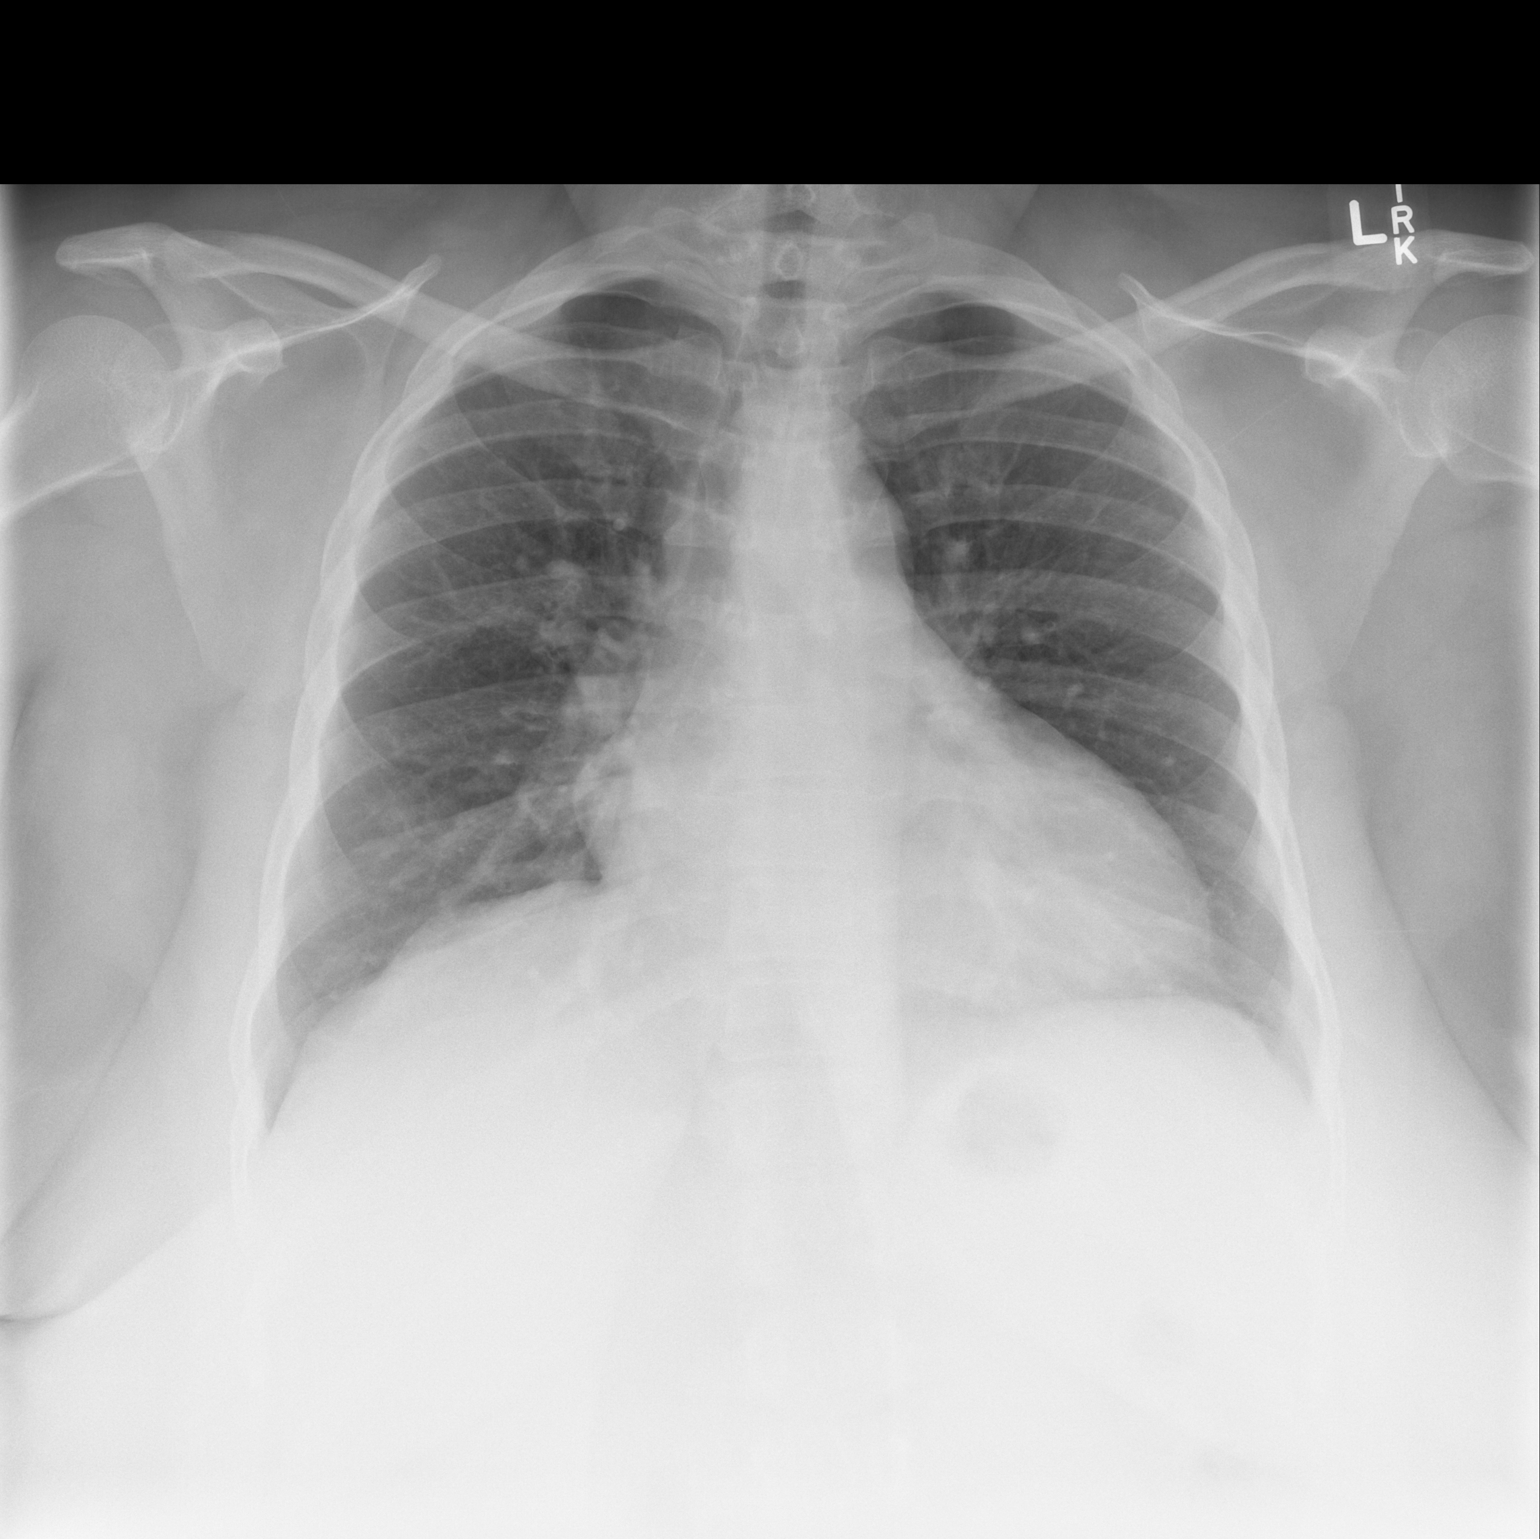

[w chest lat *]
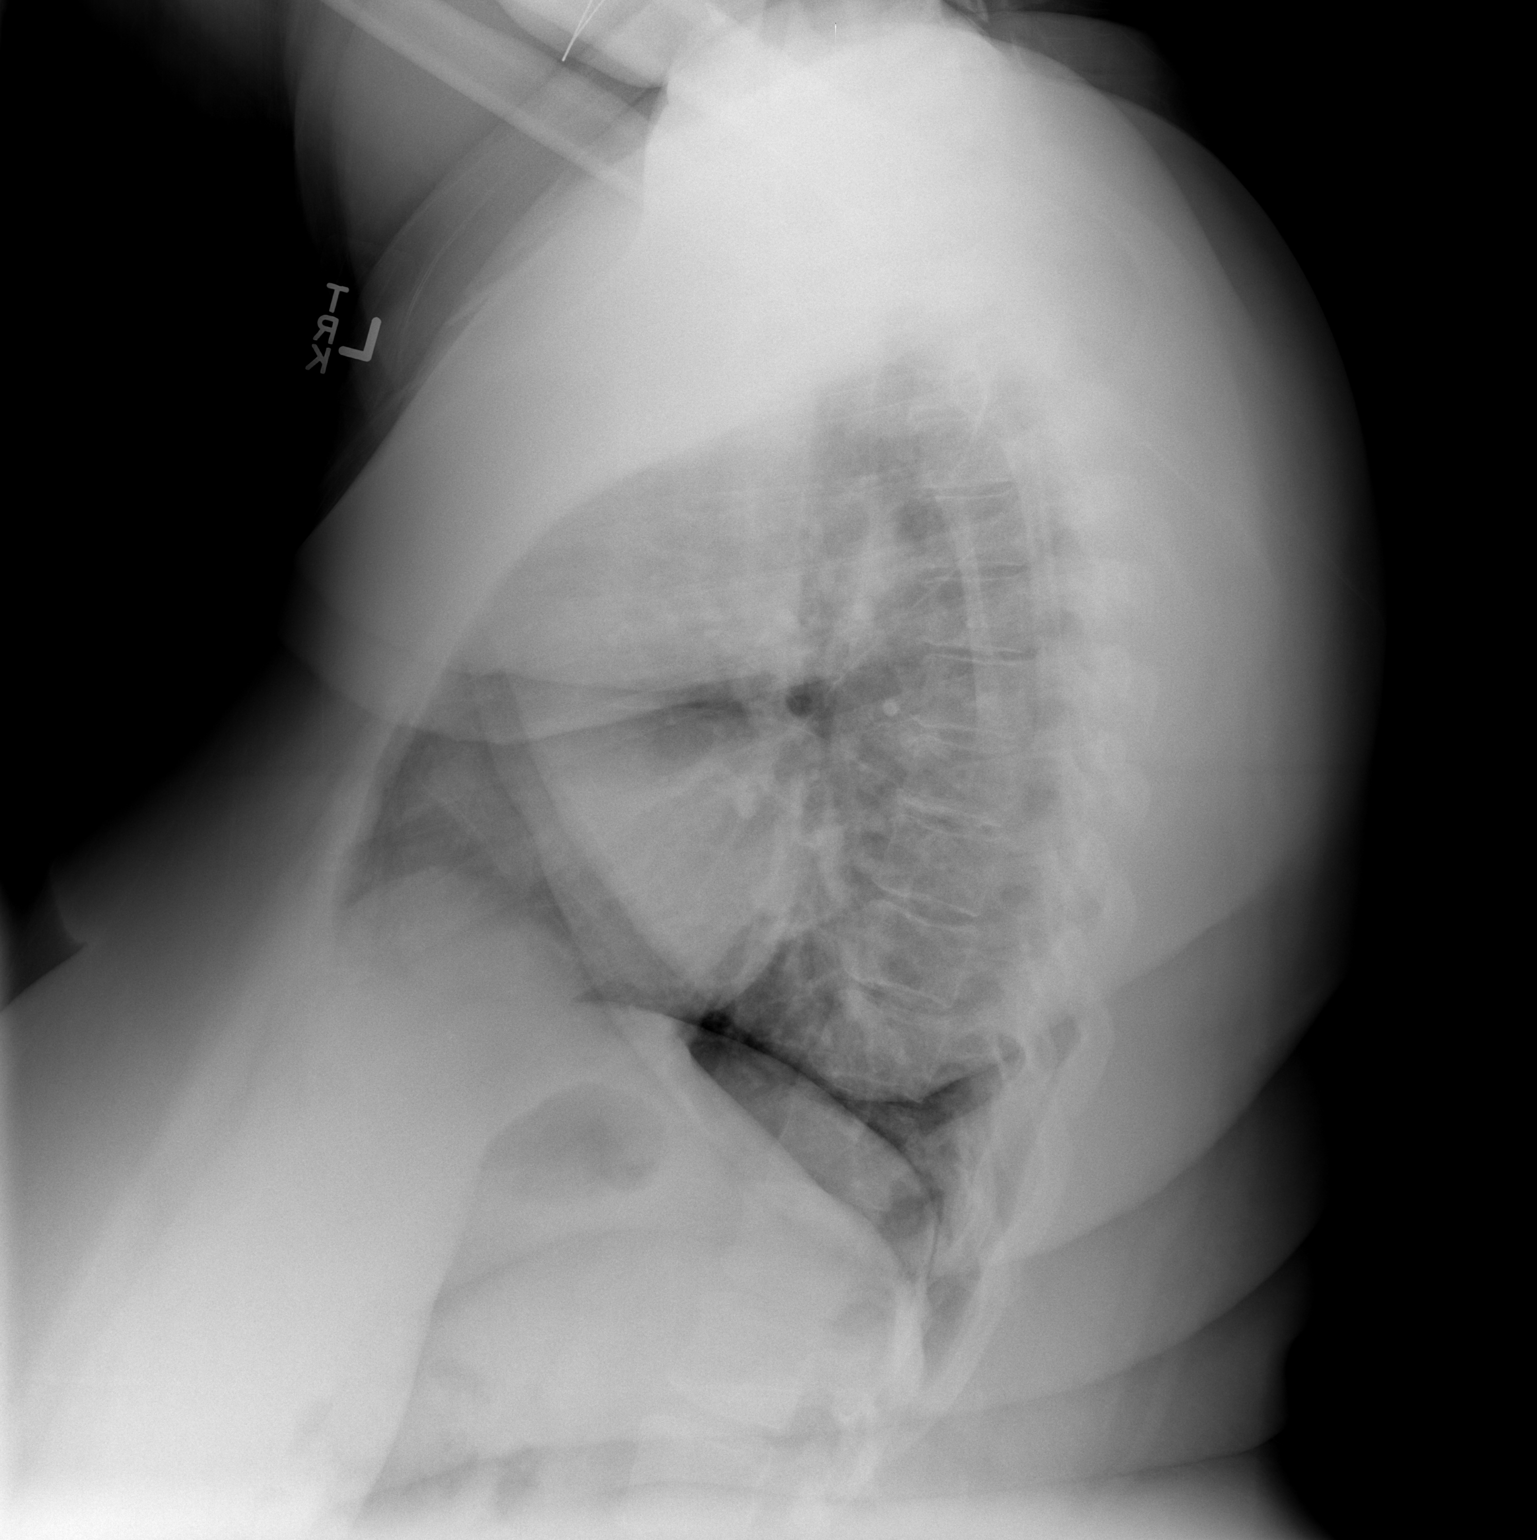

[2 of 2 positions shown; findings below may reference images not displayed]

FINDINGS: Heart size appears mildly enlarged, mediastinal silhouette is
unremarkable. No pleural effusions or focal consolidations. No
pneumothorax. Soft tissue planes and included osseous structures are
nonsuspicious. Large body habitus.
IMPRESSION: Mild cardiomegaly, no acute pulmonary process.

  By: Kieth Cal

## 2015-04-08 ENCOUNTER — Ambulatory Visit: Payer: Self-pay | Admitting: Obstetrics & Gynecology

## 2015-04-18 NOTE — Telephone Encounter (Signed)
Nurse spoke with patient on 06/072017

## 2015-05-06 ENCOUNTER — Ambulatory Visit: Payer: Self-pay | Attending: Internal Medicine | Admitting: Internal Medicine

## 2015-05-06 ENCOUNTER — Encounter: Payer: Self-pay | Admitting: Internal Medicine

## 2015-05-06 VITALS — BP 130/80 | HR 84 | Temp 98.0°F | Resp 16 | Wt 312.0 lb

## 2015-05-06 DIAGNOSIS — N289 Disorder of kidney and ureter, unspecified: Secondary | ICD-10-CM | POA: Insufficient documentation

## 2015-05-06 DIAGNOSIS — F17200 Nicotine dependence, unspecified, uncomplicated: Secondary | ICD-10-CM

## 2015-05-06 DIAGNOSIS — J45909 Unspecified asthma, uncomplicated: Secondary | ICD-10-CM | POA: Insufficient documentation

## 2015-05-06 DIAGNOSIS — H5442 Blindness, left eye, normal vision right eye: Secondary | ICD-10-CM | POA: Insufficient documentation

## 2015-05-06 DIAGNOSIS — R944 Abnormal results of kidney function studies: Secondary | ICD-10-CM | POA: Insufficient documentation

## 2015-05-06 DIAGNOSIS — E139 Other specified diabetes mellitus without complications: Secondary | ICD-10-CM

## 2015-05-06 DIAGNOSIS — L299 Pruritus, unspecified: Secondary | ICD-10-CM | POA: Insufficient documentation

## 2015-05-06 DIAGNOSIS — Z720 Tobacco use: Secondary | ICD-10-CM

## 2015-05-06 DIAGNOSIS — E119 Type 2 diabetes mellitus without complications: Secondary | ICD-10-CM | POA: Insufficient documentation

## 2015-05-06 DIAGNOSIS — Z8709 Personal history of other diseases of the respiratory system: Secondary | ICD-10-CM

## 2015-05-06 DIAGNOSIS — Z79899 Other long term (current) drug therapy: Secondary | ICD-10-CM | POA: Insufficient documentation

## 2015-05-06 DIAGNOSIS — F17210 Nicotine dependence, cigarettes, uncomplicated: Secondary | ICD-10-CM | POA: Insufficient documentation

## 2015-05-06 DIAGNOSIS — R21 Rash and other nonspecific skin eruption: Secondary | ICD-10-CM | POA: Insufficient documentation

## 2015-05-06 LAB — COMPLETE METABOLIC PANEL WITH GFR
ALBUMIN: 3.7 g/dL (ref 3.6–5.1)
ALT: 14 U/L (ref 6–29)
AST: 12 U/L (ref 10–30)
Alkaline Phosphatase: 66 U/L (ref 33–115)
BUN: 15 mg/dL (ref 7–25)
CO2: 23 mmol/L (ref 20–31)
CREATININE: 1.02 mg/dL (ref 0.50–1.10)
Calcium: 9.5 mg/dL (ref 8.6–10.2)
Chloride: 107 mmol/L (ref 98–110)
GFR, EST AFRICAN AMERICAN: 78 mL/min (ref >=60)
GFR, EST NON AFRICAN AMERICAN: 68 mL/min (ref >=60)
Glucose, Bld: 88 mg/dL (ref 65–99)
POTASSIUM: 4.6 mmol/L (ref 3.5–5.3)
Sodium: 143 mmol/L (ref 135–146)
TOTAL PROTEIN: 6.5 g/dL (ref 6.1–8.1)
Total Bilirubin: 0.2 mg/dL (ref 0.2–1.2)

## 2015-05-06 LAB — POCT GLYCOSYLATED HEMOGLOBIN (HGB A1C): Hemoglobin A1C: 6

## 2015-05-06 LAB — GLUCOSE, POCT (MANUAL RESULT ENTRY): POC Glucose: 119 mg/dl — AB (ref 70–99)

## 2015-05-06 MED ORDER — METHYLPREDNISOLONE 4 MG PO TBPK: ORAL_TABLET | ORAL | Status: DC

## 2015-05-06 MED ORDER — CLOTRIMAZOLE-BETAMETHASONE 1-0.05 % EX CREA: 1.0000 "application " | TOPICAL_CREAM | Freq: Two times a day (BID) | CUTANEOUS | Status: DC

## 2015-05-06 MED ORDER — CETIRIZINE HCL 10 MG PO TABS: 10.0000 mg | ORAL_TABLET | Freq: Every day | ORAL | Status: DC

## 2015-05-06 NOTE — Patient Instructions (Addendum)
DASH Eating Plan DASH stands for "Dietary Approaches to Stop Hypertension." The DASH eating plan is a healthy eating plan that has been shown to reduce high blood pressure (hypertension). Additional health benefits may include reducing the risk of type 2 diabetes mellitus, heart disease, and stroke. The DASH eating plan may also help with weight loss. WHAT DO I NEED TO KNOW ABOUT THE DASH EATING PLAN? For the DASH eating plan, you will follow these general guidelines:  Choose foods with a percent daily value for sodium of less than 5% (as listed on the food label).  Use salt-free seasonings or herbs instead of table salt or sea salt.  Check with your health care provider or pharmacist before using salt substitutes.  Eat lower-sodium products, often labeled as "lower sodium" or "no salt added."  Eat fresh foods.  Eat more vegetables, fruits, and low-fat dairy products.  Choose whole grains. Look for the word "whole" as the first word in the ingredient list.  Choose fish and skinless chicken or turkey more often than red meat. Limit fish, poultry, and meat to 6 oz (170 g) each day.  Limit sweets, desserts, sugars, and sugary drinks.  Choose heart-healthy fats.  Limit cheese to 1 oz (28 g) per day.  Eat more home-cooked food and less restaurant, buffet, and fast food.  Limit fried foods.  Cook foods using methods other than frying.  Limit canned vegetables. If you do use them, rinse them well to decrease the sodium.  When eating at a restaurant, ask that your food be prepared with less salt, or no salt if possible. WHAT FOODS CAN I EAT? Seek help from a dietitian for individual calorie needs. Grains Whole grain or whole wheat bread. Brown rice. Whole grain or whole wheat pasta. Quinoa, bulgur, and whole grain cereals. Low-sodium cereals. Corn or whole wheat flour tortillas. Whole grain cornbread. Whole grain crackers. Low-sodium crackers. Vegetables Fresh or frozen vegetables  (raw, steamed, roasted, or grilled). Low-sodium or reduced-sodium tomato and vegetable juices. Low-sodium or reduced-sodium tomato sauce and paste. Low-sodium or reduced-sodium canned vegetables.  Fruits All fresh, canned (in natural juice), or frozen fruits. Meat and Other Protein Products Ground beef (85% or leaner), grass-fed beef, or beef trimmed of fat. Skinless chicken or turkey. Ground chicken or turkey. Pork trimmed of fat. All fish and seafood. Eggs. Dried beans, peas, or lentils. Unsalted nuts and seeds. Unsalted canned beans. Dairy Low-fat dairy products, such as skim or 1% milk, 2% or reduced-fat cheeses, low-fat ricotta or cottage cheese, or plain low-fat yogurt. Low-sodium or reduced-sodium cheeses. Fats and Oils Tub margarines without trans fats. Light or reduced-fat mayonnaise and salad dressings (reduced sodium). Avocado. Safflower, olive, or canola oils. Natural peanut or almond butter. Other Unsalted popcorn and pretzels. The items listed above may not be a complete list of recommended foods or beverages. Contact your dietitian for more options. WHAT FOODS ARE NOT RECOMMENDED? Grains White bread. White pasta. White rice. Refined cornbread. Bagels and croissants. Crackers that contain trans fat. Vegetables Creamed or fried vegetables. Vegetables in a cheese sauce. Regular canned vegetables. Regular canned tomato sauce and paste. Regular tomato and vegetable juices. Fruits Dried fruits. Canned fruit in light or heavy syrup. Fruit juice. Meat and Other Protein Products Fatty cuts of meat. Ribs, chicken wings, bacon, sausage, bologna, salami, chitterlings, fatback, hot dogs, bratwurst, and packaged luncheon meats. Salted nuts and seeds. Canned beans with salt. Dairy Whole or 2% milk, cream, half-and-half, and cream cheese. Whole-fat or sweetened yogurt. Full-fat   cheeses or blue cheese. Nondairy creamers and whipped toppings. Processed cheese, cheese spreads, or cheese  curds. Condiments Onion and garlic salt, seasoned salt, table salt, and sea salt. Canned and packaged gravies. Worcestershire sauce. Tartar sauce. Barbecue sauce. Teriyaki sauce. Soy sauce, including reduced sodium. Steak sauce. Fish sauce. Oyster sauce. Cocktail sauce. Horseradish. Ketchup and mustard. Meat flavorings and tenderizers. Bouillon cubes. Hot sauce. Tabasco sauce. Marinades. Taco seasonings. Relishes. Fats and Oils Butter, stick margarine, lard, shortening, ghee, and bacon fat. Coconut, palm kernel, or palm oils. Regular salad dressings. Other Pickles and olives. Salted popcorn and pretzels. The items listed above may not be a complete list of foods and beverages to avoid. Contact your dietitian for more information. WHERE CAN I FIND MORE INFORMATION? National Heart, Lung, and Blood Institute: www.nhlbi.nih.gov/health/health-topics/topics/dash/ Document Released: 09/13/2011 Document Revised: 02/08/2014 Document Reviewed: 07/29/2013 [employer] Patient Information 2015 [employer]. This information is not intended to replace advice given to you by your health care provider. Make sure you discuss any questions you have with your health care provider. Diabetes Mellitus and Food It is important for you to manage your blood sugar (glucose) level. Your blood glucose level can be greatly affected by what you eat. Eating healthier foods in the appropriate amounts throughout the day at about the same time each day will help you control your blood glucose level. It can also help slow or prevent worsening of your diabetes mellitus. Healthy eating may even help you improve the level of your blood pressure and reach or maintain a healthy weight.  HOW CAN FOOD AFFECT ME? Carbohydrates Carbohydrates affect your blood glucose level more than any other type of food. Your dietitian will help you determine how many carbohydrates to eat at each meal and teach you how to count carbohydrates. Counting  carbohydrates is important to keep your blood glucose at a healthy level, especially if you are using insulin or taking certain medicines for diabetes mellitus. Alcohol Alcohol can cause sudden decreases in blood glucose (hypoglycemia), especially if you use insulin or take certain medicines for diabetes mellitus. Hypoglycemia can be a life-threatening condition. Symptoms of hypoglycemia (sleepiness, dizziness, and disorientation) are similar to symptoms of having too much alcohol.  If your health care provider has given you approval to drink alcohol, do so in moderation and use the following guidelines:  Women should not have more than one drink per day, and men should not have more than two drinks per day. One drink is equal to:  12 oz of beer.  5 oz of wine.  1 oz of hard liquor.  Do not drink on an empty stomach.  Keep yourself hydrated. Have water, diet soda, or unsweetened iced tea.  Regular soda, juice, and other mixers might contain a lot of carbohydrates and should be counted. WHAT FOODS ARE NOT RECOMMENDED? As you make food choices, it is important to remember that all foods are not the same. Some foods have fewer nutrients per serving than other foods, even though they might have the same number of calories or carbohydrates. It is difficult to get your body what it needs when you eat foods with fewer nutrients. Examples of foods that you should avoid that are high in calories and carbohydrates but low in nutrients include:  Trans fats (most processed foods list trans fats on the Nutrition Facts label).  Regular soda.  Juice.  Candy.  Sweets, such as cake, pie, doughnuts, and cookies.  Fried foods. WHAT FOODS CAN I EAT? Have nutrient-rich foods,   which will nourish your body and keep you healthy. The food you should eat also will depend on several factors, including:  The calories you need.  The medicines you take.  Your weight.  Your blood glucose level.  Your  blood pressure level.  Your cholesterol level. You also should eat a variety of foods, including:  Protein, such as meat, poultry, fish, tofu, nuts, and seeds (lean animal proteins are best).  Fruits.  Vegetables.  Dairy products, such as milk, cheese, and yogurt (low fat is best).  Breads, grains, pasta, cereal, rice, and beans.  Fats such as olive oil, trans fat-free margarine, canola oil, avocado, and olives. DOES EVERYONE WITH DIABETES MELLITUS HAVE THE SAME MEAL PLAN? Because every person with diabetes mellitus is different, there is not one meal plan that works for everyone. It is very important that you meet with a dietitian who will help you create a meal plan that is just right for you. Document Released: 06/21/2005 Document Revised: 09/29/2013 Document Reviewed: 08/21/2013 [facility] Patient Information 2015 [place], [place]. This information is not intended to replace advice given to you by your health care provider. Make sure you discuss any questions you have with your health care provider. Smoking Cessation Quitting smoking is important to your health and has many advantages. However, it is not always easy to quit since nicotine is a very addictive drug. Oftentimes, people try 3 times or more before being able to quit. This document explains the best ways for you to prepare to quit smoking. Quitting takes hard work and a lot of effort, but you can do it. ADVANTAGES OF QUITTING SMOKING  You will live longer, feel better, and live better.  Your body will feel the impact of quitting smoking almost immediately.  Within 20 minutes, blood pressure decreases. Your pulse returns to its normal level.  After 8 hours, carbon monoxide levels in the blood return to normal. Your oxygen level increases.  After 24 hours, the chance of having a heart attack starts to decrease. Your breath, hair, and body stop smelling like smoke.  After 48 hours, damaged nerve endings begin to recover.  Your sense of taste and smell improve.  After 72 hours, the body is virtually free of nicotine. Your bronchial tubes relax and breathing becomes easier.  After 2 to 12 weeks, lungs can hold more air. Exercise becomes easier and circulation improves.  The risk of having a heart attack, stroke, cancer, or lung disease is greatly reduced.  After 1 year, the risk of coronary heart disease is cut in half.  After 5 years, the risk of stroke falls to the same as a nonsmoker.  After 10 years, the risk of lung cancer is cut in half and the risk of other cancers decreases significantly.  After 15 years, the risk of coronary heart disease drops, usually to the level of a nonsmoker.  If you are pregnant, quitting smoking will improve your chances of having a healthy baby.  The people you live with, especially any children, will be healthier.  You will have extra money to spend on things other than cigarettes. QUESTIONS TO THINK ABOUT BEFORE ATTEMPTING TO QUIT You may want to talk about your answers with your health care provider.  Why do you want to quit?  If you tried to quit in the past, what helped and what did not?  What will be the most difficult situations for you after you quit? How will you plan to handle them?  Who  can help you through the tough times? Your family? Friends? A health care provider?  What pleasures do you get from smoking? What ways can you still get pleasure if you quit? Here are some questions to ask your health care provider:  How can you help me to be successful at quitting?  What medicine do you think would be best for me and how should I take it?  What should I do if I need more help?  What is smoking withdrawal like? How can I get information on withdrawal? GET READY  Set a quit date.  Change your environment by getting rid of all cigarettes, ashtrays, matches, and lighters in your home, car, or work. Do not let people smoke in your home.  Review  your past attempts to quit. Think about what worked and what did not. GET SUPPORT AND ENCOURAGEMENT You have a better chance of being successful if you have help. You can get support in many ways.  Tell your family, friends, and coworkers that you are going to quit and need their support. Ask them not to smoke around you.  Get individual, group, or telephone counseling and support. Programs are available at [employer] and health centers. Call your local health department for information about programs in your area.  Spiritual beliefs and practices may help some smokers quit.  Download a "quit meter" on your computer to keep track of quit statistics, such as how long you have gone without smoking, cigarettes not smoked, and money saved.  Get a self-help book about quitting smoking and staying off tobacco. [place] yourself from urges to smoke. Talk to someone, go for a walk, or occupy your time with a task.  Change your normal routine. Take a different route to work. Drink tea instead of coffee. Eat breakfast in a different place.  Reduce your stress. Take a hot bath, exercise, or read a book.  Plan something enjoyable to do every day. Reward yourself for not smoking.  Explore interactive web-based programs that specialize in helping you quit. GET MEDICINE AND USE IT CORRECTLY Medicines can help you stop smoking and decrease the urge to smoke. Combining medicine with the above behavioral methods and support can greatly increase your chances of successfully quitting smoking.  Nicotine replacement therapy helps deliver nicotine to your body without the negative effects and risks of smoking. Nicotine replacement therapy includes nicotine gum, lozenges, inhalers, nasal sprays, and skin patches. Some may be available over-the-counter and others require a prescription.  Antidepressant medicine helps people abstain from smoking, but how this works is unknown.  This medicine is available by prescription.  Nicotinic receptor partial agonist medicine simulates the effect of nicotine in your brain. This medicine is available by prescription. Ask your health care provider for advice about which medicines to use and how to use them based on your health history. Your health care provider will tell you what side effects to look out for if you choose to be on a medicine or therapy. Carefully read the information on the package. Do not use any other product containing nicotine while using a nicotine replacement product.  RELAPSE OR DIFFICULT SITUATIONS Most relapses occur within the first 3 months after quitting. Do not be discouraged if you start smoking again. Remember, most people try several times before finally quitting. You may have symptoms of withdrawal because your body is used to nicotine. You may crave cigarettes, be irritable, feel very hungry, cough often, get  headaches, or have difficulty concentrating. The withdrawal symptoms are only temporary. They are strongest when you first quit, but they will go away within 10-14 days. To reduce the chances of relapse, try to:  Avoid drinking alcohol. Drinking lowers your chances of successfully quitting.  Reduce the amount of caffeine you consume. Once you quit smoking, the amount of caffeine in your body increases and can give you symptoms, such as a rapid heartbeat, sweating, and anxiety.  Avoid smokers because they can make you want to smoke.  Do not let weight gain distract you. Many smokers will gain weight when they quit, usually less than 10 pounds. Eat a healthy diet and stay active. You can always lose the weight gained after you quit.  Find ways to improve your mood other than smoking. FOR MORE INFORMATION  www.smokefree.gov  Document Released: 09/18/2001 Document Revised: 02/08/2014 Document Reviewed: 01/03/2012 [facility] Patient Information 2015 [place], [place]. This information is not intended  to replace advice given to you by your health care provider. Make sure you discuss any questions you have with your health care provider.

## 2015-05-06 NOTE — Progress Notes (Signed)
MRN: [id number] Name: Hannah Baldwin  Sex: female Age: 44 y.o. DOB: Jan 04, 1971  Allergies: Review of patient's allergies indicates no known allergies.  Chief Complaint  Patient presents with  . Rash    HPI: Patient is 44 y.o. female who has history of diabetes, asthma comes today for followup as per patient she is compliant in taking her medication, her hemoglobin A1c is improved, she denies any hypoglycemic symptoms, today her major concern is itchy rash in her groin and under the breast, she has tried over-the-counter medications without much improvement. Patient is still smoking cigarettes, I have counseled patient to quit smoking. Previous blood work reviewed with the patient noticed borderline elevated creatinine level, she's advised to have any over-the-counter NSAIDs.  Past Medical History  Diagnosis Date  . Asthma   . Pneumonia ~ 2000 X 1  . Anemia   . Legally blind in left eye, as defined in [place]   . Diabetes mellitus without complication     Past Surgical History  Procedure Laterality Date  . Cesarean section  1992      Medication List       This list is accurate as of: 05/06/15 10:46 AM.  Always use your most recent med list.               albuterol (2.5 MG/3ML) 0.083% nebulizer solution  Commonly known as:  PROVENTIL  Take 2.5 mg by nebulization every 6 (six) hours as needed for wheezing or shortness of breath.     albuterol 108 (90 BASE) MCG/ACT inhaler  Commonly known as:  PROVENTIL HFA;VENTOLIN HFA  Inhale 2 puffs into the lungs every 6 (six) hours as needed for wheezing or shortness of breath.     blood glucose meter kit and supplies Kit  Dispense based on patient and insurance preference. Use up to four times daily as directed. (FOR ICD-9 250.00, 250.01).     cetirizine 10 MG tablet  Commonly known as:  ZYRTEC  Take 1 tablet (10 mg total) by mouth daily.     clotrimazole-betamethasone cream  Commonly known as:  LOTRISONE  Apply 1 application  topically 2 (two) times daily.     diphenhydramine-acetaminophen 25-500 MG Tabs  Commonly known as:  TYLENOL PM  Take 2 tablets by mouth at bedtime as needed (for sleep).     ferrous sulfate 325 (65 FE) MG tablet  Take 1 tablet (325 mg total) by mouth 2 (two) times daily with a meal.     freestyle lancets  Use as instructed     TRUEPLUS LANCETS 28G Misc  Check blood sugar TID & QHS     glucose blood test strip  Commonly known as:  CHOICE DM FORA G20 TEST STRIPS  Use as instructed     glucose blood test strip  Commonly known as:  TRUETEST TEST  Check blood sugar TID & QHS     ibuprofen 800 MG tablet  Commonly known as:  ADVIL,MOTRIN  Take 1 tablet (800 mg total) by mouth every 6 (six) hours as needed for headache or mild pain.     megestrol 40 MG tablet  Commonly known as:  MEGACE  Take 2 tablets (80 mg total) by mouth 2 (two) times daily.     metFORMIN 500 MG tablet  Commonly known as:  GLUCOPHAGE  Take 1 tablet (500 mg total) by mouth 2 (two) times daily with a meal.     methylPREDNISolone 4 MG Tbpk tablet  Commonly known  as:  MEDROL DOSEPAK  follow package directions     mometasone-formoterol 100-5 MCG/ACT Aero  Commonly known as:  DULERA  Inhale 2 puffs into the lungs 2 (two) times daily.     nicotine 14 mg/24hr patch  Commonly known as:  NICODERM CQ - dosed in mg/24 hours  Place 1 patch (14 mg total) onto the skin daily.     oxyCODONE-acetaminophen 5-325 MG per tablet  Commonly known as:  PERCOCET/ROXICET  Take 1 tablet by mouth every 6 (six) hours as needed for severe pain.        Meds ordered this encounter  Medications  . methylPREDNISolone (MEDROL DOSEPAK) 4 MG TBPK tablet    Sig: follow package directions    Dispense:  21 tablet    Refill:  0  . cetirizine (ZYRTEC) 10 MG tablet    Sig: Take 1 tablet (10 mg total) by mouth daily.    Dispense:  30 tablet    Refill:  11  . clotrimazole-betamethasone (LOTRISONE) cream    Sig: Apply 1 application  topically 2 (two) times daily.    Dispense:  30 g    Refill:  1     There is no immunization history on file for this patient.  Family History  Problem Relation Age of Onset  . Asthma Mother   . Asthma Daughter     History  Substance Use Topics  . Smoking status: Current Some Day Smoker -- 0.25 packs/day for 20 years    Types: Cigarettes    Last Attempt to Quit: 01/18/2015  . Smokeless tobacco: Never Used  . Alcohol Use: Yes     Comment: 01/20/2015 "dank some alcohol on my birthday, cookouts,  etc,;  maybe once/month"    Review of Systems   As noted in HPI  Filed Vitals:   05/06/15 1037  BP: 130/80  Pulse:   Temp:   Resp:     Physical Exam  Physical Exam  Constitutional: No distress.  Eyes: EOM are normal. Pupils are equal, round, and reactive to light.  Cardiovascular: Normal rate and regular rhythm.   Pulmonary/Chest: Breath sounds normal. No respiratory distress. She has no rales.  Minimal wheezing  Musculoskeletal: She exhibits no edema.    Labs   Lab Results  Component Value Date   WBC 8.0 03/26/2015   HGB 7.7* 03/26/2015   HCT 27.3* 03/26/2015   PLT 570* 03/26/2015   GLUCOSE 327* 01/21/2015   CHOL 127 01/20/2015   TRIG 75 01/20/2015   HDL 37* 01/20/2015   LDLCALC 75 01/20/2015   NA 137 01/21/2015   K 3.7 01/21/2015   CL 104 01/21/2015   CREATININE 1.13* 01/21/2015   BUN 11 01/21/2015   CO2 22 01/21/2015   TSH 0.651 06/08/2014   HGBA1C 6.0 05/06/2015    Lab Results  Component Value Date   HGBA1C 6.0 05/06/2015   HGBA1C 7.2* 01/20/2015     Assessment and Plan  Other specified diabetes mellitus without complications - Plan:  Results for orders placed or performed in visit on 05/06/15  Glucose (CBG)  Result Value Ref Range   POC Glucose 119 (A) 70 - 99 mg/dl  HgB A1c  Result Value Ref Range   Hemoglobin A1C 6.0    Hemoglobin A1c has trended down, continue with metformin, continue with low carbohydrate diet, will repeat blood  chemistry  COMPLETE METABOLIC PANEL WITH GFR  Rash and nonspecific skin eruption - Plan: methylPREDNISolone (MEDROL DOSEPAK) 4 MG TBPK tablet, clotrimazole-betamethasone (LOTRISONE)  cream  History of asthma Continue with Dulera, Lipitor when necessary, again counseled patient to quit smoking   Itching - Plan: cetirizine (ZYRTEC) 10 MG tablet  Renal insufficiency - Plan: advised patient to avoid any NSAIDs, repeat blood chemistry COMPLETE METABOLIC PANEL WITH GFR    Return in about 3 months (around 08/06/2015), or if symptoms worsen or fail to improve.   This note has been created with Surveyor, quantity. Any transcriptional errors are unintentional.    Lorayne Marek, MD

## 2015-05-06 NOTE — Progress Notes (Signed)
Patient here for follow up on her diabetes Complains of rash to her chest area that is every itchy OTC creams have not been helpful

## 2015-05-09 ENCOUNTER — Telehealth: Payer: Self-pay

## 2015-05-09 NOTE — Telephone Encounter (Signed)
Patient is aware of her lab results 

## 2015-05-09 NOTE — Telephone Encounter (Signed)
-----   Message from Lorayne Marek, MD sent at 05/09/2015  9:48 AM EDT ----- Call and let the Patient know that blood work is normal.

## 2015-05-23 ENCOUNTER — Telehealth: Payer: Self-pay

## 2015-05-23 ENCOUNTER — Telehealth: Payer: Self-pay | Admitting: *Deleted

## 2015-05-23 DIAGNOSIS — R52 Pain, unspecified: Secondary | ICD-10-CM

## 2015-05-23 DIAGNOSIS — N938 Other specified abnormal uterine and vaginal bleeding: Secondary | ICD-10-CM

## 2015-05-23 MED ORDER — TRAMADOL HCL 50 MG PO TABS: 50.0000 mg | ORAL_TABLET | Freq: Four times a day (QID) | ORAL | Status: DC | PRN

## 2015-05-23 MED ORDER — MEGESTROL ACETATE 40 MG PO TABS: 40.0000 mg | ORAL_TABLET | Freq: Two times a day (BID) | ORAL | Status: DC

## 2015-05-23 NOTE — Telephone Encounter (Signed)
Nurse returned call to patient, patient verified date of birth. Patient aware of need to call and request Susa Simmonds to refill prescription for megace with instructions of 2 tab BID.  Patient aware of several attempts made by [facility] to contact provider for refills.  Patient entered telephone number into cell phone, [phone], to request medication refill from [facility]. Telephone call was disconnected when patient entered telephone number.

## 2015-05-23 NOTE — Telephone Encounter (Signed)
Hannah Baldwin left a message and called front desk stating she has not heard back. Nurse notified. Called Hannah Baldwin and she reports she is bleeding very heavy and needs a refill of her megace. States pharmacy will not refill it because they told her it wasn't time.  States one doctor told her to take it once a day, another twice a day and before on a taper. States she ran out a few days ago and is now bleeding heavy enough and passing clots that she has to change her pad as often as 10 minutes some of the time.  I advised her to come to MAU for evaluation. She states she does not need to come to MAU, she just needs her medicine.  I informed her I would need to get order from MD and call  Her back.  She also asks for pain medicine.   Called Dr. Harolyn Rutherford and informed her of patient complaints of heavy bleeding and refusal to come to MAU. Chart reviewed.  Order given for megace and ibuprofen if patient allowed to have ibuprofen. If not allowed to have ibuprofen may have tramadol.   Called [name] and explained megace order being sent to [facility] and also discussed Dr. Harolyn Rutherford preferred to send in prescription for ibuprofen for pain is she is allowed to take ibuprofen. She states her medical doctor told her not to take ibuprofen. I informed her we would sent in prescription for tramadol for pain. She voices understanding.

## 2015-05-23 NOTE — Telephone Encounter (Signed)
Patient called, verified date of birth.  Patient explains she is having heavy menstrual cycles.  Provider at [facility] put her on medication that starts with "M" for heavy bleeding.  When patient picked up last prescription bottle read to take medication "1 tablet daily". Patient was told previously per prescription to take 2 tablets 2 times a day. Patient is requesting medication to be refilled and explains pharmacy will not fill medication because it is too early.  Nurse spoke with pharmacy.  Two different doctors in same practice prescribed medication. Most recent instructions are 2 tab BID however when patient had medication refilled there were no refills on that prescription. There were refills on megace 1 tab daily.  Per pharmacy they have advised patient to call providers office and request refills for medication instructions of 2 tabs BID.  Pharmacy has sent several requests for refill request.  Nurse will call patient and advise patient to call provider.

## 2015-05-27 ENCOUNTER — Telehealth: Payer: Self-pay

## 2015-05-27 NOTE — Telephone Encounter (Signed)
Nurse called patient, patient verified date of birth. Patient reports picking up medication from pharmacy.  Patient does not have any other needs at this time.

## 2015-07-14 ENCOUNTER — Other Ambulatory Visit: Payer: Self-pay | Admitting: Family Medicine

## 2015-07-15 ENCOUNTER — Telehealth: Payer: Self-pay | Admitting: Internal Medicine

## 2015-07-15 NOTE — Telephone Encounter (Signed)
Patient called requesting med refill on traMADol (ULTRAM) 50 MG tablet, megestrol (MEGACE) 40 MG tablet, and a cream for a rash she has. Please f/u with pt.

## 2015-07-19 ENCOUNTER — Telehealth: Payer: Self-pay

## 2015-07-19 DIAGNOSIS — R52 Pain, unspecified: Secondary | ICD-10-CM

## 2015-07-19 DIAGNOSIS — N938 Other specified abnormal uterine and vaginal bleeding: Secondary | ICD-10-CM

## 2015-07-19 NOTE — Telephone Encounter (Signed)
Patient called requesting a refill on her tramadol and megace Can these be refilled

## 2015-07-20 MED ORDER — TRAMADOL HCL 50 MG PO TABS: 50.0000 mg | ORAL_TABLET | Freq: Three times a day (TID) | ORAL | Status: DC | PRN

## 2015-07-20 NOTE — Telephone Encounter (Signed)
Patient is calling to check on the status of this refill

## 2015-07-20 NOTE — Telephone Encounter (Signed)
Refilled tramadol ready for pick up   Will need OV to discuss megace as megace is usually used in patient with uncontrolled weight loss to stimulate appetite and promote weight gain

## 2015-07-25 ENCOUNTER — Ambulatory Visit: Payer: Self-pay | Attending: Family Medicine | Admitting: Family Medicine

## 2015-07-25 ENCOUNTER — Encounter: Payer: Self-pay | Admitting: Family Medicine

## 2015-07-25 VITALS — BP 108/74 | HR 79 | Temp 98.1°F | Resp 16 | Ht 63.5 in | Wt 306.0 lb

## 2015-07-25 DIAGNOSIS — E1142 Type 2 diabetes mellitus with diabetic polyneuropathy: Secondary | ICD-10-CM

## 2015-07-25 DIAGNOSIS — E1140 Type 2 diabetes mellitus with diabetic neuropathy, unspecified: Secondary | ICD-10-CM | POA: Insufficient documentation

## 2015-07-25 DIAGNOSIS — Z23 Encounter for immunization: Secondary | ICD-10-CM

## 2015-07-25 DIAGNOSIS — Z0000 Encounter for general adult medical examination without abnormal findings: Secondary | ICD-10-CM

## 2015-07-25 DIAGNOSIS — B351 Tinea unguium: Secondary | ICD-10-CM

## 2015-07-25 DIAGNOSIS — N938 Other specified abnormal uterine and vaginal bleeding: Secondary | ICD-10-CM

## 2015-07-25 DIAGNOSIS — D509 Iron deficiency anemia, unspecified: Secondary | ICD-10-CM

## 2015-07-25 LAB — POCT GLYCOSYLATED HEMOGLOBIN (HGB A1C): HEMOGLOBIN A1C: 5.6

## 2015-07-25 LAB — GLUCOSE, POCT (MANUAL RESULT ENTRY): POC GLUCOSE: 152 mg/dL — AB (ref 70–99)

## 2015-07-25 MED ORDER — GABAPENTIN 100 MG PO CAPS: 100.0000 mg | ORAL_CAPSULE | Freq: Every day | ORAL | Status: DC

## 2015-07-25 MED ORDER — TERBINAFINE HCL 250 MG PO TABS: 250.0000 mg | ORAL_TABLET | Freq: Every day | ORAL | Status: DC

## 2015-07-25 MED ORDER — MEGESTROL ACETATE 40 MG PO TABS: 40.0000 mg | ORAL_TABLET | Freq: Two times a day (BID) | ORAL | Status: DC

## 2015-07-25 NOTE — Assessment & Plan Note (Signed)
A: diabetic neuropathy. Well controlled CBGs P: gabapentin ordered

## 2015-07-25 NOTE — Assessment & Plan Note (Signed)
A: diabetes well controlled P:  Continue current regimen

## 2015-07-25 NOTE — Progress Notes (Signed)
Patient ID: Hannah Baldwin, female   DOB: [birth year], 44 y.o.   MRN: [id number]   Subjective:  Patient ID: Hannah Baldwin, female    DOB: 09-27-1971  Age: 44 y.o. MRN: [id number]  CC: Diabetes   HPI MARIECLAIRE BETTENHAUSEN presents for   1. CHRONIC DIABETES dx in 01/2015   Disease Monitoring  Blood Sugar Ranges: < 160  Polyuria: no   Visual problems: no   Medication Compliance: yes  Medication Side Effects  Hypoglycemia: no   Preventitive Health Care  Eye Exam: due   Foot Exam: done today     2. Foot pains: at night. L foot. Plantar surface. No lesions. Dry skin. Thick toenails.  3. DUB: requesting megace refill. No bleeding currently. Followed by OB/gyn. Plan for IUD placement when she saves $125.00.   Social History  Substance Use Topics  . Smoking status: Current Some Day Smoker -- 0.25 packs/day for 20 years    Types: Cigarettes    Last Attempt to Quit: 01/18/2015  . Smokeless tobacco: Never Used  . Alcohol Use: Yes     Comment: 01/20/2015 "dank some alcohol on my birthday, cookouts,  etc,;  maybe once/month"   Past Medical History  Diagnosis Date  . Asthma Dx 2014  . Pneumonia ~ 2000 X 1  . Anemia   . 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 Diabetes mellitus without complication [facility])     Past Surgical History  Procedure Laterality Date  . Cesarean section  1992    Family History  Problem Relation Age of Onset  . Asthma Mother   . Asthma Daughter     Social History  Substance Use Topics  . Smoking status: Current Some Day Smoker -- 0.25 packs/day for 20 years    Types: Cigarettes    Last Attempt to Quit: 01/18/2015  . Smokeless tobacco: Never Used  . Alcohol Use: Yes     Comment: 01/20/2015 "dank some alcohol on my birthday, cookouts,  etc,;  maybe once/month"    ROS Review of Systems  Constitutional: Negative for fever and chills.  Eyes: Negative for visual disturbance.  Respiratory: Negative for shortness of breath.     Cardiovascular: Negative for chest pain.  Gastrointestinal: Negative for abdominal pain and blood in stool.  Musculoskeletal: Negative for back pain and arthralgias.  Skin: Negative for rash.  Allergic/Immunologic: Negative for immunocompromised state.  Hematological: Negative for adenopathy. Does not bruise/bleed easily.  Psychiatric/Behavioral: Negative for suicidal ideas and dysphoric mood.    Objective:   Today's Vitals: BP 108/74 mmHg  Pulse 79  Temp(Src) 98.1 F (36.7 C) (Oral)  Resp 16  Ht 5' 3.5" (1.613 m)  Wt 306 lb (138.801 kg)  BMI 53.35 kg/m2  SpO2 100%  LMP  (LMP Unknown)  Physical Exam  Constitutional: She is oriented to person, place, and time. She appears well-developed and well-nourished. No distress.  HENT:  Head: Normocephalic and atraumatic.  Cardiovascular: Normal rate, regular rhythm, normal heart sounds and intact distal pulses.   Pulmonary/Chest: Effort normal and breath sounds normal.  Musculoskeletal: She exhibits no edema.  Neurological: She is alert and oriented to person, place, and time.  Skin: Skin is warm and dry. No rash noted.  Dry scaly skin on feet Thickened and yellowed toenails   Psychiatric: She has a normal mood and affect.    Lab Results  Component Value Date   HGBA1C 5.60 07/25/2015  CBG  152  Assessment & Plan:   Diabetes type 2, controlled ([place]) A: diabetes well controlled P:  Continue current regimen   Onychomycosis of toenail A; toenail fungus P: lamisil   Diabetic neuropathy (HCC) A: diabetic neuropathy. Well controlled CBGs P: gabapentin ordered     Outpatient Encounter Prescriptions as of 07/25/2015  Medication Sig  . albuterol (PROVENTIL HFA;VENTOLIN HFA) 108 (90 BASE) MCG/ACT inhaler Inhale 2 puffs into the lungs every 6 (six) hours as needed for wheezing or shortness of breath.  Marland Kitchen albuterol (PROVENTIL) (2.5 MG/3ML) 0.083% nebulizer solution Take 2.5 mg by nebulization every 6 (six) hours as needed for  wheezing or shortness of breath.  . blood glucose meter kit and supplies KIT Dispense based on patient and insurance preference. Use up to four times daily as directed. (FOR ICD-9 250.00, 250.01).  . cetirizine (ZYRTEC) 10 MG tablet Take 1 tablet (10 mg total) by mouth daily.  . clotrimazole-betamethasone (LOTRISONE) cream Apply 1 application topically 2 (two) times daily.  . diphenhydramine-acetaminophen (TYLENOL PM) 25-500 MG TABS Take 2 tablets by mouth at bedtime as needed (for sleep).   . ferrous sulfate 325 (65 FE) MG tablet Take 1 tablet (325 mg total) by mouth 2 (two) times daily with a meal.  . glucose blood (CHOICE DM FORA G20 TEST STRIPS) test strip Use as instructed  . glucose blood (TRUETEST TEST) test strip Check blood sugar TID & QHS  . ibuprofen (ADVIL,MOTRIN) 800 MG tablet Take 1 tablet (800 mg total) by mouth every 6 (six) hours as needed for headache or mild pain.  . Lancets (FREESTYLE) lancets Use as instructed  . metFORMIN (GLUCOPHAGE) 500 MG tablet TAKE 1 TABLET BY MOUTH 2 TIMES DAILY WITH A MEAL.  . mometasone-formoterol (DULERA) 100-5 MCG/ACT AERO Inhale 2 puffs into the lungs 2 (two) times daily. (Patient taking differently: Inhale 2 puffs into the lungs 2 (two) times daily as needed for shortness of breath. )  . traMADol (ULTRAM) 50 MG tablet Take 1 tablet (50 mg total) by mouth every 8 (eight) hours as needed for moderate pain.  . TRUEPLUS LANCETS 28G MISC Check blood sugar TID & QHS  . [DISCONTINUED] megestrol (MEGACE) 40 MG tablet Take 2 tablets (80 mg total) by mouth 2 (two) times daily.  Marland Kitchen gabapentin (NEURONTIN) 100 MG capsule Take 1-3 capsules (100-300 mg total) by mouth at bedtime.  . megestrol (MEGACE) 40 MG tablet Take 1 tablet (40 mg total) by mouth 2 (two) times daily.  Marland Kitchen terbinafine (LAMISIL) 250 MG tablet Take 1 tablet (250 mg total) by mouth daily.  . [DISCONTINUED] megestrol (MEGACE) 40 MG tablet Take 1 tablet (40 mg total) by mouth 2 (two) times daily.  (Patient not taking: Reported on 07/25/2015)  . [DISCONTINUED] methylPREDNISolone (MEDROL DOSEPAK) 4 MG TBPK tablet follow package directions (Patient not taking: Reported on 07/25/2015)  . [DISCONTINUED] nicotine (NICODERM CQ - DOSED IN MG/24 HOURS) 14 mg/24hr patch Place 1 patch (14 mg total) onto the skin daily. (Patient not taking: Reported on 07/25/2015)  . [DISCONTINUED] oxyCODONE-acetaminophen (PERCOCET/ROXICET) 5-325 MG per tablet Take 1 tablet by mouth every 6 (six) hours as needed for severe pain. (Patient not taking: Reported on 07/25/2015)   No facility-administered encounter medications on file as of 07/25/2015.    Follow-up: No Follow-up on file.    Boykin Nearing MD

## 2015-07-25 NOTE — Progress Notes (Signed)
F/U Pre-diabetic medicine refills  Stated sharp pain on Lt foot, on and off  No Hx tobacco  No pain today

## 2015-07-25 NOTE — Patient Instructions (Addendum)
Hannah Baldwin was seen today for diabetes.  Diagnoses and all orders for this visit:  Controlled type 2 diabetes mellitus with diabetic polyneuropathy, without long-term current use of insulin (HCC) -     POCT glucose (manual entry) -     POCT glycosylated hemoglobin (Hb A1C) -     Microalbumin/Creatinine Ratio, Urine -     gabapentin (NEURONTIN) 100 MG capsule; Take 1-3 capsules (100-300 mg total) by mouth at bedtime.  DUB (dysfunctional uterine bleeding) -     megestrol (MEGACE) 40 MG tablet; Take 1 tablet (40 mg total) by mouth 2 (two) times daily.  Onychomycosis of toenail -     terbinafine (LAMISIL) 250 MG tablet; Take 1 tablet (250 mg total) by mouth daily. -     COMPLETE METABOLIC PANEL WITH GFR; Future  Healthcare maintenance -     Pneumococcal polysaccharide vaccine 23-valent greater than or equal to 2yo subcutaneous/IM  Hypochromic microcytic anemia -     CBC; Future   F/u in 6 weeks for labs CMP, CBC  F/u in 3-4 months for diabetes   Dr. Adrian Blackwater

## 2015-07-25 NOTE — Assessment & Plan Note (Signed)
A; toenail fungus P: lamisil

## 2015-07-26 LAB — MICROALBUMIN / CREATININE URINE RATIO
Creatinine, Urine: 255 mg/dL (ref 20–320)
MICROALB UR: 0.8 mg/dL
MICROALB/CREAT RATIO: 3 ug/mg{creat} (ref <30)

## 2015-07-27 ENCOUNTER — Telehealth: Payer: Self-pay | Admitting: *Deleted

## 2015-07-27 NOTE — Telephone Encounter (Signed)
-----   Message from Boykin Nearing, MD sent at 07/26/2015  1:52 PM EDT ----- Normal urine microalbumin

## 2015-07-27 NOTE — Telephone Encounter (Signed)
Date of birth verified by pt  Normal microalbumin  Pt verbalized understanding

## 2015-09-05 ENCOUNTER — Ambulatory Visit: Payer: Self-pay | Attending: Family Medicine

## 2015-09-05 DIAGNOSIS — B351 Tinea unguium: Secondary | ICD-10-CM

## 2015-09-05 DIAGNOSIS — D509 Iron deficiency anemia, unspecified: Secondary | ICD-10-CM

## 2015-09-05 LAB — COMPLETE METABOLIC PANEL WITH GFR
ALBUMIN: 3.3 g/dL — AB (ref 3.6–5.1)
ALK PHOS: 66 U/L (ref 33–115)
ALT: 10 U/L (ref 6–29)
AST: 9 U/L — ABNORMAL LOW (ref 10–30)
BILIRUBIN TOTAL: 0.2 mg/dL (ref 0.2–1.2)
BUN: 14 mg/dL (ref 7–25)
CO2: 25 mmol/L (ref 20–31)
Calcium: 9.1 mg/dL (ref 8.6–10.2)
Chloride: 105 mmol/L (ref 98–110)
Creat: 0.98 mg/dL (ref 0.50–1.10)
GFR, EST NON AFRICAN AMERICAN: 71 mL/min (ref >=60)
GFR, Est African American: 82 mL/min (ref >=60)
Glucose, Bld: 89 mg/dL (ref 65–99)
Potassium: 4.3 mmol/L (ref 3.5–5.3)
Sodium: 138 mmol/L (ref 135–146)
TOTAL PROTEIN: 6.2 g/dL (ref 6.1–8.1)

## 2015-09-05 LAB — CBC
HCT: 31.4 % — ABNORMAL LOW (ref 36.0–46.0)
Hemoglobin: 9.2 g/dL — ABNORMAL LOW (ref 12.0–15.0)
MCH: 20.2 pg — ABNORMAL LOW (ref 26.0–34.0)
MCHC: 29.3 g/dL — AB (ref 30.0–36.0)
MCV: 68.9 fL — ABNORMAL LOW (ref 78.0–100.0)
MPV: 8.8 fL (ref 8.6–12.4)
Platelets: 587 10*3/uL — ABNORMAL HIGH (ref 150–400)
RBC: 4.56 MIL/uL (ref 3.87–5.11)
RDW: 18.8 % — ABNORMAL HIGH (ref 11.5–15.5)
WBC: 8.1 10*3/uL (ref 4.0–10.5)

## 2015-09-07 ENCOUNTER — Telehealth: Payer: Self-pay | Admitting: *Deleted

## 2015-09-07 NOTE — Telephone Encounter (Signed)
-----   Message from Boykin Nearing, MD sent at 09/06/2015  2:10 PM EST ----- Liver function test normal Hgb improve to 9.2 continue iron

## 2015-09-07 NOTE — Telephone Encounter (Signed)
Date of birth verified by pt  Lab results given  Liver function test normal  Hgb improving continue with iron  Pt verbalized understanding

## 2015-09-23 ENCOUNTER — Other Ambulatory Visit: Payer: Self-pay | Admitting: Pharmacist

## 2015-09-23 MED ORDER — GLUCOSE BLOOD VI STRP: ORAL_STRIP | Status: DC

## 2015-09-23 MED ORDER — TRUE METRIX METER W/DEVICE KIT: PACK | Status: DC

## 2015-10-11 MED FILL — ?METFORMIN HCL 500MG TABLET: 500 | 30 days supply | Qty: 60 | Fill #1

## 2015-10-18 ENCOUNTER — Ambulatory Visit: Payer: Self-pay | Admitting: Family Medicine

## 2015-10-31 ENCOUNTER — Encounter (HOSPITAL_COMMUNITY): Payer: Self-pay | Admitting: *Deleted

## 2015-10-31 ENCOUNTER — Inpatient Hospital Stay (HOSPITAL_COMMUNITY)
Admission: AD | Admit: 2015-10-31 | Discharge: 2015-10-31 | Disposition: A | Discharge status: Home / Self Care | Payer: BLUE CROSS/BLUE SHIELD | Source: Ambulatory Visit | Attending: Family Medicine | Admitting: Family Medicine

## 2015-10-31 DIAGNOSIS — Z87891 Personal history of nicotine dependence: Secondary | ICD-10-CM | POA: Diagnosis not present

## 2015-10-31 DIAGNOSIS — N938 Other specified abnormal uterine and vaginal bleeding: Secondary | ICD-10-CM | POA: Diagnosis present

## 2015-10-31 DIAGNOSIS — D509 Iron deficiency anemia, unspecified: Secondary | ICD-10-CM

## 2015-10-31 DIAGNOSIS — N939 Abnormal uterine and vaginal bleeding, unspecified: Secondary | ICD-10-CM | POA: Diagnosis not present

## 2015-10-31 LAB — URINE MICROSCOPIC-ADD ON: Bacteria, UA: NONE SEEN

## 2015-10-31 LAB — CBC
HEMATOCRIT: 31.9 % — AB (ref 36.0–46.0)
Hemoglobin: 9.4 g/dL — ABNORMAL LOW (ref 12.0–15.0)
MCH: 20.9 pg — ABNORMAL LOW (ref 26.0–34.0)
MCHC: 29.5 g/dL — AB (ref 30.0–36.0)
MCV: 71 fL — ABNORMAL LOW (ref 78.0–100.0)
Platelets: 552 10*3/uL — ABNORMAL HIGH (ref 150–400)
RBC: 4.49 MIL/uL (ref 3.87–5.11)
RDW: 19.3 % — ABNORMAL HIGH (ref 11.5–15.5)
WBC: 8.8 10*3/uL (ref 4.0–10.5)

## 2015-10-31 LAB — URINALYSIS, ROUTINE W REFLEX MICROSCOPIC
Bilirubin Urine: NEGATIVE
GLUCOSE, UA: NEGATIVE mg/dL
KETONES UR: 15 mg/dL — AB
LEUKOCYTES UA: NEGATIVE
Nitrite: NEGATIVE
PROTEIN: NEGATIVE mg/dL
Specific Gravity, Urine: 1.025 (ref 1.005–1.030)
pH: 5.5 (ref 5.0–8.0)

## 2015-10-31 LAB — POCT PREGNANCY, URINE: PREG TEST UR: NEGATIVE

## 2015-10-31 MED ORDER — IBUPROFEN 600 MG PO TABS: 600.0000 mg | ORAL_TABLET | Freq: Four times a day (QID) | ORAL | Status: DC | PRN

## 2015-10-31 MED ORDER — MEGESTROL ACETATE 40 MG PO TABS: 40.0000 mg | ORAL_TABLET | Freq: Two times a day (BID) | ORAL | Status: DC

## 2015-10-31 MED ORDER — FERROUS SULFATE 325 (65 FE) MG PO TABS: 325.0000 mg | ORAL_TABLET | Freq: Two times a day (BID) | ORAL | Status: DC

## 2015-10-31 MED ORDER — TRAMADOL HCL 50 MG PO TABS: 50.0000 mg | ORAL_TABLET | Freq: Four times a day (QID) | ORAL | Status: DC | PRN

## 2015-10-31 MED ORDER — KETOROLAC TROMETHAMINE 60 MG/2ML IM SOLN
60.0000 mg | Freq: Once | INTRAMUSCULAR | Status: AC
  Administered 2015-10-31: 60 mg via INTRAMUSCULAR
  Filled 2015-10-31: qty 2

## 2015-10-31 MED FILL — IBUPROFEN 600 MG TABLET: 600 | 8 days supply | Qty: 30 | Fill #0

## 2015-10-31 MED FILL — FERROUS SULFATE 325 MG TAB: 325 (65 FE) | 30 days supply | Qty: 60 | Fill #0

## 2015-10-31 MED FILL — MEGESTROL 40 MG TABLET: 40 | 30 days supply | Qty: 60 | Fill #0

## 2015-10-31 NOTE — MAU Note (Signed)
Last time she went to the dr, she was told she was anemic and was told to go back on the iron pills.  Stopped taking  Them because they made her sick.  Has been feeling really tired and getting short of breath again.

## 2015-10-31 NOTE — MAU Provider Note (Signed)
History     CSN: [id number]  Arrival date and time: 10/31/15 9458   First Provider Initiated Contact with Patient 10/31/15 1002      No chief complaint on file.  HPI  Hannah Baldwin is a 45 year old G2P2, not currently pregnant, with PMHx of asthma, COPD, DM2, diabetic neuropathy, DUB who presents with abdominal pain and heavy bleeding starting last evening.  Patient states she was in her usual state of health yesterday when she suddenly felt blood "gushing." She then began having severe cramping and aching that is continued today.  Rates her pain as 10/10 in her pelvic region, bilaterally.  Patient has a history of DUB for which she has taken Megace in the past. She states she did not have any Megace or pain medication at home prompting her to present to MAU for evaluation.   Patients for the past several days she has been short of breath.  She has a history of asthma as well as hypochromic anemia.  Patient states she has not been taking her iron supplements because they cause stomach pain.  She was using her nebulizer and Albuterol inhaler for a cold 3 weeks ago with relief of her wheezing, but it has not helped her current shortness of breath. Patient denies chest pain, dizziness, headache, dysuria, urinary frequency and urgency, or abnormal discharge.   Patient states she has an appt with Family Practice this Thursday 11/03/15.  OB History    Gravida Para Term Preterm AB TAB SAB Ectopic Multiple Living   '2 2 0 0 0 0 0 0 0 2 '      Past Medical History  Diagnosis Date  . Asthma Dx 2014  . Pneumonia ~ 2000 X 1  . Anemia   . 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 Diabetes mellitus without complication [facility])     Past Surgical History  Procedure Laterality Date  . Cesarean section  1992    Family History  Problem Relation Age of Onset  . Asthma Mother   . Asthma Daughter     Social History  Substance Use Topics  . Smoking status: Former Smoker --  0.25 packs/day for 20 years    Types: Cigarettes    Quit date: 01/18/2015  . Smokeless tobacco: Never Used  . Alcohol Use: Yes     Comment: 01/20/2015 "dank some alcohol on my birthday, cookouts,  etc,;  maybe once/month"    Allergies: No Known Allergies  Prescriptions prior to admission  Medication Sig Dispense Refill Last Dose  . albuterol (PROVENTIL HFA;VENTOLIN HFA) 108 (90 BASE) MCG/ACT inhaler Inhale 2 puffs into the lungs every 6 (six) hours as needed for wheezing or shortness of breath. 3 Inhaler 3 Taking  . albuterol (PROVENTIL) (2.5 MG/3ML) 0.083% nebulizer solution Take 2.5 mg by nebulization every 6 (six) hours as needed for wheezing or shortness of breath.   Taking  . blood glucose meter kit and supplies KIT Dispense based on patient and insurance preference. Use up to four times daily as directed. (FOR ICD-9 250.00, 250.01). 1 each 0 Taking  . Blood Glucose Monitoring Suppl (TRUE METRIX METER) W/DEVICE KIT USE AS INSTRUCTED 1 kit 0   . cetirizine (ZYRTEC) 10 MG tablet Take 1 tablet (10 mg total) by mouth daily. 30 tablet 11 Taking  . clotrimazole-betamethasone (LOTRISONE) cream Apply 1 application topically 2 (two) times daily. 30 g 1 Taking  . diphenhydramine-acetaminophen (TYLENOL PM) 25-500  MG TABS Take 2 tablets by mouth at bedtime as needed (for sleep).    Taking  . gabapentin (NEURONTIN) 100 MG capsule Take 1-3 capsules (100-300 mg total) by mouth at bedtime. 90 capsule 0   . glucose blood (TRUE METRIX BLOOD GLUCOSE TEST) test strip Use as instructed 100 each 12   . Lancets (FREESTYLE) lancets Use as instructed 100 each 12 Taking  . megestrol (MEGACE) 40 MG tablet Take 1 tablet (40 mg total) by mouth 2 (two) times daily. 60 tablet 5   . metFORMIN (GLUCOPHAGE) 500 MG tablet TAKE 1 TABLET BY MOUTH 2 TIMES DAILY WITH A MEAL. 60 tablet 3 Taking  . mometasone-formoterol (DULERA) 100-5 MCG/ACT AERO Inhale 2 puffs into the lungs 2 (two) times daily. (Patient taking differently:  Inhale 2 puffs into the lungs 2 (two) times daily as needed for shortness of breath. ) 1 Inhaler 3 Taking  . terbinafine (LAMISIL) 250 MG tablet Take 1 tablet (250 mg total) by mouth daily. 30 tablet 2   . traMADol (ULTRAM) 50 MG tablet Take 1 tablet (50 mg total) by mouth every 8 (eight) hours as needed for moderate pain. 60 tablet 0 Taking  . TRUEPLUS LANCETS 28G MISC Check blood sugar TID & QHS 100 each 2 Taking  . [DISCONTINUED] ferrous sulfate 325 (65 FE) MG tablet Take 1 tablet (325 mg total) by mouth 2 (two) times daily with a meal. 60 tablet 11 Taking  . [DISCONTINUED] ibuprofen (ADVIL,MOTRIN) 800 MG tablet Take 1 tablet (800 mg total) by mouth every 6 (six) hours as needed for headache or mild pain. 60 tablet 1 Taking   Results for orders placed or performed during the hospital encounter of 10/31/15 (from the past 24 hour(s))  CBC     Status: Abnormal   Collection Time: 10/31/15  9:30 AM  Result Value Ref Range   WBC 8.8 4.0 - 10.5 K/uL   RBC 4.49 3.87 - 5.11 MIL/uL   Hemoglobin 9.4 (L) 12.0 - 15.0 g/dL   HCT 31.9 (L) 36.0 - 46.0 %   MCV 71.0 (L) 78.0 - 100.0 fL   MCH 20.9 (L) 26.0 - 34.0 pg   MCHC 29.5 (L) 30.0 - 36.0 g/dL   RDW 19.3 (H) 11.5 - 15.5 %   Platelets 552 (H) 150 - 400 K/uL  Urinalysis, Routine w reflex microscopic (not at [facility])     Status: Abnormal   Collection Time: 10/31/15 10:46 AM  Result Value Ref Range   Color, Urine YELLOW YELLOW   APPearance CLEAR CLEAR   Specific Gravity, Urine 1.025 1.005 - 1.030   pH 5.5 5.0 - 8.0   Glucose, UA NEGATIVE NEGATIVE mg/dL   Hgb urine dipstick LARGE (A) NEGATIVE   Bilirubin Urine NEGATIVE NEGATIVE   Ketones, ur 15 (A) NEGATIVE mg/dL   Protein, ur NEGATIVE NEGATIVE mg/dL   Nitrite NEGATIVE NEGATIVE   Leukocytes, UA NEGATIVE NEGATIVE  Urine microscopic-add on     Status: Abnormal   Collection Time: 10/31/15 10:46 AM  Result Value Ref Range   Squamous Epithelial / LPF 0-5 (A) NONE SEEN   WBC, UA 0-5 0 - 5 WBC/hpf   RBC  / HPF 0-5 0 - 5 RBC/hpf   Bacteria, UA NONE SEEN NONE SEEN  Pregnancy, urine POC     Status: None   Collection Time: 10/31/15 10:59 AM  Result Value Ref Range   Preg Test, Ur NEGATIVE NEGATIVE     ROS Physical Exam   Blood pressure 135/98,  pulse 91, temperature 98 F (36.7 C), temperature source Oral, resp. rate 20, last menstrual period 10/30/2015, SpO2 97 %.  Physical Exam  Nursing note and vitals reviewed. Constitutional: She is oriented to person, place, and time. She appears well-developed and well-nourished. No distress.  HENT:  Head: Normocephalic and atraumatic.  Cardiovascular: Normal rate, regular rhythm and normal heart sounds.   No murmur heard. Respiratory: Effort normal and breath sounds normal. No respiratory distress. She has no wheezes. She has no rales.  GI: Soft. She exhibits no distension. There is tenderness (in pelvic region, no change in pain with light or deep palpation). There is no rebound.  Genitourinary:  Speculum exam: Vagina - 1/2 Foxswab soaked with dark red blood, no odor Cervix - + active bleeding  Bimanual exam: Uterus non tender, normal size Adnexa non tender, no masses bilaterally Chaperone present for exam.   Neurological: She is alert and oriented to person, place, and time.  Skin: Skin is warm and dry.  Psychiatric: She has a normal mood and affect. Her behavior is normal.    MAU Course  Procedures  None  MDM Patient presents with acute onset of heavy bleeding.  She has a history of DUB for which she has taken Megace; did not have any medication at home.  Long-term plan per patient, confirmed via chart review, is an IUD.  May also need endometrial biopsy given US findings of 69m uterine stripe. Patient has not yet had an IUD placed, plans for placement in July 2016, but patient did not keep appointment. Patient has non-compliance   Obtain CBC and UA.  Hemoglobin stable at 9.4 (9.2 in Nov 2016) Note BP of 135/98 on presentation to  MAU. Patient was informed and has an appt scheduled with her primary care provider this Thursday.    Assessment and Plan   A:Dysfunction uterine bleeding  P: Megace 483mBID for bleeding Tramadol [id number] for pain #6 no refill. Patient to discuss pain management with PCP on Thurs.  Follow-up with PCP as scheduled this Thursday Follow-up ultrasound scheduled  [facility] [place] discuss IUD placement and possible endometrial biopsy Bleeding precautions   JenLezlie LyeP 10/31/2015 4:23 PM   PA student attestation:  I have seen and examined this patient; I agree with above documentation in the PA students note.  Hannah Baldwin a 44 9o. G2P0002 reporting heavy vaginal bleeding.    PE: BP 136/88 mmHg  Pulse 86  Temp(Src) 97.7 F (36.5 C) (Oral)  Resp 20  SpO2 97%  LMP 10/30/2015 Gen: calm comfortable, NAD Resp: normal effort, no distress  ROS, labs, PMH reviewed   Plan: As stated above.    JenLezlie LyeP 10/31/2015 4:28 PM

## 2015-10-31 NOTE — Discharge Instructions (Signed)
Abnormal Uterine Bleeding Abnormal uterine bleeding can affect women at various stages in life, including teenagers, women in their reproductive years, pregnant women, and women who have reached menopause. Several kinds of uterine bleeding are considered abnormal, including:  Bleeding or spotting between periods.   Bleeding after sexual intercourse.   Bleeding that is heavier or more than normal.   Periods that last longer than usual.  Bleeding after menopause.  Many cases of abnormal uterine bleeding are minor and simple to treat, while others are more serious. Any type of abnormal bleeding should be evaluated by your health care provider. Treatment will depend on the cause of the bleeding. HOME CARE INSTRUCTIONS Monitor your condition for any changes. The following actions may help to alleviate any discomfort you are experiencing:  Avoid the use of tampons and douches as directed by your health care provider.  Change your pads frequently. You should get regular pelvic exams and Pap tests. Keep all follow-up appointments for diagnostic tests as directed by your health care provider.  SEEK MEDICAL CARE IF:   Your bleeding lasts more than 1 week.   You feel dizzy at times.  SEEK IMMEDIATE MEDICAL CARE IF:   You pass out.   You are changing pads every 15 to 30 minutes.   You have abdominal pain.  You have a fever.   You become sweaty or weak.   You are passing large blood clots from the vagina.   You start to feel nauseous and vomit. MAKE SURE YOU:   Understand these instructions.  Will watch your condition.  Will get help right away if you are not doing well or get worse.   This information is not intended to replace advice given to you by your health care provider. Make sure you discuss any questions you have with your health care provider.   Document Released: 09/24/2005 Document Revised: 09/29/2013 Document Reviewed: 04/23/2013 Elsevier Interactive  Patient Education 2016 [employer].  Anemia, Nonspecific Anemia is a condition in which the concentration of red blood cells or hemoglobin in the blood is below normal. Hemoglobin is a substance in red blood cells that carries oxygen to the tissues of the body. Anemia results in not enough oxygen reaching these tissues.  CAUSES  Common causes of anemia include:   Excessive bleeding. Bleeding may be internal or external. This includes excessive bleeding from periods (in women) or from the intestine.   Poor nutrition.   Chronic kidney, thyroid, and liver disease.  Bone marrow disorders that decrease red blood cell production.  Cancer and treatments for cancer.  HIV, AIDS, and their treatments.  Spleen problems that increase red blood cell destruction.  Blood disorders.  Excess destruction of red blood cells due to infection, medicines, and autoimmune disorders. SIGNS AND SYMPTOMS   Minor weakness.   Dizziness.   Headache.  Palpitations.   Shortness of breath, especially with exercise.   Paleness.  Cold sensitivity.  Indigestion.  Nausea.  Difficulty sleeping.  Difficulty concentrating. Symptoms may occur suddenly or they may develop slowly.  DIAGNOSIS  Additional blood tests are often needed. These help your health care provider determine the best treatment. Your health care provider will check your stool for blood and look for other causes of blood loss.  TREATMENT  Treatment varies depending on the cause of the anemia. Treatment can include:   Supplements of iron, vitamin U23, or folic acid.   Hormone medicines.   A blood transfusion. This may be needed if blood loss  is severe.   Hospitalization. This may be needed if there is significant continual blood loss.   Dietary changes.  Spleen removal. HOME CARE INSTRUCTIONS Keep all follow-up appointments. It often takes many weeks to correct anemia, and having your health care provider check on  your condition and your response to treatment is very important. SEEK IMMEDIATE MEDICAL CARE IF:   You develop extreme weakness, shortness of breath, or chest pain.   You become dizzy or have trouble concentrating.  You develop heavy vaginal bleeding.   You develop a rash.   You have bloody or black, tarry stools.   You faint.   You vomit up blood.   You vomit repeatedly.   You have abdominal pain.  You have a fever or persistent symptoms for more than 2-3 days.   You have a fever and your symptoms suddenly get worse.   You are dehydrated.  MAKE SURE YOU:  Understand these instructions.  Will watch your condition.  Will get help right away if you are not doing well or get worse.   This information is not intended to replace advice given to you by your health care provider. Make sure you discuss any questions you have with your health care provider.   Document Released: 11/01/2004 Document Revised: 05/27/2013 Document Reviewed: 03/20/2013 Elsevier Interactive Patient Education [employer].

## 2015-10-31 NOTE — MAU Note (Signed)
Started cramping last night, then started gushing blood.   Has been on meds to stop bleeding.   Had missed a couple pills.   Heavy last night, big clots; having a lot of pain.

## 2015-11-02 ENCOUNTER — Ambulatory Visit (HOSPITAL_COMMUNITY)
Admission: RE | Admit: 2015-11-02 | Discharge: 2015-11-02 | Disposition: A | Discharge status: Home / Self Care | Payer: BLUE CROSS/BLUE SHIELD | Source: Ambulatory Visit | Attending: Obstetrics and Gynecology | Admitting: Obstetrics and Gynecology

## 2015-11-02 DIAGNOSIS — D649 Anemia, unspecified: Secondary | ICD-10-CM | POA: Diagnosis not present

## 2015-11-02 DIAGNOSIS — N938 Other specified abnormal uterine and vaginal bleeding: Secondary | ICD-10-CM

## 2015-11-02 DIAGNOSIS — N939 Abnormal uterine and vaginal bleeding, unspecified: Secondary | ICD-10-CM | POA: Insufficient documentation

## 2015-11-02 DIAGNOSIS — D259 Leiomyoma of uterus, unspecified: Secondary | ICD-10-CM | POA: Insufficient documentation

## 2015-11-02 DIAGNOSIS — D509 Iron deficiency anemia, unspecified: Secondary | ICD-10-CM

## 2015-11-03 ENCOUNTER — Encounter: Payer: Self-pay | Admitting: Family Medicine

## 2015-11-03 ENCOUNTER — Ambulatory Visit: Payer: BLUE CROSS/BLUE SHIELD | Attending: Family Medicine | Admitting: Family Medicine

## 2015-11-03 VITALS — BP 114/78 | HR 83 | Temp 98.1°F | Resp 16 | Ht 63.5 in | Wt 307.0 lb

## 2015-11-03 DIAGNOSIS — J449 Chronic obstructive pulmonary disease, unspecified: Secondary | ICD-10-CM | POA: Diagnosis not present

## 2015-11-03 DIAGNOSIS — E1142 Type 2 diabetes mellitus with diabetic polyneuropathy: Secondary | ICD-10-CM

## 2015-11-03 DIAGNOSIS — Z8709 Personal history of other diseases of the respiratory system: Secondary | ICD-10-CM

## 2015-11-03 DIAGNOSIS — N938 Other specified abnormal uterine and vaginal bleeding: Secondary | ICD-10-CM

## 2015-11-03 DIAGNOSIS — G4700 Insomnia, unspecified: Secondary | ICD-10-CM | POA: Insufficient documentation

## 2015-11-03 DIAGNOSIS — Z7984 Long term (current) use of oral hypoglycemic drugs: Secondary | ICD-10-CM | POA: Insufficient documentation

## 2015-11-03 DIAGNOSIS — Z79899 Other long term (current) drug therapy: Secondary | ICD-10-CM | POA: Insufficient documentation

## 2015-11-03 DIAGNOSIS — J45909 Unspecified asthma, uncomplicated: Secondary | ICD-10-CM | POA: Diagnosis not present

## 2015-11-03 DIAGNOSIS — E119 Type 2 diabetes mellitus without complications: Secondary | ICD-10-CM | POA: Diagnosis not present

## 2015-11-03 DIAGNOSIS — Z87891 Personal history of nicotine dependence: Secondary | ICD-10-CM | POA: Insufficient documentation

## 2015-11-03 LAB — POCT GLYCOSYLATED HEMOGLOBIN (HGB A1C)

## 2015-11-03 LAB — GLUCOSE, POCT (MANUAL RESULT ENTRY): POC GLUCOSE: 70 mg/dL (ref 70–99)

## 2015-11-03 MED ORDER — MEGESTROL ACETATE 40 MG PO TABS: 40.0000 mg | ORAL_TABLET | Freq: Two times a day (BID) | ORAL | Status: DC

## 2015-11-03 MED ORDER — ALBUTEROL SULFATE HFA 108 (90 BASE) MCG/ACT IN AERS: 2.0000 | INHALATION_SPRAY | Freq: Four times a day (QID) | RESPIRATORY_TRACT | Status: DC | PRN

## 2015-11-03 MED ORDER — GLUCOSE BLOOD VI STRP: 1.0000 | ORAL_STRIP | Freq: Every day | Status: DC

## 2015-11-03 MED ORDER — TRAZODONE HCL 50 MG PO TABS: 25.0000 mg | ORAL_TABLET | Freq: Every evening | ORAL | Status: DC | PRN

## 2015-11-03 MED ORDER — METFORMIN HCL 500 MG PO TABS: 500.0000 mg | ORAL_TABLET | Freq: Two times a day (BID) | ORAL | Status: DC

## 2015-11-03 MED FILL — traZODone HCL 50 MG TABS: 50 | 30 days supply | Qty: 30 | Fill #0

## 2015-11-03 MED FILL — metFORMIN HCL 500 MG TABS: 500 | 30 days supply | Qty: 60 | Fill #0

## 2015-11-03 MED FILL — TRUE METRIX GLUCOSE TEST ST: 25 days supply | Qty: 100 | Fill #0

## 2015-11-03 MED FILL — VENTOLIN HFA 90 MCG INHALER: 108 (90 BAS | 25 days supply | Qty: 18 | Fill #0

## 2015-11-03 NOTE — Patient Instructions (Addendum)
Hannah Baldwin was seen today for diabetes.  Diagnoses and all orders for this visit:  Controlled type 2 diabetes mellitus with diabetic polyneuropathy, without long-term current use of insulin (HCC) -     POCT glucose (manual entry) -     POCT glycosylated hemoglobin (Hb A1C) -     metFORMIN (GLUCOPHAGE) 500 MG tablet; Take 1 tablet (500 mg total) by mouth 2 (two) times daily. -     Ambulatory referral to Ophthalmology -     glucose blood (TRUE METRIX BLOOD GLUCOSE TEST) test strip; 1 each by Other route daily. Use as instructed  DUB (dysfunctional uterine bleeding) -     megestrol (MEGACE) 40 MG tablet; Take 1-2 tablets (40-80 mg total) by mouth 2 (two) times daily.  History of asthma -     albuterol (PROVENTIL HFA;VENTOLIN HFA) 108 (90 Base) MCG/ACT inhaler; Inhale 2 puffs into the lungs every 6 (six) hours as needed for wheezing or shortness of breath.  Chronic obstructive pulmonary disease, unspecified COPD, unspecified chronic bronchitis type -     albuterol (PROVENTIL HFA;VENTOLIN HFA) 108 (90 Base) MCG/ACT inhaler; Inhale 2 puffs into the lungs every 6 (six) hours as needed for wheezing or shortness of breath.  Insomnia -     traZODone (DESYREL) 50 MG tablet; Take 0.5-1 tablets (25-50 mg total) by mouth at bedtime as needed for sleep.    F/u in 6 months for well controlled  diabetes   Dr. Adrian Blackwater

## 2015-11-03 NOTE — Progress Notes (Signed)
Subjective:  Patient ID: Hannah Baldwin, female    DOB: 15-Mar-1971  Age: 45 y.o. MRN: [id number]  CC: Diabetes   HPI Hannah Baldwin presents for    1. CHRONIC DIABETES  Disease Monitoring  Blood Sugar Ranges: not checking   Polyuria: no   Visual problems: no   Medication Compliance: yes  Medication Side Effects  Hypoglycemia: no   Preventitive Health Care  Eye Exam: due     2. Dysfunction uterine bleeding: bleeding restarted. Taking megace followed by genecology. Was taking 40 mg BID, but has increased to 80 mg BID when bleeding increased.  Planning IUD placement.  Has fatigue. No dizziness or lightheadedness.   3. Insomnia: has tried OTC sleep aid with benadryl. Has trouble falling asleep. Admits to depressed mood at times.    Social History  Substance Use Topics  . Smoking status: Former Smoker -- 0.25 packs/day for 20 years    Types: Cigarettes    Quit date: 01/18/2015  . Smokeless tobacco: Never Used  . Alcohol Use: Yes     Comment: 01/20/2015 "dank some alcohol on my birthday, cookouts,  etc,;  maybe once/month"    Outpatient Prescriptions Prior to Visit  Medication Sig Dispense Refill  . albuterol (PROVENTIL HFA;VENTOLIN HFA) 108 (90 BASE) MCG/ACT inhaler Inhale 2 puffs into the lungs every 6 (six) hours as needed for wheezing or shortness of breath. 3 Inhaler 3  . albuterol (PROVENTIL) (2.5 MG/3ML) 0.083% nebulizer solution Take 2.5 mg by nebulization every 6 (six) hours as needed for wheezing or shortness of breath.    . blood glucose meter kit and supplies KIT Dispense based on patient and insurance preference. Use up to four times daily as directed. (FOR ICD-9 250.00, 250.01). 1 each 0  . Blood Glucose Monitoring Suppl (TRUE METRIX METER) W/DEVICE KIT USE AS INSTRUCTED 1 kit 0  . diphenhydramine-acetaminophen (TYLENOL PM) 25-500 MG TABS Take 2 tablets by mouth at bedtime as needed (for sleep).     . ferrous sulfate 325 (65 FE) MG tablet Take 1 tablet (325 mg  total) by mouth 2 (two) times daily with a meal. 60 tablet 1  . gabapentin (NEURONTIN) 100 MG capsule Take 1-3 capsules (100-300 mg total) by mouth at bedtime. 90 capsule 0  . glucose blood (TRUE METRIX BLOOD GLUCOSE TEST) test strip Use as instructed 100 each 12  . ibuprofen (ADVIL,MOTRIN) 600 MG tablet Take 1 tablet (600 mg total) by mouth every 6 (six) hours as needed. 30 tablet 0  . Lancets (FREESTYLE) lancets Use as instructed 100 each 12  . megestrol (MEGACE) 40 MG tablet Take 1 tablet (40 mg total) by mouth 2 (two) times daily. 60 tablet 1  . metFORMIN (GLUCOPHAGE) 500 MG tablet TAKE 1 TABLET BY MOUTH 2 TIMES DAILY WITH A MEAL. (Patient taking differently: take 1 tablet twice a day) 60 tablet 3  . traMADol (ULTRAM) 50 MG tablet Take 1 tablet (50 mg total) by mouth every 6 (six) hours as needed. 6 tablet 0  . TRUEPLUS LANCETS 28G MISC Check blood sugar TID & QHS 100 each 2  . terbinafine (LAMISIL) 250 MG tablet Take 1 tablet (250 mg total) by mouth daily. (Patient not taking: Reported on 11/03/2015) 30 tablet 2   No facility-administered medications prior to visit.    ROS Review of Systems  Constitutional: Positive for fatigue. Negative for fever and chills.  Eyes: Negative for visual disturbance.  Respiratory: Negative for shortness of breath.   Cardiovascular: Negative  for chest pain.  Gastrointestinal: Negative for abdominal pain and blood in stool.  Genitourinary: Positive for menstrual problem.  Musculoskeletal: Negative for back pain and arthralgias.  Skin: Negative for rash.  Allergic/Immunologic: Negative for immunocompromised state.  Hematological: Negative for adenopathy. Does not bruise/bleed easily.  Psychiatric/Behavioral: Positive for sleep disturbance and dysphoric mood. Negative for suicidal ideas.    Objective:  BP 114/78 mmHg  Pulse 83  Temp(Src) 98.1 F (36.7 C) (Oral)  Resp 16  Ht 5' 3.5" (1.613 m)  Wt 307 lb (139.254 kg)  BMI 53.52 kg/m2  SpO2 100%   LMP 10/30/2015  BP/Weight 11/03/2015 10/31/2015 16/96/7893  Systolic BP 810 175 102  Diastolic BP 78 88 74  Wt. (Lbs) 307 - 306  BMI 53.52 - 53.35   Physical Exam  Constitutional: She is oriented to person, place, and time. She appears well-developed and well-nourished. No distress.  HENT:  Head: Normocephalic and atraumatic.  Cardiovascular: Normal rate, regular rhythm, normal heart sounds and intact distal pulses.   Pulmonary/Chest: Effort normal and breath sounds normal.  Musculoskeletal: She exhibits no edema.  Neurological: She is alert and oriented to person, place, and time.  Skin: Skin is warm and dry. No rash noted.  Psychiatric: She has a normal mood and affect.   Lab Results  Component Value Date   HGBA1C 5.60 07/25/2015   Lab Results  Component Value Date   HGBA1C 5 7 11/03/2015    CBG 70  Lab Results  Component Value Date   WBC 8.8 10/31/2015   HGB 9.4* 10/31/2015   HCT 31.9* 10/31/2015   MCV 71.0* 10/31/2015   PLT 552* 10/31/2015     Assessment & Plan:   Damoni was seen today for diabetes.  Diagnoses and all orders for this visit:  Controlled type 2 diabetes mellitus with diabetic polyneuropathy, without long-term current use of insulin (HCC) -     POCT glucose (manual entry) -     POCT glycosylated hemoglobin (Hb A1C) -     metFORMIN (GLUCOPHAGE) 500 MG tablet; Take 1 tablet (500 mg total) by mouth 2 (two) times daily. -     Ambulatory referral to Ophthalmology -     glucose blood (TRUE METRIX BLOOD GLUCOSE TEST) test strip; 1 each by Other route daily. Use as instructed  DUB (dysfunctional uterine bleeding) -     megestrol (MEGACE) 40 MG tablet; Take 1-2 tablets (40-80 mg total) by mouth 2 (two) times daily.  History of asthma -     albuterol (PROVENTIL HFA;VENTOLIN HFA) 108 (90 Base) MCG/ACT inhaler; Inhale 2 puffs into the lungs every 6 (six) hours as needed for wheezing or shortness of breath.  Chronic obstructive pulmonary disease,  unspecified COPD, unspecified chronic bronchitis type -     albuterol (PROVENTIL HFA;VENTOLIN HFA) 108 (90 Base) MCG/ACT inhaler; Inhale 2 puffs into the lungs every 6 (six) hours as needed for wheezing or shortness of breath.  Insomnia -     traZODone (DESYREL) 50 MG tablet; Take 0.5-1 tablets (25-50 mg total) by mouth at bedtime as needed for sleep.   Follow-up: No Follow-up on file.   Boykin Nearing MD

## 2015-11-03 NOTE — Progress Notes (Signed)
F/U DM  Medicine refills, medicine management  No pain today  Former smoker  No suicidal thoughts in the past two weeks

## 2015-11-03 NOTE — Assessment & Plan Note (Signed)
Remains controlled  Continue metformin

## 2015-11-03 NOTE — Assessment & Plan Note (Signed)
Trazodone for insomnia

## 2015-11-07 MED FILL — traMADol HCL 50 MG TABS: 50 | 1 days supply | Qty: 6 | Fill #0

## 2015-11-09 ENCOUNTER — Ambulatory Visit: Payer: BLUE CROSS/BLUE SHIELD | Attending: Family Medicine

## 2015-11-09 VITALS — BP 140/77 | HR 81 | Temp 98.0°F | Resp 18 | Ht 63.0 in | Wt 308.0 lb

## 2015-11-09 DIAGNOSIS — Z0000 Encounter for general adult medical examination without abnormal findings: Secondary | ICD-10-CM | POA: Diagnosis present

## 2015-11-09 MED FILL — MEGESTROL 40 MG TABLET: 40 | 30 days supply | Qty: 120 | Fill #0

## 2015-11-09 NOTE — Progress Notes (Signed)
Patient reports feeling good and denies fever.   Patient reports having a positive ppd test many, many years ago and had to have chest xray to prove negative results. Patient has had many ppd tests since then with negative results.

## 2015-11-09 NOTE — Patient Instructions (Signed)
PLEASE RETURN IN 2 DAYS FOR NURSE VISIT TO HAVE PPD SKIN TEST READ.

## 2015-11-11 ENCOUNTER — Ambulatory Visit: Payer: BLUE CROSS/BLUE SHIELD | Attending: Family Medicine

## 2015-11-11 DIAGNOSIS — Z7689 Persons encountering health services in other specified circumstances: Secondary | ICD-10-CM

## 2015-11-11 DIAGNOSIS — Z111 Encounter for screening for respiratory tuberculosis: Secondary | ICD-10-CM

## 2015-11-11 LAB — TB SKIN TEST
INDURATION: 0 mm
TB Skin Test: NEGATIVE

## 2015-11-14 IMAGING — CR DG CHEST 2V
2 series · 2 of 2 positions shown · non-contrast
Comparison: PA and lateral chest 06/08/2014 and 01/08/2014.

CLINICAL DATA: Shortness of breath for 2 weeks with wheezing. Chest
pain for 3 days.

EXAM:
CHEST  2 VIEW

[w chest pa]
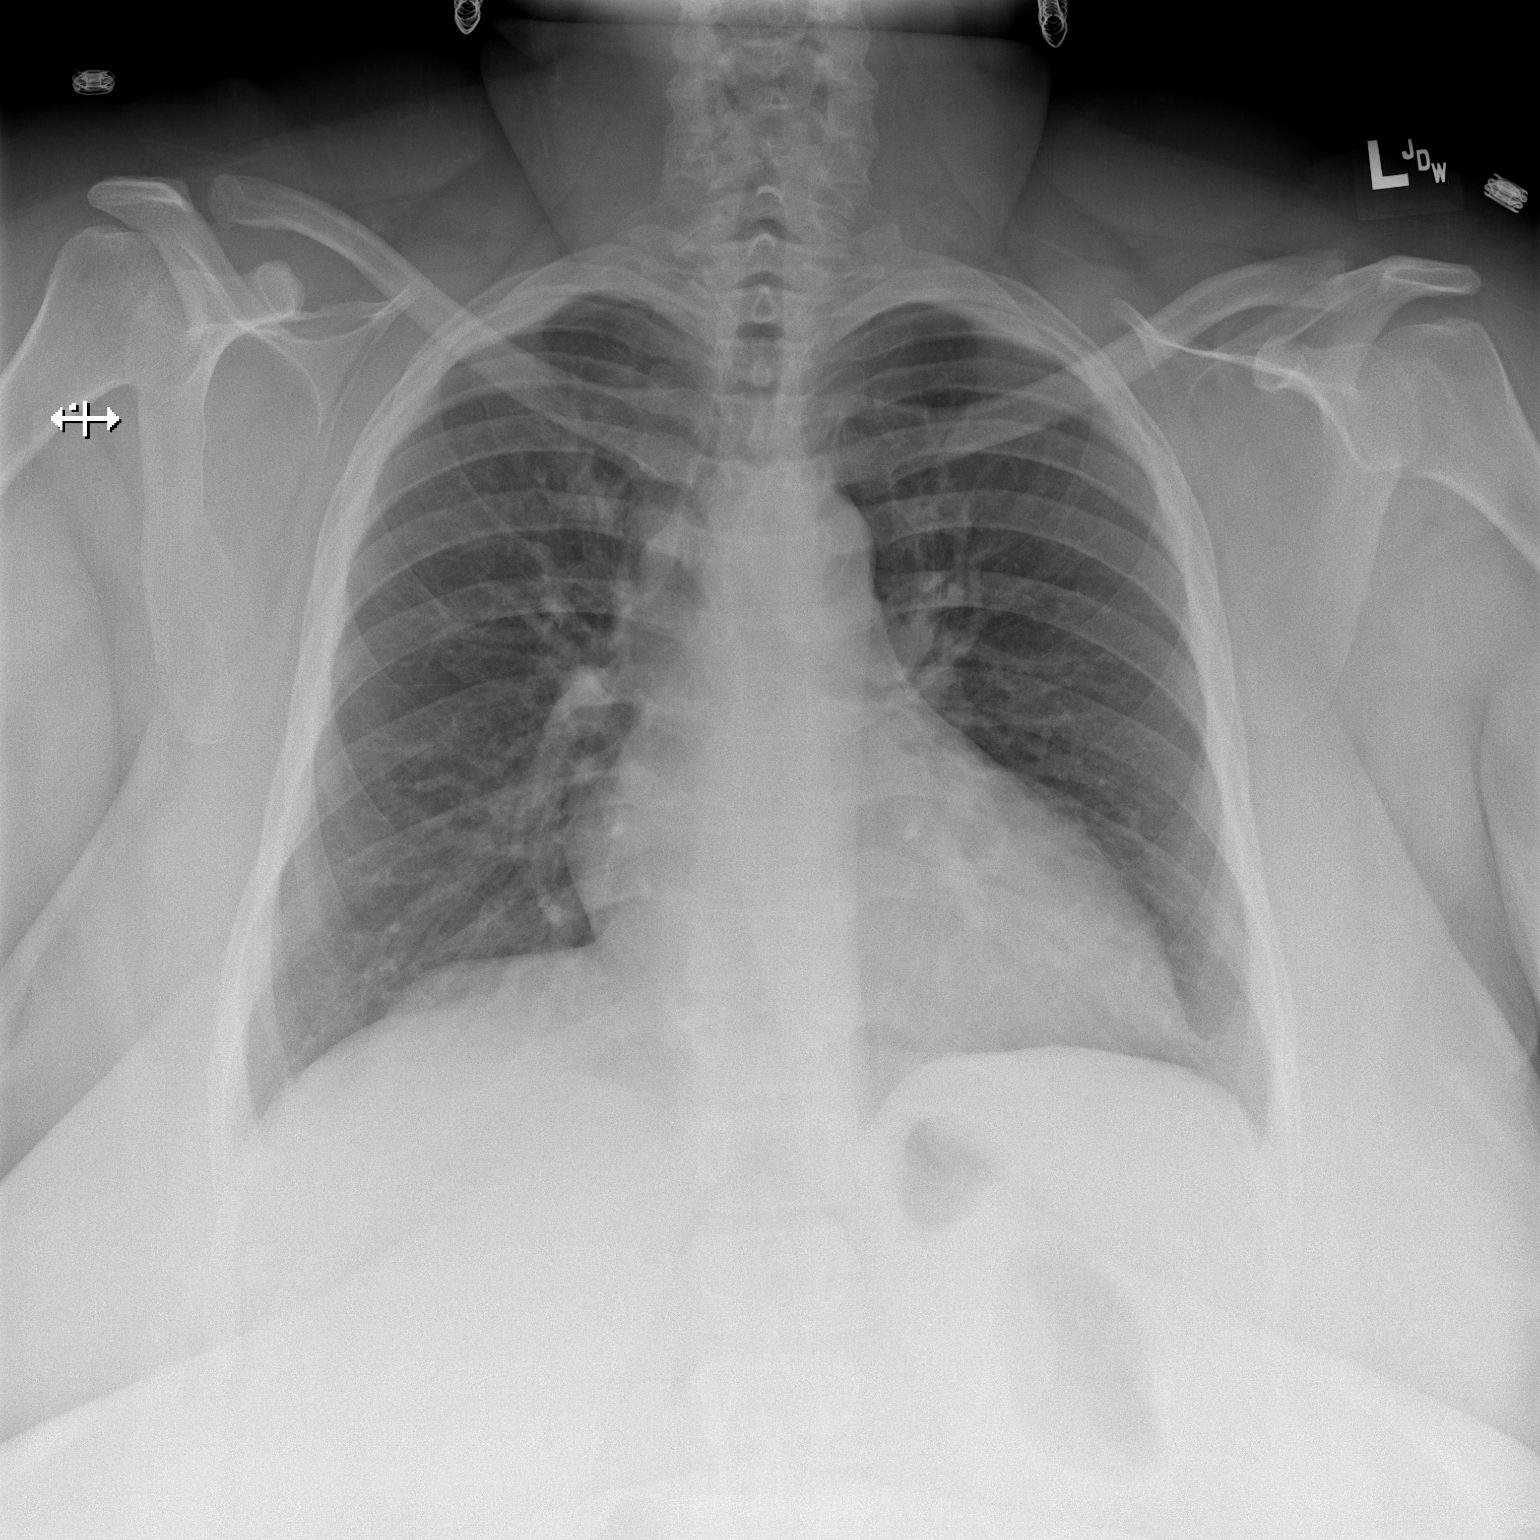

[w chest lat]
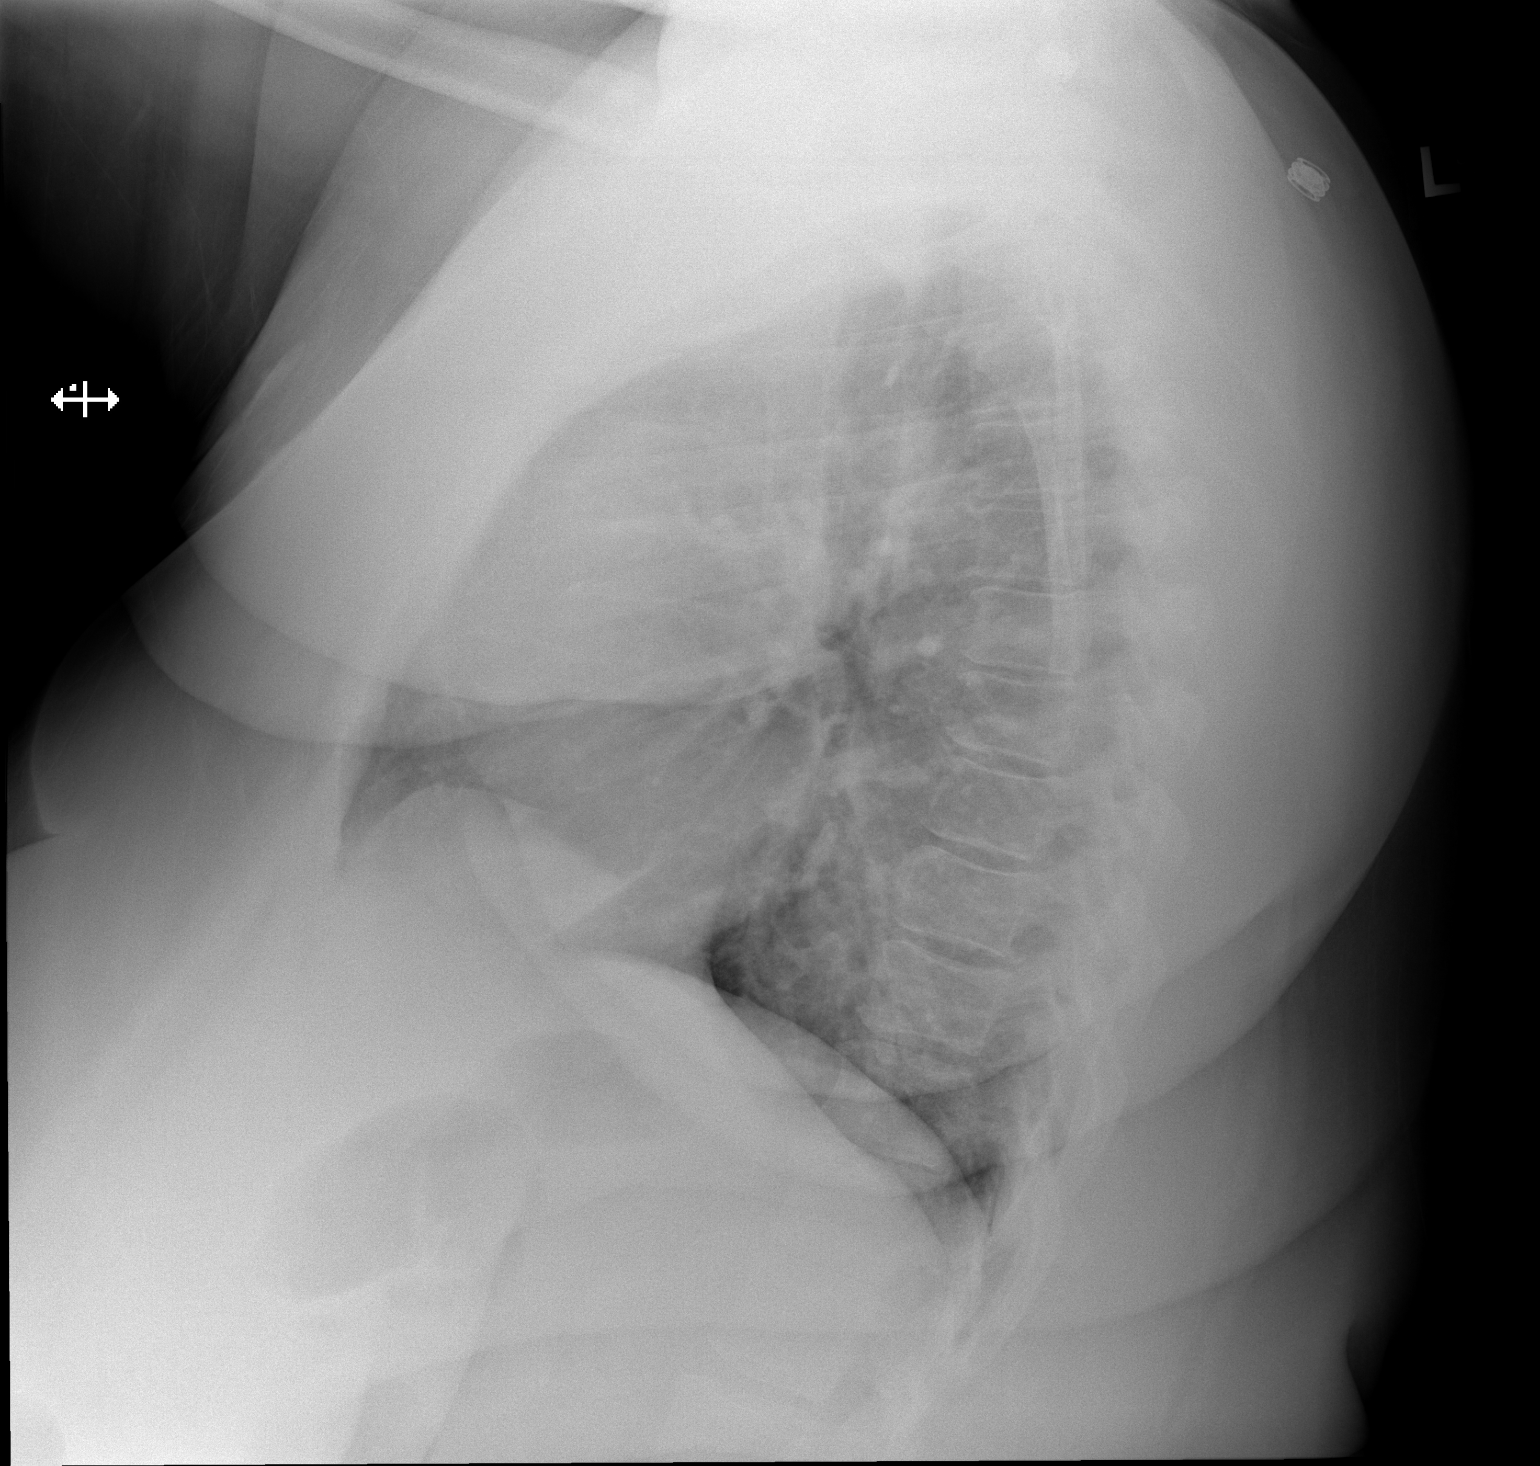

[2 of 2 positions shown; findings below may reference images not displayed]

FINDINGS: There is cardiomegaly without edema. Lungs are clear. No
pneumothorax or pleural effusion.
IMPRESSION: Cardiomegaly without acute disease.

## 2015-11-18 ENCOUNTER — Encounter: Payer: Self-pay | Admitting: Obstetrics & Gynecology

## 2015-11-18 ENCOUNTER — Ambulatory Visit (INDEPENDENT_AMBULATORY_CARE_PROVIDER_SITE_OTHER): Payer: BLUE CROSS/BLUE SHIELD | Admitting: Obstetrics & Gynecology

## 2015-11-18 DIAGNOSIS — N938 Other specified abnormal uterine and vaginal bleeding: Secondary | ICD-10-CM | POA: Diagnosis not present

## 2015-11-18 MED ORDER — IBUPROFEN 600 MG PO TABS: 600.0000 mg | ORAL_TABLET | Freq: Four times a day (QID) | ORAL | Status: DC | PRN

## 2015-11-18 NOTE — Patient Instructions (Signed)
Levonorgestrel intrauterine device (IUD) What is this medicine? LEVONORGESTREL IUD (LEE voe nor jes trel) is a contraceptive (birth control) device. The device is placed inside the uterus by a healthcare professional. It is used to prevent pregnancy and can also be used to treat heavy bleeding that occurs during your period. Depending on the device, it can be used for 3 to 5 years. This medicine may be used for other purposes; ask your health care provider or pharmacist if you have questions. What should I tell my health care provider before I take this medicine? They need to know if you have any of these conditions: -abnormal Pap smear -cancer of the breast, uterus, or cervix -diabetes -endometritis -genital or pelvic infection now or in the past -have more than one sexual partner or your partner has more than one partner -heart disease -history of an ectopic or tubal pregnancy -immune system problems -IUD in place -liver disease or tumor -problems with blood clots or take blood-thinners -use intravenous drugs -uterus of unusual shape -vaginal bleeding that has not been explained -an unusual or allergic reaction to levonorgestrel, other hormones, silicone, or polyethylene, medicines, foods, dyes, or preservatives -pregnant or trying to get pregnant -breast-feeding How should I use this medicine? This device is placed inside the uterus by a health care professional. Talk to your pediatrician regarding the use of this medicine in children. Special care may be needed. Overdosage: If you think you have taken too much of this medicine contact a poison control center or emergency room at once. NOTE: This medicine is only for you. Do not share this medicine with others. What if I miss a dose? This does not apply. What may interact with this medicine? Do not take this medicine with any of the following medications: -amprenavir -bosentan -fosamprenavir This medicine may also interact with  the following medications: -aprepitant -barbiturate medicines for inducing sleep or treating seizures -bexarotene -griseofulvin -medicines to treat seizures like carbamazepine, ethotoin, felbamate, oxcarbazepine, phenytoin, topiramate -modafinil -pioglitazone -rifabutin -rifampin -rifapentine -some medicines to treat HIV infection like atazanavir, indinavir, lopinavir, nelfinavir, tipranavir, ritonavir -St. John's wort -warfarin This list may not describe all possible interactions. Give your health care provider a list of all the medicines, herbs, non-prescription drugs, or dietary supplements you use. Also tell them if you smoke, drink alcohol, or use illegal drugs. Some items may interact with your medicine. What should I watch for while using this medicine? Visit your doctor or health care professional for regular check ups. See your doctor if you or your partner has sexual contact with others, becomes HIV positive, or gets a sexual transmitted disease. This product does not protect you against HIV infection (AIDS) or other sexually transmitted diseases. You can check the placement of the IUD yourself by reaching up to the top of your vagina with clean fingers to feel the threads. Do not pull on the threads. It is a good habit to check placement after each menstrual period. Call your doctor right away if you feel more of the IUD than just the threads or if you cannot feel the threads at all. The IUD may come out by itself. You may become pregnant if the device comes out. If you notice that the IUD has come out use a backup birth control method like condoms and call your health care provider. Using tampons will not change the position of the IUD and are okay to use during your period. What side effects may I notice from receiving this medicine?   Side effects that you should report to your doctor or health care professional as soon as possible: -allergic reactions like skin rash, itching or  hives, swelling of the face, lips, or tongue -fever, flu-like symptoms -genital sores -high blood pressure -no menstrual period for 6 weeks during use -pain, swelling, warmth in the leg -pelvic pain or tenderness -severe or sudden headache -signs of pregnancy -stomach cramping -sudden shortness of breath -trouble with balance, talking, or walking -unusual vaginal bleeding, discharge -yellowing of the eyes or skin Side effects that usually do not require medical attention (report to your doctor or health care professional if they continue or are bothersome): -acne -breast pain -change in sex drive or performance -changes in weight -cramping, dizziness, or faintness while the device is being inserted -headache -irregular menstrual bleeding within first 3 to 6 months of use -nausea This list may not describe all possible side effects. Call your doctor for medical advice about side effects. You may report side effects to FDA at 1-800-FDA-1088. Where should I keep my medicine? This does not apply. NOTE: This sheet is a summary. It may not cover all possible information. If you have questions about this medicine, talk to your doctor, pharmacist, or health care provider.    2016, Elsevier/[employer]. (2011-10-25 13:54:04)  

## 2015-11-18 NOTE — Progress Notes (Signed)
Patient ID: Hannah Baldwin, female   DOB: 04-Aug-1971, 45 y.o.   MRN: [id number]  Cc: painful heavy period, f/u for [place] result  HPI Hannah Baldwin is a 45 y.o. female.  [id number] Patient's last menstrual period was 10/30/2015.'  HPI Menses heavy and painful when not taking megace, which does control her bleeding. She wants to schedule mirena insertion Past Medical History  Diagnosis Date  . Asthma Dx 2014  . Pneumonia ~ 2000 X 1  . Anemia   . 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 Diabetes mellitus without complication [facility])     Past Surgical History  Procedure Laterality Date  . Cesarean section  1992    Family History  Problem Relation Age of Onset  . Asthma Mother   . Asthma Daughter     Social History Social History  Substance Use Topics  . Smoking status: Former Smoker -- 0.25 packs/day for 20 years    Types: Cigarettes    Quit date: 01/18/2015  . Smokeless tobacco: Never Used  . Alcohol Use: Yes     Comment: 01/20/2015 "dank some alcohol on my birthday, cookouts,  etc,;  maybe once/month"    No Known Allergies  Current Outpatient Prescriptions  Medication Sig Dispense Refill  . albuterol (PROVENTIL HFA;VENTOLIN HFA) 108 (90 Base) MCG/ACT inhaler Inhale 2 puffs into the lungs every 6 (six) hours as needed for wheezing or shortness of breath. 3 Inhaler 5  . albuterol (PROVENTIL) (2.5 MG/3ML) 0.083% nebulizer solution Take 2.5 mg by nebulization every 6 (six) hours as needed for wheezing or shortness of breath.    . blood glucose meter kit and supplies KIT Dispense based on patient and insurance preference. Use up to four times daily as directed. (FOR ICD-9 250.00, 250.01). 1 each 0  . Blood Glucose Monitoring Suppl (TRUE METRIX METER) W/DEVICE KIT USE AS INSTRUCTED 1 kit 0  . ferrous sulfate 325 (65 FE) MG tablet Take 1 tablet (325 mg total) by mouth 2 (two) times daily with a meal. 60 tablet 1  . gabapentin (NEURONTIN) 100 MG capsule Take 1-3  capsules (100-300 mg total) by mouth at bedtime. 90 capsule 0  . glucose blood (TRUE METRIX BLOOD GLUCOSE TEST) test strip 1 each by Other route daily. Use as instructed 100 each 12  . ibuprofen (ADVIL,MOTRIN) 600 MG tablet Take 1 tablet (600 mg total) by mouth every 6 (six) hours as needed. 30 tablet 0  . Lancets (FREESTYLE) lancets Use as instructed 100 each 12  . megestrol (MEGACE) 40 MG tablet Take 1-2 tablets (40-80 mg total) by mouth 2 (two) times daily. 120 tablet 1  . metFORMIN (GLUCOPHAGE) 500 MG tablet Take 1 tablet (500 mg total) by mouth 2 (two) times daily. 60 tablet 5  . traMADol (ULTRAM) 50 MG tablet Take 1 tablet (50 mg total) by mouth every 6 (six) hours as needed. 6 tablet 0  . traZODone (DESYREL) 50 MG tablet Take 0.5-1 tablets (25-50 mg total) by mouth at bedtime as needed for sleep. 30 tablet 3  . TRUEPLUS LANCETS 28G MISC Check blood sugar TID & QHS 100 each 2   No current facility-administered medications for this visit.    Review of Systems Review of Systems  Constitutional: Negative.   Gastrointestinal: Negative.   Genitourinary: Positive for menstrual problem and pelvic pain (cramps ).    Last menstrual period 10/30/2015.  Physical Exam Physical Exam  Constitutional: She  is oriented to person, place, and time. She appears well-developed. No distress.  Pulmonary/Chest: Effort normal.  Neurological: She is alert and oriented to person, place, and time.  Skin: Skin is warm and dry. No pallor.  Psychiatric: She has a normal mood and affect. Her behavior is normal.  Vitals reviewed.   CLINICAL DATA: Abnormal uterine bleeding. Anemia.  EXAM: TRANSABDOMINAL AND TRANSVAGINAL ULTRASOUND OF PELVIS  TECHNIQUE: Both transabdominal and transvaginal ultrasound examinations of the pelvis were performed. Transabdominal technique was performed for global imaging of the pelvis including uterus, ovaries, adnexal regions, and pelvic cul-de-sac. It was necessary to  proceed with endovaginal exam following the transabdominal exam to visualize the uterus and endometrium.  COMPARISON: 02/01/2015  FINDINGS: Uterus  Measurements: 12.2 x 6.3 x 8.7 cm. There is a fibroid identified within the left side of the uterus measuring 2.7 x 2.5 x 2.6 cm.  Endometrium  Thickness: 16 mm. No focal abnormality visualized.  Right ovary  Measurements: 2.6 x 2.2 x 2.1 cm. Normal appearance/no adnexal mass.  Left ovary  Measurements: 2.6 x 1.9 x 1.8 cm. Normal appearance/no adnexal mass.  Other findings  No abnormal free fluid.  IMPRESSION: 1. If bleeding remains unresponsive to hormonal or medical therapy, focal lesion work-up with sonohysterogram should be considered. Endometrial biopsy should also be considered in pre-menopausal patients at high risk for endometrial carcinoma. (Ref: Radiological Reasoning: Algorithmic Workup of Abnormal Vaginal Bleeding with Endovaginal Sonography and Sonohysterography. AJR 2008; 597:C16-38) 2. Uterine fibroid.   Electronically Signed  By: Kerby Moors M.D.  On: 11/02/2015 15:01  Assessment    Uterine fibroid and menorrhagia and dysmenorrhea previously managed with Megace Patient requests scheduled Mirena insertion    Plan    RTC for Mirena insertion, information given        Talen Poser 11/18/2015, 12:40 PM

## 2015-11-21 LAB — HM DIABETES EYE EXAM

## 2015-12-08 ENCOUNTER — Ambulatory Visit (INDEPENDENT_AMBULATORY_CARE_PROVIDER_SITE_OTHER): Payer: BLUE CROSS/BLUE SHIELD | Admitting: Obstetrics & Gynecology

## 2015-12-08 LAB — POCT URINALYSIS DIP (DEVICE)
GLUCOSE, UA: NEGATIVE mg/dL
Nitrite: NEGATIVE
PH: 7 (ref 5.0–8.0)
PROTEIN: 100 mg/dL — AB
SPECIFIC GRAVITY, URINE: 1.02 (ref 1.005–1.030)
Urobilinogen, UA: 1 mg/dL (ref 0.0–1.0)

## 2015-12-13 MED FILL — traZODone HCL 50 MG TABS: 50 | 30 days supply | Qty: 30 | Fill #1

## 2015-12-13 MED FILL — FERROUS SULFATE 325 MG TAB: 325 (65 FE) | 30 days supply | Qty: 60 | Fill #1

## 2015-12-19 MED FILL — MEGESTROL 40 MG TABLET: 40 | 30 days supply | Qty: 120 | Fill #1

## 2016-01-05 ENCOUNTER — Encounter: Payer: Self-pay | Admitting: General Practice

## 2016-01-05 ENCOUNTER — Ambulatory Visit (INDEPENDENT_AMBULATORY_CARE_PROVIDER_SITE_OTHER): Payer: BLUE CROSS/BLUE SHIELD | Admitting: Obstetrics and Gynecology

## 2016-01-05 ENCOUNTER — Encounter: Payer: Self-pay | Admitting: Obstetrics and Gynecology

## 2016-01-05 VITALS — BP 129/80 | HR 80 | Temp 98.0°F | Ht 61.0 in | Wt 311.5 lb

## 2016-01-05 DIAGNOSIS — Z3202 Encounter for pregnancy test, result negative: Secondary | ICD-10-CM | POA: Diagnosis not present

## 2016-01-05 DIAGNOSIS — Z30430 Encounter for insertion of intrauterine contraceptive device: Secondary | ICD-10-CM | POA: Diagnosis not present

## 2016-01-05 NOTE — Progress Notes (Signed)
Patient ID: Hannah Baldwin, female   DOB: 06-Dec-1970, 45 y.o.   MRN: [id number] 45 yo with DUB medically managed with megace here for IUD insertion. Patient has not had an endometrial biopsy as part of the evaluation for her DUB and declines to have it done today.  IUD Procedure Note Patient identified, informed consent performed, signed copy in chart, time out was performed.  Urine pregnancy test negative.  Speculum placed in the vagina.  Cervix visualized.  Cleaned with Betadine x 2.  Grasped anteriorly with a single tooth tenaculum.  Uterus sounded to 11 cm.  Mirena IUD placed per manufacturer's recommendations.  Strings trimmed to 3 cm. Tenaculum was removed, good hemostasis noted.  Patient tolerated procedure well.   Patient given post procedure instructions and Mirena care card with expiration date.  Patient is asked to check IUD strings periodically and follow up in 4-6 weeks for IUD check.

## 2016-01-06 LAB — POCT PREGNANCY, URINE: Preg Test, Ur: NEGATIVE

## 2016-01-23 ENCOUNTER — Encounter: Payer: Self-pay | Admitting: Family Medicine

## 2016-01-24 MED FILL — ?METFORMIN HCL 500MG TABLET: 500 | 30 days supply | Qty: 60 | Fill #1

## 2016-02-02 ENCOUNTER — Ambulatory Visit: Payer: BLUE CROSS/BLUE SHIELD | Admitting: Obstetrics and Gynecology

## 2016-03-06 ENCOUNTER — Other Ambulatory Visit: Payer: Self-pay | Admitting: *Deleted

## 2016-03-06 ENCOUNTER — Telehealth: Payer: Self-pay | Admitting: Family Medicine

## 2016-03-06 DIAGNOSIS — N938 Other specified abnormal uterine and vaginal bleeding: Secondary | ICD-10-CM

## 2016-03-06 MED ORDER — MEGESTROL ACETATE 40 MG PO TABS: 40.0000 mg | ORAL_TABLET | Freq: Two times a day (BID) | ORAL | Status: DC

## 2016-03-06 NOTE — Telephone Encounter (Signed)
Rx send to [facility]

## 2016-03-06 NOTE — Telephone Encounter (Signed)
Patient called requesting medication refill on megestrol (MEGACE) 40 MG tablet, patient states she has had heavy bleeding due to not having medication. Please f/u

## 2016-03-15 MED FILL — metFORMIN HCL 500 MG TABS: 500 | 30 days supply | Qty: 60 | Fill #2

## 2016-03-16 ENCOUNTER — Telehealth: Payer: Self-pay | Admitting: Family Medicine

## 2016-03-16 ENCOUNTER — Ambulatory Visit: Payer: BLUE CROSS/BLUE SHIELD | Admitting: Family Medicine

## 2016-03-16 ENCOUNTER — Other Ambulatory Visit: Payer: Self-pay | Admitting: Family Medicine

## 2016-03-16 DIAGNOSIS — G4700 Insomnia, unspecified: Secondary | ICD-10-CM

## 2016-03-16 DIAGNOSIS — E1142 Type 2 diabetes mellitus with diabetic polyneuropathy: Secondary | ICD-10-CM

## 2016-03-16 MED ORDER — METFORMIN HCL 500 MG PO TABS: 500.0000 mg | ORAL_TABLET | Freq: Two times a day (BID) | ORAL | Status: DC

## 2016-03-16 MED ORDER — TRAZODONE HCL 50 MG PO TABS: 25.0000 mg | ORAL_TABLET | Freq: Every evening | ORAL | Status: DC | PRN

## 2016-03-16 MED FILL — traZODone HCL 50 MG TABS: 50 | 30 days supply | Qty: 30 | Fill #0

## 2016-03-16 NOTE — Telephone Encounter (Signed)
Refilled metformin and trazodone. Patient due for 6 month follow up - needs appt for further refills. Will forward tramadol request to Dr. Adrian Blackwater.

## 2016-03-16 NOTE — Telephone Encounter (Signed)
Patient called to cancel appointment due to family emergency Unfortunately at the moment the office does not have an available appointments. Attempted to transfer patient to speak with manager. Patient proceeded with cursing and then hung up the phone.

## 2016-03-16 NOTE — Telephone Encounter (Signed)
Patient needs tramadol, metformin, trazadone. Please follow up.

## 2016-03-22 MED ORDER — TRAMADOL HCL 50 MG PO TABS: 50.0000 mg | ORAL_TABLET | Freq: Two times a day (BID) | ORAL | Status: DC | PRN

## 2016-03-22 NOTE — Telephone Encounter (Signed)
Tramadol refilled Patient needs to sign the contract as well

## 2016-03-23 MED FILL — traMADol HCL 50 MG TABS: 50 | 15 days supply | Qty: 30 | Fill #0

## 2016-03-23 NOTE — Telephone Encounter (Signed)
LVM Rx at front office ready to be pick up Form needs to be read and sign   Controlled Substance Agreement

## 2016-03-31 ENCOUNTER — Inpatient Hospital Stay (HOSPITAL_COMMUNITY)
Admission: AD | Admit: 2016-03-31 | Discharge: 2016-03-31 | Disposition: A | Discharge status: Home / Self Care | Payer: BLUE CROSS/BLUE SHIELD | Source: Ambulatory Visit | Attending: Obstetrics and Gynecology | Admitting: Obstetrics and Gynecology

## 2016-03-31 ENCOUNTER — Encounter (HOSPITAL_COMMUNITY): Payer: Self-pay | Admitting: *Deleted

## 2016-03-31 DIAGNOSIS — E119 Type 2 diabetes mellitus without complications: Secondary | ICD-10-CM | POA: Diagnosis not present

## 2016-03-31 DIAGNOSIS — Z7984 Long term (current) use of oral hypoglycemic drugs: Secondary | ICD-10-CM | POA: Diagnosis not present

## 2016-03-31 DIAGNOSIS — D500 Iron deficiency anemia secondary to blood loss (chronic): Secondary | ICD-10-CM | POA: Diagnosis not present

## 2016-03-31 DIAGNOSIS — D649 Anemia, unspecified: Secondary | ICD-10-CM | POA: Insufficient documentation

## 2016-03-31 DIAGNOSIS — N938 Other specified abnormal uterine and vaginal bleeding: Secondary | ICD-10-CM | POA: Insufficient documentation

## 2016-03-31 DIAGNOSIS — J45909 Unspecified asthma, uncomplicated: Secondary | ICD-10-CM | POA: Insufficient documentation

## 2016-03-31 DIAGNOSIS — Z87891 Personal history of nicotine dependence: Secondary | ICD-10-CM | POA: Insufficient documentation

## 2016-03-31 DIAGNOSIS — N644 Mastodynia: Secondary | ICD-10-CM

## 2016-03-31 LAB — CBC
HCT: 31.2 % — ABNORMAL LOW (ref 36.0–46.0)
Hemoglobin: 9.6 g/dL — ABNORMAL LOW (ref 12.0–15.0)
MCH: 23.2 pg — AB (ref 26.0–34.0)
MCHC: 30.8 g/dL (ref 30.0–36.0)
MCV: 75.4 fL — AB (ref 78.0–100.0)
PLATELETS: 532 10*3/uL — AB (ref 150–400)
RBC: 4.14 MIL/uL (ref 3.87–5.11)
RDW: 17.6 % — AB (ref 11.5–15.5)
WBC: 10.4 10*3/uL (ref 4.0–10.5)

## 2016-03-31 LAB — URINALYSIS, ROUTINE W REFLEX MICROSCOPIC
BILIRUBIN URINE: NEGATIVE
GLUCOSE, UA: NEGATIVE mg/dL
KETONES UR: NEGATIVE mg/dL
Leukocytes, UA: NEGATIVE
NITRITE: NEGATIVE
PH: 6 (ref 5.0–8.0)
Protein, ur: NEGATIVE mg/dL

## 2016-03-31 LAB — URINE MICROSCOPIC-ADD ON: Bacteria, UA: NONE SEEN

## 2016-03-31 LAB — POCT PREGNANCY, URINE: PREG TEST UR: NEGATIVE

## 2016-03-31 MED ORDER — KETOROLAC TROMETHAMINE 60 MG/2ML IM SOLN
60.0000 mg | Freq: Once | INTRAMUSCULAR | Status: AC
  Administered 2016-03-31: 60 mg via INTRAMUSCULAR
  Filled 2016-03-31: qty 2

## 2016-03-31 MED ORDER — MEGESTROL ACETATE 40 MG PO TABS: 40.0000 mg | ORAL_TABLET | Freq: Three times a day (TID) | ORAL | Status: DC

## 2016-03-31 MED ORDER — KETOROLAC TROMETHAMINE 10 MG PO TABS: 10.0000 mg | ORAL_TABLET | Freq: Four times a day (QID) | ORAL | Status: DC | PRN

## 2016-03-31 MED ORDER — FERROUS SULFATE 325 (65 FE) MG PO TABS: 325.0000 mg | ORAL_TABLET | Freq: Two times a day (BID) | ORAL | Status: DC

## 2016-03-31 NOTE — MAU Provider Note (Signed)
History   G2P2 no pregnant in with c/o bleeding x 3 wks. States had mirina IUD 3 mos ago for DUB it stopped, and 3 wks ago it started again.  CSN: [id number]  Arrival date and time: 03/31/16 1451   First Provider Initiated Contact with Patient 03/31/16 1617      Chief Complaint  Patient presents with  . Vaginal Bleeding  . Breast Pain   HPI  OB History    Gravida Para Term Preterm AB TAB SAB Ectopic Multiple Living   '2 2 0 0 0 0 0 0 0 2 '      Past Medical History  Diagnosis Date  . Asthma Dx 2014  . Anemia   . 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 Diabetes mellitus without complication ([place])   . Pneumonia ~ 2000 X 1    Past Surgical History  Procedure Laterality Date  . Cesarean section  1992    Family History  Problem Relation Age of Onset  . Asthma Mother   . Asthma Daughter     Social History  Substance Use Topics  . Smoking status: Former Smoker -- 0.25 packs/day for 20 years    Types: Cigarettes    Quit date: 01/18/2015  . Smokeless tobacco: Never Used  . Alcohol Use: Yes     Comment: 01/20/2015 "dank some alcohol on my birthday, cookouts,  etc,;  maybe once/month"    Allergies: No Known Allergies  Prescriptions prior to admission  Medication Sig Dispense Refill Last Dose  . albuterol (PROVENTIL HFA;VENTOLIN HFA) 108 (90 Base) MCG/ACT inhaler Inhale 2 puffs into the lungs every 6 (six) hours as needed for wheezing or shortness of breath. 3 Inhaler 5 Past Month at Unknown time  . albuterol (PROVENTIL) (2.5 MG/3ML) 0.083% nebulizer solution Take 2.5 mg by nebulization every 6 (six) hours as needed for wheezing or shortness of breath.   Past Month at Unknown time  . ferrous sulfate 325 (65 FE) MG tablet Take 1 tablet (325 mg total) by mouth 2 (two) times daily with a meal. 60 tablet 1 Past Month at Unknown time  . gabapentin (NEURONTIN) 100 MG capsule Take 1-3 capsules (100-300 mg total) by mouth at bedtime. 90 capsule 0 Past  Month at Unknown time  . megestrol (MEGACE) 40 MG tablet Take 1-2 tablets (40-80 mg total) by mouth 2 (two) times daily. 120 tablet 1 Past Month at Unknown time  . metFORMIN (GLUCOPHAGE) 500 MG tablet Take 1 tablet (500 mg total) by mouth 2 (two) times daily. 60 tablet 0 03/30/2016 at Unknown time  . traMADol (ULTRAM) 50 MG tablet Take 1 tablet (50 mg total) by mouth every 12 (twelve) hours as needed. 30 tablet 0 03/30/2016 at Unknown time  . traZODone (DESYREL) 50 MG tablet Take 0.5-1 tablets (25-50 mg total) by mouth at bedtime as needed for sleep. 30 tablet 0 03/30/2016 at Unknown time  . blood glucose meter kit and supplies KIT Dispense based on patient and insurance preference. Use up to four times daily as directed. (FOR ICD-9 250.00, 250.01). 1 each 0 Taking  . Blood Glucose Monitoring Suppl (TRUE METRIX METER) W/DEVICE KIT USE AS INSTRUCTED 1 kit 0 Taking  . glucose blood (TRUE METRIX BLOOD GLUCOSE TEST) test strip 1 each by Other route daily. Use as instructed 100 each 12 Taking  . ibuprofen (ADVIL,MOTRIN) 600 MG tablet Take 1 tablet (600 mg total) by mouth every 6 (six)  hours as needed. (Patient not taking: Reported on 03/31/2016) 30 tablet 0 Not Taking at Unknown time  . Lancets (FREESTYLE) lancets Use as instructed 100 each 12 Taking  . TRUEPLUS LANCETS 28G MISC Check blood sugar TID & QHS 100 each 2 Taking    Review of Systems  Constitutional: Negative.   HENT: Negative.   Eyes: Negative.   Respiratory: Negative.   Cardiovascular: Negative.   Gastrointestinal: Positive for abdominal pain.  Genitourinary: Negative.   Musculoskeletal: Negative.   Skin: Negative.   Neurological: Negative.   Endo/Heme/Allergies: Negative.   Psychiatric/Behavioral: Negative.    Physical Exam   Blood pressure 162/88, pulse 90, temperature 98 F (36.7 C), temperature source Oral, resp. rate 18.  Physical Exam  Constitutional: She is oriented to person, place, and time. She appears well-developed  and well-nourished.  HENT:  Head: Normocephalic.  Eyes: Pupils are equal, round, and reactive to light.  Neck: Normal range of motion.  Cardiovascular: Normal rate, regular rhythm, normal heart sounds and intact distal pulses.   Respiratory: Effort normal and breath sounds normal.  GI: Soft. Bowel sounds are normal.  Genitourinary: Vagina normal and uterus normal.  Musculoskeletal: Normal range of motion.  Neurological: She is alert and oriented to person, place, and time. She has normal reflexes.  Skin: Skin is warm and dry.  Psychiatric: She has a normal mood and affect. Her behavior is normal. Judgment and thought content normal.    MAU Course  Procedures  MDM DUB Morbid obesity. anemia  Assessment and Plan  Discussed different tx options for DUB pt encouraged to call GYN clinic for re eval. Will send message to clinic. Breast exam WNL, encouraged pt to get screening mamogram.   Koren Shiver 03/31/2016, 4:17 PM

## 2016-03-31 NOTE — Discharge Instructions (Signed)

## 2016-03-31 NOTE — MAU Note (Signed)
Pt states she had an IUD placed a few months ago in the clinic and she had a lot of bleeding before and after the IUD was placed.  Pt states that up until about three or four weeks ago the bleeding stopped for a week.  Pt states when the bleeding stopped her breasts started to hurt.  Pt states her breast have been hurting for two weeks straight.  Pt states the bleeding started about two weeks ago.  Pt states she is passing clots with the bleeding and has pictures of them.  Pt states the clots are often the size of a baseball but sometimes they are smaller in size as well.

## 2016-04-02 MED FILL — KETOROLAC 10 MG TABLET: 10 | 8 days supply | Qty: 30 | Fill #0

## 2016-04-02 MED FILL — MEGESTROL 40 MG TABLET: 40 | 30 days supply | Qty: 90 | Fill #0

## 2016-04-02 MED FILL — FERROUS SULFATE 325 MG TAB: 325 (65 FE) | 30 days supply | Qty: 60 | Fill #0

## 2016-04-03 ENCOUNTER — Other Ambulatory Visit: Payer: Self-pay | Admitting: Obstetrics and Gynecology

## 2016-04-03 ENCOUNTER — Other Ambulatory Visit: Payer: Self-pay

## 2016-04-03 DIAGNOSIS — Z1231 Encounter for screening mammogram for malignant neoplasm of breast: Secondary | ICD-10-CM

## 2016-04-12 ENCOUNTER — Ambulatory Visit: Payer: BLUE CROSS/BLUE SHIELD | Admitting: Family Medicine

## 2016-04-13 ENCOUNTER — Encounter: Payer: BLUE CROSS/BLUE SHIELD | Admitting: Family Medicine

## 2016-04-16 ENCOUNTER — Ambulatory Visit: Payer: BLUE CROSS/BLUE SHIELD

## 2016-04-26 ENCOUNTER — Other Ambulatory Visit: Payer: Self-pay | Admitting: Obstetrics and Gynecology

## 2016-04-26 ENCOUNTER — Ambulatory Visit
Admission: RE | Admit: 2016-04-26 | Discharge: 2016-04-26 | Disposition: A | Discharge status: Home / Self Care | Payer: BLUE CROSS/BLUE SHIELD | Source: Ambulatory Visit | Attending: Obstetrics and Gynecology | Admitting: Obstetrics and Gynecology

## 2016-04-26 DIAGNOSIS — Z1231 Encounter for screening mammogram for malignant neoplasm of breast: Secondary | ICD-10-CM

## 2016-04-30 ENCOUNTER — Ambulatory Visit: Payer: BLUE CROSS/BLUE SHIELD

## 2016-05-04 ENCOUNTER — Ambulatory Visit: Payer: BLUE CROSS/BLUE SHIELD

## 2016-05-07 MED FILL — MEGESTROL 40 MG TABLET: 40 | 30 days supply | Qty: 90 | Fill #1

## 2016-05-29 MED FILL — VENTOLIN HFA 90 MCG INHALER: 108 (90 BAS | 25 days supply | Qty: 18 | Fill #1

## 2016-05-29 MED FILL — metFORMIN HCL 500 MG TABS: 500 | 30 days supply | Qty: 60 | Fill #3

## 2016-06-13 ENCOUNTER — Telehealth: Payer: Self-pay | Admitting: Family Medicine

## 2016-06-13 DIAGNOSIS — N938 Other specified abnormal uterine and vaginal bleeding: Secondary | ICD-10-CM

## 2016-06-13 MED FILL — MEGESTROL 40 MG TABLET: 40 | 30 days supply | Qty: 90 | Fill #2

## 2016-06-13 NOTE — Telephone Encounter (Signed)
Pt called requesting to speak to PCP , pt states her IUD  has fallen out. Pt states since that happened she has started bleeding again and had an extra rx to help her but will be running out. Pt will call Monday 09/11 to schedule f/up. Please f/up

## 2016-06-14 NOTE — Telephone Encounter (Signed)
Will route to PCP 

## 2016-06-15 MED ORDER — MEGESTROL ACETATE 40 MG PO TABS: 40.0000 mg | ORAL_TABLET | Freq: Three times a day (TID) | ORAL | 3 refills | Status: DC

## 2016-06-15 NOTE — Telephone Encounter (Signed)
I have refilled megace for bleeding Patient advised to all gynecology for IUD check and replacement

## 2016-06-20 NOTE — Telephone Encounter (Signed)
Pt was called on 9/13 and informed of meds being sent over to pharmacy and to schedule an appointment with GYN for IUD check.

## 2016-07-17 MED FILL — MEGESTROL 40 MG TABLET: 40 | 30 days supply | Qty: 90 | Fill #3

## 2016-07-17 MED FILL — metFORMIN HCL 500 MG TABS: 500 | 30 days supply | Qty: 60 | Fill #4

## 2016-08-06 ENCOUNTER — Ambulatory Visit: Payer: BLUE CROSS/BLUE SHIELD

## 2016-08-08 ENCOUNTER — Ambulatory Visit: Payer: BLUE CROSS/BLUE SHIELD | Attending: Internal Medicine

## 2016-08-08 DIAGNOSIS — Z111 Encounter for screening for respiratory tuberculosis: Secondary | ICD-10-CM | POA: Insufficient documentation

## 2016-08-08 NOTE — Progress Notes (Signed)
Patient here for PPD Tuberculin skin test applied to left ventral forearm.  If test not read within 48-72 hours of initial placement, patient advised to repeat in other arm 1-3 weeks after this test.  Priscille Heidelberg, RN, BSN

## 2016-08-08 NOTE — Patient Instructions (Addendum)
Patient to return in 48-72 hours.  Appointment card given

## 2016-08-10 ENCOUNTER — Ambulatory Visit: Payer: Self-pay | Attending: Family Medicine | Admitting: Internal Medicine

## 2016-08-10 DIAGNOSIS — Z111 Encounter for screening for respiratory tuberculosis: Secondary | ICD-10-CM | POA: Insufficient documentation

## 2016-08-10 LAB — TB SKIN TEST
Induration: 0 mm
TB Skin Test: NEGATIVE

## 2016-08-10 NOTE — Patient Instructions (Addendum)
Letter provided. This test was negative for tuberculosis exposure per current Centers for Disease Control guidelines.  A chest x-ray was not required.

## 2016-08-10 NOTE — Progress Notes (Signed)
Patient here for PPD reading only.

## 2016-08-16 MED FILL — MEGESTROL 40 MG TABLET: 40 | 30 days supply | Qty: 90 | Fill #0

## 2016-10-17 MED FILL — MEGESTROL 40 MG TABLET: 40 | 30 days supply | Qty: 90 | Fill #1

## 2016-10-17 MED FILL — VENTOLIN HFA 90 MCG INHALER: 108 (90 BAS | 25 days supply | Qty: 18 | Fill #2

## 2016-10-17 MED FILL — metFORMIN HCL 500 MG TABS: 500 | 30 days supply | Qty: 60 | Fill #5

## 2016-10-23 ENCOUNTER — Ambulatory Visit: Payer: BLUE CROSS/BLUE SHIELD | Admitting: Family Medicine

## 2016-11-21 ENCOUNTER — Other Ambulatory Visit: Payer: Self-pay | Admitting: Obstetrics and Gynecology

## 2016-12-11 MED FILL — MEGESTROL 40 MG TABLET: 40 | 30 days supply | Qty: 90 | Fill #0

## 2016-12-20 ENCOUNTER — Ambulatory Visit: Payer: BLUE CROSS/BLUE SHIELD | Attending: Family Medicine | Admitting: Physician Assistant

## 2016-12-20 ENCOUNTER — Encounter: Payer: Self-pay | Admitting: Physician Assistant

## 2016-12-20 ENCOUNTER — Other Ambulatory Visit: Payer: Self-pay | Admitting: Family Medicine

## 2016-12-20 ENCOUNTER — Ambulatory Visit (HOSPITAL_COMMUNITY)
Admission: RE | Admit: 2016-12-20 | Discharge: 2016-12-20 | Disposition: A | Discharge status: Home / Self Care | Payer: Self-pay | Source: Ambulatory Visit | Attending: Physician Assistant | Admitting: Physician Assistant

## 2016-12-20 VITALS — BP 131/84 | HR 72 | Temp 97.7°F | Resp 20 | Ht 63.5 in | Wt 339.4 lb

## 2016-12-20 DIAGNOSIS — D649 Anemia, unspecified: Secondary | ICD-10-CM | POA: Insufficient documentation

## 2016-12-20 DIAGNOSIS — E669 Obesity, unspecified: Secondary | ICD-10-CM

## 2016-12-20 DIAGNOSIS — IMO0001 Reserved for inherently not codable concepts without codable children: Secondary | ICD-10-CM

## 2016-12-20 DIAGNOSIS — M25562 Pain in left knee: Secondary | ICD-10-CM

## 2016-12-20 DIAGNOSIS — Z6841 Body Mass Index (BMI) 40.0 and over, adult: Secondary | ICD-10-CM

## 2016-12-20 DIAGNOSIS — J4520 Mild intermittent asthma, uncomplicated: Secondary | ICD-10-CM

## 2016-12-20 DIAGNOSIS — H5462 Unqualified visual loss, left eye, normal vision right eye: Secondary | ICD-10-CM | POA: Insufficient documentation

## 2016-12-20 DIAGNOSIS — Z7984 Long term (current) use of oral hypoglycemic drugs: Secondary | ICD-10-CM | POA: Insufficient documentation

## 2016-12-20 DIAGNOSIS — E1142 Type 2 diabetes mellitus with diabetic polyneuropathy: Secondary | ICD-10-CM

## 2016-12-20 DIAGNOSIS — Z8709 Personal history of other diseases of the respiratory system: Secondary | ICD-10-CM

## 2016-12-20 DIAGNOSIS — M25762 Osteophyte, left knee: Secondary | ICD-10-CM | POA: Insufficient documentation

## 2016-12-20 LAB — CBC WITH DIFFERENTIAL/PLATELET
BASOS ABS: 0 {cells}/uL (ref 0–200)
BASOS PCT: 0 %
EOS ABS: 86 {cells}/uL (ref 15–500)
Eosinophils Relative: 1 %
HEMATOCRIT: 34.2 % — AB (ref 35.0–45.0)
Hemoglobin: 10.2 g/dL — ABNORMAL LOW (ref 11.7–15.5)
LYMPHS PCT: 30 %
Lymphs Abs: 2580 cells/uL (ref 850–3900)
MCH: 22.8 pg — AB (ref 27.0–33.0)
MCHC: 29.8 g/dL — ABNORMAL LOW (ref 32.0–36.0)
MCV: 76.3 fL — AB (ref 80.0–100.0)
MONO ABS: 516 {cells}/uL (ref 200–950)
MONOS PCT: 6 %
MPV: 9.1 fL (ref 7.5–12.5)
NEUTROS ABS: 5418 {cells}/uL (ref 1500–7800)
Neutrophils Relative %: 63 %
Platelets: 558 10*3/uL — ABNORMAL HIGH (ref 140–400)
RBC: 4.48 MIL/uL (ref 3.80–5.10)
RDW: 16.5 % — ABNORMAL HIGH (ref 11.0–15.0)
WBC: 8.6 10*3/uL (ref 3.8–10.8)

## 2016-12-20 LAB — POCT GLYCOSYLATED HEMOGLOBIN (HGB A1C): Hemoglobin A1C: 6.3

## 2016-12-20 LAB — COMPREHENSIVE METABOLIC PANEL
ALK PHOS: 83 U/L (ref 33–115)
ALT: 15 U/L (ref 6–29)
AST: 13 U/L (ref 10–30)
Albumin: 3.6 g/dL (ref 3.6–5.1)
BILIRUBIN TOTAL: 0.3 mg/dL (ref 0.2–1.2)
BUN: 10 mg/dL (ref 7–25)
CO2: 26 mmol/L (ref 20–31)
Calcium: 9.3 mg/dL (ref 8.6–10.2)
Chloride: 106 mmol/L (ref 98–110)
Creat: 0.85 mg/dL (ref 0.50–1.10)
Glucose, Bld: 68 mg/dL (ref 65–99)
Potassium: 4.5 mmol/L (ref 3.5–5.3)
Sodium: 140 mmol/L (ref 135–146)
Total Protein: 6.6 g/dL (ref 6.1–8.1)

## 2016-12-20 LAB — GLUCOSE, POCT (MANUAL RESULT ENTRY): POC GLUCOSE: 103 mg/dL — AB (ref 70–99)

## 2016-12-20 LAB — TSH: TSH: 2.49 mIU/L

## 2016-12-20 MED ORDER — ALBUTEROL SULFATE HFA 108 (90 BASE) MCG/ACT IN AERS: 2.0000 | INHALATION_SPRAY | Freq: Four times a day (QID) | RESPIRATORY_TRACT | 5 refills | Status: DC | PRN

## 2016-12-20 MED ORDER — METFORMIN HCL 500 MG PO TABS: 500.0000 mg | ORAL_TABLET | Freq: Two times a day (BID) | ORAL | 3 refills | Status: DC

## 2016-12-20 MED ORDER — MELOXICAM 15 MG PO TABS: 15.0000 mg | ORAL_TABLET | Freq: Every day | ORAL | 2 refills | Status: DC

## 2016-12-20 MED FILL — metFORMIN HCL 500 MG TABS: 500 | 30 days supply | Qty: 60 | Fill #0

## 2016-12-20 MED FILL — VENTOLIN HFA 90 MCG INHALER: 108 (90 BAS | 25 days supply | Qty: 18 | Fill #0

## 2016-12-20 MED FILL — MELOXICAM 15 MG TABLET: 15 | 30 days supply | Qty: 30 | Fill #0

## 2016-12-20 NOTE — Patient Instructions (Signed)
Carbohydrate Counting for Diabetes Mellitus, Adult Carbohydrate counting is a method for keeping track of how many carbohydrates you eat. Eating carbohydrates naturally increases the amount of sugar (glucose) in the blood. Counting how many carbohydrates you eat helps keep your blood glucose within normal limits, which helps you manage your diabetes (diabetes mellitus). It is important to know how many carbohydrates you can safely have in each meal. This is different for every person. A diet and nutrition specialist (registered dietitian) can help you make a meal plan and calculate how many carbohydrates you should have at each meal and snack. Carbohydrates are found in the following foods:  Grains, such as breads and cereals.  Dried beans and soy products.  Starchy vegetables, such as potatoes, peas, and corn.  Fruit and fruit juices.  Milk and yogurt.  Sweets and snack foods, such as cake, cookies, candy, chips, and soft drinks. How do I count carbohydrates? There are two ways to count carbohydrates in food. You can use either of the methods or a combination of both. Reading "Nutrition Facts" on packaged food  The "Nutrition Facts" list is included on the labels of almost all packaged foods and beverages in the [place] It includes:  The serving size.  Information about nutrients in each serving, including the grams (g) of carbohydrate per serving. To use the "Nutrition Facts":  Decide how many servings you will have.  Multiply the number of servings by the number of carbohydrates per serving.  The resulting number is the total amount of carbohydrates that you will be having. Learning standard serving sizes of other foods  When you eat foods containing carbohydrates that are not packaged or do not include "Nutrition Facts" on the label, you need to measure the servings in order to count the amount of carbohydrates:  Measure the foods that you will eat with a food scale or measuring  cup, if needed.  Decide how many standard-size servings you will eat.  Multiply the number of servings by 15. Most carbohydrate-rich foods have about 15 g of carbohydrates per serving.  For example, if you eat 8 oz (170 g) of strawberries, you will have eaten 2 servings and 30 g of carbohydrates (2 servings x 15 g = 30 g).  For foods that have more than one food mixed, such as soups and casseroles, you must count the carbohydrates in each food that is included. The following list contains standard serving sizes of common carbohydrate-rich foods. Each of these servings has about 15 g of carbohydrates:   hamburger bun or  English muffin.   oz (15 mL) syrup.   oz (14 g) jelly.  1 slice of bread.  1 six-inch tortilla.  3 oz (85 g) cooked rice or pasta.  4 oz (113 g) cooked dried beans.  4 oz (113 g) starchy vegetable, such as peas, corn, or potatoes.  4 oz (113 g) hot cereal.  4 oz (113 g) mashed potatoes or  of a large baked potato.  4 oz (113 g) canned or frozen fruit.  4 oz (120 mL) fruit juice.  4-6 crackers.  6 chicken nuggets.  6 oz (170 g) unsweetened dry cereal.  6 oz (170 g) plain fat-free yogurt or yogurt sweetened with artificial sweeteners.  8 oz (240 mL) milk.  8 oz (170 g) fresh fruit or one small piece of fruit.  24 oz (680 g) popped popcorn. Example of carbohydrate counting Sample meal  3 oz (85 g) chicken breast.  6 oz (  170 g) brown rice.  4 oz (113 g) corn.  8 oz (240 mL) milk.  8 oz (170 g) strawberries with sugar-free whipped topping. Carbohydrate calculation 1. Identify the foods that contain carbohydrates:  Rice.  Corn.  Milk.  Strawberries. 2. Calculate how many servings you have of each food:  2 servings rice.  1 serving corn.  1 serving milk.  1 serving strawberries. 3. Multiply each number of servings by 15 g:  2 servings rice x 15 g = 30 g.  1 serving corn x 15 g = 15 g.  1 serving milk x 15 g = 15  g.  1 serving strawberries x 15 g = 15 g. 4. Add together all of the amounts to find the total grams of carbohydrates eaten:  30 g + 15 g + 15 g + 15 g = 75 g of carbohydrates total. This information is not intended to replace advice given to you by your health care provider. Make sure you discuss any questions you have with your health care provider. Document Released: 09/24/2005 Document Revised: 04/13/2016 Document Reviewed: 03/07/2016 Elsevier Interactive Patient Education  2017 [employer]  

## 2016-12-20 NOTE — Progress Notes (Signed)
Hannah Baldwin, is a 46 y.o. female  XLK:[id number]  [id number]:[id number]  DOB - Nov 20, 1970  Subjective:  Chief Complaint and HPI: Hannah Baldwin is a 46 y.o. female here today for L knee pain.  She presents with a 4-5 month h/o L knee pain.  The knee cracks and pops and feels like it is going to give out at times.  NKI.  Pain is progressively worse and esp bad in the mornings.  She has taken pain meds of friends that has only provided minimal relief.   The pain is preventing her from being able to get around as easily and it is affecting her work.  She is out of metformin but was taking it until a few days ago.  She does not check her blood sugars.  She denies S/Sx hyper/hypoglycemia.     Social History:  Works as a Quarry manager, has several gradchildren  ROS:   Constitutional:  No f/c, No night sweats, No unexplained weight loss. EENT:  No vision changes, No blurry vision, No hearing changes. No mouth, throat, or ear problems.  Respiratory: No cough, No SOB Cardiac: No CP, no palpitations GI:  No abd pain, No N/V/D. GU: No Urinary s/sx Musculoskeletal: + L knee pain Neuro: No headache, no dizziness, no motor weakness.  Skin: No rash Endocrine:  No polydipsia. No polyuria.  Psych: Denies SI/HI  No problems updated.  ALLERGIES: No Known Allergies  PAST MEDICAL HISTORY: Past Medical History:  Diagnosis Date  . Anemia   . Asthma Dx 2014  . Diabetes mellitus without complication ([place])   . 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 Pneumonia ~ 2000 X 1    MEDICATIONS AT HOME: Prior to Admission medications   Medication Sig Start Date End Date Taking? Authorizing Provider  albuterol (PROVENTIL HFA;VENTOLIN HFA) 108 (90 Base) MCG/ACT inhaler Inhale 2 puffs into the lungs every 6 (six) hours as needed for wheezing or shortness of breath. 12/20/16  Yes Argentina Donovan, PA-C  albuterol (PROVENTIL) (2.5 MG/3ML) 0.083% nebulizer solution Take 2.5 mg by nebulization every 6  (six) hours as needed for wheezing or shortness of breath.   Yes Historical Provider, MD  blood glucose meter kit and supplies KIT Dispense based on patient and insurance preference. Use up to four times daily as directed. (FOR ICD-9 250.00, 250.01). 01/25/15  Yes Arnoldo Morale, MD  Blood Glucose Monitoring Suppl (TRUE METRIX METER) W/DEVICE KIT USE AS INSTRUCTED 09/23/15  Yes Josalyn Funches, MD  ferrous sulfate 325 (65 FE) MG tablet Take 1 tablet (325 mg total) by mouth 2 (two) times daily with a meal. 10/31/15  Yes Artist Pais Rasch, NP  gabapentin (NEURONTIN) 100 MG capsule Take 1-3 capsules (100-300 mg total) by mouth at bedtime. 07/25/15  Yes Josalyn Funches, MD  glucose blood (TRUE METRIX BLOOD GLUCOSE TEST) test strip 1 each by Other route daily. Use as instructed 11/03/15  Yes Josalyn Funches, MD  Lancets (FREESTYLE) lancets Use as instructed 02/15/15  Yes Deepak Advani, MD  megestrol (MEGACE) 40 MG tablet Take 1-2 tablets (40-80 mg total) by mouth 2 (two) times daily. 03/06/16  Yes Josalyn Funches, MD  metFORMIN (GLUCOPHAGE) 500 MG tablet Take 1 tablet (500 mg total) by mouth 2 (two) times daily. 12/20/16  Yes Argentina Donovan, PA-C  traZODone (DESYREL) 50 MG tablet Take 0.5-1 tablets (25-50 mg total) by mouth at bedtime as needed for sleep. 03/16/16  Yes Boykin Nearing, MD  TRUEPLUS LANCETS 28G MISC Check blood sugar TID & QHS 02/15/15  Yes Deepak Advani, MD  meloxicam (MOBIC) 15 MG tablet Take 1 tablet (15 mg total) by mouth daily. 12/20/16   Argentina Donovan, PA-C     Objective:  EXAM:   Vitals:   12/20/16 1026  BP: 131/84  Pulse: 72  Resp: 20  Temp: 97.7 F (36.5 C)  TempSrc: Oral  SpO2: 99%  Weight: (!) 339 lb 6.4 oz (154 kg)  Height: 5' 3.5" (1.613 m)    General appearance : A&OX3. NAD. Non-toxic-appearing, morbidly obese HEENT: Atraumatic and Normocephalic.  PERRLA. EOM intact.   Neck: supple, no JVD. No cervical lymphadenopathy. No thyromegaly Chest/Lungs:   Breathing-non-labored, Good air entry bilaterally, breath sounds normal without rales, rhonchi, or wheezing  CVS: S1 S2 regular, no murmurs, gallops, rubs  Extremities: Bilateral Lower Ext shows no edema, both legs are warm to touch with = pulse throughout.  R vs L knee examined-she is difficult due to obesity.  The skin is warm, dry , and intact.  There is no ballotment or obvious effusion/fluctuance.  The ligaments are stable except that there is some laxity of the L Knee laterally.  Negative McMurray's, neg ant/post drawer Neurology:  CN II-XII grossly intact, Non focal.   Psych:  TP linear. J/I WNL. Normal speech. Appropriate eye contact and affect.  Skin:  No Rash  Data Review Lab Results  Component Value Date   HGBA1C 6.3 12/20/2016   HGBA1C 5 7 11/03/2015   HGBA1C 5.60 07/25/2015     Assessment & Plan   1. Acute pain of left knee Laxity of lateral knee, concern for MCL - DG Knee Complete 4 Views Left; Future Refer-ortho - meloxicam (MOBIC) 15 MG tablet; Take 1 tablet (15 mg total) by mouth daily.  Dispense: 30 tablet; Refill: 2  2. Controlled type 2 diabetes mellitus with diabetic polyneuropathy, without long-term current use of insulin (HCC) suboptimal control but I believe it would improve with healthier diet and weight loss - POCT A1C - Glucose (CBG) - Microalbumin/Creatinine Ratio, Urine - metFORMIN (GLUCOPHAGE) 500 MG tablet; Take 1 tablet (500 mg total) by mouth 2 (two) times daily.  Dispense: 60 tablet; Refill: 3 - Comprehensive metabolic panel - CBC with Differential/Platelet  3. Mild intermittent asthma, unspecified whether complicated - albuterol (PROVENTIL HFA;VENTOLIN HFA) 108 (90 Base) MCG/ACT inhaler; Inhale 2 puffs into the lungs every 6 (six) hours as needed for wheezing or shortness of breath.  Dispense: 3 Inhaler; Refill: 5  4. Class 3 obesity with body mass index (BMI) of 50.0 to 59.9 in adult, unspecified obesity type, unspecified whether serious  comorbidity present ([place]) - TSH I encouraged her on diet and exercise.  Gave her specific tips and ideas.  She says she doesn't eat a lot and that she skips meals  Patient have been counseled extensively about nutrition and exercise  Return in about 10 weeks (around 02/28/2017) for Dr Adrian Blackwater; DM/obesity.  The patient was given clear instructions to go to ER or return to medical center if symptoms don't improve, worsen or new problems develop. The patient verbalized understanding. The patient was told to call to get lab results if they haven't heard anything in the next week.     Freeman Caldron, PA-C [facility] and [place], [place]   12/20/2016, 10:53 AMPatient ID: [name], female   DOB: 21-Aug-1971, 46 y.o.   MRN: [id number]

## 2016-12-20 NOTE — Progress Notes (Signed)
Patient is here for Left leg pain  Patient complains of left leg pain being present. Patient states her leg "gives out" at times.  Patient has not taken medication today. Patient has only drank coffee today.

## 2016-12-21 ENCOUNTER — Telehealth: Payer: Self-pay | Admitting: *Deleted

## 2016-12-21 LAB — MICROALBUMIN / CREATININE URINE RATIO
CREATININE, URINE: 243 mg/dL (ref 20–320)
Microalb Creat Ratio: 4 mcg/mg creat (ref <30)
Microalb, Ur: 0.9 mg/dL

## 2016-12-21 NOTE — Telephone Encounter (Signed)
Patient verified DOB Patient is aware of labs being normal except for being anemic. Patient advised to take iron tablets twice daily. Patient aware of x ray showing arthritis and being referred to sports medicine. Patient expressed her understanding and had no further questions.

## 2016-12-21 NOTE — Telephone Encounter (Signed)
-----   Message from Argentina Donovan, [place] sent at 12/21/2016  9:51 AM EDT ----- Please call patient and make sure she is taking her Iron 2 times daily as prescribed as she is still mildly anemic.  Also tell her to drink a lot of water.  Her other labs are otherwise normal including her kidney and liver function, electrolytes, and thyroid.  Thanks, Freeman Caldron, PA-C

## 2016-12-28 ENCOUNTER — Ambulatory Visit (INDEPENDENT_AMBULATORY_CARE_PROVIDER_SITE_OTHER): Payer: BLUE CROSS/BLUE SHIELD | Admitting: Family Medicine

## 2016-12-28 ENCOUNTER — Encounter: Payer: Self-pay | Admitting: Family Medicine

## 2016-12-28 VITALS — BP 155/85 | HR 73 | Ht 63.5 in | Wt 340.0 lb

## 2016-12-28 DIAGNOSIS — M25562 Pain in left knee: Secondary | ICD-10-CM | POA: Diagnosis not present

## 2016-12-28 MED ORDER — METHYLPREDNISOLONE ACETATE 40 MG/ML IJ SUSP
40.0000 mg | Freq: Once | INTRAMUSCULAR | Status: AC
  Administered 2016-12-28: 40 mg via INTRA_ARTICULAR

## 2016-12-28 NOTE — Assessment & Plan Note (Signed)
Left knee pain with xrays significant for degenerative changes. Given her body habitus there is most concern for OA of the knee which her xrays support. Steroid injection done today. Recommended weight loss in adjunct for reducing knee pain. She will follow up as needed for knee pain.

## 2016-12-28 NOTE — Progress Notes (Signed)
   Subjective:    Patient ID: Hannah Baldwin, female    DOB: 1971-05-18, 46 y.o.   MRN: [id number]   CC: left knee pain  HPI: 46 y/o F presents for left knee pain   Left knee pain - started over the last several months - worse with movement and walking - she has been treated with mobic with initially relief but now it does not help her - she had xrays of her kggggnee which showed mild degenerative changes - she has taken some medications from friends including narcotics  - she denies injury, or trauma - she works as a Quarry manager   Pertinent past medical history- morbid obesity, T2DM, asthma PSH- none Social history- former smoker, occasional etoh, no drug use   Review of Systems  Per HPI   Objective:  BP (!) 155/85   Pulse 73   Ht 5' 3.5" (1.613 m)   Wt (!) 340 lb (154.2 kg)   BMI 59.28 kg/m  Vitals and nursing note reviewed  General: NAD, morbidly obese MSK Left knee - overall normal appearing knee difficult to assess for effusion given body habitus - normal Knee ROM -some tenderness to palpation of the lateral knee - 5/5 bilateral LE strength   INJECTION: Patient was given informed consent, signed copy in the chart. Appropriate time out was taken. Area prepped and draped in usual sterile fashion. 1 cc of methylprednisolone 40 mg/ml plus  4 cc of 1% lidocaine without epinephrine was injected into the left knee using a(n) medial approach. The patient tolerated the procedure well. There were no complications. Post procedure instructions were given.   Assessment & Plan:    Left knee pain Left knee pain with xrays significant for degenerative changes. Given her body habitus there is most concern for OA of the knee which her xrays support. Steroid injection done today. Recommended weight loss in adjunct for reducing knee pain. She will follow up as needed for knee pain.     Donnalee Cellucci A. Lincoln Brigham MD, [facility] Resident PGY-3 Pager [phone]

## 2016-12-30 NOTE — Progress Notes (Signed)
[facility] Attending Note: I have seen and examined this patient. I have discussed this patient with the resident and reviewed the assessment and plan as documented above. I agree with the resident's findings and plan.  

## 2017-01-03 MED FILL — MEGESTROL 40 MG TABLET: 40 | 30 days supply | Qty: 90 | Fill #1

## 2017-01-28 MED FILL — MELOXICAM 15 MG TABLET: 15 | 30 days supply | Qty: 30 | Fill #1

## 2017-02-18 MED FILL — MEGESTROL 40 MG TABLET: 40 | 30 days supply | Qty: 90 | Fill #2

## 2017-02-18 MED FILL — metFORMIN HCL 500 MG TABS: 500 | 30 days supply | Qty: 60 | Fill #1

## 2017-02-20 ENCOUNTER — Encounter: Payer: Self-pay | Admitting: Family Medicine

## 2017-02-25 ENCOUNTER — Other Ambulatory Visit: Payer: Self-pay | Admitting: Physician Assistant

## 2017-02-25 DIAGNOSIS — M25562 Pain in left knee: Secondary | ICD-10-CM

## 2017-02-25 MED FILL — MELOXICAM 15 MG TABLET: 15 | 30 days supply | Qty: 30 | Fill #2

## 2017-02-28 ENCOUNTER — Ambulatory Visit: Payer: BLUE CROSS/BLUE SHIELD | Admitting: Family Medicine

## 2017-03-08 ENCOUNTER — Ambulatory Visit: Payer: Self-pay | Attending: Internal Medicine | Admitting: Physician Assistant

## 2017-03-08 ENCOUNTER — Encounter: Payer: Self-pay | Admitting: Physician Assistant

## 2017-03-08 VITALS — BP 131/82 | HR 77 | Temp 97.9°F | Resp 18 | Ht 63.5 in | Wt 348.0 lb

## 2017-03-08 DIAGNOSIS — D509 Iron deficiency anemia, unspecified: Secondary | ICD-10-CM | POA: Insufficient documentation

## 2017-03-08 DIAGNOSIS — J45909 Unspecified asthma, uncomplicated: Secondary | ICD-10-CM | POA: Insufficient documentation

## 2017-03-08 DIAGNOSIS — J4520 Mild intermittent asthma, uncomplicated: Secondary | ICD-10-CM | POA: Insufficient documentation

## 2017-03-08 DIAGNOSIS — R609 Edema, unspecified: Secondary | ICD-10-CM | POA: Insufficient documentation

## 2017-03-08 DIAGNOSIS — E1142 Type 2 diabetes mellitus with diabetic polyneuropathy: Secondary | ICD-10-CM | POA: Insufficient documentation

## 2017-03-08 DIAGNOSIS — M7989 Other specified soft tissue disorders: Secondary | ICD-10-CM | POA: Insufficient documentation

## 2017-03-08 DIAGNOSIS — Z79899 Other long term (current) drug therapy: Secondary | ICD-10-CM | POA: Insufficient documentation

## 2017-03-08 DIAGNOSIS — Z0001 Encounter for general adult medical examination with abnormal findings: Secondary | ICD-10-CM | POA: Insufficient documentation

## 2017-03-08 DIAGNOSIS — Z7984 Long term (current) use of oral hypoglycemic drugs: Secondary | ICD-10-CM | POA: Insufficient documentation

## 2017-03-08 LAB — GLUCOSE, POCT (MANUAL RESULT ENTRY): POC Glucose: 96 mg/dl (ref 70–99)

## 2017-03-08 MED ORDER — FLUTICASONE-SALMETEROL 250-50 MCG/DOSE IN AEPB: 1.0000 | INHALATION_SPRAY | Freq: Two times a day (BID) | RESPIRATORY_TRACT | 2 refills | Status: DC

## 2017-03-08 MED ORDER — FUROSEMIDE 20 MG PO TABS: 20.0000 mg | ORAL_TABLET | Freq: Every day | ORAL | 0 refills | Status: DC

## 2017-03-08 MED FILL — ?FUROSEMIDE 20 MG TABLET: 20 | 10 days supply | Qty: 10 | Fill #0

## 2017-03-08 NOTE — Progress Notes (Signed)
Hannah Baldwin, is a 46 y.o. female  SJG:[id number]  MLY:[id number]  DOB - 1970-11-05  Subjective:  Chief Complaint and HPI: Hannah Baldwin is a 46 y.o. female here today for multiple issues:  1)time for diabetes check.  Compliant with meds.  Blood sugars running in low to mid-100s. 2) R leg swelling esp at night for the last 2 weeks. B legs swell but R is worse.  NKI.  No new SOB but does c/o wheezing with asthma.  Denies orthopnea but says she has never been able to lay flat on her back bc her breasts are so large.  No CP.  No new DOE.  No palpitations.  +smoker.  3)wheezing a lot lately.  Using albuterol frequently. 4)has started going to a methadone clinic.  Prior to methadone clinic, she had been buying percocet off the street for knee pain and other various aches and pains.  She denies overuse or abuse.  ROS:   Constitutional:  No f/c, No night sweats, No unexplained weight loss. EENT:  No vision changes, No blurry vision, No hearing changes. No mouth, throat, or ear problems.  Respiratory: No cough, No new SOB, + wheezing Cardiac: No CP, no palpitations GI:  No abd pain, No N/V/D. GU: No Urinary s/sx Musculoskeletal: generalized aches and pains Neuro: No headache, no dizziness, no motor weakness.  Skin: No rash Endocrine:  No polydipsia. No polyuria.  Psych: Denies SI/HI  No problems updated.  ALLERGIES: No Known Allergies  PAST MEDICAL HISTORY: Past Medical History:  Diagnosis Date  . Anemia   . Asthma Dx 2014  . Diabetes mellitus without complication ([place])   . 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 Pneumonia ~ 2000 X 1    MEDICATIONS AT HOME: Prior to Admission medications   Medication Sig Start Date End Date Taking? Authorizing Provider  methadone (METHADOSE) 40 MG disintegrating tablet Take 45 mg by mouth daily.   Yes [provider]  albuterol (PROVENTIL HFA;VENTOLIN HFA) 108 (90 Base) MCG/ACT inhaler Inhale 2 puffs into the  lungs every 6 (six) hours as needed for wheezing or shortness of breath. 12/20/16   Argentina Donovan, PA-C  albuterol (PROVENTIL) (2.5 MG/3ML) 0.083% nebulizer solution Take 2.5 mg by nebulization every 6 (six) hours as needed for wheezing or shortness of breath.    [provider]  blood glucose meter kit and supplies KIT Dispense based on patient and insurance preference. Use up to four times daily as directed. (FOR ICD-9 250.00, 250.01). 01/25/15   Arnoldo Morale, MD  Blood Glucose Monitoring Suppl (TRUE METRIX METER) W/DEVICE KIT USE AS INSTRUCTED 09/23/15   Boykin Nearing, MD  ferrous sulfate 325 (65 FE) MG tablet Take 1 tablet (325 mg total) by mouth 2 (two) times daily with a meal. 10/31/15   Rasch, Artist Pais, NP  Fluticasone-Salmeterol (ADVAIR) 250-50 MCG/DOSE AEPB Inhale 1 puff into the lungs 2 (two) times daily. 03/08/17   Argentina Donovan, PA-C  furosemide (LASIX) 20 MG tablet Take 1 tablet (20 mg total) by mouth daily. 03/08/17   Argentina Donovan, PA-C  gabapentin (NEURONTIN) 100 MG capsule Take 1-3 capsules (100-300 mg total) by mouth at bedtime. 07/25/15   Funches, Adriana Mccallum, MD  glucose blood (TRUE METRIX BLOOD GLUCOSE TEST) test strip 1 each by Other route daily. Use as instructed 11/03/15   Boykin Nearing, MD  Lancets (FREESTYLE) lancets Use as instructed 02/15/15   Lorayne Marek, MD  megestrol (MEGACE) 40 MG tablet Take 1-2 tablets (40-80 mg total) by mouth 2 (two) times daily. 03/06/16   Funches, Adriana Mccallum, MD  meloxicam (MOBIC) 15 MG tablet Take 1 tablet (15 mg total) by mouth daily. 12/20/16   Argentina Donovan, PA-C  metFORMIN (GLUCOPHAGE) 500 MG tablet Take 1 tablet (500 mg total) by mouth 2 (two) times daily. 12/20/16   Argentina Donovan, PA-C  traZODone (DESYREL) 50 MG tablet Take 0.5-1 tablets (25-50 mg total) by mouth at bedtime as needed for sleep. 03/16/16   Boykin Nearing, MD  TRUEPLUS LANCETS 28G MISC Check blood sugar TID & QHS 02/15/15   Lorayne Marek, MD      Objective:  EXAM:   Vitals:   03/08/17 1029  BP: 131/82  Pulse: 77  Resp: 18  Temp: 97.9 F (36.6 C)  TempSrc: Oral  SpO2: 99%  Weight: (!) 348 lb (157.9 kg)  Height: 5' 3.5" (1.613 m)    General appearance : A&OX3. NAD. Non-toxic-appearing, morbidly obese HEENT: Atraumatic and Normocephalic.  PERRLA. EOM intact.   Neck: supple, no JVD. No cervical lymphadenopathy. No thyromegaly Chest/Lungs:  Breathing-non-labored, Good air entry bilaterally but there is moderate wheezing throughout without rales/rhonchi.   CVS: S1 S2 regular, no murmurs, gallops, rubs  Extremities: Bilateral Lower Ext shows 1+ edema with R being slightly>L.  There is TTP along the back of the lower leg but no Homan's sign,  both legs are warm to touch with = pulse throughout Neurology:  CN II-XII grossly intact, Non focal.   Psych:  TP linear. J/I WNL. Normal speech. Appropriate eye contact and affect.  Skin:  No Rash  Data Review Lab Results  Component Value Date   HGBA1C 6.3 12/20/2016   HGBA1C 5 7 11/03/2015   HGBA1C 5.60 07/25/2015     Assessment & Plan   1. Right leg swelling - VAS [place] LOWER EXTREMITY VENOUS (DVT); Future Call 911 if any sudden CP/SOB  2. Hypochromic microcytic anemia - CBC with Differential/Platelet  3. Controlled type 2 diabetes mellitus with diabetic polyneuropathy, without long-term current use of insulin (HCC) Adequate control.  Continue current regimen.  Continue to work on diet/exercise - Glucose (CBG)  4. Mild intermittent asthma, unspecified whether complicated uncontrolled - Fluticasone-Salmeterol (ADVAIR) 250-50 MCG/DOSE AEPB; Inhale 1 puff into the lungs 2 (two) times daily.  Dispense: 60 each; Refill: 2  5. Edema, unspecified type R leg >L - Basic metabolic panel - furosemide (LASIX) 20 MG tablet; Take 1 tablet (20 mg total) by mouth daily.  Dispense: 10 tablet; Refill: 0  Patient have been counseled extensively about nutrition and exercise  Return  in about 3 weeks (around 03/29/2017) for Dr Adrian Blackwater for edema and asthma; DM under adequate control.  The patient was given clear instructions to go to ER or return to medical center if symptoms don't improve, worsen or new problems develop. The patient verbalized understanding. The patient was told to call to get lab results if they haven't heard anything in the next week.     Freeman Caldron, PA-C [facility] and [facility], [place]   03/08/2017, 10:45 AM

## 2017-03-08 NOTE — Progress Notes (Signed)
Patient is here for EDEMA  Patient complains of right foot and left leg swelling beginning last week. Patient has taken methadone today. Patient has not eaten today. (drank coffee).

## 2017-03-09 LAB — CBC WITH DIFFERENTIAL/PLATELET
Basophils Absolute: 0 10*3/uL (ref 0.0–0.2)
Basos: 0 %
EOS (ABSOLUTE): 0.2 10*3/uL (ref 0.0–0.4)
EOS: 3 %
HEMATOCRIT: 33.9 % — AB (ref 34.0–46.6)
Hemoglobin: 10.3 g/dL — ABNORMAL LOW (ref 11.1–15.9)
Immature Grans (Abs): 0 10*3/uL (ref 0.0–0.1)
Immature Granulocytes: 0 %
LYMPHS ABS: 2.4 10*3/uL (ref 0.7–3.1)
Lymphs: 34 %
MCH: 23.6 pg — AB (ref 26.6–33.0)
MCHC: 30.4 g/dL — AB (ref 31.5–35.7)
MCV: 78 fL — ABNORMAL LOW (ref 79–97)
MONOS ABS: 0.4 10*3/uL (ref 0.1–0.9)
Monocytes: 5 %
NEUTROS ABS: 4.1 10*3/uL (ref 1.4–7.0)
Neutrophils: 58 %
Platelets: 448 10*3/uL — ABNORMAL HIGH (ref 150–379)
RBC: 4.36 x10E6/uL (ref 3.77–5.28)
RDW: 18.1 % — ABNORMAL HIGH (ref 12.3–15.4)
WBC: 7.1 10*3/uL (ref 3.4–10.8)

## 2017-03-09 LAB — BASIC METABOLIC PANEL
BUN / CREAT RATIO: 14 (ref 9–23)
BUN: 12 mg/dL (ref 6–24)
CO2: 25 mmol/L (ref 18–29)
CREATININE: 0.84 mg/dL (ref 0.57–1.00)
Calcium: 9 mg/dL (ref 8.7–10.2)
Chloride: 106 mmol/L (ref 96–106)
GFR, EST AFRICAN AMERICAN: 97 mL/min/{1.73_m2} (ref >59)
GFR, EST NON AFRICAN AMERICAN: 84 mL/min/{1.73_m2} (ref >59)
Glucose: 85 mg/dL (ref 65–99)
Potassium: 4.5 mmol/L (ref 3.5–5.2)
SODIUM: 144 mmol/L (ref 134–144)

## 2017-03-11 ENCOUNTER — Ambulatory Visit (HOSPITAL_COMMUNITY)
Admission: RE | Admit: 2017-03-11 | Discharge: 2017-03-11 | Disposition: A | Discharge status: Home / Self Care | Payer: Self-pay | Source: Ambulatory Visit | Attending: Physician Assistant | Admitting: Physician Assistant

## 2017-03-11 DIAGNOSIS — M7989 Other specified soft tissue disorders: Secondary | ICD-10-CM | POA: Insufficient documentation

## 2017-03-11 NOTE — Progress Notes (Signed)
*  Preliminary Results* Bilateral lower extremity venous duplex completed. Bilateral lower extremities are negative for deep vein thrombosis. There is no evidence of Baker's cyst bilaterally.  03/11/2017 11:09 AM Maudry Mayhew, BS, RVT, RDCS, RDMS

## 2017-03-14 ENCOUNTER — Telehealth: Payer: Self-pay | Admitting: *Deleted

## 2017-03-14 NOTE — Telephone Encounter (Signed)
Medical Assistant left message on patient's home and cell voicemail. Voicemail states to give a call back to [place] with [facility] at [phone]. !!!Please inform patient of NO BLOOD CLOT being noted!!!

## 2017-03-14 NOTE — Telephone Encounter (Signed)
-----   Message from Argentina Donovan, [place] sent at 03/13/2017  3:16 PM EDT ----- Please call patient.  Her venous study did not show any blood clot.  Thanks, Freeman Caldron, PA-C

## 2017-03-15 ENCOUNTER — Telehealth: Payer: Self-pay | Admitting: Family Medicine

## 2017-03-15 NOTE — Telephone Encounter (Signed)
-----   Message from Argentina Donovan, [place] sent at 03/13/2017  3:16 PM EDT ----- Please call patient.  Her venous study did not show any blood clot.  Thanks, Freeman Caldron, PA-C

## 2017-03-15 NOTE — Telephone Encounter (Signed)
Pt. Returned nurse call. Please f/u with pt.  °

## 2017-03-15 NOTE — Telephone Encounter (Signed)
Pt. Returned call and was informed of no blood clots.

## 2017-03-25 ENCOUNTER — Other Ambulatory Visit: Payer: Self-pay | Admitting: Physician Assistant

## 2017-03-25 ENCOUNTER — Encounter: Payer: Self-pay | Admitting: Family Medicine

## 2017-03-25 DIAGNOSIS — M25562 Pain in left knee: Secondary | ICD-10-CM

## 2017-03-27 MED FILL — MELOXICAM 15 MG TABLET: 15 | 30 days supply | Qty: 30 | Fill #0

## 2017-04-04 MED ORDER — FERROUS SULFATE 325 (65 FE) MG PO TABS
325.0000 mg | ORAL_TABLET | Freq: Two times a day (BID) | ORAL | 1 refills | Status: DC
Start: 2017-04-04 — End: 2017-11-18

## 2017-04-04 MED FILL — FERROUS SULFATE 325 MG TAB: 325 (65 FE) | 30 days supply | Qty: 60 | Fill #0

## 2017-04-04 NOTE — Telephone Encounter (Signed)
Patient verified DOB Patient is aware of needing iron supplements due to hemoglobin being 10.3.  Patient is aware of refills being sent to [facility] pharmacy.

## 2017-04-04 NOTE — Telephone Encounter (Signed)
-----   Message from Argentina Donovan, [place] sent at 03/13/2017  3:14 PM EDT ----- Please call patient and let her know to make sure and take her iron tablets because her hemoglobin is 10.3.  Her other labs are normal.  Thanks, Freeman Caldron, PA-C

## 2017-04-09 ENCOUNTER — Encounter: Payer: Self-pay | Admitting: Internal Medicine

## 2017-04-09 ENCOUNTER — Ambulatory Visit: Payer: Self-pay | Attending: Internal Medicine | Admitting: Internal Medicine

## 2017-04-09 VITALS — BP 110/80 | HR 65 | Temp 98.4°F | Resp 16 | Wt 340.6 lb

## 2017-04-09 DIAGNOSIS — Z6841 Body Mass Index (BMI) 40.0 and over, adult: Secondary | ICD-10-CM | POA: Insufficient documentation

## 2017-04-09 DIAGNOSIS — M1712 Unilateral primary osteoarthritis, left knee: Secondary | ICD-10-CM | POA: Insufficient documentation

## 2017-04-09 DIAGNOSIS — Z7984 Long term (current) use of oral hypoglycemic drugs: Secondary | ICD-10-CM | POA: Insufficient documentation

## 2017-04-09 DIAGNOSIS — M25562 Pain in left knee: Secondary | ICD-10-CM | POA: Insufficient documentation

## 2017-04-09 DIAGNOSIS — E1142 Type 2 diabetes mellitus with diabetic polyneuropathy: Secondary | ICD-10-CM

## 2017-04-09 DIAGNOSIS — Z87891 Personal history of nicotine dependence: Secondary | ICD-10-CM | POA: Insufficient documentation

## 2017-04-09 DIAGNOSIS — D509 Iron deficiency anemia, unspecified: Secondary | ICD-10-CM | POA: Insufficient documentation

## 2017-04-09 DIAGNOSIS — J449 Chronic obstructive pulmonary disease, unspecified: Secondary | ICD-10-CM | POA: Insufficient documentation

## 2017-04-09 DIAGNOSIS — N938 Other specified abnormal uterine and vaginal bleeding: Secondary | ICD-10-CM | POA: Insufficient documentation

## 2017-04-09 DIAGNOSIS — R600 Localized edema: Secondary | ICD-10-CM

## 2017-04-09 DIAGNOSIS — B351 Tinea unguium: Secondary | ICD-10-CM | POA: Insufficient documentation

## 2017-04-09 DIAGNOSIS — D649 Anemia, unspecified: Secondary | ICD-10-CM | POA: Insufficient documentation

## 2017-04-09 DIAGNOSIS — E1140 Type 2 diabetes mellitus with diabetic neuropathy, unspecified: Secondary | ICD-10-CM | POA: Insufficient documentation

## 2017-04-09 DIAGNOSIS — M7989 Other specified soft tissue disorders: Secondary | ICD-10-CM | POA: Insufficient documentation

## 2017-04-09 DIAGNOSIS — IMO0001 Reserved for inherently not codable concepts without codable children: Secondary | ICD-10-CM

## 2017-04-09 DIAGNOSIS — E669 Obesity, unspecified: Secondary | ICD-10-CM

## 2017-04-09 DIAGNOSIS — J4540 Moderate persistent asthma, uncomplicated: Secondary | ICD-10-CM | POA: Insufficient documentation

## 2017-04-09 DIAGNOSIS — D259 Leiomyoma of uterus, unspecified: Secondary | ICD-10-CM | POA: Insufficient documentation

## 2017-04-09 DIAGNOSIS — G4700 Insomnia, unspecified: Secondary | ICD-10-CM | POA: Insufficient documentation

## 2017-04-09 LAB — GLUCOSE, POCT (MANUAL RESULT ENTRY): POC Glucose: 104 mg/dl — AB (ref 70–99)

## 2017-04-09 MED ORDER — ALBUTEROL SULFATE HFA 108 (90 BASE) MCG/ACT IN AERS: 2.0000 | INHALATION_SPRAY | Freq: Four times a day (QID) | RESPIRATORY_TRACT | 5 refills | Status: DC | PRN

## 2017-04-09 MED ORDER — FUROSEMIDE 20 MG PO TABS: 20.0000 mg | ORAL_TABLET | Freq: Every day | ORAL | 3 refills | Status: DC

## 2017-04-09 MED ORDER — ACETAMINOPHEN 325 MG PO TABS: 650.0000 mg | ORAL_TABLET | Freq: Three times a day (TID) | ORAL | 4 refills | Status: DC

## 2017-04-09 MED FILL — ?FUROSEMIDE 20 MG TABLET: 20 | 30 days supply | Qty: 30 | Fill #0

## 2017-04-09 MED FILL — !VENTOLIN HFA INHALER: 108 (90 BAS | 25 days supply | Qty: 18 | Fill #0

## 2017-04-09 NOTE — Progress Notes (Signed)
Patient ID: Hannah Baldwin, female    DOB: 12/22/1970  MRN: [id number]  CC: re-establish   Subjective: Hannah Baldwin is a 46 y.o. female who presents to become established with me as PCP.  Saw PA, MClung 1 mth ago. Her concerns today include:  Patient with history of diabetes type 2 with neuropathy, obesity, asthma, osteoarthritis of the left knee, iron deficiency anemia,DUB with fibroid  Doppler US done on RT leg on last visit was negative for DVT  1. LT knee: -c/o swelling and pain in knee -seen by sports medicine 3-4 mths ago.  Given steriod inject which helped for a mth. -Mobic was helping for a while but now does not seem to be enough.  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 Obesity: "I really don't eat like that.  I eat twice a day." -love juices and regular sodas. Drinks 4 cups of sugar sweetened coffee during the day. Admits to eating rice and pasta often.  "I love rice. I'm not a cooker so I eat out a lot because its just me." -loss 8 lbs since last visit. "I'm very active but by my knee been hurting me, it has been slowing me down."  3. Anemia -just started iron as recommended -DUB in past -on Megace BID. This is the only thing that keeps bleeding under control. Gets spotting every now and then.   -Last saw GYN, Hannah Baldwin 12/2015.  Mirena placed but pt said it fell out. -US done 10/2015 revealed uterine fibroid.   4.  DM -checking BS every night.  Range 90-low 100s.  Nothing in the 200 range. -due for eye exam which she plans to have done once she gets insurance later this month..    5. Asthma -out of Albuterol MDI  x 2 wks and did not get Advair as yet.  Normally has to use Albuterol daily -some increase SOB  and wheezing since being out  Patient Active Problem List   Diagnosis Date Noted  . Primary osteoarthritis of left knee 04/09/2017  . Left knee pain 12/28/2016  . Insomnia 11/03/2015  . Onychomycosis of toenail 07/25/2015  . Diabetic neuropathy ([place]) 07/25/2015  . DUB (dysfunctional uterine bleeding) 02/03/2015  . Diabetes type 2, controlled ([place]) 01/25/2015  . Severe obesity (BMI >= 40) ([place]) 01/25/2015  . Asthma 01/21/2015  . COPD (chronic obstructive pulmonary disease) ([place]) 01/20/2015  . Cardiomegaly 06/09/2014  . Hypochromic microcytic anemia 06/08/2014  . Tobacco use disorder 01/01/2013     Current Outpatient Prescriptions on File Prior to Visit  Medication Sig Dispense Refill  . albuterol (PROVENTIL) (2.5 MG/3ML) 0.083% nebulizer solution Take 2.5 mg by nebulization every 6 (six) hours as needed for wheezing or shortness of breath.    . blood glucose meter kit and supplies KIT Dispense based on patient and insurance preference. Use up to four times daily as directed. (FOR ICD-9 250.00, 250.01). 1 each 0  . Blood Glucose Monitoring Suppl (TRUE METRIX METER) W/DEVICE KIT USE AS INSTRUCTED 1 kit 0  . ferrous sulfate 325 (65 FE) MG tablet Take 1 tablet (325 mg total) by mouth 2 (two) times daily with a meal. 60 tablet 1  . Fluticasone-Salmeterol (ADVAIR) 250-50  MCG/DOSE AEPB Inhale 1 puff into the lungs 2 (two) times daily. 60 each 2  . gabapentin (NEURONTIN) 100 MG capsule Take 1-3 capsules (100-300 mg total) by mouth at bedtime. 90 capsule 0  . glucose blood (TRUE METRIX BLOOD GLUCOSE TEST) test strip 1 each by Other route daily. Use as instructed 100 each 12  . Lancets (FREESTYLE) lancets Use as instructed 100 each 12  . megestrol (MEGACE) 40 MG tablet Take 1-2 tablets (40-80 mg total) by mouth 2 (two) times daily. 120 tablet 1  . meloxicam (MOBIC) 15 MG tablet TAKE 1 TABLET BY MOUTH DAILY. 30 tablet 2  . metFORMIN (GLUCOPHAGE) 500 MG tablet Take 1 tablet (500 mg total) by mouth 2  (two) times daily. 60 tablet 3  . methadone (METHADOSE) 40 MG disintegrating tablet Take 45 mg by mouth daily.    . traZODone (DESYREL) 50 MG tablet Take 0.5-1 tablets (25-50 mg total) by mouth at bedtime as needed for sleep. 30 tablet 0  . TRUEPLUS LANCETS 28G MISC Check blood sugar TID & QHS 100 each 2   No current facility-administered medications on file prior to visit.     No Known Allergies  Social History   Social History  . Marital status: Widowed    Spouse name: N/A  . Number of children: N/A  . Years of education: N/A   Occupational History  . Not on file.   Social History Main Topics  . Smoking status: Former Smoker    Packs/day: 0.25    Years: 20.00    Types: Cigarettes    Quit date: 01/18/2015  . Smokeless tobacco: Never Used  . Alcohol use Yes     Comment: 01/20/2015 "dank some alcohol on my birthday, cookouts,  etc,;  maybe once/month"  . Drug use: No  . Sexual activity: Not Currently    Birth control/ protection: None   Other Topics Concern  . Not on file   Social History Narrative  . No narrative on file    Family History  Problem Relation Age of Onset  . Asthma Mother   . Asthma Daughter     Past Surgical History:  Procedure Laterality Date  . CESAREAN SECTION  1992    ROS: Review of Systems  PHYSICAL EXAM: BP 110/80   Pulse 65   Temp 98.4 F (36.9 C) (Oral)   Resp 16   Wt (!) 340 lb 9.6 oz (154.5 kg)   SpO2 95%   BMI 59.39 kg/m   Repeat BP with large cuff 110/80 Wt Readings from Last 3 Encounters:  04/09/17 (!) 340 lb 9.6 oz (154.5 kg)  03/08/17 (!) 348 lb (157.9 kg)  12/28/16 (!) 340 lb (154.2 kg)    Physical Exam General appearance - alert, well appearing, Obese African-American female and in no distress Mental status - alert, oriented to person, place, and time, normal mood, behavior, speech, dress, motor activity, and thought processes Neck - supple, no significant adenopathy Chest - mild diffuse audible wheezing  bilaterally. No crackles Heart - regular rate and rhythm no gallops or murmurs heard Extremities - trace to 1+ lower extremity edema. Edema in right leg greater than left MSK - left knee: Large body habitus. Mild tenderness medially. Mild discomfort with passive range of motion with some crepitus felt.  Depression screen [facility] 2/9 12/20/2016  Decreased Interest 2  Down, Depressed, Hopeless 1  PHQ - 2 Score 3  Altered sleeping 0  Tired, decreased energy 2  Change in appetite 0  Feeling bad or failure about yourself  0  Trouble concentrating 0  Moving slowly or fidgety/restless 3  Suicidal thoughts 0  PHQ-9 Score 8   Results for orders placed or performed in visit on 04/09/17  POCT glucose (manual entry)  Result Value Ref Range   POC Glucose 104 (A) 70 - 99 mg/dl   ASSESSMENT AND PLAN: 1. Controlled type 2 diabetes mellitus with diabetic polyneuropathy, without long-term current use of insulin (HCC) -Point-of-care A1c test not ready for use today so we will do it as a blood draw. Continue metformin. - POCT glucose (manual entry) - Hemoglobin A1c  2. Primary osteoarthritis of left knee -Discussed the importance of weight loss through healthy eating habits and regular exercise. Commended her on weight loss so far. -Continue Lopid. Add Tylenol to use up to 3 times a day when necessary - acetaminophen (TYLENOL) 325 MG tablet; Take 2 tablets (650 mg total) by mouth every 8 (eight) hours.  Dispense: 60 tablet; Refill: 4  3. Class 3 obesity with body mass index (BMI) of 50.0 to 59.9 in adult, unspecified obesity type, unspecified whether serious comorbidity present ([place]) -I spent quite a bit of time discussing and recommending dietary changes. Patient to use Splenda 0-calorie sweetener in her coffee. Advised against drinking regular sodas and fruit juices. Patient to limit consumption of white carbohydrates. Discussed healthier options when she eats out.  4. Microcytic anemia -Continue  iron  5. Moderate persistent asthma without complication -Encourage her to get the albuterol and Advair filled today. Discussed the difference between rescue inhaler and maintenance inhaler. - albuterol (PROVENTIL HFA;VENTOLIN HFA) 108 (90 Base) MCG/ACT inhaler; Inhale 2 puffs into the lungs every 6 (six) hours as needed for wheezing or shortness of breath.  Dispense: 3 Inhaler; Refill: 5  6. Bilateral leg edema - furosemide (LASIX) 20 MG tablet; Take 1 tablet (20 mg total) by mouth daily. For swelling in legs  Dispense: 30 tablet; Refill: 3  7. Uterine leiomyoma, unspecified location - Ambulatory referral to Gynecology.  I will request that appointment be made for August as patient will have insurance at that time.  Patient was given the opportunity to ask questions.  Patient verbalized understanding of the plan and was able to repeat key elements of the plan.   Orders Placed This Encounter  Procedures  . Hemoglobin A1c  . Ambulatory referral to Gynecology  . POCT glucose (manual entry)     Requested Prescriptions   Signed Prescriptions Disp Refills  . albuterol (PROVENTIL HFA;VENTOLIN HFA) 108 (90 Base) MCG/ACT inhaler 3 Inhaler 5    Sig: Inhale 2 puffs into the lungs every 6 (six) hours as needed for wheezing or shortness of breath.  Marland Kitchen acetaminophen (TYLENOL) 325 MG tablet 60 tablet 4    Sig: Take 2 tablets (650 mg total) by mouth every 8 (eight) hours.  . furosemide (LASIX) 20 MG tablet 30 tablet 3    Sig: Take 1 tablet (20 mg total) by mouth daily. For swelling in legs    Return in about 3 months (around 07/10/2017).  Karle Plumber, MD, FACP

## 2017-04-09 NOTE — Patient Instructions (Addendum)
  Start Furosemide 20 mg daily for lower extremity edema.  Try to limit salt in the foods. Take Tylenol 650 mg three times a day as needed for knee pain.  Continue to take the Meloxicam. Please pick up the Albuterol and Advair inhaler.   Follow a Healthy Eating Plan - You can do it! Limit sugary drinks.  Avoid sodas, sweet tea, sport or energy drinks, or fruit drinks.  Drink water, lo-fat milk, or diet drinks. Limit snack foods.   Cut back on candy, cake, cookies, chips, ice cream.  These are a special treat, only in small amounts. Eat plenty of vegetables.  Especially dark green, red, and orange vegetables. Aim for at least 3 servings a day. More is better! Include fruit in your daily diet.  Whole fruit is much healthier than fruit juice! Limit "white" bread, "white" pasta, "white" rice.   Choose "100% whole grain" products, brown or wild rice. Avoid fatty meats. Try "Meatless Monday" and choose eggs or beans one day a week.  When eating meat, choose lean meats like chicken, [place], and fish.  Grill, broil, or bake meats instead of frying, and eat poultry without the skin. Eat less salt.  Avoid frozen pizzas, frozen dinners and salty foods.  Use seasonings other than salt in cooking.  This can help blood pressure and keep you from swelling Beer, wine and liquor have calories.  If you can safely drink alcohol, limit to 1 drink per day for women, 2 drinks for men

## 2017-04-10 LAB — HEMOGLOBIN A1C
Est. average glucose Bld gHb Est-mCnc: 126 mg/dL
Hgb A1c MFr Bld: 6 % — ABNORMAL HIGH (ref 4.8–5.6)

## 2017-04-11 MED FILL — metFORMIN HCL 500 MG TABS: 500 | 30 days supply | Qty: 60 | Fill #2

## 2017-04-12 MED FILL — MEGESTROL 40 MG TABLET: 40 | 30 days supply | Qty: 90 | Fill #3

## 2017-04-29 MED FILL — ?MELOXICAM 15 MG TABLET: 15 MG | 30 days supply | Qty: 30 | Fill #1

## 2017-05-17 ENCOUNTER — Ambulatory Visit: Payer: Self-pay | Attending: Internal Medicine | Admitting: Internal Medicine

## 2017-05-17 ENCOUNTER — Encounter: Payer: Self-pay | Admitting: Internal Medicine

## 2017-05-17 VITALS — BP 138/81 | HR 65 | Temp 98.4°F | Resp 16 | Wt 335.8 lb

## 2017-05-17 DIAGNOSIS — N938 Other specified abnormal uterine and vaginal bleeding: Secondary | ICD-10-CM | POA: Insufficient documentation

## 2017-05-17 DIAGNOSIS — D509 Iron deficiency anemia, unspecified: Secondary | ICD-10-CM | POA: Insufficient documentation

## 2017-05-17 DIAGNOSIS — G4700 Insomnia, unspecified: Secondary | ICD-10-CM | POA: Insufficient documentation

## 2017-05-17 DIAGNOSIS — E1142 Type 2 diabetes mellitus with diabetic polyneuropathy: Secondary | ICD-10-CM | POA: Insufficient documentation

## 2017-05-17 DIAGNOSIS — Z9889 Other specified postprocedural states: Secondary | ICD-10-CM | POA: Insufficient documentation

## 2017-05-17 DIAGNOSIS — J449 Chronic obstructive pulmonary disease, unspecified: Secondary | ICD-10-CM | POA: Insufficient documentation

## 2017-05-17 DIAGNOSIS — J4540 Moderate persistent asthma, uncomplicated: Secondary | ICD-10-CM | POA: Insufficient documentation

## 2017-05-17 DIAGNOSIS — M1712 Unilateral primary osteoarthritis, left knee: Secondary | ICD-10-CM | POA: Insufficient documentation

## 2017-05-17 DIAGNOSIS — Z87891 Personal history of nicotine dependence: Secondary | ICD-10-CM | POA: Insufficient documentation

## 2017-05-17 DIAGNOSIS — Z7984 Long term (current) use of oral hypoglycemic drugs: Secondary | ICD-10-CM | POA: Insufficient documentation

## 2017-05-17 DIAGNOSIS — Z825 Family history of asthma and other chronic lower respiratory diseases: Secondary | ICD-10-CM | POA: Insufficient documentation

## 2017-05-17 DIAGNOSIS — Z7951 Long term (current) use of inhaled steroids: Secondary | ICD-10-CM | POA: Insufficient documentation

## 2017-05-17 DIAGNOSIS — B351 Tinea unguium: Secondary | ICD-10-CM | POA: Insufficient documentation

## 2017-05-17 DIAGNOSIS — M25562 Pain in left knee: Secondary | ICD-10-CM | POA: Insufficient documentation

## 2017-05-17 DIAGNOSIS — Z79899 Other long term (current) drug therapy: Secondary | ICD-10-CM | POA: Insufficient documentation

## 2017-05-17 LAB — GLUCOSE, POCT (MANUAL RESULT ENTRY): POC Glucose: 109 mg/dl — AB (ref 70–99)

## 2017-05-17 MED ORDER — DULOXETINE HCL 30 MG PO CPEP: 30.0000 mg | ORAL_CAPSULE | Freq: Every day | ORAL | 3 refills | Status: DC

## 2017-05-17 MED ORDER — MEGESTROL ACETATE 40 MG PO TABS: 40.0000 mg | ORAL_TABLET | Freq: Two times a day (BID) | ORAL | 1 refills | Status: DC

## 2017-05-17 MED ORDER — FLUTICASONE-SALMETEROL 250-50 MCG/DOSE IN AEPB: 1.0000 | INHALATION_SPRAY | Freq: Two times a day (BID) | RESPIRATORY_TRACT | 4 refills | Status: DC

## 2017-05-17 MED FILL — ?DULOXETINE HCL DR 30 MG CA: 30 MG | 30 days supply | Qty: 30 | Fill #0

## 2017-05-17 MED FILL — MEGESTROL 40 MG TABLET: 40 | 30 days supply | Qty: 120 | Fill #0

## 2017-05-17 NOTE — Patient Instructions (Signed)

## 2017-05-17 NOTE — Progress Notes (Signed)
Patient ID: Hannah Baldwin, female    DOB: [birth year]  MRN: [id number]  CC: Leg Pain   Subjective: Hannah Baldwin is a 46 y.o. female who presents for UC visit. 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 Last seen 04/09/2017.  1. C/O a lot of pain in LT knee. On last visit we added Tylenol to Mobic but this is not holding her Gets depress because she likes to keep going and this knee is slowing her down.  Works as a Quarry manager -was hoping to [employer] with current job so she can go back to ortho for inj but she will not be able to get until fall of this yr. -swelling in legs less with low dose lasix  2. Needing RF on Megace until she gets insurance and can get back to GYN. With out it "I hemorrhage."  3. Asthma: did not get the Advair as yet. Said she received call from our pharmacy about it  Patient Active Problem List   Diagnosis Date Noted  . Primary osteoarthritis of left knee 04/09/2017  . Insomnia 11/03/2015  . Onychomycosis of toenail 07/25/2015  . Diabetic neuropathy ([place]) 07/25/2015  . DUB (dysfunctional uterine bleeding) 02/03/2015  . Diabetes type 2, controlled ([place]) 01/25/2015  . Severe obesity (BMI >= 40) ([place]) 01/25/2015  . Moderate persistent asthma 01/21/2015  . COPD (chronic obstructive pulmonary disease) ([place]) 01/20/2015  . Cardiomegaly 06/09/2014  . Hypochromic microcytic anemia 06/08/2014  . Former smoker 01/01/2013     Current Outpatient Prescriptions on File Prior to Visit  Medication Sig Dispense Refill  . acetaminophen (TYLENOL) 325 MG tablet Take 2 tablets (650 mg total) by mouth every 8 (eight) hours. 60 tablet 4  . albuterol (PROVENTIL HFA;VENTOLIN HFA) 108 (90 Base) MCG/ACT inhaler Inhale 2 puffs into the lungs every 6 (six) hours as needed for wheezing or shortness of breath.  3 Inhaler 5  . albuterol (PROVENTIL) (2.5 MG/3ML) 0.083% nebulizer solution Take 2.5 mg by nebulization every 6 (six) hours as needed for wheezing or shortness of breath.    . blood glucose meter kit and supplies KIT Dispense based on patient and insurance preference. Use up to four times daily as directed. (FOR ICD-9 250.00, 250.01). 1 each 0  . Blood Glucose Monitoring Suppl (TRUE METRIX METER) W/DEVICE KIT USE AS INSTRUCTED 1 kit 0  . ferrous sulfate 325 (65 FE) MG tablet Take 1 tablet (325 mg total) by mouth 2 (two) times daily with a meal. 60 tablet 1  . furosemide (LASIX) 20 MG tablet Take 1 tablet (20 mg total) by mouth daily. For swelling in legs 30 tablet 3  . gabapentin (NEURONTIN) 100 MG capsule Take 1-3 capsules (100-300 mg total) by mouth at bedtime. 90 capsule 0  . glucose blood (TRUE METRIX BLOOD GLUCOSE TEST) test strip 1 each by Other route daily. Use as instructed 100 each 12  . Lancets (FREESTYLE) lancets Use as instructed 100 each 12  . meloxicam (MOBIC) 15 MG tablet TAKE 1 TABLET BY MOUTH DAILY. 30 tablet 2  . metFORMIN (GLUCOPHAGE) 500 MG tablet Take 1 tablet (500 mg total) by mouth 2 (two) times daily. 60 tablet 3  . methadone (METHADOSE) 40 MG disintegrating tablet Take 45 mg by mouth daily.    . traZODone (DESYREL) 50 MG tablet Take 0.5-1 tablets (25-50 mg total) by  mouth at bedtime as needed for sleep. 30 tablet 0  . TRUEPLUS LANCETS 28G MISC Check blood sugar TID & QHS 100 each 2   No current facility-administered medications on file prior to visit.     No Known Allergies  Social History   Social History  . Marital status: Widowed    Spouse name: N/A  . Number of children: N/A  . Years of education: N/A   Occupational History  . Not on file.   Social History Main Topics  . Smoking status: Former Smoker    Packs/day: 0.25    Years: 20.00    Types: Cigarettes    Quit date: 01/18/2015  . Smokeless tobacco: Never Used  . Alcohol use Yes     Comment:  01/20/2015 "dank some alcohol on my birthday, cookouts,  etc,;  maybe once/month"  . Drug use: No  . Sexual activity: Not Currently    Birth control/ protection: None   Other Topics Concern  . Not on file   Social History Narrative  . No narrative on file    Family History  Problem Relation Age of Onset  . Asthma Mother   . Asthma Daughter     Past Surgical History:  Procedure Laterality Date  . CESAREAN SECTION  1992    ROS: Review of Systems Neg except as stated above  PHYSICAL EXAM: BP 138/81   Pulse 65   Temp 98.4 F (36.9 C) (Oral)   Resp 16   Wt (!) 335 lb 12.8 oz (152.3 kg)   SpO2 96%   BMI 58.55 kg/m   Physical Exam General appearance - alert, well appearing, obese female and in no distress Mental status - alert, oriented to person, place, and time, normal mood, behavior, speech, dress, motor activity, and thought processes Chest -mild diffuse wheezing Extremities -trace LE edema  Results for orders placed or performed in visit on 05/17/17  POCT glucose (manual entry)  Result Value Ref Range   POC Glucose 109 (A) 70 - 99 mg/dl    ASSESSMENT AND PLAN: 1. Primary osteoarthritis of left knee -Add Cymbalta.  opioids as last resort Advise to sign up for [facility] card/[facility] discount so we can refer back to ortho for injection - DULoxetine (CYMBALTA) 30 MG capsule; Take 1 capsule (30 mg total) by mouth daily.  Dispense: 30 capsule; Refill: 3  2. DUB (dysfunctional uterine bleeding) - megestrol (MEGACE) 40 MG tablet; Take 1-2 tablets (40-80 mg total) by mouth 2 (two) times daily.  Dispense: 120 tablet; Refill: 1  3. Moderate persistent asthma without complication Plans to pick up the Advair today - Fluticasone-Salmeterol (ADVAIR) 250-50 MCG/DOSE AEPB; Inhale 1 puff into the lungs 2 (two) times daily.  Dispense: 60 each; Refill: 4  4. Controlled type 2 diabetes mellitus with diabetic polyneuropathy, without long-term current use of insulin (HCC) - POCT  glucose (manual entry)  Declined pap today.  "I'm not mentally ready for that."  Patient was given the opportunity to ask questions.  Patient verbalized understanding of the plan and was able to repeat key elements of the plan.   Orders Placed This Encounter  Procedures  . POCT glucose (manual entry)     Requested Prescriptions   Signed Prescriptions Disp Refills  . DULoxetine (CYMBALTA) 30 MG capsule 30 capsule 3    Sig: Take 1 capsule (30 mg total) by mouth daily.  . megestrol (MEGACE) 40 MG tablet 120 tablet 1    Sig: Take 1-2 tablets (40-80 mg total)  by mouth 2 (two) times daily.  . Fluticasone-Salmeterol (ADVAIR) 250-50 MCG/DOSE AEPB 60 each 4    Sig: Inhale 1 puff into the lungs 2 (two) times daily.    No Follow-up on file.  Karle Plumber, MD, FACP

## 2017-05-20 ENCOUNTER — Ambulatory Visit: Payer: Self-pay | Admitting: Obstetrics and Gynecology

## 2017-05-20 MED FILL — **ADVAIR 250/50 DISKUS: 250-50 MCG | 7 days supply | Qty: 14 | Fill #0

## 2017-06-26 ENCOUNTER — Other Ambulatory Visit: Payer: Self-pay | Admitting: Physician Assistant

## 2017-06-26 DIAGNOSIS — E1142 Type 2 diabetes mellitus with diabetic polyneuropathy: Secondary | ICD-10-CM

## 2017-06-26 MED FILL — metFORMIN HCL 500 MG TABS: 500 | 30 days supply | Qty: 60 | Fill #3

## 2017-06-26 MED FILL — FERROUS SULFATE 325 MG TAB: 325 (65 FE) | 30 days supply | Qty: 60 | Fill #1

## 2017-07-12 ENCOUNTER — Encounter: Payer: Self-pay | Admitting: Internal Medicine

## 2017-07-12 ENCOUNTER — Ambulatory Visit: Payer: Self-pay | Attending: Internal Medicine | Admitting: Internal Medicine

## 2017-07-12 VITALS — BP 126/72 | HR 66 | Temp 98.6°F | Resp 16 | Wt 326.6 lb

## 2017-07-12 DIAGNOSIS — Z7984 Long term (current) use of oral hypoglycemic drugs: Secondary | ICD-10-CM | POA: Insufficient documentation

## 2017-07-12 DIAGNOSIS — Z1331 Encounter for screening for depression: Secondary | ICD-10-CM

## 2017-07-12 DIAGNOSIS — J4540 Moderate persistent asthma, uncomplicated: Secondary | ICD-10-CM | POA: Insufficient documentation

## 2017-07-12 DIAGNOSIS — E1142 Type 2 diabetes mellitus with diabetic polyneuropathy: Secondary | ICD-10-CM | POA: Insufficient documentation

## 2017-07-12 DIAGNOSIS — Z5329 Procedure and treatment not carried out because of patient's decision for other reasons: Secondary | ICD-10-CM | POA: Insufficient documentation

## 2017-07-12 DIAGNOSIS — F1120 Opioid dependence, uncomplicated: Secondary | ICD-10-CM | POA: Insufficient documentation

## 2017-07-12 DIAGNOSIS — J449 Chronic obstructive pulmonary disease, unspecified: Secondary | ICD-10-CM | POA: Insufficient documentation

## 2017-07-12 DIAGNOSIS — M1712 Unilateral primary osteoarthritis, left knee: Secondary | ICD-10-CM | POA: Insufficient documentation

## 2017-07-12 DIAGNOSIS — R5383 Other fatigue: Secondary | ICD-10-CM | POA: Insufficient documentation

## 2017-07-12 DIAGNOSIS — N938 Other specified abnormal uterine and vaginal bleeding: Secondary | ICD-10-CM | POA: Insufficient documentation

## 2017-07-12 DIAGNOSIS — Z2821 Immunization not carried out because of patient refusal: Secondary | ICD-10-CM

## 2017-07-12 DIAGNOSIS — F329 Major depressive disorder, single episode, unspecified: Secondary | ICD-10-CM | POA: Insufficient documentation

## 2017-07-12 DIAGNOSIS — Z87891 Personal history of nicotine dependence: Secondary | ICD-10-CM | POA: Insufficient documentation

## 2017-07-12 DIAGNOSIS — D509 Iron deficiency anemia, unspecified: Secondary | ICD-10-CM | POA: Insufficient documentation

## 2017-07-12 DIAGNOSIS — Z79899 Other long term (current) drug therapy: Secondary | ICD-10-CM | POA: Insufficient documentation

## 2017-07-12 DIAGNOSIS — Z6841 Body Mass Index (BMI) 40.0 and over, adult: Secondary | ICD-10-CM | POA: Insufficient documentation

## 2017-07-12 LAB — GLUCOSE, POCT (MANUAL RESULT ENTRY): POC Glucose: 83 mg/dl (ref 70–99)

## 2017-07-12 NOTE — Patient Instructions (Signed)
Restart Cymbalta. You will be referred to orthopedics and gynecology.

## 2017-07-12 NOTE — Progress Notes (Signed)
Patient ID: Hannah Baldwin, female    DOB: [birth year]  MRN: [id number]  CC: Follow-up and Fatigue   Subjective: Hannah Baldwin is a 46 y.o. 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 Last seen 04/09/2017.  1. Feel exhausted all the time -taking the iron "but I won't say I don't skip a day." Taking once a day instead of BID -does not snore  TSH was normal several mths  2. LT knee pain:  -still a lot of pain especially at nights -she had stopped the Cymbalta x 1 mth because she thought it was causing the feeling of being exhausted  However tiredness continued -will have insurance through her job in 2 wks -loss 14 lbs since July. Appetite down.   3. DUB:  -intermittent bleeding for past 3 wks but "nothing like I was before." -taking Megace -she missed the GYN appt in July due to lack of insurance.  -no blood in stools  4. DM: -needs new glucometer -taking Metformin as prescribed. -Tries to stay as active as her knee would allow  5. + dep screen: depress about her leg.  "My leg is slowing me down. Prevents her from doing certain things."  No SI  6. Asthma: not sure how to use the Advair inhaler  Patient Active Problem List   Diagnosis Date Noted  . Primary osteoarthritis of left knee 04/09/2017  . Insomnia 11/03/2015  . Onychomycosis of toenail 07/25/2015  . Diabetic neuropathy ([place]) 07/25/2015  . DUB (dysfunctional uterine bleeding) 02/03/2015  . Diabetes type 2, controlled ([place]) 01/25/2015  . Severe obesity (BMI >= 40) ([place]) 01/25/2015  . Moderate persistent asthma 01/21/2015  . COPD (chronic obstructive pulmonary disease) ([place]) 01/20/2015  . Cardiomegaly 06/09/2014  . Hypochromic microcytic anemia 06/08/2014  . Former smoker 01/01/2013      Current Outpatient Prescriptions on File Prior to Visit  Medication Sig Dispense Refill  . acetaminophen (TYLENOL) 325 MG tablet Take 2 tablets (650 mg total) by mouth every 8 (eight) hours. 60 tablet 4  . albuterol (PROVENTIL HFA;VENTOLIN HFA) 108 (90 Base) MCG/ACT inhaler Inhale 2 puffs into the lungs every 6 (six) hours as needed for wheezing or shortness of breath. 3 Inhaler 5  . albuterol (PROVENTIL) (2.5 MG/3ML) 0.083% nebulizer solution Take 2.5 mg by nebulization every 6 (six) hours as needed for wheezing or shortness of breath.    . blood glucose meter kit and supplies KIT Dispense based on patient and insurance preference. Use up to four times daily as directed. (FOR ICD-9 250.00, 250.01). 1 each 0  . Blood Glucose Monitoring Suppl (TRUE METRIX METER) W/DEVICE KIT USE AS INSTRUCTED 1 kit 0  . DULoxetine (CYMBALTA) 30 MG capsule Take 1 capsule (30 mg total) by mouth daily. 30 capsule 3  . ferrous sulfate 325 (65 FE) MG tablet Take 1 tablet (325 mg total) by mouth 2 (two) times daily with a meal. 60 tablet 1  . Fluticasone-Salmeterol (ADVAIR) 250-50 MCG/DOSE AEPB Inhale 1 puff into the lungs 2 (two) times daily. 60 each 4  . furosemide (LASIX) 20 MG tablet Take 1 tablet (20 mg total) by mouth daily. For swelling in legs 30 tablet 3  . gabapentin (NEURONTIN) 100 MG capsule Take 1-3 capsules (100-300 mg total) by mouth at bedtime. 90 capsule 0  . glucose blood (  TRUE METRIX BLOOD GLUCOSE TEST) test strip 1 each by Other route daily. Use as instructed 100 each 12  . Lancets (FREESTYLE) lancets Use as instructed 100 each 12  . megestrol (MEGACE) 40 MG tablet Take 1-2 tablets (40-80 mg total) by mouth 2 (two) times daily. 120 tablet 1  . meloxicam (MOBIC) 15 MG tablet TAKE 1 TABLET BY MOUTH DAILY. 30 tablet 2  . metFORMIN (GLUCOPHAGE) 500 MG tablet Take 1 tablet (500 mg total) by mouth 2 (two) times daily. 60 tablet 3  . methadone (METHADOSE) 40 MG disintegrating tablet Take 45 mg by mouth  daily.    . traZODone (DESYREL) 50 MG tablet Take 0.5-1 tablets (25-50 mg total) by mouth at bedtime as needed for sleep. 30 tablet 0  . TRUEPLUS LANCETS 28G MISC Check blood sugar TID & QHS 100 each 2   No current facility-administered medications on file prior to visit.     No Known Allergies  Social History   Social History  . Marital status: Widowed    Spouse name: N/A  . Number of children: N/A  . Years of education: N/A   Occupational History  . Not on file.   Social History Main Topics  . Smoking status: Former Smoker    Packs/day: 0.25    Years: 20.00    Types: Cigarettes    Quit date: 01/18/2015  . Smokeless tobacco: Never Used  . Alcohol use Yes     Comment: 01/20/2015 "dank some alcohol on my birthday, cookouts,  etc,;  maybe once/month"  . Drug use: No  . Sexual activity: Not Currently    Birth control/ protection: None   Other Topics Concern  . Not on file   Social History Narrative  . No narrative on file    Family History  Problem Relation Age of Onset  . Asthma Mother   . Asthma Daughter     Past Surgical History:  Procedure Laterality Date  . CESAREAN SECTION  1992    ROS: Review of Systems Negative except as stated above  PHYSICAL EXAM: BP 126/72   Pulse 66   Temp 98.6 F (37 C) (Oral)   Resp 16   Wt (!) 326 lb 9.6 oz (148.1 kg)   SpO2 96%   BMI 56.95 kg/m   Wt Readings from Last 3 Encounters:  07/12/17 (!) 326 lb 9.6 oz (148.1 kg)  05/17/17 (!) 335 lb 12.8 oz (152.3 kg)  04/09/17 (!) 340 lb 9.6 oz (154.5 kg)    Physical Exam General appearance - alert, well appearing, morbidly obese female and in no distress Mental status - alert, oriented to person, place, and time, normal mood, behavior, speech, dress, motor activity, and thought processes Neck - supple, no significant adenopathy Eye - pale conjunctiva Chest - clear to auscultation, no wheezes, rales or rhonchi, symmetric air entry Heart - normal rate, regular rhythm,  normal S1, S2, no murmurs, rubs, clicks or gallops Musculoskeletal -LT knee: Large body habitus. No point tenderness. Mild to moderate discomfort on passive range of motion Extremities - trace LE edema  Results for orders placed or performed in visit on 07/12/17  POCT glucose (manual entry)  Result Value Ref Range   POC Glucose 83 70 - 99 mg/dl   Lab Results  Component Value Date   HGBA1C 6.0 (H) 04/09/2017     ASSESSMENT AND PLAN: 1. Controlled type 2 diabetes mellitus with diabetic polyneuropathy, without long-term current use of insulin (HCC) -Continue metformin Encourage her to  continue limiting portion sizes and trying to stay active - POCT glucose (manual entry)  2. Other fatigue -Likely due to anemia. Recheck CBC today. Continue iron - CBC  3. Primary osteoarthritis of left knee -pt to restart Cymbalta - AMB referral to orthopedics  4. DUB (dysfunctional uterine bleeding) She will have insurance in 2 wks and is agreeable to GYN referral. Needs PAP but currently bleeding so not done today. Can have done when she sees GYN - Ambulatory referral to Gynecology  5. Moderate persistent asthma without complication Clinical pharmacists met with patient today to show her proper use of Advair inhaler  6. Positive depression screening Restart Cymbalta  7. Influenza vaccination declined   8. Morbid obesity ([place]) Commended her on weight loss so far. Encouraged to keep up the good work   Patient was given the opportunity to ask questions.  Patient verbalized understanding of the plan and was able to repeat key elements of the plan.   Orders Placed This Encounter  Procedures  . CBC  . Ambulatory referral to Gynecology  . AMB referral to orthopedics  . POCT glucose (manual entry)     Requested Prescriptions    No prescriptions requested or ordered in this encounter    Return in about 3 months (around 10/12/2017).  Karle Plumber, MD, FACP

## 2017-07-15 ENCOUNTER — Ambulatory Visit: Payer: Self-pay | Attending: Internal Medicine

## 2017-07-15 DIAGNOSIS — R5383 Other fatigue: Secondary | ICD-10-CM | POA: Insufficient documentation

## 2017-07-15 NOTE — Progress Notes (Signed)
Patient here for lab visit  

## 2017-07-16 LAB — CBC
Hematocrit: 33.3 % — ABNORMAL LOW (ref 34.0–46.6)
Hemoglobin: 9.9 g/dL — ABNORMAL LOW (ref 11.1–15.9)
MCH: 23.1 pg — ABNORMAL LOW (ref 26.6–33.0)
MCHC: 29.7 g/dL — ABNORMAL LOW (ref 31.5–35.7)
MCV: 78 fL — ABNORMAL LOW (ref 79–97)
PLATELETS: 457 10*3/uL — AB (ref 150–379)
RBC: 4.28 x10E6/uL (ref 3.77–5.28)
RDW: 17.3 % — AB (ref 12.3–15.4)
WBC: 7.8 10*3/uL (ref 3.4–10.8)

## 2017-07-18 ENCOUNTER — Telehealth: Payer: Self-pay

## 2017-07-18 NOTE — Telephone Encounter (Signed)
Contacted pt to go over lab results pt is aware of results and doesn't have any questions or concerns 

## 2017-07-23 MED FILL — ?DULOXETINE HCL DR 30 MG CA: 30 MG | 30 days supply | Qty: 30 | Fill #1

## 2017-07-26 MED FILL — MEGESTROL 40 MG TABLET: 40 | 30 days supply | Qty: 120 | Fill #1

## 2017-08-19 ENCOUNTER — Ambulatory Visit: Payer: Self-pay

## 2017-08-26 ENCOUNTER — Ambulatory Visit: Payer: Self-pay

## 2017-08-28 ENCOUNTER — Other Ambulatory Visit: Payer: Self-pay | Admitting: Physician Assistant

## 2017-08-28 DIAGNOSIS — E1142 Type 2 diabetes mellitus with diabetic polyneuropathy: Secondary | ICD-10-CM

## 2017-08-28 MED FILL — ?DULOXETINE HCL DR 30 MG CA: 30 MG | 30 days supply | Qty: 30 | Fill #2

## 2017-08-28 MED FILL — ?METFORMIN HCL 500MG TABLET: 500 | 30 days supply | Qty: 60 | Fill #0

## 2017-09-02 MED FILL — !ADVAIR 250/50 DISKUS: 250-50 | 30 days supply | Qty: 60 | Fill #1

## 2017-09-04 ENCOUNTER — Ambulatory Visit: Payer: Self-pay

## 2017-09-17 ENCOUNTER — Ambulatory Visit: Payer: Self-pay | Admitting: Internal Medicine

## 2017-09-23 ENCOUNTER — Other Ambulatory Visit: Payer: Self-pay | Admitting: Internal Medicine

## 2017-09-23 DIAGNOSIS — M1712 Unilateral primary osteoarthritis, left knee: Secondary | ICD-10-CM

## 2017-09-23 DIAGNOSIS — N938 Other specified abnormal uterine and vaginal bleeding: Secondary | ICD-10-CM

## 2017-09-23 MED FILL — ?DULOXETINE HCL DR 30 MG CA: 30 MG | 30 days supply | Qty: 30 | Fill #3

## 2017-09-24 ENCOUNTER — Ambulatory Visit: Payer: Self-pay | Attending: Internal Medicine | Admitting: Internal Medicine

## 2017-09-24 ENCOUNTER — Encounter: Payer: Self-pay | Admitting: Internal Medicine

## 2017-09-24 VITALS — BP 121/84 | HR 79 | Temp 98.2°F | Resp 16 | Wt 322.8 lb

## 2017-09-24 DIAGNOSIS — E1142 Type 2 diabetes mellitus with diabetic polyneuropathy: Secondary | ICD-10-CM | POA: Insufficient documentation

## 2017-09-24 DIAGNOSIS — N938 Other specified abnormal uterine and vaginal bleeding: Secondary | ICD-10-CM | POA: Insufficient documentation

## 2017-09-24 DIAGNOSIS — Z87891 Personal history of nicotine dependence: Secondary | ICD-10-CM | POA: Insufficient documentation

## 2017-09-24 DIAGNOSIS — M1712 Unilateral primary osteoarthritis, left knee: Secondary | ICD-10-CM | POA: Insufficient documentation

## 2017-09-24 DIAGNOSIS — Z6841 Body Mass Index (BMI) 40.0 and over, adult: Secondary | ICD-10-CM | POA: Insufficient documentation

## 2017-09-24 DIAGNOSIS — Z825 Family history of asthma and other chronic lower respiratory diseases: Secondary | ICD-10-CM | POA: Insufficient documentation

## 2017-09-24 DIAGNOSIS — J449 Chronic obstructive pulmonary disease, unspecified: Secondary | ICD-10-CM | POA: Insufficient documentation

## 2017-09-24 DIAGNOSIS — D509 Iron deficiency anemia, unspecified: Secondary | ICD-10-CM | POA: Insufficient documentation

## 2017-09-24 DIAGNOSIS — J4540 Moderate persistent asthma, uncomplicated: Secondary | ICD-10-CM | POA: Insufficient documentation

## 2017-09-24 DIAGNOSIS — G4700 Insomnia, unspecified: Secondary | ICD-10-CM | POA: Insufficient documentation

## 2017-09-24 DIAGNOSIS — Z9889 Other specified postprocedural states: Secondary | ICD-10-CM | POA: Insufficient documentation

## 2017-09-24 DIAGNOSIS — Z79899 Other long term (current) drug therapy: Secondary | ICD-10-CM | POA: Insufficient documentation

## 2017-09-24 DIAGNOSIS — Z7984 Long term (current) use of oral hypoglycemic drugs: Secondary | ICD-10-CM | POA: Insufficient documentation

## 2017-09-24 LAB — GLUCOSE, POCT (MANUAL RESULT ENTRY): POC GLUCOSE: 115 mg/dL — AB (ref 70–99)

## 2017-09-24 MED ORDER — GABAPENTIN 100 MG PO CAPS: 100.0000 mg | ORAL_CAPSULE | Freq: Every day | ORAL | 2 refills | Status: DC

## 2017-09-24 MED ORDER — TRUEPLUS LANCETS 28G MISC: 2 refills | Status: AC

## 2017-09-24 MED ORDER — GLUCOSE BLOOD VI STRP: ORAL_STRIP | 12 refills | Status: AC

## 2017-09-24 MED ORDER — TRUE METRIX METER W/DEVICE KIT: PACK | 0 refills | Status: AC

## 2017-09-24 MED ORDER — DULOXETINE HCL 60 MG PO CPEP: 60.0000 mg | ORAL_CAPSULE | Freq: Every day | ORAL | 6 refills | Status: DC

## 2017-09-24 MED FILL — TRUEplus LANCETS 28G MISC: 25 days supply | Qty: 100 | Fill #0

## 2017-09-24 MED FILL — TRUE METRIX TEST STRIP: 30 days supply | Qty: 100 | Fill #0

## 2017-09-24 MED FILL — !TRUE METRIX BLOOD GLUCOSE: 30 days supply | Qty: 1 | Fill #0

## 2017-09-24 MED FILL — GABAPENTIN 100 MG CAPSULE: 100 | 30 days supply | Qty: 90 | Fill #0

## 2017-09-24 MED FILL — MEGESTROL 40 MG TABLET: 40 | 30 days supply | Qty: 120 | Fill #0

## 2017-09-24 NOTE — Patient Instructions (Signed)
Arthritis:  Increase Cymbalta to 60 mg daily. Neuropathy:  Restart Gabapentin at bedtime.   Let me know once you have been approved for [employer].

## 2017-09-24 NOTE — Progress Notes (Signed)
Patient ID: Hannah Baldwin, female    DOB: 1971/08/08  MRN: [id number]  CC: Medication Refill and Foot Pain   Subjective: Hannah Baldwin is a 46 y.o. female who presents for chronic ds management. 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 Pos depression screen 07/2017.   1. OA:  "the leg medicine is helping me but I think we need to go up on it." -knee pops and hurts -loss additional 4 lbs since last visit -stays active as much as she can Did not get insurance as she had anticipated.  She is applying for [facility] and [facility] discount  2. Anemia/DUB -anemia was a little worse on last visit. Taking Iron consistently now -gets light bleeding several times a mth for 5-7 days.  She does not keep a calender  3. C/o needle like pain on sole of feet again LT>RT.  Was taking Gabapentin but stopped after symptoms got better. Thinks she needs to get back on the Gabapentin.  4.  DM/obesity: glucometer no longer works. -eating habits:  Eating less Compliant with Metformin  Patient Active Problem List   Diagnosis Date Noted  . Primary osteoarthritis of left knee 04/09/2017  . Insomnia 11/03/2015  . Onychomycosis of toenail 07/25/2015  . Diabetic neuropathy ([place]) 07/25/2015  . DUB (dysfunctional uterine bleeding) 02/03/2015  . Diabetes type 2, controlled ([place]) 01/25/2015  . Severe obesity (BMI >= 40) ([place]) 01/25/2015  . Moderate persistent asthma 01/21/2015  . COPD (chronic obstructive pulmonary disease) ([place]) 01/20/2015  . Cardiomegaly 06/09/2014  . Hypochromic microcytic anemia 06/08/2014  . Former smoker 01/01/2013     Current Outpatient Medications on File Prior to Visit  Medication Sig Dispense Refill  . acetaminophen (TYLENOL) 325 MG tablet Take 2 tablets (650 mg total) by mouth every 8  (eight) hours. 60 tablet 4  . albuterol (PROVENTIL HFA;VENTOLIN HFA) 108 (90 Base) MCG/ACT inhaler Inhale 2 puffs into the lungs every 6 (six) hours as needed for wheezing or shortness of breath. 3 Inhaler 5  . albuterol (PROVENTIL) (2.5 MG/3ML) 0.083% nebulizer solution Take 2.5 mg by nebulization every 6 (six) hours as needed for wheezing or shortness of breath.    . blood glucose meter kit and supplies KIT Dispense based on patient and insurance preference. Use up to four times daily as directed. (FOR ICD-9 250.00, 250.01). 1 each 0  . ferrous sulfate 325 (65 FE) MG tablet Take 1 tablet (325 mg total) by mouth 2 (two) times daily with a meal. 60 tablet 1  . Fluticasone-Salmeterol (ADVAIR) 250-50 MCG/DOSE AEPB Inhale 1 puff into the lungs 2 (two) times daily. 60 each 4  . furosemide (LASIX) 20 MG tablet Take 1 tablet (20 mg total) by mouth daily. For swelling in legs (Patient not taking: Reported on 09/24/2017) 30 tablet 3  . megestrol (MEGACE) 40 MG tablet TAKE 1-2 TABLETS (40-80 MG TOTAL) BY MOUTH 2 (TWO) TIMES DAILY. 120 tablet 1  . meloxicam (MOBIC) 15 MG tablet TAKE 1 TABLET BY MOUTH DAILY. (Patient not taking: Reported on 09/24/2017) 30 tablet 2  . metFORMIN (GLUCOPHAGE) 500 MG tablet TAKE 1 TABLET BY MOUTH 2 TIMES DAILY. 60 tablet 2  . methadone (METHADOSE) 40 MG disintegrating tablet Take 45 mg by mouth daily.    . traZODone (DESYREL) 50 MG tablet Take 0.5-1 tablets (25-50 mg total) by mouth  at bedtime as needed for sleep. (Patient not taking: Reported on 09/24/2017) 30 tablet 0   No current facility-administered medications on file prior to visit.     No Known Allergies  Social History   Socioeconomic History  . Marital status: Widowed    Spouse name: Not on file  . Number of children: Not on file  . Years of education: Not on file  . Highest education level: Not on file  Social Needs  . Financial resource strain: Not on file  . Food insecurity - worry: Not on file  . Food  insecurity - inability: Not on file  . Transportation needs - medical: Not on file  . Transportation needs - non-medical: Not on file  Occupational History  . Not on file  Tobacco Use  . Smoking status: Former Smoker    Packs/day: 0.25    Years: 20.00    Pack years: 5.00    Types: Cigarettes    Last attempt to quit: 01/18/2015    Years since quitting: 2.6  . Smokeless tobacco: Never Used  Substance and Sexual Activity  . Alcohol use: Yes    Comment: 01/20/2015 "dank some alcohol on my birthday, cookouts,  etc,;  maybe once/month"  . Drug use: No  . Sexual activity: Not Currently    Birth control/protection: None  Other Topics Concern  . Not on file  Social History Narrative  . Not on file    Family History  Problem Relation Age of Onset  . Asthma Mother   . Asthma Daughter     Past Surgical History:  Procedure Laterality Date  . CESAREAN SECTION  1992    ROS: Review of Systems Neg except as above PHYSICAL EXAM: BP 121/84   Pulse 79   Temp 98.2 F (36.8 C) (Oral)   Resp 16   Wt (!) 322 lb 12.8 oz (146.4 kg)   SpO2 96%   BMI 56.28 kg/m   Physical Exam General appearance - alert, well appearing, morbidly obese female and in no distress Mental status - alert, oriented to person, place, and time, normal mood, behavior, speech, dress, motor activity, and thought processes Neck - supple, no significant adenopathy Eye - pale conjunctiva Chest - clear to auscultation, no wheezes, rales or rhonchi, symmetric air entry Heart - normal rate, regular rhythm, normal S1, S2, no murmurs, rubs, clicks or gallops Musculoskeletal -LT knee: Large body habitus. No point tenderness. Mild to moderate discomfort on passive range of motion Extremities - no LE edema   Results for orders placed or performed in visit on 09/24/17  POCT glucose (manual entry)  Result Value Ref Range   POC Glucose 115 (A) 70 - 99 mg/dl    ASSESSMENT AND PLAN: 1. Primary osteoarthritis of left  knee Continue to encourage weight loss. Increase Cymbalta to 60 mg daily. Refer to orthopedics once she has been approved for [facility] discount - DULoxetine (CYMBALTA) 60 MG capsule; Take 1 capsule (60 mg total) by mouth daily.  Dispense: 30 capsule; Refill: 6  2. DUB (dysfunctional uterine bleeding) -Advised patient to keep a calendar of vaginal bleeding episodes.  She is on megestrol. Referred to GYN when she has been approved for [facility] discount  3. Diabetic polyneuropathy associated with type 2 diabetes mellitus (HCC) Restart gabapentin. Diabetic foot care discussed. - gabapentin (NEURONTIN) 100 MG capsule; Take 1-3 capsules (100-300 mg total) by mouth at bedtime.  Dispense: 90 capsule; Refill: 2  4. Morbid obesity ([place]) Commended her on weight  loss so far.  Encourage her to try to stay active.  Continue to limit portion sizes.  5. Controlled type 2 diabetes mellitus with diabetic polyneuropathy, without long-term current use of insulin (HCC) - gabapentin (NEURONTIN) 100 MG capsule; Take 1-3 capsules (100-300 mg total) by mouth at bedtime.  Dispense: 90 capsule; Refill: 2 - Blood Glucose Monitoring Suppl (TRUE METRIX METER) w/Device KIT; USE AS INSTRUCTED  Dispense: 1 kit; Refill: 0 - glucose blood (TRUE METRIX BLOOD GLUCOSE TEST) test strip; Use as instructed  Dispense: 100 each; Refill: 12 - TRUEPLUS LANCETS 28G MISC; Check blood sugar TID & QHS  Dispense: 100 each; Refill: 2 - POCT glucose (manual entry)  Patient was given the opportunity to ask questions.  Patient verbalized understanding of the plan and was able to repeat key elements of the plan.   Orders Placed This Encounter  Procedures  . POCT glucose (manual entry)     Requested Prescriptions   Signed Prescriptions Disp Refills  . gabapentin (NEURONTIN) 100 MG capsule 90 capsule 2    Sig: Take 1-3 capsules (100-300 mg total) by mouth at bedtime.  . DULoxetine (CYMBALTA) 60 MG capsule 30 capsule 6    Sig:  Take 1 capsule (60 mg total) by mouth daily.  . Blood Glucose Monitoring Suppl (TRUE METRIX METER) w/Device KIT 1 kit 0    Sig: USE AS INSTRUCTED  . glucose blood (TRUE METRIX BLOOD GLUCOSE TEST) test strip 100 each 12    Sig: Use as instructed  . TRUEPLUS LANCETS 28G MISC 100 each 2    Sig: Check blood sugar TID & QHS    Return in about 3 months (around 12/23/2017).  Karle Plumber, MD, FACP

## 2017-10-03 ENCOUNTER — Ambulatory Visit: Payer: Self-pay | Attending: Internal Medicine

## 2017-11-05 ENCOUNTER — Other Ambulatory Visit: Payer: Self-pay | Admitting: Internal Medicine

## 2017-11-05 DIAGNOSIS — M1712 Unilateral primary osteoarthritis, left knee: Secondary | ICD-10-CM

## 2017-11-12 MED FILL — ?METFORMIN HCL 500MG TABLET: 500 | 30 days supply | Qty: 60 | Fill #1

## 2017-11-12 MED FILL — GABAPENTIN 100 MG CAPSULE: 100 | 30 days supply | Qty: 90 | Fill #1

## 2017-11-15 ENCOUNTER — Other Ambulatory Visit: Payer: Self-pay | Admitting: Internal Medicine

## 2017-11-15 MED ORDER — MOMETASONE FURO-FORMOTEROL FUM 100-5 MCG/ACT IN AERO: 2.0000 | INHALATION_SPRAY | Freq: Two times a day (BID) | RESPIRATORY_TRACT | 6 refills | Status: DC

## 2017-11-15 MED FILL — DULERA 100 MCG/5 MCG INH: 100-5 | 30 days supply | Qty: 13 | Fill #0

## 2017-11-15 MED FILL — ?DULOXETINE HCL DR 60 MG CA: 60 MG | 30 days supply | Qty: 30 | Fill #0

## 2017-11-15 NOTE — Progress Notes (Signed)
Pt does not like the Advair disc.  Request change.

## 2017-11-18 ENCOUNTER — Ambulatory Visit: Payer: Self-pay | Attending: Internal Medicine | Admitting: Internal Medicine

## 2017-11-18 ENCOUNTER — Encounter: Payer: Self-pay | Admitting: Internal Medicine

## 2017-11-18 VITALS — BP 120/77 | HR 69 | Temp 98.4°F | Ht 63.5 in | Wt 330.0 lb

## 2017-11-18 DIAGNOSIS — J4540 Moderate persistent asthma, uncomplicated: Secondary | ICD-10-CM | POA: Insufficient documentation

## 2017-11-18 DIAGNOSIS — N938 Other specified abnormal uterine and vaginal bleeding: Secondary | ICD-10-CM | POA: Insufficient documentation

## 2017-11-18 DIAGNOSIS — Z6841 Body Mass Index (BMI) 40.0 and over, adult: Secondary | ICD-10-CM | POA: Insufficient documentation

## 2017-11-18 DIAGNOSIS — L304 Erythema intertrigo: Secondary | ICD-10-CM | POA: Insufficient documentation

## 2017-11-18 DIAGNOSIS — Z79899 Other long term (current) drug therapy: Secondary | ICD-10-CM | POA: Insufficient documentation

## 2017-11-18 DIAGNOSIS — D509 Iron deficiency anemia, unspecified: Secondary | ICD-10-CM | POA: Insufficient documentation

## 2017-11-18 DIAGNOSIS — R0609 Other forms of dyspnea: Secondary | ICD-10-CM

## 2017-11-18 DIAGNOSIS — Z7984 Long term (current) use of oral hypoglycemic drugs: Secondary | ICD-10-CM | POA: Insufficient documentation

## 2017-11-18 DIAGNOSIS — E1142 Type 2 diabetes mellitus with diabetic polyneuropathy: Secondary | ICD-10-CM | POA: Insufficient documentation

## 2017-11-18 DIAGNOSIS — Z87891 Personal history of nicotine dependence: Secondary | ICD-10-CM | POA: Insufficient documentation

## 2017-11-18 DIAGNOSIS — R0600 Dyspnea, unspecified: Secondary | ICD-10-CM | POA: Insufficient documentation

## 2017-11-18 DIAGNOSIS — J449 Chronic obstructive pulmonary disease, unspecified: Secondary | ICD-10-CM | POA: Insufficient documentation

## 2017-11-18 DIAGNOSIS — M1712 Unilateral primary osteoarthritis, left knee: Secondary | ICD-10-CM | POA: Insufficient documentation

## 2017-11-18 LAB — GLUCOSE, POCT (MANUAL RESULT ENTRY): POC GLUCOSE: 80 mg/dL (ref 70–99)

## 2017-11-18 MED ORDER — FERROUS SULFATE 325 (65 FE) MG PO TABS: 325.0000 mg | ORAL_TABLET | Freq: Two times a day (BID) | ORAL | 2 refills | Status: DC

## 2017-11-18 MED ORDER — NYSTATIN 100000 UNIT/GM EX CREA: TOPICAL_CREAM | CUTANEOUS | 0 refills | Status: DC

## 2017-11-18 MED FILL — ?NYSTATIN 100,000 UNIT/GM C: 100000 | 15 days supply | Qty: 30 | Fill #0

## 2017-11-18 NOTE — Progress Notes (Signed)
Patient ID: Hannah Baldwin, female    DOB: 01/29/1971  MRN: [id number]  CC: Leg Swelling; Diabetes; Rash ("right and left creases"); Shortness of Breath; and Fatigue   Subjective: Hannah Baldwin is a 47 y.o. female who presents for UC visit with several concerns.  Last seen 09/24/2017. Her concerns today include:  Patient with history of diabetes type 2 with neuropathy, obesity, mod persistent asthma, osteoarthritis of the left knee,iron deficiency anemia,DUB with fibroid, opioidaddictionon Methadone. Pos depression screen 07/2017.  1.  Approved for [facility] 100% and OC Would like to move forward with ortho referral for her knee  2. DUB/fibroid: would like referral to [facility] for f/u. Had bleeding for 4-5 days about 3 wks ago.  Some clotting.  Still on Megace  3.  Itchy rash in skin folds on legs RT>LT x few mths.  Reportedly given antifungal pill in the past which cleared it up immediately and requesting the same now.  She has tried over-the-counter creams and use of Vaseline without improvement.  4.  SOB and tired very easily. " I can take a shower and feel like I have ran a marathon." -no SOB at nights/sleeping -no LE edema -no dizziness.  No CP -Last echo done in 2015 revealed normal systolic function with EF of 60-65 percent Stopped taking Iron 2-3 mths ago.  No blood in stools -has asthma.  Does not like Advair.  Feels like nothing comes out when she sucks on it.  She requested a different maintenance inhaler from the pharmacist who sent me a message last Friday.  I sent prescription for [facility].  She plans to pick it up today.  Patient Active Problem List   Diagnosis Date Noted  . Primary osteoarthritis of left knee 04/09/2017  . Insomnia 11/03/2015  . Onychomycosis of toenail 07/25/2015  . Diabetic neuropathy ([place]) 07/25/2015  . DUB (dysfunctional uterine bleeding) 02/03/2015  . Diabetes type 2, controlled ([place]) 01/25/2015  . Severe obesity (BMI >= 40) ([place])  01/25/2015  . Moderate persistent asthma 01/21/2015  . COPD (chronic obstructive pulmonary disease) ([place]) 01/20/2015  . Cardiomegaly 06/09/2014  . Hypochromic microcytic anemia 06/08/2014  . Former smoker 01/01/2013     Current Outpatient Medications on File Prior to Visit  Medication Sig Dispense Refill  . acetaminophen (TYLENOL) 325 MG tablet Take 2 tablets (650 mg total) by mouth every 8 (eight) hours. 60 tablet 4  . albuterol (PROVENTIL HFA;VENTOLIN HFA) 108 (90 Base) MCG/ACT inhaler Inhale 2 puffs into the lungs every 6 (six) hours as needed for wheezing or shortness of breath. 3 Inhaler 5  . albuterol (PROVENTIL) (2.5 MG/3ML) 0.083% nebulizer solution Take 2.5 mg by nebulization every 6 (six) hours as needed for wheezing or shortness of breath.    . blood glucose meter kit and supplies KIT Dispense based on patient and insurance preference. Use up to four times daily as directed. (FOR ICD-9 250.00, 250.01). 1 each 0  . Blood Glucose Monitoring Suppl (TRUE METRIX METER) w/Device KIT USE AS INSTRUCTED 1 kit 0  . DULoxetine (CYMBALTA) 60 MG capsule Take 1 capsule (60 mg total) by mouth daily. 30 capsule 6  . furosemide (LASIX) 20 MG tablet Take 1 tablet (20 mg total) by mouth daily. For swelling in legs (Patient not taking: Reported on 09/24/2017) 30 tablet 3  . gabapentin (NEURONTIN) 100 MG capsule Take 1-3 capsules (100-300 mg total) by mouth at bedtime. 90 capsule 2  . glucose blood (TRUE METRIX BLOOD GLUCOSE TEST) test  strip Use as instructed 100 each 12  . megestrol (MEGACE) 40 MG tablet TAKE 1-2 TABLETS (40-80 MG TOTAL) BY MOUTH 2 (TWO) TIMES DAILY. 120 tablet 1  . meloxicam (MOBIC) 15 MG tablet TAKE 1 TABLET BY MOUTH DAILY. (Patient not taking: Reported on 09/24/2017) 30 tablet 2  . metFORMIN (GLUCOPHAGE) 500 MG tablet TAKE 1 TABLET BY MOUTH 2 TIMES DAILY. 60 tablet 2  . methadone (METHADOSE) 40 MG disintegrating tablet Take 45 mg by mouth daily.    . mometasone-formoterol (DULERA)  100-5 MCG/ACT AERO Inhale 2 puffs into the lungs 2 (two) times daily. 13 g 6  . traZODone (DESYREL) 50 MG tablet Take 0.5-1 tablets (25-50 mg total) by mouth at bedtime as needed for sleep. (Patient not taking: Reported on 09/24/2017) 30 tablet 0  . TRUEPLUS LANCETS 28G MISC Check blood sugar TID & QHS 100 each 2   No current facility-administered medications on file prior to visit.     No Known Allergies  Social History   Socioeconomic History  . Marital status: Widowed    Spouse name: Not on file  . Number of children: Not on file  . Years of education: Not on file  . Highest education level: Not on file  Social Needs  . Financial resource strain: Not on file  . Food insecurity - worry: Not on file  . Food insecurity - inability: Not on file  . Transportation needs - medical: Not on file  . Transportation needs - non-medical: Not on file  Occupational History  . Not on file  Tobacco Use  . Smoking status: Former Smoker    Packs/day: 0.25    Years: 20.00    Pack years: 5.00    Types: Cigarettes    Last attempt to quit: 01/18/2015    Years since quitting: 2.8  . Smokeless tobacco: Never Used  Substance and Sexual Activity  . Alcohol use: Yes    Comment: 01/20/2015 "dank some alcohol on my birthday, cookouts,  etc,;  maybe once/month"  . Drug use: No  . Sexual activity: Not Currently    Birth control/protection: None  Other Topics Concern  . Not on file  Social History Narrative  . Not on file    Family History  Problem Relation Age of Onset  . Asthma Mother   . Asthma Daughter     Past Surgical History:  Procedure Laterality Date  . CESAREAN SECTION  1992    ROS: Review of Systems Negative except as stated above PHYSICAL EXAM: BP 120/77 (BP Location: Right Arm, Patient Position: Sitting, Cuff Size: Large)   Pulse 69   Temp 98.4 F (36.9 C) (Oral)   Ht 5' 3.5" (1.613 m)   Wt (!) 330 lb (149.7 kg)   SpO2 98%   BMI 57.54 kg/m   Wt Readings from Last 3  Encounters:  11/18/17 (!) 330 lb (149.7 kg)  09/24/17 (!) 322 lb 12.8 oz (146.4 kg)  07/12/17 (!) 326 lb 9.6 oz (148.1 kg)    Physical Exam  General appearance - alert, well appearing, morbidly obese African-American female and in no distress.  Able to get on exam table due to knee pain Mental status - alert, oriented to person, place, and time, normal mood, behavior, speech, dress, motor activity, and thought processes Neck - supple, no significant adenopathy Chest -mild diffuse wheezing Heart - normal rate, regular rhythm, normal S1, S2, no murmurs, rubs, clicks or gallops Extremities - peripheral pulses normal, no pedal edema, no clubbing  or cyanosis Skin -hyperpigmented scaly rash between skin folds on right upper thigh.  Smaller area on left upper thigh  Results for orders placed or performed in visit on 11/18/17  POCT glucose (manual entry)  Result Value Ref Range   POC Glucose 80 70 - 99 mg/dl    ASSESSMENT AND PLAN: 1. Primary osteoarthritis of left knee - Ambulatory referral to Orthopedic Surgery  2. DUB (dysfunctional uterine bleeding) - Ambulatory referral to Gynecology  3. Controlled type 2 diabetes mellitus with diabetic polyneuropathy, without long-term current use of insulin (HCC) - POCT glucose (manual entry)  4. Intertrigo - nystatin cream (MYCOSTATIN); Apply to affected area BID  Dispense: 30 g; Refill: 0  5. Dyspnea on exertion Differential diagnoses includes anemia especially with the noncompliance with iron VS suboptimal control of asthma versus cardiac issue.  I suspect the first 2 as the most likely etiologies here. -We will recheck CBC today.  If still anemic she will restart iron and have encouraged her to take it every day.  Can take with vitamin C to help increase absorption -Check BNP level She will pick up Dulera inhaler today - CBC - Comprehensive metabolic panel - Brain natriuretic peptide  6. Moderate persistent asthma without  complication -see #5 above  7. Iron deficiency anemia, unspecified iron deficiency anemia type - ferrous sulfate 325 (65 FE) MG tablet; Take 1 tablet (325 mg total) by mouth 2 (two) times daily with a meal.  Dispense: 120 tablet; Refill: 2  Patient was given the opportunity to ask questions.  Patient verbalized understanding of the plan and was able to repeat key elements of the plan.   Orders Placed This Encounter  Procedures  . CBC  . Comprehensive metabolic panel  . Brain natriuretic peptide  . Ambulatory referral to Orthopedic Surgery  . Ambulatory referral to Gynecology  . POCT glucose (manual entry)     Requested Prescriptions   Signed Prescriptions Disp Refills  . ferrous sulfate 325 (65 FE) MG tablet 120 tablet 2    Sig: Take 1 tablet (325 mg total) by mouth 2 (two) times daily with a meal.  . nystatin cream (MYCOSTATIN) 30 g 0    Sig: Apply to affected area BID    Return in about 3 months (around 02/15/2018), or if symptoms worsen or fail to improve.  Karle Plumber, MD, FACP

## 2017-11-19 LAB — COMPREHENSIVE METABOLIC PANEL
A/G RATIO: 1.3 (ref 1.2–2.2)
ALK PHOS: 95 IU/L (ref 39–117)
ALT: 12 IU/L (ref 0–32)
AST: 10 IU/L (ref 0–40)
Albumin: 3.7 g/dL (ref 3.5–5.5)
BILIRUBIN TOTAL: 0.2 mg/dL (ref 0.0–1.2)
BUN / CREAT RATIO: 8 — AB (ref 9–23)
BUN: 7 mg/dL (ref 6–24)
CHLORIDE: 103 mmol/L (ref 96–106)
CO2: 26 mmol/L (ref 20–29)
Calcium: 9.2 mg/dL (ref 8.7–10.2)
Creatinine, Ser: 0.87 mg/dL (ref 0.57–1.00)
GFR calc Af Amer: 93 mL/min/{1.73_m2} (ref >59)
GFR calc non Af Amer: 81 mL/min/{1.73_m2} (ref >59)
GLOBULIN, TOTAL: 2.9 g/dL (ref 1.5–4.5)
GLUCOSE: 76 mg/dL (ref 65–99)
POTASSIUM: 4.8 mmol/L (ref 3.5–5.2)
SODIUM: 142 mmol/L (ref 134–144)
Total Protein: 6.6 g/dL (ref 6.0–8.5)

## 2017-11-19 LAB — CBC
HEMOGLOBIN: 10.7 g/dL — AB (ref 11.1–15.9)
Hematocrit: 34.3 % (ref 34.0–46.6)
MCH: 24.8 pg — AB (ref 26.6–33.0)
MCHC: 31.2 g/dL — AB (ref 31.5–35.7)
MCV: 79 fL (ref 79–97)
Platelets: 462 10*3/uL — ABNORMAL HIGH (ref 150–379)
RBC: 4.32 x10E6/uL (ref 3.77–5.28)
RDW: 16.9 % — ABNORMAL HIGH (ref 12.3–15.4)
WBC: 6.5 10*3/uL (ref 3.4–10.8)

## 2017-11-19 LAB — BRAIN NATRIURETIC PEPTIDE: BNP: 18.4 pg/mL (ref 0.0–100.0)

## 2017-11-22 ENCOUNTER — Telehealth: Payer: Self-pay

## 2017-11-22 NOTE — Telephone Encounter (Signed)
Contacted pt to go over lab results pt is aware and doesn't have any questions or concerns 

## 2017-11-26 ENCOUNTER — Ambulatory Visit: Payer: Self-pay | Attending: Internal Medicine

## 2017-11-27 ENCOUNTER — Other Ambulatory Visit (INDEPENDENT_AMBULATORY_CARE_PROVIDER_SITE_OTHER): Payer: Self-pay

## 2017-11-27 ENCOUNTER — Ambulatory Visit (INDEPENDENT_AMBULATORY_CARE_PROVIDER_SITE_OTHER): Payer: Self-pay | Admitting: Physician Assistant

## 2017-11-27 ENCOUNTER — Ambulatory Visit (INDEPENDENT_AMBULATORY_CARE_PROVIDER_SITE_OTHER): Payer: Self-pay

## 2017-11-27 ENCOUNTER — Encounter (INDEPENDENT_AMBULATORY_CARE_PROVIDER_SITE_OTHER): Payer: Self-pay | Admitting: Orthopaedic Surgery

## 2017-11-27 DIAGNOSIS — M25562 Pain in left knee: Secondary | ICD-10-CM

## 2017-11-27 DIAGNOSIS — G8929 Other chronic pain: Secondary | ICD-10-CM

## 2017-11-27 DIAGNOSIS — M1712 Unilateral primary osteoarthritis, left knee: Secondary | ICD-10-CM

## 2017-11-27 MED ORDER — METHYLPREDNISOLONE ACETATE 40 MG/ML IJ SUSP
40.0000 mg | INTRAMUSCULAR | Status: AC | PRN
  Administered 2017-11-27: 40 mg via INTRA_ARTICULAR

## 2017-11-27 MED ORDER — LIDOCAINE HCL 1 % IJ SOLN
0.5000 mL | INTRAMUSCULAR | Status: AC | PRN
  Administered 2017-11-27: .5 mL

## 2017-11-27 NOTE — Progress Notes (Signed)
Office Visit Note   Patient: Hannah Baldwin           Date of Birth: 1971-02-10           MRN: [id number] Visit Date: 11/27/2017              Requested by: Ladell Pier, MD [address] [place], [place] [address] PCP: Ladell Pier, MD   Assessment & Plan: Visit Diagnoses:  1. Chronic pain of left knee     Plan: Discussed with her quad strengthening exercises also discussed knee friendly exercises including water aerobics and swimming which would be good for both knee and for generalized weight loss.  Discussed with her at length need for her to lose weight for the benefit of her knee.  But also for overall health.  She did ask about gastric bypass surgery she is to discuss this with her primary care physician.  She will monitor glucose levels over the next few days as I did discuss with her that the cortisone injection may raise these.  She is not a surgical candidate at this time for her knee due to the fact of her weight and increased risks with a BMI greater than 40.  Follow-Up Instructions: Return in about 2 weeks (around 12/11/2017).   Orders:  Orders Placed This Encounter  Procedures  . Large Joint Inj: L knee  . XR Knee 1-2 Views Left   No orders of the defined types were placed in this encounter.     Procedures: Large Joint Inj: L knee on 11/27/2017 11:01 AM Medications: 0.5 mL lidocaine 1 %; 40 mg methylPREDNISolone acetate 40 MG/ML Consent was given by the patient. Patient was prepped and draped in the usual sterile fashion.       Clinical Data: No additional findings.   Subjective: Chief Complaint  Patient presents with  . Left Knee - Pain    HPI Hannah Baldwin is a 47 year old female comes in today due to left knee pain for at least a year.  No known injury.  She states that she has giving way and painful popping of the knee.  She is tried ibuprofen Mobic and some pain medications and friends without any real relief.  She also got a cortisone  injection in the knee approximately a year ago which helped for at least 2 weeks.  She states that the pain in the knee is keeping her from being active.  She is trying to lose weight. Review of Systems No fevers chills shortness of breath chest pain  Objective: Vital Signs: 5 foot 3-1/2 inches 330 pounds  Physical Exam  Constitutional: She is oriented to person, place, and time. She appears well-developed and well-nourished. No distress.  Pulmonary/Chest: Effort normal.  Neurological: She is alert and oriented to person, place, and time.  Skin: She is not diaphoretic.  Psychiatric: She has a normal mood and affect.    Ortho Exam Right knee full range of motion without pain.  No instability valgus varus stressing.  Bilateral knees without effusion or abnormal warmth.  Left knee significant patellofemoral crepitus with painful range of motion.  Slight tenderness over the medial joint line.  No instability valgus varus stressing left knee.  [place] Cranker test is positive on the left negative on the right.  Calf supple nontender bilateral Specialty Comments:  No specialty comments available.  Imaging: Xr Knee 1-2 Views Left  Result Date: 11/27/2017 Left knee AP lateral views: No acute fracture.  Moderate patellofemoral  and medial joint line narrowing.  Lateral joint line appears well-maintained.  Knee is without subluxation dislocation.    PMFS History: Patient Active Problem List   Diagnosis Date Noted  . Primary osteoarthritis of left knee 04/09/2017  . Insomnia 11/03/2015  . Onychomycosis of toenail 07/25/2015  . Diabetic neuropathy ([place]) 07/25/2015  . DUB (dysfunctional uterine bleeding) 02/03/2015  . Diabetes type 2, controlled ([place]) 01/25/2015  . Severe obesity (BMI >= 40) ([place]) 01/25/2015  . Moderate persistent asthma 01/21/2015  . COPD (chronic obstructive pulmonary disease) ([place]) 01/20/2015  . Cardiomegaly 06/09/2014  . Hypochromic microcytic anemia 06/08/2014  . Former  smoker 01/01/2013   Past Medical History:  Diagnosis Date  . Anemia   . Asthma Dx 2014  . Diabetes mellitus without complication ([place])   . 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 Pneumonia ~ 2000 X 1    Family History  Problem Relation Age of Onset  . Asthma Mother   . Asthma Daughter     Past Surgical History:  Procedure Laterality Date  . [place]   Social History   Occupational History  . Not on file  Tobacco Use  . Smoking status: Former Smoker    Packs/day: 0.25    Years: 20.00    Pack years: 5.00    Types: Cigarettes    Last attempt to quit: 01/18/2015    Years since quitting: 2.8  . Smokeless tobacco: Never Used  Substance and Sexual Activity  . Alcohol use: Yes    Comment: 01/20/2015 "dank some alcohol on my birthday, cookouts,  etc,;  maybe once/month"  . Drug use: No  . Sexual activity: Not Currently    Birth control/protection: None

## 2017-12-03 ENCOUNTER — Ambulatory Visit: Payer: Self-pay | Attending: Orthopaedic Surgery | Admitting: Physical Therapy

## 2017-12-03 ENCOUNTER — Encounter: Payer: Self-pay | Admitting: Physical Therapy

## 2017-12-03 DIAGNOSIS — M6281 Muscle weakness (generalized): Secondary | ICD-10-CM | POA: Insufficient documentation

## 2017-12-03 DIAGNOSIS — G8929 Other chronic pain: Secondary | ICD-10-CM | POA: Insufficient documentation

## 2017-12-03 DIAGNOSIS — M25562 Pain in left knee: Secondary | ICD-10-CM | POA: Insufficient documentation

## 2017-12-03 DIAGNOSIS — M25662 Stiffness of left knee, not elsewhere classified: Secondary | ICD-10-CM | POA: Insufficient documentation

## 2017-12-03 NOTE — Patient Instructions (Signed)
Quad Sets    Slowly tighten thigh muscles of straight, left leg while counting out loud to __5__. Relax. Repeat __10__ times. Do _2___ sessions per day.  http://gt2.exer.us/293   Copyright  VHI. All rights reserved.    Straight Leg Raise: With External Leg Rotation    Lie on back with right leg straight, opposite leg bent. Rotate straight leg out and lift _8-10___ inches. Repeat ___10_ times per set. Do __1-2__ sets per session. Do __2__ sessions per day.  http://orth.exer.us/729   Copyright  VHI. All rights reserved.    Straight Leg Raise    Tighten stomach and slowly raise locked right leg _8-10___ inches from floor. Repeat __10__ times per set. Do __1-2__ sets per session. Do __2__ sessions per day.  http://orth.exer.us/1103   Copyright  VHI. All rights reserved.    Abduction: Side Leg Lift (Eccentric) - Side-Lying    Lie on side. Lift top leg slightly higher than shoulder level. Keep top leg straight with body, toes pointing forward. Slowly lower for 3-5 seconds. _10__ reps per set, __2_ sets per day, __5_ days per week.   http://ecce.exer.us/63   Copyright  VHI. All rights reserved.

## 2017-12-03 NOTE — Therapy (Signed)
[place], [place], [address] Phone: [phone]   Fax:  [phone]  Physical Therapy Evaluation  Patient Details  Name: Hannah Baldwin MRN: [id number] Date of Birth: 25-Sep-1971 No Data Recorded  Encounter Date: 12/03/2017  PT End of Session - 12/03/17 1055    Visit Number  1    Number of Visits  12    Date for PT Re-Evaluation  01/14/18    PT Start Time  1019    PT Stop Time  1100    PT Time Calculation (min)  41 min    Activity Tolerance  Patient tolerated treatment well    Behavior During Therapy  [facility] for tasks assessed/performed       Past Medical History:  Diagnosis Date  . Anemia   . Asthma Dx 2014  . Diabetes mellitus without complication ([place])   . 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 Pneumonia ~ 2000 X 1    Past Surgical History:  Procedure Laterality Date  . CESAREAN SECTION  1992    There were no vitals filed for this visit.   Subjective Assessment - 12/03/17 1024    Subjective  Patient has had chronic knee pain anout 2 yrs.  Getting worse over the past few months.  She received a shot 11/27/17 and that helped her, the previous one only lasted 2 weeks.   She has difficulty walking normal speeds. The knee slows her down.  She feels off balance at times.  She has discomfort in her knee constantly.  Knee pops and it takes her by surprise.  She has not fallen recently, has a cane but she uses rarely.      Pertinent History  diabetes, COPD, blind in L eye, severe obesity , neuropathy , knee OA     Limitations  Sitting;Standing;Walking;House hold activities    How long can you stand comfortably?  30 min depends on the day     How long can you walk comfortably?  she avoids walking due to pain    Diagnostic tests  11/27/17: moderate PF and medial joint line narrowing     Patient Stated Goals  walk better, feel more confidence     Currently in Pain?  Yes    Pain  Score  3  none at rest     Pain Location  Knee    Pain Orientation  Left;Anterior    Pain Descriptors / Indicators  Tightness;Aching    Pain Type  Chronic pain    Pain Onset  More than a month ago    Pain Frequency  Constant    Aggravating Factors   walking, sitting to long, standing too long     Pain Relieving Factors  cortisone shot, meds with little help     Effect of Pain on Daily Activities  limts her physically and ADLs     Multiple Pain Sites  No         [facility] PT Assessment - 12/03/17 0001      Assessment   Medical Diagnosis  L knee OA       Balance Screen   Has the patient fallen in the past 6 months  No      [place] residence    Living Arrangements  Spouse/significant other    Type of [place]  to enter    Entrance Stairs-Number of Steps  5    Entrance Stairs-Rails  None    Home Layout  One level    [place] - single point    Additional Comments  rails for toilet       Prior Function   Level of Independence  Independent with basic ADLs;Independent with community mobility without device    Vocation  Part time employment    Vocation Requirements  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, supervison , meal very light     Leisure  friends, movies, TV, shopping       AROM   Right Knee Extension  0    Right Knee Flexion  116    Left Knee Extension  0    Left Knee Flexion  105      Strength   Right Hip Flexion  4/5    Left Hip Flexion  4/5    Right Knee Flexion  5/5    Right Knee Extension  5/5    Left Knee Flexion  4+/5    Left Knee Extension  4+/5      Palpation   Patella mobility  hypomobile bilateral     Palpation comment  sore TTP medial joint line       Transfers   Five time sit to stand comments   14 sec              Objective measurements completed on examination: See above findings.      [place] Adult PT Treatment/Exercise - 12/03/17 0001      Ambulation/Gait   Ambulation  Distance (Feet)  150 Feet    Assistive device  None    Gait Pattern  Antalgic    Ambulation Surface  Level;Indoor    Gait velocity  not measured, slower than normal       Posture/Postural Control   Posture/Postural Control  Postural limitations    Postural Limitations  Flexed trunk    Posture Comments  obesity, genu recurvatum, valgus       Self-Care   Self-Care  Heat/Ice Application;Other Self-Care Comments    Heat/Ice Application  RICE , knee brace     Other Self-Care Comments   GAC for heated pool, HEP      Knee/Hip Exercises: Supine   Quad Sets  Strengthening;Both;1 set;10 reps    Straight Leg Raises  Strengthening;Left;1 set;10 reps      Knee/Hip Exercises: Sidelying   Hip ABduction  Strengthening;Both;1 set;10 reps             PT Education - 12/03/17 1124    Education provided  Yes    Education Details  PT/POC, HEP, local aquatic programs    Person(s) Educated  Patient    Methods  Explanation;Handout;Verbal cues    Comprehension  Verbalized understanding;Need further instruction          PT Long Term Goals - 12/03/17 1124      PT LONG TERM GOAL #1   Title  Pt will be I with HEP for LE strength, endurance, flexibility     Time  6    Period  Weeks    Status  New    Target Date  01/14/18      PT LONG TERM GOAL #2   Title  Pt will be able to walk normalized gait speed (< 2.62 feet per sec with improved confidence     Time  6    Period  Weeks    Status  New    Target Date  01/14/18      PT LONG TERM GOAL #3   Title  Pt will demo strength in hips 4+/5 or more for maximal knee support with gait, transfers.     Time  6    Period  Weeks    Status  New    Target Date  01/14/18      PT LONG TERM GOAL #4   Title  FOTO score will improve to <40% to show functional mobility improvement.     Time  6    Period  Weeks    Status  New    Target Date  01/14/18             Plan - 12/03/17 1106    Clinical Impression Statement  Patient presents for  mod complexity evaluation of chronic L knee pain due to moderate degenerative changes in patellofemoral  and medial compartment.   She is weak in hips, core, knees strong with testing but funcitonally lack control and stability.  Her pain is worsening and often experiences popping while walking which is painful to her.  She acknowledges with role weight plays in this issue and is considering aquatics if the pool is warm.  She will be given HEP that she can do consistently to aid in her strengthening and progress towards improved function and quality of life.     History and Personal Factors relevant to plan of care:  obesity, chronic nature of pain, diabetes, poor endurance     Clinical Presentation  Evolving    Clinical Presentation due to:  addition of popping of knee joint and feeling of instabilty while walking.     Clinical Decision Making  Moderate    Rehab Potential  Good    PT Frequency  2x / week    PT Duration  6 weeks    PT Treatment/Interventions  ADLs/Self Care Home Management;Electrical Stimulation;Functional mobility training;Taping;Therapeutic activities;Iontophoresis 4mg /ml Dexamethasone;Moist Heat;Cryotherapy;Ultrasound;Gait training;Stair training;Therapeutic exercise;DME Instruction;Balance training;Patient/family education;Manual techniques;Dry needling;Passive range of motion    PT Next Visit Plan  progress quad strength, ROM, hip, try NuStep     PT Home Exercise Plan  SLR flexion and abduction, quad set    Consulted and Agree with Plan of Care  Patient       Patient will benefit from skilled therapeutic intervention in order to improve the following deficits and impairments:  Pain, Impaired flexibility, Increased fascial restricitons, Decreased strength, Decreased activity tolerance, Obesity, Decreased range of motion, Difficulty walking, Hypomobility, Decreased balance, Decreased endurance, Decreased mobility  Visit Diagnosis: Chronic pain of left knee  Muscle weakness  (generalized)  Stiffness of left knee, not elsewhere classified     Problem List Patient Active Problem List   Diagnosis Date Noted  . Primary osteoarthritis of left knee 04/09/2017  . Insomnia 11/03/2015  . Onychomycosis of toenail 07/25/2015  . Diabetic neuropathy ([place]) 07/25/2015  . DUB (dysfunctional uterine bleeding) 02/03/2015  . Diabetes type 2, controlled ([place]) 01/25/2015  . Severe obesity (BMI >= 40) ([place]) 01/25/2015  . Moderate persistent asthma 01/21/2015  . COPD (chronic obstructive pulmonary disease) ([place]) 01/20/2015  . Cardiomegaly 06/09/2014  . Hypochromic microcytic anemia 06/08/2014  . Former smoker 01/01/2013    Almina Schul 12/03/2017, 11:41 AM  [place], [place], [address] Phone: [phone]   Fax:  [phone]  Name: KAIT[place] LEWELLYN MRN: [id number] Date of Birth: 11-14-1970  Raeford Razor, PT 12/03/17 11:41 AM Phone: [phone] Fax: [phone]

## 2017-12-05 ENCOUNTER — Telehealth: Payer: Self-pay | Admitting: Internal Medicine

## 2017-12-05 NOTE — Telephone Encounter (Signed)
Patient wanted to notify pcp that she did receive her orange card. Patient wanted to notify referral coordinator.

## 2017-12-06 ENCOUNTER — Telehealth: Payer: Self-pay | Admitting: Internal Medicine

## 2017-12-06 NOTE — Telephone Encounter (Signed)
Received fax from [place] requesting pcp signature, fax will on the PCP in-box

## 2017-12-11 ENCOUNTER — Encounter (INDEPENDENT_AMBULATORY_CARE_PROVIDER_SITE_OTHER): Payer: Self-pay | Admitting: Orthopaedic Surgery

## 2017-12-11 ENCOUNTER — Ambulatory Visit (INDEPENDENT_AMBULATORY_CARE_PROVIDER_SITE_OTHER): Payer: Self-pay | Admitting: Orthopaedic Surgery

## 2017-12-11 DIAGNOSIS — M1712 Unilateral primary osteoarthritis, left knee: Secondary | ICD-10-CM

## 2017-12-11 NOTE — Progress Notes (Signed)
HPI: Mrs. Lavey returns today follow-up for left knee status post knee injection 11/27/2017.  She states that the injection is wonderful and very helpful in regards to her knee pain.  States knee is overall doing very well.  She remains on her meloxicam.  She is going to physical therapy and finds is beneficial.  She is trying to cut down on her caloric intake.  She notes that the cortisone did not elevate her glucose levels.  Physical exam: Left knee she has full extension flexion to 90 degrees no instability.  No abnormal warmth erythema or effusion.  Impression: Left knee arthritis involving the medial compartment and patellofemoral compartment  Plan: At this point time she will continue to work with therapy and strengthening the knee.  Also continue to work on weight loss.  She may benefit from a supplemental injection in the knee future if her knee pain returns shortly.  Time.  She does note that she could have cortisone injections every 3 months at the earliest.

## 2017-12-12 ENCOUNTER — Ambulatory Visit: Payer: Self-pay | Attending: Orthopaedic Surgery | Admitting: Physical Therapy

## 2017-12-12 ENCOUNTER — Encounter: Payer: Self-pay | Admitting: Physical Therapy

## 2017-12-12 DIAGNOSIS — M25662 Stiffness of left knee, not elsewhere classified: Secondary | ICD-10-CM | POA: Insufficient documentation

## 2017-12-12 DIAGNOSIS — G8929 Other chronic pain: Secondary | ICD-10-CM | POA: Insufficient documentation

## 2017-12-12 DIAGNOSIS — M25562 Pain in left knee: Secondary | ICD-10-CM | POA: Insufficient documentation

## 2017-12-12 DIAGNOSIS — M6281 Muscle weakness (generalized): Secondary | ICD-10-CM | POA: Insufficient documentation

## 2017-12-12 NOTE — Therapy (Signed)
[place], [place], [address] Phone: [phone]   Fax:  [phone]  Physical Therapy Treatment  Patient Details  Name: Hannah Baldwin MRN: [id number] Date of Birth: 1970-11-02 No Data Recorded  Encounter Date: 12/12/2017  PT End of Session - 12/12/17 1211    Visit Number  2    Number of Visits  12    Date for PT Re-Evaluation  01/14/18    PT Start Time  1140    PT Stop Time  1218    PT Time Calculation (min)  38 min       Past Medical History:  Diagnosis Date  . Anemia   . Asthma Dx 2014  . Diabetes mellitus without complication ([place])   . 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 Pneumonia ~ 2000 X 1    Past Surgical History:  Procedure Laterality Date  . CESAREAN SECTION  1992    There were no vitals filed for this visit.  Subjective Assessment - 12/12/17 1149    Subjective  Sometimes have pain.     Currently in Pain?  Yes    Pain Score  5     Pain Location  Knee    Pain Orientation  Left;Anterior    Pain Descriptors / Indicators  Sharp    Pain Type  Chronic pain    Pain Frequency  Intermittent    Aggravating Factors   just shaprs pains sometimes, not always the same thing    Pain Relieving Factors  cortisone shot, meds                      [facility] Adult PT Treatment/Exercise - 12/12/17 0001      Knee/Hip Exercises: Stretches   Active Hamstring Stretch  Both;2 reps;20 seconds      Knee/Hip Exercises: Aerobic   Nustep  L1 UE/LE x 5 min      Knee/Hip Exercises: Standing   Heel Raises  20 reps    Knee Flexion  2 sets;10 reps      Knee/Hip Exercises: Seated   Long Arc Quad  Both;10 reps    Sit to [employer]  10 reps      Knee/Hip Exercises: Supine   Quad Sets  Both;10 reps    Short Arc Quad Sets  2 sets;10 reps 3#    Bridges  10 reps    Straight Leg Raises  Both;10 reps    Other Supine Knee/Hip Exercises  ball squeeze       Knee/Hip  Exercises: Sidelying   Hip ABduction  Both;2 sets;10 reps                  PT Long Term Goals - 12/03/17 1124      PT LONG TERM GOAL #1   Title  Pt will be I with HEP for LE strength, endurance, flexibility     Time  6    Period  Weeks    Status  New    Target Date  01/14/18      PT LONG TERM GOAL #2   Title  Pt will be able to walk normalized gait speed (< 2.62 feet per sec with improved confidence     Time  6    Period  Weeks    Status  New    Target Date  01/14/18      PT LONG TERM  GOAL #3   Title  Pt will demo strength in hips 4+/5 or more for maximal knee support with gait, transfers.     Time  6    Period  Weeks    Status  New    Target Date  01/14/18      PT LONG TERM GOAL #4   Title  FOTO score will improve to <40% to show functional mobility improvement.     Time  6    Period  Weeks    Status  New    Target Date  01/14/18            Plan - 12/12/17 1230    Clinical Impression Statement  Pt reports intermittent sharp pains without a consistent aggravating factor. She is doing her HEP. Increased reps today and progressed strengthening. Began sit-stands and added to HEP. Pt reports no increased pain at end of session.     PT Next Visit Plan  progress quad strength, ROM, hip, continue Nustep     PT Home Exercise Plan  SLR flexion and abduction, quad set    Consulted and Agree with Plan of Care  Patient       Patient will benefit from skilled therapeutic intervention in order to improve the following deficits and impairments:  Pain, Impaired flexibility, Increased fascial restricitons, Decreased strength, Decreased activity tolerance, Obesity, Decreased range of motion, Difficulty walking, Hypomobility, Decreased balance, Decreased endurance, Decreased mobility  Visit Diagnosis: Chronic pain of left knee  Muscle weakness (generalized)  Stiffness of left knee, not elsewhere classified     Problem List Patient Active Problem List    Diagnosis Date Noted  . Primary osteoarthritis of left knee 04/09/2017  . Insomnia 11/03/2015  . Onychomycosis of toenail 07/25/2015  . Diabetic neuropathy ([place]) 07/25/2015  . DUB (dysfunctional uterine bleeding) 02/03/2015  . Diabetes type 2, controlled ([place]) 01/25/2015  . Severe obesity (BMI >= 40) ([place]) 01/25/2015  . Moderate persistent asthma 01/21/2015  . COPD (chronic obstructive pulmonary disease) ([place]) 01/20/2015  . Cardiomegaly 06/09/2014  . Hypochromic microcytic anemia 06/08/2014  . Former smoker 01/01/2013    Dorene Ar , PTA 12/12/2017, 12:48 PM  [facility] [address] [place], [place], [address] Phone: [phone]   Fax:  [phone]  Name: Hannah Baldwin MRN: [id number] Date of Birth: 09-26-1971

## 2017-12-13 ENCOUNTER — Ambulatory Visit: Payer: Self-pay | Admitting: Physical Therapy

## 2017-12-17 ENCOUNTER — Ambulatory Visit: Payer: Self-pay | Admitting: Physical Therapy

## 2017-12-19 ENCOUNTER — Encounter: Payer: Self-pay | Admitting: Physical Therapy

## 2017-12-19 ENCOUNTER — Ambulatory Visit: Payer: Self-pay | Admitting: Physical Therapy

## 2017-12-19 DIAGNOSIS — M6281 Muscle weakness (generalized): Secondary | ICD-10-CM

## 2017-12-19 DIAGNOSIS — M25662 Stiffness of left knee, not elsewhere classified: Secondary | ICD-10-CM

## 2017-12-19 DIAGNOSIS — G8929 Other chronic pain: Secondary | ICD-10-CM

## 2017-12-19 DIAGNOSIS — M25562 Pain in left knee: Principal | ICD-10-CM

## 2017-12-19 NOTE — Therapy (Signed)
[place], [place], [address] Phone: [phone]   Fax:  [phone]  Physical Therapy Treatment  Patient Details  Name: Hannah Baldwin MRN: [id number] Date of Birth: December 03, 1970 No Data Recorded  Encounter Date: 12/19/2017  PT End of Session - 12/19/17 1042    Visit Number  3    Number of Visits  12    Date for PT Re-Evaluation  01/14/18    PT Start Time  6073    PT Stop Time  1053    PT Time Calculation (min)  38 min    Activity Tolerance  Patient tolerated treatment well    Behavior During Therapy  [facility] for tasks assessed/performed       Past Medical History:  Diagnosis Date  . Anemia   . Asthma Dx 2014  . Diabetes mellitus without complication ([place])   . 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 Pneumonia ~ 2000 X 1    Past Surgical History:  Procedure Laterality Date  . CESAREAN SECTION  1992    There were no vitals filed for this visit.  Subjective Assessment - 12/19/17 1018    Subjective  Pt not having a good day.  Knee hurts and other things, too.  Stressed.  Had to miss last one due to illness. Wants to leave early.     Currently in Pain?  Yes    Pain Score  5     Pain Location  Knee    Pain Orientation  Left;Anterior;Medial    Pain Type  Chronic pain    Pain Onset  More than a month ago    Pain Frequency  Intermittent    Aggravating Factors   walking , bending it     Pain Relieving Factors  shot, meds          [facility] Adult PT Treatment/Exercise - 12/19/17 0001      Knee/Hip Exercises: Stretches   Active Hamstring Stretch  Both;2 reps;20 seconds with strap       Knee/Hip Exercises: Aerobic   Nustep  L1 UE/LE x 6 min      Knee/Hip Exercises: Seated   Long Arc Quad  Both;20 reps;Weights    Long Arc Quad Weight  3 lbs.    Marching  Strengthening;Both;1 set;10 reps    Marching Limitations  green band     Hamstring Curl  Strengthening;Left;1 set;20 reps     Hamstring Limitations  green band     Sit to [employer]  2 sets;without UE support      Knee/Hip Exercises: Supine   Knee Extension  Strengthening;Both;1 set with ball squeeze     Other Supine Knee/Hip Exercises  ball squeeze     Other Supine Knee/Hip Exercises  clam green band x 10       Modalities   Modalities  Cryotherapy declined      Manual Therapy   Manual Therapy  Edema management    Manual therapy comments  gentle patellar mobs     Edema Management  retro sweeping for edema, pain              PT Education - 12/19/17 1122    Education provided  Yes    Education Details  HEP, importance of hip strength     Person(s) Educated  Patient    Methods  Explanation;Verbal cues    Comprehension  Verbalized understanding  PT Long Term Goals - 12/19/17 1045      PT LONG TERM GOAL #1   Title  Pt will be I with HEP for LE strength, endurance, flexibility     Status  On-going      PT LONG TERM GOAL #2   Title  Pt will be able to walk normalized gait speed (< 2.62 feet per sec with improved confidence     Status  On-going      PT LONG TERM GOAL #3   Title  Pt will demo strength in hips 4+/5 or more for maximal knee support with gait, transfers.     Status  On-going      PT LONG TERM GOAL #4   Title  FOTO score will improve to <40% to show functional mobility improvement.     Status  On-going            Plan - 12/19/17 1042    Clinical Impression Statement  Patient began to feel better during session, had less knee stiffness.  She was able to perform seated and supine mat exercises without knee pain increasing,  May benefit from ultrasound next visit medial knee, will wear more loose fitting pants next session.     PT Treatment/Interventions  ADLs/Self Care Home Management;Electrical Stimulation;Functional mobility training;Taping;Therapeutic activities;Iontophoresis 4mg /ml Dexamethasone;Moist Heat;Cryotherapy;Ultrasound;Gait training;Stair  training;Therapeutic exercise;DME Instruction;Balance training;Patient/family education;Manual techniques;Dry needling;Passive range of motion    PT Next Visit Plan  progress quad strength, ROM, hip, continue Nustep . Try [place] /ionto    PT Home Exercise Plan  SLR flexion and abduction, quad set, sit to stand    Consulted and Agree with Plan of Care  Patient       Patient will benefit from skilled therapeutic intervention in order to improve the following deficits and impairments:  Pain, Impaired flexibility, Increased fascial restricitons, Decreased strength, Decreased activity tolerance, Obesity, Decreased range of motion, Difficulty walking, Hypomobility, Decreased balance, Decreased endurance, Decreased mobility  Visit Diagnosis: Chronic pain of left knee  Muscle weakness (generalized)  Stiffness of left knee, not elsewhere classified     Problem List Patient Active Problem List   Diagnosis Date Noted  . Primary osteoarthritis of left knee 04/09/2017  . Insomnia 11/03/2015  . Onychomycosis of toenail 07/25/2015  . Diabetic neuropathy ([place]) 07/25/2015  . DUB (dysfunctional uterine bleeding) 02/03/2015  . Diabetes type 2, controlled ([place]) 01/25/2015  . Severe obesity (BMI >= 40) ([place]) 01/25/2015  . Moderate persistent asthma 01/21/2015  . COPD (chronic obstructive pulmonary disease) ([place]) 01/20/2015  . Cardiomegaly 06/09/2014  . Hypochromic microcytic anemia 06/08/2014  . Former smoker 01/01/2013    PAA,JENNIFER 12/19/2017, 11:34 AM  [place], [place], [address] Phone: [phone]   Fax:  [phone]  Name: SORREL CASSETTA MRN: [id number] Date of Birth: Jun 19, 1971  Raeford Razor, PT 12/19/17 11:34 AM Phone: [phone] Fax: [phone]

## 2017-12-24 ENCOUNTER — Encounter: Payer: Self-pay | Admitting: Physical Therapy

## 2017-12-24 ENCOUNTER — Ambulatory Visit: Payer: Self-pay | Admitting: Physical Therapy

## 2017-12-24 DIAGNOSIS — G8929 Other chronic pain: Secondary | ICD-10-CM

## 2017-12-24 DIAGNOSIS — M25562 Pain in left knee: Principal | ICD-10-CM

## 2017-12-24 DIAGNOSIS — M6281 Muscle weakness (generalized): Secondary | ICD-10-CM

## 2017-12-24 NOTE — Therapy (Signed)
[place], [place], [address] Phone: [phone]   Fax:  [phone]  Physical Therapy Treatment  Patient Details  Name: Hannah Baldwin MRN: [id number] Date of Birth: 03-Sep-1971 No Data Recorded  Encounter Date: 12/24/2017  PT End of Session - 12/24/17 1118    Visit Number  4    Number of Visits  12    Date for PT Re-Evaluation  01/14/18    PT Start Time  1103    PT Stop Time  1145    PT Time Calculation (min)  42 min       Past Medical History:  Diagnosis Date  . Anemia   . Asthma Dx 2014  . Diabetes mellitus without complication ([place])   . 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 Pneumonia ~ 2000 X 1    Past Surgical History:  Procedure Laterality Date  . CESAREAN SECTION  1992    There were no vitals filed for this visit.  Subjective Assessment - 12/24/17 1114    Subjective  Not having much pain today. It hurts a little on the inside of the knee. Sometimes it gives out.     Currently in Pain?  Yes    Pain Score  3     Pain Location  Knee    Pain Orientation  Left;Medial;Anterior    Pain Descriptors / Indicators  Sharp;Aching                      [facility] Adult PT Treatment/Exercise - 12/24/17 0001      Knee/Hip Exercises: Stretches   Gastroc Stretch  2 reps;30 seconds      Knee/Hip Exercises: Aerobic   Nustep  L4 UE/LE x 5 min      Knee/Hip Exercises: Standing   Heel Raises  20 reps    Knee Flexion  2 sets;10 reps      Knee/Hip Exercises: Seated   Long Arc Quad  20 reps    Long Arc Quad Weight  3 lbs.    Sit to [employer]  10 reps;without UE support      Knee/Hip Exercises: Supine   Short Arc Quad Sets  2 sets;10 reps 3#    Bridges with [employer]  10 reps    Straight Leg Raises  Left;10 reps    Other Supine Knee/Hip Exercises  ball squeeze       Modalities   Modalities  Ultrasound      Ultrasound   Ultrasound Location  medial left knee     Ultrasound Parameters  1.0 w/cm2 50% x 8 min    Ultrasound Goals  Pain      Manual Therapy   Manual therapy comments  gentle patellar mobs                   PT Long Term Goals - 12/19/17 1045      PT LONG TERM GOAL #1   Title  Pt will be I with HEP for LE strength, endurance, flexibility     Status  On-going      PT LONG TERM GOAL #2   Title  Pt will be able to walk normalized gait speed (< 2.62 feet per sec with improved confidence     Status  On-going      PT LONG TERM GOAL #3   Title  Pt will demo strength in hips  4+/5 or more for maximal knee support with gait, transfers.     Status  On-going      PT LONG TERM GOAL #4   Title  FOTO score will improve to <40% to show functional mobility improvement.     Status  On-going            Plan - 12/24/17 1152    Clinical Impression Statement  Pt reports low level of knee pain. She reports compliance with HEP. Pain is intermittent at medial knee and sometimes gives away. Performed patella mobs and focused quad strenght with VMO recruitment. Trial of [place] today along medial joint line. Pt reports significant decrease in pain after ultrasound.     PT Next Visit Plan  progress quad strength, ROM, hip, continue Nustep . Continue [place] if helpful, ionto?    PT Home Exercise Plan  SLR flexion and abduction, quad set, sit to stand    Consulted and Agree with Plan of Care  Patient       Patient will benefit from skilled therapeutic intervention in order to improve the following deficits and impairments:  Pain, Impaired flexibility, Increased fascial restricitons, Decreased strength, Decreased activity tolerance, Obesity, Decreased range of motion, Difficulty walking, Hypomobility, Decreased balance, Decreased endurance, Decreased mobility  Visit Diagnosis: Chronic pain of left knee  Muscle weakness (generalized)     Problem List Patient Active Problem List   Diagnosis Date Noted  . Primary osteoarthritis of left knee  04/09/2017  . Insomnia 11/03/2015  . Onychomycosis of toenail 07/25/2015  . Diabetic neuropathy ([place]) 07/25/2015  . DUB (dysfunctional uterine bleeding) 02/03/2015  . Diabetes type 2, controlled ([place]) 01/25/2015  . Severe obesity (BMI >= 40) ([place]) 01/25/2015  . Moderate persistent asthma 01/21/2015  . COPD (chronic obstructive pulmonary disease) ([place]) 01/20/2015  . Cardiomegaly 06/09/2014  . Hypochromic microcytic anemia 06/08/2014  . Former smoker 01/01/2013    Dorene Ar, PTA 12/24/2017, 11:55 AM  [place], [place], [address] Phone: [phone]   Fax:  [phone]  Name: HARRY BARK MRN: [id number] Date of Birth: 1970-12-10

## 2017-12-26 MED FILL — MEGESTROL 40 MG TABLET: 40 | 30 days supply | Qty: 120 | Fill #1

## 2017-12-26 MED FILL — ?DULoxetine HCL 6OMG CP: 60 | 30 days supply | Qty: 30 | Fill #1

## 2017-12-27 ENCOUNTER — Encounter: Payer: Self-pay | Admitting: Physical Therapy

## 2017-12-27 ENCOUNTER — Ambulatory Visit: Payer: Self-pay | Admitting: Physical Therapy

## 2017-12-27 DIAGNOSIS — M6281 Muscle weakness (generalized): Secondary | ICD-10-CM

## 2017-12-27 DIAGNOSIS — G8929 Other chronic pain: Secondary | ICD-10-CM

## 2017-12-27 DIAGNOSIS — M25662 Stiffness of left knee, not elsewhere classified: Secondary | ICD-10-CM

## 2017-12-27 DIAGNOSIS — M25562 Pain in left knee: Principal | ICD-10-CM

## 2017-12-27 NOTE — Therapy (Signed)
[place], [place], [address] Phone: [phone]   Fax:  [phone]  Physical Therapy Treatment  Patient Details  Name: Hannah Baldwin MRN: [id number] Date of Birth: 1971/10/04 No data recorded  Encounter Date: 12/27/2017  PT End of Session - 12/27/17 1102    Visit Number  5    Number of Visits  12    Date for PT Re-Evaluation  01/14/18    PT Start Time  6269    PT Stop Time  1101    PT Time Calculation (min)  47 min    Activity Tolerance  Patient tolerated treatment well    Behavior During Therapy  [facility] for tasks assessed/performed       Past Medical History:  Diagnosis Date  . Anemia   . Asthma Dx 2014  . Diabetes mellitus without complication ([place])   . 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 Pneumonia ~ 2000 X 1    Past Surgical History:  Procedure Laterality Date  . CESAREAN SECTION  1992    There were no vitals filed for this visit.  Subjective Assessment - 12/27/17 1018    Subjective  I lost my voice again.  I may call my Doctor (Allergies?).  Knee is good.      Currently in Pain?  Yes    Pain Score  1     Pain Location  Knee    Pain Orientation  Left;Anterior;Medial    Pain Descriptors / Indicators  Discomfort    Pain Type  Chronic pain    Pain Onset  More than a month ago    Pain Frequency  Intermittent    Aggravating Factors   walking, bending    Pain Relieving Factors  shots, meds, Ultrasound          [facility] Adult PT Treatment/Exercise - 12/27/17 0001      Knee/Hip Exercises: Stretches   Active Hamstring Stretch  Both;2 reps;30 seconds    Gastroc Stretch  2 reps;30 seconds slant board       Knee/Hip Exercises: Aerobic   Nustep  L4 UE/LE x 5 min      Knee/Hip Exercises: Machines for Strengthening   Cybex Knee Extension  10 lbs 2 x 15 reps     Cybex Knee Flexion  20 lbs 2 x 15 reps       Knee/Hip Exercises: Standing   Heel Raises  20 reps     Knee Flexion  Strengthening;Both;1 set;10 reps;Other (comment) high march 4 lbs     Hip Extension  Stengthening;Both;1 set;10 reps    Extension Limitations  hamstring curl 4 lbs each     Forward Step Up  Both;1 set;15 reps    Functional Squat  1 set;15 reps cus to put wgt in heels       Ultrasound   Ultrasound Location  medial L knee     Ultrasound Parameters  1.0 W/cm2, 50 %, 1 MHz    Ultrasound Goals  Pain             PT Education - 12/27/17 1103    Education provided  No          PT Long Term Goals - 12/19/17 1045      PT LONG TERM GOAL #1   Title  Pt will be I with HEP for LE strength, endurance, flexibility     Status  On-going  PT LONG TERM GOAL #2   Title  Pt will be able to walk normalized gait speed (< 2.62 feet per sec with improved confidence     Status  On-going      PT LONG TERM GOAL #3   Title  Pt will demo strength in hips 4+/5 or more for maximal knee support with gait, transfers.     Status  On-going      PT LONG TERM GOAL #4   Title  FOTO score will improve to <40% to show functional mobility improvement.     Status  On-going            Plan - 12/27/17 1103    Clinical Impression Statement  Cont to have min knee pain.  Rt. knee min pain as well with step ups.  Able to raise bent knee up to hip height without difficulty.     PT Treatment/Interventions  ADLs/Self Care Home Management;Electrical Stimulation;Functional mobility training;Taping;Therapeutic activities;Iontophoresis 4mg /ml Dexamethasone;Moist Heat;Cryotherapy;Ultrasound;Gait training;Stair training;Therapeutic exercise;DME Instruction;Balance training;Patient/family education;Manual techniques;Dry needling;Passive range of motion    PT Next Visit Plan  try sidelying.  progress quad strength, ROM, hip, continue Nustep . Continue [place] if helpful, ionto?    PT Home Exercise Plan  SLR flexion and abduction, quad set, sit to stand    Consulted and Agree with Plan of Care  Patient        Patient will benefit from skilled therapeutic intervention in order to improve the following deficits and impairments:  Pain, Impaired flexibility, Increased fascial restricitons, Decreased strength, Decreased activity tolerance, Obesity, Decreased range of motion, Difficulty walking, Hypomobility, Decreased balance, Decreased endurance, Decreased mobility  Visit Diagnosis: Chronic pain of left knee  Muscle weakness (generalized)  Stiffness of left knee, not elsewhere classified     Problem List Patient Active Problem List   Diagnosis Date Noted  . Primary osteoarthritis of left knee 04/09/2017  . Insomnia 11/03/2015  . Onychomycosis of toenail 07/25/2015  . Diabetic neuropathy ([place]) 07/25/2015  . DUB (dysfunctional uterine bleeding) 02/03/2015  . Diabetes type 2, controlled ([place]) 01/25/2015  . Severe obesity (BMI >= 40) ([place]) 01/25/2015  . Moderate persistent asthma 01/21/2015  . COPD (chronic obstructive pulmonary disease) ([place]) 01/20/2015  . Cardiomegaly 06/09/2014  . Hypochromic microcytic anemia 06/08/2014  . Former smoker 01/01/2013    Gennaro Lizotte 12/27/2017, 11:05 AM  [place], [place], [address] Phone: [phone]   Fax:  [phone]  Name: Hannah Baldwin MRN: [id number] Date of Birth: Sep 26, 1971   Raeford Razor, PT 12/27/17 11:06 AM Phone: [phone] Fax: [phone]

## 2017-12-31 ENCOUNTER — Ambulatory Visit: Payer: Self-pay | Admitting: Physical Therapy

## 2017-12-31 DIAGNOSIS — G8929 Other chronic pain: Secondary | ICD-10-CM

## 2017-12-31 DIAGNOSIS — M25562 Pain in left knee: Principal | ICD-10-CM

## 2017-12-31 DIAGNOSIS — M25662 Stiffness of left knee, not elsewhere classified: Secondary | ICD-10-CM

## 2017-12-31 DIAGNOSIS — M6281 Muscle weakness (generalized): Secondary | ICD-10-CM

## 2017-12-31 NOTE — Therapy (Signed)
[place], [place], [address] Phone: [phone]   Fax:  [phone]  Physical Therapy Treatment  Patient Details  Name: Hannah Baldwin MRN: [id number] Date of Birth: 05/30/1971 No data recorded  Encounter Date: 12/31/2017  PT End of Session - 12/31/17 1125    Visit Number  6    Number of Visits  12    Date for PT Re-Evaluation  01/14/18    PT Start Time  1103    PT Stop Time  1141    PT Time Calculation (min)  38 min       Past Medical History:  Diagnosis Date  . Anemia   . Asthma Dx 2014  . Diabetes mellitus without complication ([place])   . 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 Pneumonia ~ 2000 X 1    Past Surgical History:  Procedure Laterality Date  . CESAREAN SECTION  1992    There were no vitals filed for this visit.  Subjective Assessment - 12/31/17 1124    Subjective  My voice came and went again.     Currently in Pain?  Yes    Pain Score  3     Pain Location  Knee    Pain Orientation  Left;Medial;Anterior    Pain Descriptors / Indicators  Discomfort                No data recorded       [facility] Adult PT Treatment/Exercise - 12/31/17 0001      Knee/Hip Exercises: Aerobic   Nustep  L4 UE/LE x 5 min      Knee/Hip Exercises: Seated   Long Arc Quad  20 reps    Long Arc Quad Weight  4 lbs.    Marching  Strengthening;Both;1 set;10 reps    Marching Limitations  green band     Hamstring Curl  Strengthening;Left;1 set;20 reps    Hamstring Limitations  green band       Knee/Hip Exercises: Supine   Short Arc Quad Sets  10 reps with ball squeeze    Bridges  10 reps    Bridges with [employer]  10 reps    Straight Leg Raises  Left;10 reps    Straight Leg Raise with External Rotation  10 reps    Other Supine Knee/Hip Exercises  ball squeeze       Ultrasound   Ultrasound Location  medial L knee     Ultrasound Parameters  1.0 w/cm2     Ultrasound Goals  Pain                  PT Long Term Goals - 12/19/17 1045      PT LONG TERM GOAL #1   Title  Pt will be I with HEP for LE strength, endurance, flexibility     Status  On-going      PT LONG TERM GOAL #2   Title  Pt will be able to walk normalized gait speed (< 2.62 feet per sec with improved confidence     Status  On-going      PT LONG TERM GOAL #3   Title  Pt will demo strength in hips 4+/5 or more for maximal knee support with gait, transfers.     Status  On-going      PT LONG TERM GOAL #4   Title  FOTO score will improve to <40%  to show functional mobility improvement.     Status  On-going            Plan - 12/31/17 1155    Clinical Impression Statement  pt arrives with laryngitis and c/o SOB. SP02% 96 and above during treatment. We did not perform closed chain today. She will see MD for follow up on her respiratiry symptoms tomorrow.     PT Next Visit Plan  try sidelying.  progress quad strength, ROM, hip, continue Nustep . Continue [place] if helpful, ionto?    PT Home Exercise Plan  SLR flexion and abduction, quad set, sit to stand    Consulted and Agree with Plan of Care  Patient       Patient will benefit from skilled therapeutic intervention in order to improve the following deficits and impairments:  Pain, Impaired flexibility, Increased fascial restricitons, Decreased strength, Decreased activity tolerance, Obesity, Decreased range of motion, Difficulty walking, Hypomobility, Decreased balance, Decreased endurance, Decreased mobility  Visit Diagnosis: Chronic pain of left knee  Muscle weakness (generalized)  Stiffness of left knee, not elsewhere classified     Problem List Patient Active Problem List   Diagnosis Date Noted  . Primary osteoarthritis of left knee 04/09/2017  . Insomnia 11/03/2015  . Onychomycosis of toenail 07/25/2015  . Diabetic neuropathy ([place]) 07/25/2015  . DUB (dysfunctional uterine bleeding) 02/03/2015  .  Diabetes type 2, controlled ([place]) 01/25/2015  . Severe obesity (BMI >= 40) ([place]) 01/25/2015  . Moderate persistent asthma 01/21/2015  . COPD (chronic obstructive pulmonary disease) ([place]) 01/20/2015  . Cardiomegaly 06/09/2014  . Hypochromic microcytic anemia 06/08/2014  . Former smoker 01/01/2013    Dorene Ar, PTA 12/31/2017, 11:57 AM  [facility] [address] [place], [place], [address] Phone: [phone]   Fax:  [phone]  Name: Hannah Baldwin MRN: [id number] Date of Birth: [birth year]

## 2018-01-01 ENCOUNTER — Ambulatory Visit: Payer: Self-pay | Attending: Internal Medicine | Admitting: Physician Assistant

## 2018-01-01 VITALS — BP 142/77 | HR 72 | Temp 98.4°F | Resp 15 | Wt 329.6 lb

## 2018-01-01 DIAGNOSIS — R490 Dysphonia: Secondary | ICD-10-CM | POA: Insufficient documentation

## 2018-01-01 DIAGNOSIS — J4540 Moderate persistent asthma, uncomplicated: Secondary | ICD-10-CM | POA: Insufficient documentation

## 2018-01-01 DIAGNOSIS — Z79899 Other long term (current) drug therapy: Secondary | ICD-10-CM | POA: Insufficient documentation

## 2018-01-01 DIAGNOSIS — R05 Cough: Secondary | ICD-10-CM | POA: Insufficient documentation

## 2018-01-01 DIAGNOSIS — Z7984 Long term (current) use of oral hypoglycemic drugs: Secondary | ICD-10-CM | POA: Insufficient documentation

## 2018-01-01 DIAGNOSIS — R059 Cough, unspecified: Secondary | ICD-10-CM

## 2018-01-01 DIAGNOSIS — E1142 Type 2 diabetes mellitus with diabetic polyneuropathy: Secondary | ICD-10-CM | POA: Insufficient documentation

## 2018-01-01 DIAGNOSIS — Z87891 Personal history of nicotine dependence: Secondary | ICD-10-CM | POA: Insufficient documentation

## 2018-01-01 LAB — POCT GLYCOSYLATED HEMOGLOBIN (HGB A1C): Hemoglobin A1C: 5.6

## 2018-01-01 LAB — GLUCOSE, POCT (MANUAL RESULT ENTRY): POC Glucose: 90 mg/dl (ref 70–99)

## 2018-01-01 MED ORDER — METHYLPREDNISOLONE SODIUM SUCC 125 MG IJ SOLR
125.0000 mg | Freq: Once | INTRAMUSCULAR | Status: AC
  Administered 2018-01-01: 125 mg via INTRAMUSCULAR

## 2018-01-01 MED ORDER — AZITHROMYCIN 250 MG PO TABS: ORAL_TABLET | ORAL | 0 refills | Status: DC

## 2018-01-01 MED ORDER — ALBUTEROL SULFATE (2.5 MG/3ML) 0.083% IN NEBU: 2.5000 mg | INHALATION_SOLUTION | Freq: Once | RESPIRATORY_TRACT | Status: DC

## 2018-01-01 MED ORDER — PREDNISONE 10 MG PO TABS: ORAL_TABLET | ORAL | 0 refills | Status: DC

## 2018-01-01 MED FILL — AZITHROMYCIN 250 MG TABLET: 250 | 5 days supply | Qty: 6 | Fill #0

## 2018-01-01 MED FILL — predniSONE 10 MG TABS: 10 | 6 days supply | Qty: 21 | Fill #0

## 2018-01-01 NOTE — Progress Notes (Signed)
Patient ID: Hannah Baldwin, female   DOB: 1971-07-27, 47 y.o.   MRN: [id number]     Renee Erb, is a 47 y.o. female  ASN:[id number]  LPF:[id number]  DOB - 24-Dec-1970  Subjective:  Chief Complaint and HPI: Hannah Baldwin is a 47 y.o. female here today 2 weeks h/o on and off hoarseness..  Some cough.  Throat is burning.  No f/c. Also having SOB and wheezing.  Quit smoking about 1 year ago.  Hasn't used inhaler.    Compliant with DM meds.    ROS:   Constitutional:  No f/c, No night sweats, No unexplained weight loss. EENT:  No vision changes, No blurry vision, No hearing changes. +ST, hoarseness Respiratory: +mild cough, + SOB, +wheezing Cardiac: No CP, no palpitations GI:  No abd pain, No N/V/D. GU: No Urinary s/sx Musculoskeletal: No joint pain Neuro: No headache, no dizziness, no motor weakness.  Skin: No rash Endocrine:  No polydipsia. No polyuria.  Psych: Denies SI/HI  No problems updated.  ALLERGIES: No Known Allergies  PAST MEDICAL HISTORY: Past Medical History:  Diagnosis Date  . Anemia   . Asthma Dx 2014  . Diabetes mellitus without complication ([place])   . 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 Pneumonia ~ 2000 X 1    MEDICATIONS AT HOME: Prior to Admission medications   Medication Sig Start Date End Date Taking? Authorizing Provider  acetaminophen (TYLENOL) 325 MG tablet Take 2 tablets (650 mg total) by mouth every 8 (eight) hours. 04/09/17   Ladell Pier, MD  albuterol (PROVENTIL HFA;VENTOLIN HFA) 108 (90 Base) MCG/ACT inhaler Inhale 2 puffs into the lungs every 6 (six) hours as needed for wheezing or shortness of breath. 04/09/17   Ladell Pier, MD  albuterol (PROVENTIL) (2.5 MG/3ML) 0.083% nebulizer solution Take 2.5 mg by nebulization every 6 (six) hours as needed for wheezing or shortness of breath.    [provider]  azithromycin (ZITHROMAX) 250 MG tablet Take 2 today then 1 daily 01/01/18   Argentina Donovan,  PA-C  blood glucose meter kit and supplies KIT Dispense based on patient and insurance preference. Use up to four times daily as directed. (FOR ICD-9 250.00, 250.01). 01/25/15   Charlott Rakes, MD  Blood Glucose Monitoring Suppl (TRUE METRIX METER) w/Device KIT USE AS INSTRUCTED 09/24/17   Ladell Pier, MD  DULoxetine (CYMBALTA) 60 MG capsule Take 1 capsule (60 mg total) by mouth daily. 09/24/17   Ladell Pier, MD  ferrous sulfate 325 (65 FE) MG tablet Take 1 tablet (325 mg total) by mouth 2 (two) times daily with a meal. 11/18/17   Ladell Pier, MD  furosemide (LASIX) 20 MG tablet Take 1 tablet (20 mg total) by mouth daily. For swelling in legs 04/09/17   Ladell Pier, MD  gabapentin (NEURONTIN) 100 MG capsule Take 1-3 capsules (100-300 mg total) by mouth at bedtime. 09/24/17   Ladell Pier, MD  glucose blood (TRUE METRIX BLOOD GLUCOSE TEST) test strip Use as instructed 09/24/17   Ladell Pier, MD  megestrol (MEGACE) 40 MG tablet TAKE 1-2 TABLETS (40-80 MG TOTAL) BY MOUTH 2 (TWO) TIMES DAILY. 09/23/17   Ladell Pier, MD  meloxicam (MOBIC) 15 MG tablet TAKE 1 TABLET BY MOUTH DAILY. 03/26/17   Funches, Adriana Mccallum, MD  metFORMIN (GLUCOPHAGE) 500 MG tablet TAKE 1 TABLET BY MOUTH 2 TIMES DAILY. 08/28/17   Ladell Pier, MD  methadone (METHADOSE) 40 MG disintegrating tablet Take 45 mg by mouth daily.    [provider]  mometasone-formoterol (DULERA) 100-5 MCG/ACT AERO Inhale 2 puffs into the lungs 2 (two) times daily. 11/15/17   Ladell Pier, MD  nystatin cream (MYCOSTATIN) Apply to affected area BID 11/18/17   Ladell Pier, MD  predniSONE (DELTASONE) 10 MG tablet 6,5,4,3,2,1 take each days dose all at once in the morning with food 01/01/18   Argentina Donovan, PA-C  traZODone (DESYREL) 50 MG tablet Take 0.5-1 tablets (25-50 mg total) by mouth at bedtime as needed for sleep. 03/16/16   Boykin Nearing, MD  TRUEPLUS LANCETS 28G MISC Check blood sugar  TID & QHS 09/24/17   Ladell Pier, MD     Objective:  EXAM:   Vitals:   01/01/18 1407  BP: (!) 142/77  Pulse: 72  Resp: 15  Temp: 98.4 F (36.9 C)  TempSrc: Oral  SpO2: 98%  Weight: (!) 329 lb 9.6 oz (149.5 kg)    General appearance : A&OX3. NAD. Non-toxic-appearing HEENT: Atraumatic and Normocephalic.  PERRLA. EOM intact.  TM clear B. Mouth-MMM, post pharynx WNL w/o erythema, No PND. Neck: supple, no JVD. No cervical lymphadenopathy. No thyromegaly Chest/Lungs:  Breathing-non-labored, Good air entry bilaterally, breath sounds normal without rales, rhonchi, or wheezing  CVS: S1 S2 regular, no murmurs, gallops, rubs  Abdomen: Bowel sounds present, Non tender and not distended with no gaurding, rigidity or rebound. Extremities: Bilateral Lower Ext shows no edema, both legs are warm to touch with = pulse throughout Neurology:  CN II-XII grossly intact, Non focal.   Psych:  TP linear. J/I WNL. Normal speech. Appropriate eye contact and affect.  Skin:  No Rash  Data Review Lab Results  Component Value Date   HGBA1C 5.6 01/01/2018   HGBA1C 6.0 (H) 04/09/2017   HGBA1C 6.3 12/20/2016     Assessment & Plan   1. Controlled type 2 diabetes mellitus with diabetic polyneuropathy, without long-term current use of insulin (HCC) Controlled-continue current regimen - POCT glucose (manual entry) - POCT glycosylated hemoglobin (Hb A1C)  2. Hoarseness Salt water gargles tid  3. Cough Cover for atypicals - azithromycin (ZITHROMAX) 250 MG tablet; Take 2 today then 1 daily  Dispense: 6 tablet; Refill: 0  4. Moderate persistent asthma without complication - albuterol (PROVENTIL) (2.5 MG/3ML) 0.083% nebulizer solution 2.5 mg-improved after breathing treatment - methylPREDNISolone sodium succinate (SOLU-MEDROL) 125 mg/2 mL injection 125 mg - predniSONE (DELTASONE) 10 MG tablet; 6,5,4,3,2,1 take each days dose all at once in the morning with food  Dispense: 21 tablet; Refill:  0   Patient have been counseled extensively about nutrition and exercise  Return in about 3 months (around 04/03/2018) for Dr Wynetta Emery, DM and asthma.  The patient was given clear instructions to go to ER or return to medical center if symptoms don't improve, worsen or new problems develop. The patient verbalized understanding. The patient was told to call to get lab results if they haven't heard anything in the next week.     Freeman Caldron, PA-C [facility] and [place], [place]   01/01/2018, 2:50 PM

## 2018-01-02 ENCOUNTER — Encounter: Payer: Self-pay | Admitting: Physical Therapy

## 2018-01-02 ENCOUNTER — Ambulatory Visit: Payer: Self-pay | Admitting: Physical Therapy

## 2018-01-02 DIAGNOSIS — M25562 Pain in left knee: Principal | ICD-10-CM

## 2018-01-02 DIAGNOSIS — G8929 Other chronic pain: Secondary | ICD-10-CM

## 2018-01-02 DIAGNOSIS — M25662 Stiffness of left knee, not elsewhere classified: Secondary | ICD-10-CM

## 2018-01-02 DIAGNOSIS — M6281 Muscle weakness (generalized): Secondary | ICD-10-CM

## 2018-01-02 NOTE — Therapy (Signed)
[place], [place], [address] Phone: [phone]   Fax:  [phone]  Physical Therapy Treatment  Patient Details  Name: Hannah Baldwin MRN: [id number] Date of Birth: 06-07-1971 No data recorded  Encounter Date: 01/02/2018  PT End of Session - 01/02/18 1015    Visit Number  7    Number of Visits  12    Date for PT Re-Evaluation  01/14/18    PT Start Time  1010    PT Stop Time  1055    PT Time Calculation (min)  45 min       Past Medical History:  Diagnosis Date  . Anemia   . Asthma Dx 2014  . Diabetes mellitus without complication ([place])   . 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 Pneumonia ~ 2000 X 1    Past Surgical History:  Procedure Laterality Date  . CESAREAN SECTION  1992    There were no vitals filed for this visit.  Subjective Assessment - 01/02/18 1013    Subjective  I got a steroid injection and going to pick up antibiotic for a respiratory infection.     Currently in Pain?  No/denies                No data recorded       [facility] Adult PT Treatment/Exercise - 01/02/18 0001      Knee/Hip Exercises: Aerobic   Nustep  L4 UE/LE x 5 min      Knee/Hip Exercises: Machines for Strengthening   Cybex Knee Extension  10 lbs 2 x 15 reps     Cybex Knee Flexion  20 lbs 2 x 20 reps       Knee/Hip Exercises: Standing   Heel Raises  20 reps    Knee Flexion  Strengthening;Both;1 set;20 reps;Other (comment) high march 4 lbs     Hip Extension  Stengthening;Both;1 set;20 reps    Extension Limitations  hamstring curl 4 lbs each     Forward Step Up  Both;1 set;20 reps;Step Height: 6"      Knee/Hip Exercises: Seated   Long Arc Quad  20 reps    Long Arc Quad Weight  4 lbs.      Knee/Hip Exercises: Supine   Short Arc Quad Sets  2 sets;10 reps with ball squeeze    Bridges  10 reps    Bridges with [employer]  10 reps    Straight Leg Raises  Left;10 reps       Ultrasound   Ultrasound Location  medial knee    Ultrasound Parameters  1.2 w/cm2 x 8 min    Ultrasound Goals  Pain                  PT Long Term Goals - 12/19/17 1045      PT LONG TERM GOAL #1   Title  Pt will be I with HEP for LE strength, endurance, flexibility     Status  On-going      PT LONG TERM GOAL #2   Title  Pt will be able to walk normalized gait speed (< 2.62 feet per sec with improved confidence     Status  On-going      PT LONG TERM GOAL #3   Title  Pt will demo strength in hips 4+/5 or more for maximal knee support with gait, transfers.     Status  On-going      PT LONG TERM GOAL #4   Title  FOTO score will improve to <40% to show functional mobility improvement.     Status  On-going            Plan - 01/02/18 1248    Clinical Impression Statement  Increased closed chain exercises with good tolerance. Monitiored SP02 due to SOB, remained stable. She recieved cortisone injection yesterday for respiratory infection which may contribute to decreased pain today.    PT Next Visit Plan  try sidelying.  progress quad strength, ROM, hip, continue Nustep . Continue [place] if helpful, ionto?    PT Home Exercise Plan  SLR flexion and abduction, quad set, sit to stand    Consulted and Agree with Plan of Care  Patient       Patient will benefit from skilled therapeutic intervention in order to improve the following deficits and impairments:  Pain, Impaired flexibility, Increased fascial restricitons, Decreased strength, Decreased activity tolerance, Obesity, Decreased range of motion, Difficulty walking, Hypomobility, Decreased balance, Decreased endurance, Decreased mobility  Visit Diagnosis: Chronic pain of left knee  Muscle weakness (generalized)  Stiffness of left knee, not elsewhere classified     Problem List Patient Active Problem List   Diagnosis Date Noted  . Primary osteoarthritis of left knee 04/09/2017  . Insomnia 11/03/2015  .  Onychomycosis of toenail 07/25/2015  . Diabetic neuropathy ([place]) 07/25/2015  . DUB (dysfunctional uterine bleeding) 02/03/2015  . Diabetes type 2, controlled ([place]) 01/25/2015  . Severe obesity (BMI >= 40) ([place]) 01/25/2015  . Moderate persistent asthma 01/21/2015  . COPD (chronic obstructive pulmonary disease) ([place]) 01/20/2015  . Cardiomegaly 06/09/2014  . Hypochromic microcytic anemia 06/08/2014  . Former smoker 01/01/2013    Dorene Ar, PTA 01/02/2018, 12:53 PM  [place], [place], [address] Phone: [phone]   Fax:  [phone]  Name: JOEL MERICLE MRN: [id number] Date of Birth: 03/15/1971

## 2018-01-06 ENCOUNTER — Encounter: Payer: Self-pay | Admitting: Internal Medicine

## 2018-01-06 ENCOUNTER — Other Ambulatory Visit: Payer: Self-pay

## 2018-01-06 ENCOUNTER — Ambulatory Visit: Payer: Self-pay | Attending: Internal Medicine | Admitting: Internal Medicine

## 2018-01-06 VITALS — BP 144/91 | HR 78 | Temp 98.4°F | Resp 16 | Wt 325.0 lb

## 2018-01-06 DIAGNOSIS — M1712 Unilateral primary osteoarthritis, left knee: Secondary | ICD-10-CM | POA: Insufficient documentation

## 2018-01-06 DIAGNOSIS — B3781 Candidal esophagitis: Secondary | ICD-10-CM | POA: Insufficient documentation

## 2018-01-06 DIAGNOSIS — D509 Iron deficiency anemia, unspecified: Secondary | ICD-10-CM | POA: Insufficient documentation

## 2018-01-06 DIAGNOSIS — J449 Chronic obstructive pulmonary disease, unspecified: Secondary | ICD-10-CM | POA: Insufficient documentation

## 2018-01-06 DIAGNOSIS — B370 Candidal stomatitis: Secondary | ICD-10-CM | POA: Insufficient documentation

## 2018-01-06 DIAGNOSIS — E1140 Type 2 diabetes mellitus with diabetic neuropathy, unspecified: Secondary | ICD-10-CM | POA: Insufficient documentation

## 2018-01-06 DIAGNOSIS — J4540 Moderate persistent asthma, uncomplicated: Secondary | ICD-10-CM | POA: Insufficient documentation

## 2018-01-06 DIAGNOSIS — R490 Dysphonia: Secondary | ICD-10-CM | POA: Insufficient documentation

## 2018-01-06 DIAGNOSIS — Z87891 Personal history of nicotine dependence: Secondary | ICD-10-CM | POA: Insufficient documentation

## 2018-01-06 DIAGNOSIS — I517 Cardiomegaly: Secondary | ICD-10-CM | POA: Insufficient documentation

## 2018-01-06 DIAGNOSIS — Z7984 Long term (current) use of oral hypoglycemic drugs: Secondary | ICD-10-CM | POA: Insufficient documentation

## 2018-01-06 DIAGNOSIS — Z6841 Body Mass Index (BMI) 40.0 and over, adult: Secondary | ICD-10-CM | POA: Insufficient documentation

## 2018-01-06 DIAGNOSIS — Z79899 Other long term (current) drug therapy: Secondary | ICD-10-CM | POA: Insufficient documentation

## 2018-01-06 MED ORDER — FLUCONAZOLE 200 MG PO TABS: ORAL_TABLET | ORAL | 0 refills | Status: DC

## 2018-01-06 MED FILL — FLUCONAZOLE 200 MG TAB: 200 | 9 days supply | Qty: 10 | Fill #0

## 2018-01-06 NOTE — Patient Instructions (Signed)
Please rinse your mouth after each use of your steroid inhaler.   Oral Thrush, Adult Oral thrush is an infection in your mouth and throat. It causes white patches on your tongue and in your mouth. Follow these instructions at home: Helping with soreness  To lessen your pain: ? Drink cold liquids, like water and iced tea. ? Eat frozen ice pops or frozen juices. ? Eat foods that are easy to swallow, like gelatin and ice cream. ? Drink from a straw if the patches in your mouth are painful. General instructions   Take or use over-the-counter and prescription medicines only as told by your doctor. Medicine for oral thrush may be something to swallow, or it may be something to put on the infected area.  Eat plain yogurt that has live cultures in it. Read the label to make sure.  If you wear dentures: ? Take out your dentures before you go to bed. ? Brush them well. ? Soak them in a denture cleaner.  Rinse your mouth with warm salt-water many times a day. To make the salt-water mixture, completely dissolve 1/2-1 teaspoon of salt in 1 cup of warm water. Contact a doctor if:  Your problems are getting worse.  Your problems do not get better in less than 7 days with treatment.  Your infection is spreading. This may show as white patches on the skin outside of your mouth.  You are nursing your baby and you have redness and pain in the nipples. This information is not intended to replace advice given to you by your health care provider. Make sure you discuss any questions you have with your health care provider. Document Released: 12/19/2009 Document Revised: 06/18/2016 Document Reviewed: 06/18/2016 Elsevier Interactive Patient Education  2017 [employer].

## 2018-01-06 NOTE — Progress Notes (Signed)
Patient ID: RORIE DELMORE, female    DOB: 05-Oct-1971  MRN: [id number]  CC: Sore Throat   Subjective: Hannah Baldwin is a 47 y.o. female who presents for UC visit Her concerns today include:  Patient with history of diabetes type 2 with neuropathy, obesity, mod persistent asthma, osteoarthritis of the left knee,iron deficiency anemia,DUB with fibroid, opioidaddictionon Methadone.Pos depression screen 07/2017.  Pt c/o hoarsenss x 3 wks on and off.  Feel like something in throat and endorses soreness and painful swallowing.  Seen last week by PA for similar symptoms.  She was assessed to have asthma exacerbation and was given Solu-Medrol shot with prednisone taper.  She is on the last day of prednisone.  She is also on the last day of Zithromax Cough and breathing are better  Patient Active Problem List   Diagnosis Date Noted  . Primary osteoarthritis of left knee 04/09/2017  . Insomnia 11/03/2015  . Onychomycosis of toenail 07/25/2015  . Diabetic neuropathy ([place]) 07/25/2015  . DUB (dysfunctional uterine bleeding) 02/03/2015  . Diabetes type 2, controlled ([place]) 01/25/2015  . Severe obesity (BMI >= 40) ([place]) 01/25/2015  . Moderate persistent asthma 01/21/2015  . COPD (chronic obstructive pulmonary disease) ([place]) 01/20/2015  . Cardiomegaly 06/09/2014  . Hypochromic microcytic anemia 06/08/2014  . Former smoker 01/01/2013     Current Outpatient Medications on File Prior to Visit  Medication Sig Dispense Refill  . acetaminophen (TYLENOL) 325 MG tablet Take 2 tablets (650 mg total) by mouth every 8 (eight) hours. 60 tablet 4  . albuterol (PROVENTIL HFA;VENTOLIN HFA) 108 (90 Base) MCG/ACT inhaler Inhale 2 puffs into the lungs every 6 (six) hours as needed for wheezing or shortness of breath. 3 Inhaler 5  . albuterol (PROVENTIL) (2.5 MG/3ML) 0.083% nebulizer solution Take 2.5 mg by nebulization every 6 (six) hours as needed for wheezing or shortness of breath.    [place] Kitchen azithromycin  (ZITHROMAX) 250 MG tablet Take 2 today then 1 daily 6 tablet 0  . blood glucose meter kit and supplies KIT Dispense based on patient and insurance preference. Use up to four times daily as directed. (FOR ICD-9 250.00, 250.01). 1 each 0  . Blood Glucose Monitoring Suppl (TRUE METRIX METER) w/Device KIT USE AS INSTRUCTED 1 kit 0  . DULoxetine (CYMBALTA) 60 MG capsule Take 1 capsule (60 mg total) by mouth daily. 30 capsule 6  . ferrous sulfate 325 (65 FE) MG tablet Take 1 tablet (325 mg total) by mouth 2 (two) times daily with a meal. 120 tablet 2  . furosemide (LASIX) 20 MG tablet Take 1 tablet (20 mg total) by mouth daily. For swelling in legs 30 tablet 3  . gabapentin (NEURONTIN) 100 MG capsule Take 1-3 capsules (100-300 mg total) by mouth at bedtime. 90 capsule 2  . glucose blood (TRUE METRIX BLOOD GLUCOSE TEST) test strip Use as instructed 100 each 12  . megestrol (MEGACE) 40 MG tablet TAKE 1-2 TABLETS (40-80 MG TOTAL) BY MOUTH 2 (TWO) TIMES DAILY. 120 tablet 1  . meloxicam (MOBIC) 15 MG tablet TAKE 1 TABLET BY MOUTH DAILY. 30 tablet 2  . metFORMIN (GLUCOPHAGE) 500 MG tablet TAKE 1 TABLET BY MOUTH 2 TIMES DAILY. 60 tablet 2  . methadone (METHADOSE) 40 MG disintegrating tablet Take 45 mg by mouth daily.    . mometasone-formoterol (DULERA) 100-5 MCG/ACT AERO Inhale 2 puffs into the lungs 2 (two) times daily. 13 g 6  . nystatin cream (MYCOSTATIN) Apply to affected area BID 30 g  0  . predniSONE (DELTASONE) 10 MG tablet 6,5,4,3,2,1 take each days dose all at once in the morning with food 21 tablet 0  . traZODone (DESYREL) 50 MG tablet Take 0.5-1 tablets (25-50 mg total) by mouth at bedtime as needed for sleep. 30 tablet 0  . TRUEPLUS LANCETS 28G MISC Check blood sugar TID & QHS 100 each 2   Current Facility-Administered Medications on File Prior to Visit  Medication Dose Route Frequency Provider Last Rate Last Dose  . albuterol (PROVENTIL) (2.5 MG/3ML) 0.083% nebulizer solution 2.5 mg  2.5 mg  Nebulization Once McClung, Angela M, PA-C        No Known Allergies  Social History   Socioeconomic History  . Marital status: Widowed    Spouse name: Not on file  . Number of children: Not on file  . Years of education: Not on file  . Highest education level: Not on file  Occupational History  . Not on file  Social Needs  . Financial resource strain: Not on file  . Food insecurity:    Worry: Not on file    Inability: Not on file  . Transportation needs:    Medical: Not on file    Non-medical: Not on file  Tobacco Use  . Smoking status: Former Smoker    Packs/day: 0.25    Years: 20.00    Pack years: 5.00    Types: Cigarettes    Last attempt to quit: 01/18/2015    Years since quitting: 2.9  . Smokeless tobacco: Never Used  Substance and Sexual Activity  . Alcohol use: Yes    Comment: 01/20/2015 "dank some alcohol on my birthday, cookouts,  etc,;  maybe once/month"  . Drug use: No  . Sexual activity: Not Currently    Birth control/protection: None  Lifestyle  . Physical activity:    Days per week: Not on file    Minutes per session: Not on file  . Stress: Not on file  Relationships  . Social connections:    Talks on phone: Not on file    Gets together: Not on file    Attends religious service: Not on file    Active member of club or organization: Not on file    Attends meetings of clubs or organizations: Not on file    Relationship status: Not on file  . Intimate partner violence:    Fear of current or ex partner: Not on file    Emotionally abused: Not on file    Physically abused: Not on file    Forced sexual activity: Not on file  Other Topics Concern  . Not on file  Social History Narrative  . Not on file    Family History  Problem Relation Age of Onset  . Asthma Mother   . Asthma Daughter     Past Surgical History:  Procedure Laterality Date  . CESAREAN SECTION  1992    ROS: Review of Systems As above  PHYSICAL EXAM: BP (!) 144/91   Pulse  78   Temp 98.4 F (36.9 C) (Oral)   Resp 16   Wt (!) 325 lb (147.4 kg)   SpO2 98%   BMI 56.67 kg/m   Wt Readings from Last 3 Encounters:  01/06/18 (!) 325 lb (147.4 kg)  01/01/18 (!) 329 lb 9.6 oz (149.5 kg)  11/18/17 (!) 330 lb (149.7 kg)    Physical Exam  General appearance - alert, well appearing, and in no distress.  Mild audible hoarsiness Mental  status - alert, oriented to person, place, and time, normal mood, behavior, speech, dress, motor activity, and thought processes Mouth - + thrush on soft palate, both tonsils and noted lining the back of the throat Chest - scattered wheezes Heart - normal rate, regular rhythm, normal S1, S2, no murmurs, rubs, clicks or gallops  EKG: Normal sinus rhythm.  No ischemic changes.  Corrected QT of 425 ms ASSESSMENT AND PLAN: 1. Thrush of mouth and esophagus ([place]) Patient admits that she does not gargle with warm water after each use of Dulera inhaler.  I encouraged her to do so. Given her symptoms and exam findings of thrush noted lining the back of the throat, I think it probably extends to her esophagus and she would need systemic antifungal rather than topical.  However patient is on methadone which can in combination with an Azole cause prolongation of the QT interval and cause arrhythmia.  I obtained a baseline EKG today her corrected QT is within normal range.  I discussed this possible interaction with patient.  However given that she will not be on, antifungal long-term I have decided to go ahead and put her on fluconazole for total of 9 days. - HIV antibody  2. Hoarseness Gargle with warm water.  Voice rest.  Follow-up in 2 weeks post treatment for thrush.  If it still persists, I will refer her to ENT  Patient was given the opportunity to ask questions.  Patient verbalized understanding of the plan and was able to repeat key elements of the plan.   Orders Placed This Encounter  Procedures  . HIV antibody     Requested  Prescriptions   Signed Prescriptions Disp Refills  . fluconazole (DIFLUCAN) 200 MG tablet 10 tablet 0    Sig: 2 tabs PO x 1 day then 1 tab daily x 8 days    Return in about 2 weeks (around 01/20/2018).  Karle Plumber, MD, FACP

## 2018-01-07 ENCOUNTER — Encounter: Payer: Self-pay | Admitting: Physical Therapy

## 2018-01-07 ENCOUNTER — Ambulatory Visit: Payer: Self-pay | Attending: Orthopaedic Surgery | Admitting: Physical Therapy

## 2018-01-07 DIAGNOSIS — M6281 Muscle weakness (generalized): Secondary | ICD-10-CM | POA: Insufficient documentation

## 2018-01-07 DIAGNOSIS — M25662 Stiffness of left knee, not elsewhere classified: Secondary | ICD-10-CM | POA: Insufficient documentation

## 2018-01-07 DIAGNOSIS — G8929 Other chronic pain: Secondary | ICD-10-CM | POA: Insufficient documentation

## 2018-01-07 DIAGNOSIS — M25562 Pain in left knee: Secondary | ICD-10-CM | POA: Insufficient documentation

## 2018-01-07 LAB — HIV ANTIBODY (ROUTINE TESTING W REFLEX): HIV SCREEN 4TH GENERATION: NONREACTIVE

## 2018-01-07 NOTE — Therapy (Signed)
[place], [place], [address] Phone: [phone]   Fax:  [phone]  Physical Therapy Treatment  Patient Details  Name: Hannah Baldwin MRN: [id number] Date of Birth: [birth year] No data recorded  Encounter Date: 01/07/2018  PT End of Session - 01/07/18 1156    Visit Number  8    Number of Visits  12    Date for PT Re-Evaluation  01/14/18    PT Start Time  1150    PT Stop Time  1230    PT Time Calculation (min)  40 min       Past Medical History:  Diagnosis Date  . Anemia   . Asthma Dx 2014  . Diabetes mellitus without complication ([place])   . 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 Pneumonia ~ 2000 X 1    Past Surgical History:  Procedure Laterality Date  . CESAREAN SECTION  1992    There were no vitals filed for this visit.  Subjective Assessment - 01/07/18 1154    Subjective  Got a new med for my respiratory infection. Was having trouble eating and drinking. Saw MD again yesterday.     Currently in Pain?  Yes    Pain Score  4     Pain Location  Knee    Pain Orientation  Left    Pain Descriptors / Indicators  Discomfort hurting pain    Aggravating Factors   weather     Pain Relieving Factors  shots, meds, ultrasound         [facility] PT Assessment - 01/07/18 0001      Observation/Other Assessments   Focus on Therapeutic Outcomes (FOTO)   44% limited improved from 53% limited.                    [facility] Adult PT Treatment/Exercise - 01/07/18 0001      Knee/Hip Exercises: Aerobic   Nustep  L4 UE/LE x 5 min      Knee/Hip Exercises: Standing   Knee Flexion  Strengthening;Both;1 set;20 reps;Other (comment) high march 4 lbs     Hip Extension  15 reps 4#    Lateral Step Up  10 reps;Hand Hold: 1;Step Height: 4"    Forward Step Up  15 reps;Hand Hold: 1;Step Height: 6"      Knee/Hip Exercises: Seated   Long Arc Quad  20 reps    Long Arc Quad Weight  5  lbs.    Sit to Sand  10 reps;without UE support      Knee/Hip Exercises: Supine   Short Arc Quad Sets  2 sets;10 reps with ball squeeze    Straight Leg Raises  Left;10 reps      Ultrasound   Ultrasound Location  medial knee    Ultrasound Parameters  1.2 w/cm2 x 8 min    Ultrasound Goals  Pain                  PT Long Term Goals - 01/07/18 1539      PT LONG TERM GOAL #1   Title  Pt will be I with HEP for LE strength, endurance, flexibility     Time  6    Period  Weeks    Status  On-going      PT LONG TERM GOAL #2   Title  Pt will be able to walk normalized gait speed (<  2.62 feet per sec with improved confidence     Time  6    Period  Weeks    Status  Unable to assess      PT LONG TERM GOAL #3   Title  Pt will demo strength in hips 4+/5 or more for maximal knee support with gait, transfers.     Time  6    Period  Weeks    Status  On-going      PT LONG TERM GOAL #4   Title  FOTO score will improve to <40% to show functional mobility improvement.     Baseline  44%     Time  6    Status  On-going            Plan - 01/07/18 1537    Clinical Impression Statement  4/10 pain today, Tried lateral step up with increased pain. Continued quad strengthening. [place] per pt request. FOTO score improved. She reports overall decreased pain and feels more confident with gait.     PT Next Visit Plan  try sidelying.  progress quad strength, ROM, hip, continue Nustep . Continue [place] if helpful, ionto? ERO      PT Home Exercise Plan  SLR flexion and abduction, quad set, sit to stand    Consulted and Agree with Plan of Care  Patient       Patient will benefit from skilled therapeutic intervention in order to improve the following deficits and impairments:  Pain, Impaired flexibility, Increased fascial restricitons, Decreased strength, Decreased activity tolerance, Obesity, Decreased range of motion, Difficulty walking, Hypomobility, Decreased balance, Decreased endurance,  Decreased mobility  Visit Diagnosis: Chronic pain of left knee  Muscle weakness (generalized)  Stiffness of left knee, not elsewhere classified     Problem List Patient Active Problem List   Diagnosis Date Noted  . Primary osteoarthritis of left knee 04/09/2017  . Insomnia 11/03/2015  . Onychomycosis of toenail 07/25/2015  . Diabetic neuropathy ([place]) 07/25/2015  . DUB (dysfunctional uterine bleeding) 02/03/2015  . Diabetes type 2, controlled ([place]) 01/25/2015  . Severe obesity (BMI >= 40) ([place]) 01/25/2015  . Moderate persistent asthma 01/21/2015  . COPD (chronic obstructive pulmonary disease) ([place]) 01/20/2015  . Cardiomegaly 06/09/2014  . Hypochromic microcytic anemia 06/08/2014  . Former smoker 01/01/2013    Dorene Ar, PTA 01/07/2018, 3:43 PM  [place], [place], [address] Phone: [phone]   Fax:  [phone]  Name: JESSICAANN OVERBAUGH MRN: [id number] Date of Birth: Jun 18, 1971

## 2018-01-08 ENCOUNTER — Telehealth: Payer: Self-pay

## 2018-01-08 NOTE — Telephone Encounter (Signed)
Contacted pt to go over lab results pt is aware and doesn't have any questions or concerns 

## 2018-01-09 ENCOUNTER — Ambulatory Visit (INDEPENDENT_AMBULATORY_CARE_PROVIDER_SITE_OTHER): Payer: Self-pay | Admitting: Obstetrics & Gynecology

## 2018-01-09 ENCOUNTER — Encounter: Payer: Self-pay | Admitting: Obstetrics & Gynecology

## 2018-01-09 VITALS — BP 148/94 | HR 78 | Wt 323.6 lb

## 2018-01-09 DIAGNOSIS — N938 Other specified abnormal uterine and vaginal bleeding: Secondary | ICD-10-CM

## 2018-01-09 DIAGNOSIS — D259 Leiomyoma of uterus, unspecified: Secondary | ICD-10-CM

## 2018-01-09 MED ORDER — MEGESTROL ACETATE 40 MG PO TABS: 40.0000 mg | ORAL_TABLET | Freq: Two times a day (BID) | ORAL | 1 refills | Status: DC

## 2018-01-09 NOTE — Progress Notes (Signed)
History:  [name] is a 47 y.o. G2P0002 who presents to clinic today for AUB and uterine fibroids. She has had heavy, irregular periods for the past ~20 years. She has been on Megace which controls her bleeding somewhat, but has noticed her periods have gotten worse in the past few months.  She has to change pads as frequently as every 10 minutes. Her periods are irregular. They can last as long as two weeks or just every other day. She got a Mirena placed in 2017 which was expelled within two weeks, didn't follow up. Has not had pregnancy in ~12yrs without contraception but denies intercourse. She denies significant pelvic pain other than some cramping.   The following portions of the patient's history were reviewed and updated as appropriate: allergies, current medications, family history, past medical history, social history, past surgical history and problem list.  Review of Systems:  Review of Systems  Genitourinary:       +heavy periods  Endo/Heme/Allergies: Does not bruise/bleed easily.      Objective:  Physical Exam Wt (!) 323 lb 9.6 oz (146.8 kg)   LMP  (LMP Unknown)   BMI 56.42 kg/m  Physical Exam  Constitutional: She appears well-developed and well-nourished.  Cardiovascular: Normal rate.  Pulmonary/Chest: Effort normal.  Genitourinary: Uterus normal. Cervix exhibits no motion tenderness and no friability. Right adnexum displays no mass and no fullness. Left adnexum displays no mass and no fullness.  Psychiatric: She has a normal mood and affect.   Labs and Imaging No results found for this or any previous visit (from the past 24 hour(s)).  11/02/15: Uterus Measurements: 12.2 x 6.3 x 8.7 cm. There is a fibroid identified within the left side of the uterus measuring 2.7 x 2.5 x 2.6 cm. Endometrium Thickness: 16 mm.  No focal abnormality visualized. Right ovary Measurements: 2.6 x 2.2 x 2.1 cm. Normal appearance/no adnexal mass. Left ovary Measurements:  2.6 x 1.9 x 1.8 cm. Normal appearance/no adnexal mass. Other findings No abnormal free fluid.  CBC    Component Value Date/Time   WBC 6.5 11/18/2017 1144   WBC 8.6 12/20/2016 1048   RBC 4.32 11/18/2017 1144   RBC 4.48 12/20/2016 1048   HGB 10.7 (L) 11/18/2017 1144   HCT 34.3 11/18/2017 1144   PLT 462 (H) 11/18/2017 1144   MCV 79 11/18/2017 1144   MCH 24.8 (L) 11/18/2017 1144   MCH 22.8 (L) 12/20/2016 1048   MCHC 31.2 (L) 11/18/2017 1144   MCHC 29.8 (L) 12/20/2016 1048   RDW 16.9 (H) 11/18/2017 1144   LYMPHSABS 2.4 03/08/2017 1101   MONOABS 516 12/20/2016 1048   EOSABS 0.2 03/08/2017 1101   BASOSABS 0.0 03/08/2017 1101    Assessment & Plan:  1. DUB (dysfunctional uterine bleeding) -Most likely related to anovulatory cycles. Will defer endometrial biopsy and pap smear at this time since the patient doesn't have insurance -Will provide information on free pap clinics.  -Increase Megace from daily to bid.   2. Uterine leiomyoma, unspecified location -Plan repeat pelvic ultrasound to evaluate fibroid.    Tor Netters, Medical Student 01/09/18 9:48

## 2018-01-09 NOTE — Patient Instructions (Signed)

## 2018-01-13 ENCOUNTER — Ambulatory Visit (HOSPITAL_COMMUNITY)
Admission: RE | Admit: 2018-01-13 | Discharge: 2018-01-13 | Disposition: A | Discharge status: Home / Self Care | Payer: Self-pay | Source: Ambulatory Visit | Attending: Obstetrics & Gynecology | Admitting: Obstetrics & Gynecology

## 2018-01-13 DIAGNOSIS — N938 Other specified abnormal uterine and vaginal bleeding: Secondary | ICD-10-CM | POA: Insufficient documentation

## 2018-01-13 DIAGNOSIS — D252 Subserosal leiomyoma of uterus: Secondary | ICD-10-CM | POA: Insufficient documentation

## 2018-01-13 DIAGNOSIS — D259 Leiomyoma of uterus, unspecified: Secondary | ICD-10-CM

## 2018-01-13 DIAGNOSIS — N926 Irregular menstruation, unspecified: Secondary | ICD-10-CM | POA: Insufficient documentation

## 2018-01-14 ENCOUNTER — Encounter: Payer: Self-pay | Admitting: Physical Therapy

## 2018-01-14 ENCOUNTER — Ambulatory Visit: Payer: Self-pay | Admitting: Physical Therapy

## 2018-01-14 DIAGNOSIS — M6281 Muscle weakness (generalized): Secondary | ICD-10-CM

## 2018-01-14 DIAGNOSIS — M25662 Stiffness of left knee, not elsewhere classified: Secondary | ICD-10-CM

## 2018-01-14 DIAGNOSIS — M25562 Pain in left knee: Principal | ICD-10-CM

## 2018-01-14 DIAGNOSIS — G8929 Other chronic pain: Secondary | ICD-10-CM

## 2018-01-14 NOTE — Therapy (Signed)
[place], [place], [address] Phone: [phone]   Fax:  [phone]  Physical Therapy Treatment  Patient Details  Name: Hannah Baldwin MRN: [id number] Date of Birth: 1971-07-27 No data recorded  Encounter Date: 01/14/2018  PT End of Session - 01/14/18 1204    Visit Number  9    Number of Visits  12    Date for PT Re-Evaluation  01/14/18    PT Start Time  1146    PT Stop Time  1225    PT Time Calculation (min)  39 min       Past Medical History:  Diagnosis Date  . Anemia   . Asthma Dx 2014  . Diabetes mellitus without complication ([place])   . 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 Pneumonia ~ 2000 X 1    Past Surgical History:  Procedure Laterality Date  . CESAREAN SECTION  1992    There were no vitals filed for this visit.  Subjective Assessment - 01/14/18 1150    Subjective  I fell right on my knees. Having more pain now. Tried to continue the exercises.     Currently in Pain?  Yes    Pain Score  6     Pain Location  Knee    Pain Orientation  Left    Pain Descriptors / Indicators  Sore    Aggravating Factors   fall    Pain Relieving Factors  rest                        [facility] Adult PT Treatment/Exercise - 01/14/18 0001      Knee/Hip Exercises: Supine   Short Arc Quad Sets  2 sets;10 reps with ball squeeze    Bridges  10 reps    Straight Leg Raises  Left;10 reps    Straight Leg Raise with External Rotation  10 reps    Other Supine Knee/Hip Exercises  ball squeeze       Modalities   Modalities  Iontophoresis      Ultrasound   Ultrasound Location  medial knee    Ultrasound Parameters  1.2 w/cm2 1 mhz x 8 min    Ultrasound Goals  Pain      Iontophoresis   Type of Iontophoresis  Dexamethasone    Location  medial left knee     Dose  1 ml    Time  6 hours                  PT Long Term Goals - 01/07/18 1539      PT LONG TERM  GOAL #1   Title  Pt will be I with HEP for LE strength, endurance, flexibility     Time  6    Period  Weeks    Status  On-going      PT LONG TERM GOAL #2   Title  Pt will be able to walk normalized gait speed (< 2.62 feet per sec with improved confidence     Time  6    Period  Weeks    Status  Unable to assess      PT LONG TERM GOAL #3   Title  Pt will demo strength in hips 4+/5 or more for maximal knee support with gait, transfers.     Time  6    Period  Weeks  Status  On-going      PT LONG TERM GOAL #4   Title  FOTO score will improve to <40% to show functional mobility improvement.     Baseline  44%     Time  6    Status  On-going            Plan - 01/14/18 1252    Clinical Impression Statement  Pt arrives with antalgic gait and reports fall 2 days ago onto her knees. Her left knee is now more painful. Reviewed her mat exercises and used ultrasound for pain releif. Started iontophoresis.     PT Next Visit Plan  How was ionto? ERO, try sidelying.  progress quad strength, ROM, hip, continue Nustep . Continue [place] if helpful, ionto? ERO      PT Home Exercise Plan  SLR flexion and abduction, quad set, sit to stand    Consulted and Agree with Plan of Care  Patient       Patient will benefit from skilled therapeutic intervention in order to improve the following deficits and impairments:  Pain, Impaired flexibility, Increased fascial restricitons, Decreased strength, Decreased activity tolerance, Obesity, Decreased range of motion, Difficulty walking, Hypomobility, Decreased balance, Decreased endurance, Decreased mobility  Visit Diagnosis: Chronic pain of left knee  Muscle weakness (generalized)  Stiffness of left knee, not elsewhere classified     Problem List Patient Active Problem List   Diagnosis Date Noted  . Fibroid uterus 01/09/2018  . Primary osteoarthritis of left knee 04/09/2017  . Insomnia 11/03/2015  . Onychomycosis of toenail 07/25/2015  . Diabetic  neuropathy ([place]) 07/25/2015  . DUB (dysfunctional uterine bleeding) 02/03/2015  . Diabetes type 2, controlled ([place]) 01/25/2015  . Severe obesity (BMI >= 40) ([place]) 01/25/2015  . Moderate persistent asthma 01/21/2015  . COPD (chronic obstructive pulmonary disease) ([place]) 01/20/2015  . Cardiomegaly 06/09/2014  . Hypochromic microcytic anemia 06/08/2014  . Former smoker 01/01/2013    Dorene Ar, PTA 01/14/2018, 1:01 PM  [place], [place], [address] Phone: [phone]   Fax:  [phone]  Name: Hannah Baldwin MRN: [id number] Date of Birth: 1971/07/01

## 2018-01-15 ENCOUNTER — Ambulatory Visit: Payer: Self-pay | Admitting: Physical Therapy

## 2018-01-15 DIAGNOSIS — M25662 Stiffness of left knee, not elsewhere classified: Secondary | ICD-10-CM

## 2018-01-15 DIAGNOSIS — M6281 Muscle weakness (generalized): Secondary | ICD-10-CM

## 2018-01-15 DIAGNOSIS — M25562 Pain in left knee: Principal | ICD-10-CM

## 2018-01-15 DIAGNOSIS — G8929 Other chronic pain: Secondary | ICD-10-CM

## 2018-01-15 NOTE — Therapy (Signed)
[place], [place], [address] Phone: [phone]   Fax:  [phone]  Physical Therapy Treatment  Patient Details  Name: Hannah Baldwin MRN: [id number] Date of Birth: July 22, 1971 No data recorded  Encounter Date: 01/15/2018  PT End of Session - 01/15/18 1021    Visit Number  10    Number of Visits  18    Date for PT Re-Evaluation  02/14/18    PT Start Time  1018    PT Stop Time  1053    PT Time Calculation (min)  35 min    Activity Tolerance  Patient tolerated treatment well    Behavior During Therapy  [facility] for tasks assessed/performed       Past Medical History:  Diagnosis Date  . Anemia   . Asthma Dx 2014  . Diabetes mellitus without complication ([place])   . 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 Pneumonia ~ 2000 X 1    Past Surgical History:  Procedure Laterality Date  . CESAREAN SECTION  1992    There were no vitals filed for this visit.  Subjective Assessment - 01/15/18 1022    Subjective  The patch fell off after 1 yr.  I want to keep coming because I had a set back.      Currently in Pain?  Yes    Pain Score  6     Pain Location  Knee    Pain Orientation  Left    Pain Descriptors / Indicators  Sore    Pain Type  Acute pain;Chronic pain    Pain Onset  More than a month ago    Pain Frequency  Intermittent         [facility] PT Assessment - 01/15/18 0001      Observation/Other Assessments   Focus on Therapeutic Outcomes (FOTO)   44% limited improved from 53% limited.       AROM   Left Knee Extension  0    Left Knee Flexion  104      Strength   Left Knee Flexion  4+/5 pain     Left Knee Extension  4+/5 pain                    [facility] Adult PT Treatment/Exercise - 01/15/18 0001      Knee/Hip Exercises: Aerobic   Nustep  5 min L5 LE only       Ultrasound   Ultrasound Location  L medial knee     Ultrasound Parameters  1.2 w/cm2 1 Mhz    Ultrasound Goals  Pain      Iontophoresis   Type of Iontophoresis  Dexamethasone    Location  medial left knee     Dose  1 ml    Time  6 hours                  PT Long Term Goals - 01/15/18 1048      PT LONG TERM GOAL #1   Title  Pt will be I with HEP for LE strength, endurance, flexibility     Baseline  up to date on current HEP, has decreased since her fall     Status  Partially Met      PT LONG TERM GOAL #2   Title  Pt will be able to walk normalized gait speed (< 2.62 feet per sec with improved  confidence     Baseline  walks slowly, slight limp     Status  On-going      PT LONG TERM GOAL #3   Title  Pt will demo strength in hips 4+/5 or more for maximal knee support with gait, transfers.     Baseline  4/5 pain     Status  On-going      PT LONG TERM GOAL #4   Title  FOTO score will improve to <40% to show functional mobility improvement.     Status  On-going            Plan - 01/15/18 1231    Clinical Impression Statement  Patient was renewed today for another month of PT. She was doing really well until she had a fall, reason was due to not lifting her leg high enough to clear the step in her home entrance.      PT Treatment/Interventions  ADLs/Self Care Home Management;Electrical Stimulation;Functional mobility training;Taping;Therapeutic activities;Iontophoresis 59m/ml Dexamethasone;Moist Heat;Cryotherapy;Ultrasound;Gait training;Stair training;Therapeutic exercise;DME Instruction;Balance training;Patient/family education;Manual techniques;Dry needling;Passive range of motion    PT Next Visit Plan  How was ionto?  try sidelying.  progress quad strength, ROM, hip, continue Nustep . Continue UKoreaif helpful, ionto?     PT Home Exercise Plan  SLR flexion and abduction, quad set, sit to stand    Consulted and Agree with Plan of Care  Patient       Patient will benefit from skilled therapeutic intervention in order to improve the following deficits and  impairments:  Pain, Impaired flexibility, Increased fascial restricitons, Decreased strength, Decreased activity tolerance, Obesity, Decreased range of motion, Difficulty walking, Hypomobility, Decreased balance, Decreased endurance, Decreased mobility  Visit Diagnosis: Chronic pain of left knee  Muscle weakness (generalized)  Stiffness of left knee, not elsewhere classified     Problem List Patient Active Problem List   Diagnosis Date Noted  . Fibroid uterus 01/09/2018  . Primary osteoarthritis of left knee 04/09/2017  . Insomnia 11/03/2015  . Onychomycosis of toenail 07/25/2015  . Diabetic neuropathy ([place] 07/25/2015  . DUB (dysfunctional uterine bleeding) 02/03/2015  . Diabetes type 2, controlled ([place] 01/25/2015  . Severe obesity (BMI >= 40) ([place] 01/25/2015  . Moderate persistent asthma 01/21/2015  . COPD (chronic obstructive pulmonary disease) ([place] 01/20/2015  . Cardiomegaly 06/09/2014  . Hypochromic microcytic anemia 06/08/2014  . Former smoker 01/01/2013    Hannah Baldwin 01/15/2018, 12:39 PM  [place] [address]Phone: [phone]  Fax:  [phone] Name: Hannah GOODGAMEMRN: [id number] of Birth: [birth year] JRaeford Razor PT 01/15/18 12:39 PM Phone: [phone]: [phone]

## 2018-01-21 ENCOUNTER — Encounter: Payer: Self-pay | Admitting: Physical Therapy

## 2018-01-21 ENCOUNTER — Ambulatory Visit: Payer: Self-pay | Admitting: Physical Therapy

## 2018-01-21 DIAGNOSIS — M6281 Muscle weakness (generalized): Secondary | ICD-10-CM

## 2018-01-21 DIAGNOSIS — G8929 Other chronic pain: Secondary | ICD-10-CM

## 2018-01-21 DIAGNOSIS — M25662 Stiffness of left knee, not elsewhere classified: Secondary | ICD-10-CM

## 2018-01-21 DIAGNOSIS — M25562 Pain in left knee: Principal | ICD-10-CM

## 2018-01-21 NOTE — Therapy (Signed)
[place], [place], [address] Phone: [phone]   Fax:  [phone]  Physical Therapy Treatment  Patient Details  Name: Hannah Baldwin MRN: [id number] Date of Birth: Aug 28, 1971 No data recorded  Encounter Date: 01/21/2018  PT End of Session - 01/21/18 1200    Visit Number  11    Number of Visits  18    Date for PT Re-Evaluation  02/14/18    PT Start Time  1150    PT Stop Time  3276    PT Time Calculation (min)  45 min    Activity Tolerance  Patient tolerated treatment well    Behavior During Therapy  [facility] for tasks assessed/performed       Past Medical History:  Diagnosis Date  . Anemia   . Asthma Dx 2014  . Diabetes mellitus without complication ([place])   . 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 Pneumonia ~ 2000 X 1    Past Surgical History:  Procedure Laterality Date  . CESAREAN SECTION  1992    There were no vitals filed for this visit.  Subjective Assessment - 01/21/18 1157    Subjective  Knee is OK, a bit better, 4/10.  Patch stayed on the whole time.     Currently in Pain?  Yes    Pain Score  4     Pain Location  Knee    Pain Orientation  Left    Pain Descriptors / Indicators  Sore    Pain Type  Chronic pain    Pain Onset  More than a month ago    Pain Frequency  Intermittent    Aggravating Factors   overactivity, walking, too much sitting or standing     Pain Relieving Factors  rest, changing positioning, meds , therapy           [facility] Adult PT Treatment/Exercise - 01/21/18 0001      Knee/Hip Exercises: Stretches   Active Hamstring Stretch  Both;2 reps      Knee/Hip Exercises: Aerobic   Nustep  6 min LE only level 5      Knee/Hip Exercises: Standing   Heel Raises  Both;1 set;10 reps    Other Standing Knee Exercises  single leg march on foam for stability and hip strength x 20       Knee/Hip Exercises: Seated   Long Scientist, clinical (histocompatibility and immunogenetics);Both     Long Arc Quad Weight  0 lbs. holding ball between knees     Sit to Sand  2 sets;10 reps;without UE support      Knee/Hip Exercises: Sidelying   Hip ABduction  Strengthening;Both;2 sets;10 reps    Clams  x 20       Ultrasound   Ultrasound Location  L medial knee     Ultrasound Goals  Pain      Iontophoresis   Type of Iontophoresis  Dexamethasone    Location  medial left knee     Dose  1 ml    Time  6 hours                  PT Long Term Goals - 01/15/18 1048      PT LONG TERM GOAL #1   Title  Pt will be I with HEP for LE strength, endurance, flexibility     Baseline  up to date on current HEP, has decreased since  her fall     Status  Partially Met      PT LONG TERM GOAL #2   Title  Pt will be able to walk normalized gait speed (< 2.62 feet per sec with improved confidence     Baseline  walks slowly, slight limp     Status  On-going      PT LONG TERM GOAL #3   Title  Pt will demo strength in hips 4+/5 or more for maximal knee support with gait, transfers.     Baseline  4/5 pain     Status  On-going      PT LONG TERM GOAL #4   Title  FOTO score will improve to <40% to show functional mobility improvement.     Status  On-going            Plan - 01/21/18 1241    Clinical Impression Statement  Pt doing well, tolerated standing hip and knee exercises with some difficulty, pain.  Asking about having another cortisone injection but it has only been 5-6 weeks.  Able to sit to stand with less pain when NOT holding ball between knees.      PT Treatment/Interventions  ADLs/Self Care Home Management;Electrical Stimulation;Functional mobility training;Taping;Therapeutic activities;Iontophoresis 62m/ml Dexamethasone;Moist Heat;Cryotherapy;Ultrasound;Gait training;Stair training;Therapeutic exercise;DME Instruction;Balance training;Patient/family education;Manual techniques;Dry needling;Passive range of motion    PT Next Visit Plan  Repeat UKoreaand ionto,  try sidelying.   progress quad strength, ROM, hip, continue Nustep .Add sidelying hip.     PT Home Exercise Plan  SLR flexion and abduction, quad set, sit to stand    Consulted and Agree with Plan of Care  Patient       Patient will benefit from skilled therapeutic intervention in order to improve the following deficits and impairments:  Pain, Impaired flexibility, Increased fascial restricitons, Decreased strength, Decreased activity tolerance, Obesity, Decreased range of motion, Difficulty walking, Hypomobility, Decreased balance, Decreased endurance, Decreased mobility  Visit Diagnosis: Chronic pain of left knee  Muscle weakness (generalized)  Stiffness of left knee, not elsewhere classified     Problem List Patient Active Problem List   Diagnosis Date Noted  . Fibroid uterus 01/09/2018  . Primary osteoarthritis of left knee 04/09/2017  . Insomnia 11/03/2015  . Onychomycosis of toenail 07/25/2015  . Diabetic neuropathy ([place] 07/25/2015  . DUB (dysfunctional uterine bleeding) 02/03/2015  . Diabetes type 2, controlled ([place] 01/25/2015  . Severe obesity (BMI >= 40) ([place] 01/25/2015  . Moderate persistent asthma 01/21/2015  . COPD (chronic obstructive pulmonary disease) ([place] 01/20/2015  . Cardiomegaly 06/09/2014  . Hypochromic microcytic anemia 06/08/2014  . Former smoker 01/01/2013    Kelty Szafran 01/21/2018, 12:45 PM  [place] [address]Phone: [phone]  Fax:  [phone] Name: TONDINE GEMMEMRN: [id number] of Birth: 401/01/1971 JRaeford Razor PT 01/21/18 12:45 PM Phone: [phone]: [phone]

## 2018-01-23 ENCOUNTER — Ambulatory Visit: Payer: Self-pay | Admitting: Physical Therapy

## 2018-01-23 DIAGNOSIS — G8929 Other chronic pain: Secondary | ICD-10-CM

## 2018-01-23 DIAGNOSIS — M6281 Muscle weakness (generalized): Secondary | ICD-10-CM

## 2018-01-23 DIAGNOSIS — M25562 Pain in left knee: Principal | ICD-10-CM

## 2018-01-23 DIAGNOSIS — M25662 Stiffness of left knee, not elsewhere classified: Secondary | ICD-10-CM

## 2018-01-23 NOTE — Therapy (Signed)
[place], [place], [address] Phone: [phone]   Fax:  [phone]  Physical Therapy Treatment  Patient Details  Name: Hannah Baldwin MRN: [id number] Date of Birth: 01-Aug-1971 No data recorded  Encounter Date: 01/23/2018  PT End of Session - 01/23/18 1147    Visit Number  12    Number of Visits  18    Date for PT Re-Evaluation  02/14/18    PT Start Time  6789    PT Stop Time  1223    PT Time Calculation (min)  38 min       Past Medical History:  Diagnosis Date  . Anemia   . Asthma Dx 2014  . Diabetes mellitus without complication ([place])   . 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 Pneumonia ~ 2000 X 1    Past Surgical History:  Procedure Laterality Date  . CESAREAN SECTION  1992    There were no vitals filed for this visit.  Subjective Assessment - 01/23/18 1146    Subjective  7/10 pain today. Not a good day. Having menstrual cramps.    Currently in Pain?  Yes    Pain Score  7     Pain Location  Knee    Pain Orientation  Left    Pain Descriptors / Indicators  Aching;Sore                       [facility] Adult PT Treatment/Exercise - 01/23/18 0001      Knee/Hip Exercises: Aerobic   Nustep  6 min LE only level 5      Knee/Hip Exercises: Seated   Long Scientist, clinical (histocompatibility and immunogenetics);Both    Long Arc Quad Weight  0 lbs. holding ball between knees       Knee/Hip Exercises: Supine   Quad Sets  10 reps    Short Arc Quad Sets  2 sets;10 reps with ball squeeze    Bridges with Ball Squeeze  2 sets;10 reps    Straight Leg Raises  Left;10 reps    Straight Leg Raise with External Rotation  10 reps      Ultrasound   Ultrasound Location  L medial knee    Ultrasound Parameters  1.2 w/cm2 1 mhz cont.    Ultrasound Goals  Pain      Iontophoresis   Type of Iontophoresis  Dexamethasone    Location  medial left knee     Dose  1 ml    Time  6 hours                   PT Long Term Goals - 01/15/18 1048      PT LONG TERM GOAL #1   Title  Pt will be I with HEP for LE strength, endurance, flexibility     Baseline  up to date on current HEP, has decreased since her fall     Status  Partially Met      PT LONG TERM GOAL #2   Title  Pt will be able to walk normalized gait speed (< 2.62 feet per sec with improved confidence     Baseline  walks slowly, slight limp     Status  On-going      PT LONG TERM GOAL #3   Title  Pt will demo strength in hips 4+/5 or more for maximal knee support with  gait, transfers.     Baseline  4/5 pain     Status  On-going      PT LONG TERM GOAL #4   Title  FOTO score will improve to <40% to show functional mobility improvement.     Status  On-going            Plan - 01/23/18 1234    Clinical Impression Statement  pt reports increased pain today, but she fine earlier at home. She is unsure why she hurts more now. Felt good after last treatment. Exercises mostly open chain per pt request to take it easy today. [place] and [place] repeated for pain. She felt better at end of session.     PT Next Visit Plan  Repeat US and ionto,  try sidelying.  progress quad strength, ROM, hip, continue Nustep .Add sidelying hip.     PT Home Exercise Plan  SLR flexion and abduction, quad set, sit to stand    Consulted and Agree with Plan of Care  Patient       Patient will benefit from skilled therapeutic intervention in order to improve the following deficits and impairments:  Pain, Impaired flexibility, Increased fascial restricitons, Decreased strength, Decreased activity tolerance, Obesity, Decreased range of motion, Difficulty walking, Hypomobility, Decreased balance, Decreased endurance, Decreased mobility  Visit Diagnosis: Chronic pain of left knee  Muscle weakness (generalized)  Stiffness of left knee, not elsewhere classified     Problem List Patient Active Problem List   Diagnosis Date Noted  .  Fibroid uterus 01/09/2018  . Primary osteoarthritis of left knee 04/09/2017  . Insomnia 11/03/2015  . Onychomycosis of toenail 07/25/2015  . Diabetic neuropathy ([place]) 07/25/2015  . DUB (dysfunctional uterine bleeding) 02/03/2015  . Diabetes type 2, controlled ([place]) 01/25/2015  . Severe obesity (BMI >= 40) ([place]) 01/25/2015  . Moderate persistent asthma 01/21/2015  . COPD (chronic obstructive pulmonary disease) ([place]) 01/20/2015  . Cardiomegaly 06/09/2014  . Hypochromic microcytic anemia 06/08/2014  . Former smoker 01/01/2013    Dorene Ar, PTA 01/23/2018, 12:36 PM  [facility] [address] [place], [place], [address] Phone: [phone]   Fax:  [phone]  Name: Hannah Baldwin MRN: [id number] Date of Birth: [birth year]

## 2018-01-27 MED FILL — ?METFORMIN HCL 500MG TABLET: 500 | 30 days supply | Qty: 60 | Fill #2

## 2018-01-28 ENCOUNTER — Encounter: Payer: Self-pay | Admitting: Physical Therapy

## 2018-01-28 ENCOUNTER — Ambulatory Visit: Payer: Self-pay | Admitting: Physical Therapy

## 2018-01-28 DIAGNOSIS — G8929 Other chronic pain: Secondary | ICD-10-CM

## 2018-01-28 DIAGNOSIS — M6281 Muscle weakness (generalized): Secondary | ICD-10-CM

## 2018-01-28 DIAGNOSIS — M25662 Stiffness of left knee, not elsewhere classified: Secondary | ICD-10-CM

## 2018-01-28 DIAGNOSIS — M25562 Pain in left knee: Principal | ICD-10-CM

## 2018-01-28 MED FILL — GABAPENTIN 100 MG CAPSULE: 100 | 30 days supply | Qty: 90 | Fill #2

## 2018-01-28 NOTE — Therapy (Addendum)
[place], [place], [address] Phone: [phone]   Fax:  [phone]  Physical Therapy Treatment Addended Discharge   Patient Details  Name: Hannah Baldwin MRN: [id number] Date of Birth: 1970/12/02 No data recorded  Encounter Date: 01/28/2018  PT End of Session - 01/28/18 1103    Visit Number  13    Number of Visits  18    Date for PT Re-Evaluation  02/14/18    PT Start Time  1100    PT Stop Time  1140    PT Time Calculation (min)  40 min       Past Medical History:  Diagnosis Date  . Anemia   . Asthma Dx 2014  . Diabetes mellitus without complication ([place])   . 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 Pneumonia ~ 2000 X 1    Past Surgical History:  Procedure Laterality Date  . CESAREAN SECTION  1992    There were no vitals filed for this visit.  Subjective Assessment - 01/28/18 1102    Subjective  Not doing exercises everyday but I am doing them some.     Currently in Pain?  No/denies                       [facility] Adult PT Treatment/Exercise - 01/28/18 0001      Knee/Hip Exercises: Aerobic   Nustep  6 min LE only level 5      Knee/Hip Exercises: Machines for Strengthening   Cybex Knee Extension  10 lbs 2 x 15 reps     Cybex Knee Flexion  25 lbs 2 x 20 reps     Cybex Leg Press  1 plate x 20 , 2 plates x 20 , single leg 1 plate x 10 , smaller ROM      Knee/Hip Exercises: Standing   Heel Raises  Both;1 set;10 reps    Knee Flexion  20 reps    Lateral Step Up  10 reps;Hand Hold: 1;Step Height: 4"    Forward Step Up  10 reps;Hand Hold: 1;Step Height: 4" pain wiht 6 inch    Functional Squat  10 reps    Other Standing Knee Exercises  hip abduction and march 4# 10 x 2                  PT Long Term Goals - 01/15/18 1048      PT LONG TERM GOAL #1   Title  Pt will be I with HEP for LE strength, endurance, flexibility     Baseline  up to  date on current HEP, has decreased since her fall     Status  Partially Met      PT LONG TERM GOAL #2   Title  Pt will be able to walk normalized gait speed (< 2.62 feet per sec with improved confidence     Baseline  walks slowly, slight limp     Status  On-going      PT LONG TERM GOAL #3   Title  Pt will demo strength in hips 4+/5 or more for maximal knee support with gait, transfers.     Baseline  4/5 pain     Status  On-going      PT LONG TERM GOAL #4   Title  FOTO score will improve to <40% to show functional mobility improvement.  Status  On-going            Plan - 01/28/18 1143    Clinical Impression Statement  Pt reports improved confidence with gait and less pain today. Able to strengthen in closed chain today. She had pain with 6 inch step so reduced to 4 inch. Repeated US and ionto as she had some medial knee pain after strengthening.     PT Next Visit Plan  Repeat US and ionto,  try sidelying.  progress quad strength, ROM, hip, continue Nustep .Add sidelying hip.     PT Home Exercise Plan  SLR flexion and abduction, quad set, sit to stand    Consulted and Agree with Plan of Care  Patient       Patient will benefit from skilled therapeutic intervention in order to improve the following deficits and impairments:  Pain, Impaired flexibility, Increased fascial restricitons, Decreased strength, Decreased activity tolerance, Obesity, Decreased range of motion, Difficulty walking, Hypomobility, Decreased balance, Decreased endurance, Decreased mobility  Visit Diagnosis: Chronic pain of left knee  Muscle weakness (generalized)  Stiffness of left knee, not elsewhere classified     Problem List Patient Active Problem List   Diagnosis Date Noted  . Fibroid uterus 01/09/2018  . Primary osteoarthritis of left knee 04/09/2017  . Insomnia 11/03/2015  . Onychomycosis of toenail 07/25/2015  . Diabetic neuropathy ([place]) 07/25/2015  . DUB (dysfunctional uterine bleeding)  02/03/2015  . Diabetes type 2, controlled ([place]) 01/25/2015  . Severe obesity (BMI >= 40) ([place]) 01/25/2015  . Moderate persistent asthma 01/21/2015  . COPD (chronic obstructive pulmonary disease) ([place]) 01/20/2015  . Cardiomegaly 06/09/2014  . Hypochromic microcytic anemia 06/08/2014  . Former smoker 01/01/2013    Dorene Ar, PTA 01/28/2018, 11:45 AM  [place], [place], [address] Phone: [phone]   Fax:  [phone]  Name: Hannah Baldwin MRN: [id number] Date of Birth: [birth year]  PHYSICAL THERAPY DISCHARGE SUMMARY  Visits from Start of Care: 13  Current functional level related to goals / functional outcomes: See above for most recent info    Remaining deficits: Pain, strength, see above for most recent    Education / Equipment: HEP, RICE  Plan: Patient agrees to discharge.  Patient goals were partially met. Patient is being discharged due to not returning since the last visit.  ?????     Raeford Razor, PT 02/20/18 2:52 PM Phone: [phone] Fax: [phone]

## 2018-02-03 ENCOUNTER — Encounter

## 2018-02-04 ENCOUNTER — Encounter: Payer: Self-pay | Admitting: Internal Medicine

## 2018-02-04 ENCOUNTER — Ambulatory Visit: Payer: Self-pay | Attending: Internal Medicine | Admitting: Internal Medicine

## 2018-02-04 ENCOUNTER — Ambulatory Visit: Payer: Self-pay | Admitting: Physical Therapy

## 2018-02-04 VITALS — BP 136/87 | HR 80 | Temp 97.6°F | Resp 16 | Wt 321.4 lb

## 2018-02-04 DIAGNOSIS — D259 Leiomyoma of uterus, unspecified: Secondary | ICD-10-CM | POA: Insufficient documentation

## 2018-02-04 DIAGNOSIS — F1120 Opioid dependence, uncomplicated: Secondary | ICD-10-CM | POA: Insufficient documentation

## 2018-02-04 DIAGNOSIS — Z1231 Encounter for screening mammogram for malignant neoplasm of breast: Secondary | ICD-10-CM

## 2018-02-04 DIAGNOSIS — Z87891 Personal history of nicotine dependence: Secondary | ICD-10-CM | POA: Insufficient documentation

## 2018-02-04 DIAGNOSIS — N938 Other specified abnormal uterine and vaginal bleeding: Secondary | ICD-10-CM | POA: Insufficient documentation

## 2018-02-04 DIAGNOSIS — Z79899 Other long term (current) drug therapy: Secondary | ICD-10-CM | POA: Insufficient documentation

## 2018-02-04 DIAGNOSIS — M1712 Unilateral primary osteoarthritis, left knee: Secondary | ICD-10-CM | POA: Insufficient documentation

## 2018-02-04 DIAGNOSIS — Z7984 Long term (current) use of oral hypoglycemic drugs: Secondary | ICD-10-CM | POA: Insufficient documentation

## 2018-02-04 DIAGNOSIS — Z1239 Encounter for other screening for malignant neoplasm of breast: Secondary | ICD-10-CM

## 2018-02-04 DIAGNOSIS — E1142 Type 2 diabetes mellitus with diabetic polyneuropathy: Secondary | ICD-10-CM | POA: Insufficient documentation

## 2018-02-04 DIAGNOSIS — J4540 Moderate persistent asthma, uncomplicated: Secondary | ICD-10-CM | POA: Insufficient documentation

## 2018-02-04 DIAGNOSIS — D509 Iron deficiency anemia, unspecified: Secondary | ICD-10-CM | POA: Insufficient documentation

## 2018-02-04 LAB — GLUCOSE, POCT (MANUAL RESULT ENTRY): POC Glucose: 99 mg/dl (ref 70–99)

## 2018-02-04 NOTE — Progress Notes (Signed)
Patient ID: Hannah Baldwin, female    DOB: November 23, 1970  MRN: [id number]  CC: Follow-up   Subjective: Hannah Baldwin is a 47 y.o. female who presents for chronic disease management. Her concerns today include: Patient with history of diabetes type 2 with neuropathy, obesity, mod persistent asthma, osteoarthritis of the left knee,iron deficiency anemia,DUB with fibroid, opioidaddictionon Methadone.  1.  Hannah Baldwin has cleared with oral antifungal agent..  2.  DUB:  Saw GYN Dr. Roselie Awkward 01/09/2018.  Megace inc to BID and repeat pelvic US ordered.  Has f/u appt.  No bleeding in about 4-6 mths -stop taking iron x 1-2 mth because energy level is better.  Last Hb was around 10  3.  OA knee:  Saw ortho.  Had inj to L knee 11/27/2017.  She is now having P.T on the knee.  She has found the therapy and the injection to be very helpful  4.  DM: checks BS a few x a wk -sodas are her weakness; she drinks 3 cans a day -loss 15 lbs since 05/2017.  Plans to be more active now that her knee is feeling better  Patient Active Problem List   Diagnosis Date Noted  . Fibroid uterus 01/09/2018  . Primary osteoarthritis of left knee 04/09/2017  . Insomnia 11/03/2015  . Onychomycosis of toenail 07/25/2015  . Diabetic neuropathy ([place]) 07/25/2015  . DUB (dysfunctional uterine bleeding) 02/03/2015  . Diabetes type 2, controlled ([place]) 01/25/2015  . Severe obesity (BMI >= 40) ([place]) 01/25/2015  . Moderate persistent asthma 01/21/2015  . COPD (chronic obstructive pulmonary disease) ([place]) 01/20/2015  . Cardiomegaly 06/09/2014  . Hypochromic microcytic anemia 06/08/2014  . Former smoker 01/01/2013     Current Outpatient Medications on File Prior to Visit  Medication Sig Dispense Refill  . acetaminophen (TYLENOL) 325 MG tablet Take 2 tablets (650 mg total) by mouth every 8 (eight) hours. 60 tablet 4  . albuterol (PROVENTIL HFA;VENTOLIN HFA) 108 (90 Base) MCG/ACT inhaler Inhale 2 puffs into the lungs every 6 (six)  hours as needed for wheezing or shortness of breath. 3 Inhaler 5  . albuterol (PROVENTIL) (2.5 MG/3ML) 0.083% nebulizer solution Take 2.5 mg by nebulization every 6 (six) hours as needed for wheezing or shortness of breath.    . blood glucose meter kit and supplies KIT Dispense based on patient and insurance preference. Use up to four times daily as directed. (FOR ICD-9 250.00, 250.01). 1 each 0  . Blood Glucose Monitoring Suppl (TRUE METRIX METER) w/Device KIT USE AS INSTRUCTED 1 kit 0  . DULoxetine (CYMBALTA) 60 MG capsule Take 1 capsule (60 mg total) by mouth daily. 30 capsule 6  . ferrous sulfate 325 (65 FE) MG tablet Take 1 tablet (325 mg total) by mouth 2 (two) times daily with a meal. 120 tablet 2  . furosemide (LASIX) 20 MG tablet Take 1 tablet (20 mg total) by mouth daily. For swelling in legs (Patient not taking: Reported on 01/09/2018) 30 tablet 3  . gabapentin (NEURONTIN) 100 MG capsule Take 1-3 capsules (100-300 mg total) by mouth at bedtime. 90 capsule 2  . glucose blood (TRUE METRIX BLOOD GLUCOSE TEST) test strip Use as instructed 100 each 12  . megestrol (MEGACE) 40 MG tablet Take 1 tablet (40 mg total) by mouth 2 (two) times daily. 120 tablet 1  . meloxicam (MOBIC) 15 MG tablet TAKE 1 TABLET BY MOUTH DAILY. 30 tablet 2  . metFORMIN (GLUCOPHAGE) 500 MG tablet TAKE 1 TABLET BY MOUTH  2 TIMES DAILY. 60 tablet 2  . methadone (METHADOSE) 40 MG disintegrating tablet Take 45 mg by mouth daily.    . mometasone-formoterol (DULERA) 100-5 MCG/ACT AERO Inhale 2 puffs into the lungs 2 (two) times daily. 13 g 6  . nystatin cream (MYCOSTATIN) Apply to affected area BID 30 g 0  . predniSONE (DELTASONE) 10 MG tablet 6,5,4,3,2,1 take each days dose all at once in the morning with food 21 tablet 0  . traZODone (DESYREL) 50 MG tablet Take 0.5-1 tablets (25-50 mg total) by mouth at bedtime as needed for sleep. (Patient not taking: Reported on 01/09/2018) 30 tablet 0  . TRUEPLUS LANCETS 28G MISC Check blood  sugar TID & QHS 100 each 2   Current Facility-Administered Medications on File Prior to Visit  Medication Dose Route Frequency Provider Last Rate Last Dose  . albuterol (PROVENTIL) (2.5 MG/3ML) 0.083% nebulizer solution 2.5 mg  2.5 mg Nebulization Once McClung, Angela M, PA-C        No Known Allergies  Social History   Socioeconomic History  . Marital status: Widowed    Spouse name: Not on file  . Number of children: Not on file  . Years of education: Not on file  . Highest education level: Not on file  Occupational History  . Not on file  Social Needs  . Financial resource strain: Not on file  . Food insecurity:    Worry: Not on file    Inability: Not on file  . Transportation needs:    Medical: Not on file    Non-medical: Not on file  Tobacco Use  . Smoking status: Former Smoker    Packs/day: 0.25    Years: 20.00    Pack years: 5.00    Types: Cigarettes    Last attempt to quit: 01/18/2015    Years since quitting: 3.0  . Smokeless tobacco: Never Used  Substance and Sexual Activity  . Alcohol use: Yes    Comment: 01/20/2015 "dank some alcohol on my birthday, cookouts,  etc,;  maybe once/month"  . Drug use: No  . Sexual activity: Not Currently    Birth control/protection: None  Lifestyle  . Physical activity:    Days per week: Not on file    Minutes per session: Not on file  . Stress: Not on file  Relationships  . Social connections:    Talks on phone: Not on file    Gets together: Not on file    Attends religious service: Not on file    Active member of club or organization: Not on file    Attends meetings of clubs or organizations: Not on file    Relationship status: Not on file  . Intimate partner violence:    Fear of current or ex partner: Not on file    Emotionally abused: Not on file    Physically abused: Not on file    Forced sexual activity: Not on file  Other Topics Concern  . Not on file  Social History Narrative  . Not on file    Family  History  Problem Relation Age of Onset  . Asthma Mother   . Asthma Daughter     Past Surgical History:  Procedure Laterality Date  . CESAREAN SECTION  1992    ROS: Review of Systems Negative except as stated above PHYSICAL EXAM: BP 136/87   Pulse 80   Temp 97.6 F (36.4 C) (Oral)   Resp 16   Wt (!) 321 lb 6.4 oz (  145.8 kg)   LMP  (LMP Unknown)   SpO2 96%   BMI 56.04 kg/m   Wt Readings from Last 3 Encounters:  02/04/18 (!) 321 lb 6.4 oz (145.8 kg)  01/09/18 (!) 323 lb 9.6 oz (146.8 kg)  01/06/18 (!) 325 lb (147.4 kg)    Physical Exam  General appearance - alert, well appearing, obese AAF and in no distress Mental status - alert, oriented to person, place, and time, normal mood, behavior, speech, dress, motor activity, and thought processes Eyes:  Pale conjunctiva Mouth - mucous membranes moist, pharynx normal without lesions Neck - supple, no significant adenopathy Chest - clear to auscultation, no wheezes, rales or rhonchi, symmetric air entry Extremities - peripheral pulses normal, no pedal edema, no clubbing or cyanosis  BS 99 Lab Results  Component Value Date   HGBA1C 5.6 01/01/2018     Chemistry      Component Value Date/Time   NA 142 11/18/2017 1144   K 4.8 11/18/2017 1144   CL 103 11/18/2017 1144   CO2 26 11/18/2017 1144   BUN 7 11/18/2017 1144   CREATININE 0.87 11/18/2017 1144   CREATININE 0.85 12/20/2016 1048      Component Value Date/Time   CALCIUM 9.2 11/18/2017 1144   ALKPHOS 95 11/18/2017 1144   AST 10 11/18/2017 1144   ALT 12 11/18/2017 1144   BILITOT 0.2 11/18/2017 1144     Lab Results  Component Value Date   WBC 6.5 11/18/2017   HGB 10.7 (L) 11/18/2017   HCT 34.3 11/18/2017   MCV 79 11/18/2017   PLT 462 (H) 11/18/2017      ASSESSMENT AND PLAN: 1. Controlled type 2 diabetes mellitus with diabetic polyneuropathy, without long-term current use of insulin (HCC) Continue metformin.  Encouraged her to cut back on drinking sodas.   Encouraged her to get in some form of aerobic exercise at least 3 days a week for 20 minutes or as tolerated - POCT glucose (manual entry) - Microalbumin / creatinine urine ratio  2. Morbid obesity ([place]) See #1 above. Commended her on weight loss so far. 3. Primary osteoarthritis of left knee -Continue Cymbalta and meloxicam.  Continue to encourage weight loss. Currently in physical therapy which she has found helpful. 4. Uterine leiomyoma, unspecified location Has follow-up with GYN  5. Microcytic anemia Encouraged her to continue taking the iron.  She still has pale conjunctiva indicating that she is probably still anemic.  6. Breast cancer screening - MM Digital Screening; Future  Patient was given the opportunity to ask questions.  Patient verbalized understanding of the plan and was able to repeat key elements of the plan.   Orders Placed This Encounter  Procedures  . MM Digital Screening  . Microalbumin / creatinine urine ratio  . POCT glucose (manual entry)     Requested Prescriptions    No prescriptions requested or ordered in this encounter    Return in about 3 months (around 05/06/2018).  Karle Plumber, MD, FACP

## 2018-02-04 NOTE — Patient Instructions (Signed)
Now that your knees are feeling better, try to walk 2-3 x a week for 20 minutes.   Try to cut back on drinking regular sodas.

## 2018-02-05 LAB — MICROALBUMIN / CREATININE URINE RATIO
Creatinine, Urine: 244.5 mg/dL
MICROALB/CREAT RATIO: 2.2 mg/g{creat} (ref 0.0–30.0)
MICROALBUM., U, RANDOM: 5.3 ug/mL

## 2018-02-06 ENCOUNTER — Ambulatory Visit: Payer: Self-pay | Attending: Orthopaedic Surgery | Admitting: Physical Therapy

## 2018-02-06 ENCOUNTER — Telehealth: Payer: Self-pay | Admitting: Physical Therapy

## 2018-02-06 NOTE — Telephone Encounter (Signed)
Left message regarding no show to appointment today. Left next appointment time and asked her to call if she needs to cancel or reschedule.  

## 2018-02-07 ENCOUNTER — Ambulatory Visit (INDEPENDENT_AMBULATORY_CARE_PROVIDER_SITE_OTHER): Payer: Self-pay | Admitting: Obstetrics & Gynecology

## 2018-02-07 ENCOUNTER — Encounter: Payer: Self-pay | Admitting: Obstetrics & Gynecology

## 2018-02-07 VITALS — BP 129/80 | HR 71 | Ht 63.0 in | Wt 317.4 lb

## 2018-02-07 DIAGNOSIS — N938 Other specified abnormal uterine and vaginal bleeding: Secondary | ICD-10-CM

## 2018-02-07 DIAGNOSIS — D259 Leiomyoma of uterus, unspecified: Secondary | ICD-10-CM

## 2018-02-07 LAB — POCT PREGNANCY, URINE: Preg Test, Ur: NEGATIVE

## 2018-02-07 NOTE — Patient Instructions (Signed)
Perimenopause Perimenopause is the time when your body begins to move into the menopause (no menstrual period for 12 straight months). It is a natural process. Perimenopause can begin 2-8 years before the menopause and usually lasts for 1 year after the menopause. During this time, your ovaries may or may not produce an egg. The ovaries vary in their production of estrogen and progesterone hormones each month. This can cause irregular menstrual periods, difficulty getting pregnant, vaginal bleeding between periods, and uncomfortable symptoms. What are the causes?  Irregular production of the ovarian hormones, estrogen and progesterone, and not ovulating every month. Other causes include:  Tumor of the pituitary gland in the brain.  Medical disease that affects the ovaries.  Radiation treatment.  Chemotherapy.  Unknown causes.  Heavy smoking and excessive alcohol intake can bring on perimenopause sooner. What are the signs or symptoms?  Hot flashes.  Night sweats.  Irregular menstrual periods.  Decreased sex drive.  Vaginal dryness.  Headaches.  Mood swings.  Depression.  Memory problems.  Irritability.  Tiredness.  Weight gain.  Trouble getting pregnant.  The beginning of losing bone cells (osteoporosis).  The beginning of hardening of the arteries (atherosclerosis). How is this diagnosed? Your health care provider will make a diagnosis by analyzing your age, menstrual history, and symptoms. He or she will do a physical exam and note any changes in your body, especially your female organs. Female hormone tests may or may not be helpful depending on the amount of female hormones you produce and when you produce them. However, other hormone tests may be helpful to rule out other problems. How is this treated? In some cases, no treatment is needed. The decision on whether treatment is necessary during the perimenopause should be made by you and your health care  provider based on how the symptoms are affecting you and your lifestyle. Various treatments are available, such as:  Treating individual symptoms with a specific medicine for that symptom.  Herbal medicines that can help specific symptoms.  Counseling.  Group therapy. Follow these instructions at home:  Keep track of your menstrual periods (when they occur, how heavy they are, how long between periods, and how long they last) as well as your symptoms and when they started.  Only take over-the-counter or prescription medicines as directed by your health care provider.  Sleep and rest.  Exercise.  Eat a diet that contains calcium (good for your bones) and soy (acts like the estrogen hormone).  Do not smoke.  Avoid alcoholic beverages.  Take vitamin supplements as recommended by your health care provider. Taking vitamin E may help in certain cases.  Take calcium and vitamin D supplements to help prevent bone loss.  Group therapy is sometimes helpful.  Acupuncture may help in some cases. Contact a health care provider if:  You have questions about any symptoms you are having.  You need a referral to a specialist (gynecologist, psychiatrist, or psychologist). Get help right away if:  You have vaginal bleeding.  Your period lasts longer than 8 days.  Your periods are recurring sooner than 21 days.  You have bleeding after intercourse.  You have severe depression.  You have pain when you urinate.  You have severe headaches.  You have vision problems. This information is not intended to replace advice given to you by your health care provider. Make sure you discuss any questions you have with your health care provider. Document Released: 11/01/2004 Document Revised: 03/01/2016 Document Reviewed: 04/23/2013 Elsevier Interactive   Patient Education  2017 [employer] Inc[employer]  

## 2018-02-07 NOTE — Progress Notes (Signed)
Subjective:     Patient ID: Hannah Baldwin, female   DOB: Sep 03, 1971, 47 y.o.   MRN: [id number] Cc: had f/u US [id number] No LMP recorded (lmp unknown). (Menstrual status: Irregular Periods). No vaginal bleeding currently on present dose of Megace and she is satisfied with this. Hannah Baldwin Past Surgical History:  Procedure Laterality Date  . CESAREAN SECTION  1992  No Known Allergies Current Outpatient Medications on File Prior to Visit  Medication Sig Dispense Refill  . acetaminophen (TYLENOL) 325 MG tablet Take 2 tablets (650 mg total) by mouth every 8 (eight) hours. 60 tablet 4  . albuterol (PROVENTIL HFA;VENTOLIN HFA) 108 (90 Base) MCG/ACT inhaler Inhale 2 puffs into the lungs every 6 (six) hours as needed for wheezing or shortness of breath. 3 Inhaler 5  . albuterol (PROVENTIL) (2.5 MG/3ML) 0.083% nebulizer solution Take 2.5 mg by nebulization every 6 (six) hours as needed for wheezing or shortness of breath.    . blood glucose meter kit and supplies KIT Dispense based on patient and insurance preference. Use up to four times daily as directed. (FOR ICD-9 250.00, 250.01). 1 each 0  . Blood Glucose Monitoring Suppl (TRUE METRIX METER) w/Device KIT USE AS INSTRUCTED 1 kit 0  . DULoxetine (CYMBALTA) 60 MG capsule Take 1 capsule (60 mg total) by mouth daily. 30 capsule 6  . ferrous sulfate 325 (65 FE) MG tablet Take 1 tablet (325 mg total) by mouth 2 (two) times daily with a meal. 120 tablet 2  . gabapentin (NEURONTIN) 100 MG capsule Take 1-3 capsules (100-300 mg total) by mouth at bedtime. 90 capsule 2  . glucose blood (TRUE METRIX BLOOD GLUCOSE TEST) test strip Use as instructed 100 each 12  . megestrol (MEGACE) 40 MG tablet Take 1 tablet (40 mg total) by mouth 2 (two) times daily. 120 tablet 1  . meloxicam (MOBIC) 15 MG tablet TAKE 1 TABLET BY MOUTH DAILY. 30 tablet 2  . metFORMIN (GLUCOPHAGE) 500 MG tablet TAKE 1 TABLET BY MOUTH 2 TIMES DAILY. 60 tablet 2  . mometasone-formoterol (DULERA)  100-5 MCG/ACT AERO Inhale 2 puffs into the lungs 2 (two) times daily. 13 g 6  . nystatin cream (MYCOSTATIN) Apply to affected area BID 30 g 0  . predniSONE (DELTASONE) 10 MG tablet 6,5,4,3,2,1 take each days dose all at once in the morning with food 21 tablet 0  . TRUEPLUS LANCETS 28G MISC Check blood sugar TID & QHS 100 each 2  . furosemide (LASIX) 20 MG tablet Take 1 tablet (20 mg total) by mouth daily. For swelling in legs (Patient not taking: Reported on 01/09/2018) 30 tablet 3  . methadone (METHADOSE) 40 MG disintegrating tablet Take 45 mg by mouth daily.    . traZODone (DESYREL) 50 MG tablet Take 0.5-1 tablets (25-50 mg total) by mouth at bedtime as needed for sleep. (Patient not taking: Reported on 01/09/2018) 30 tablet 0   Current Facility-Administered Medications on File Prior to Visit  Medication Dose Route Frequency Provider Last Rate Last Dose  . albuterol (PROVENTIL) (2.5 MG/3ML) 0.083% nebulizer solution 2.5 mg  2.5 mg Nebulization Once [place] Donovan, PA-C          Review of Systems  Constitutional: Negative.   Endocrine: Negative.   Genitourinary: Negative.        Objective:   Physical Exam  Constitutional: She appears well-developed.  Cardiovascular: Normal rate.  Pulmonary/Chest: No respiratory distress.  Psychiatric: She has a normal mood and affect.  Assessment:     CLINICAL DATA:  Dysfunctional uterine bleeding, uterine leiomyoma, irregular cycles  EXAM: TRANSABDOMINAL AND TRANSVAGINAL ULTRASOUND OF PELVIS  TECHNIQUE: Both transabdominal and transvaginal ultrasound examinations of the pelvis were performed. Transabdominal technique was performed for global imaging of the pelvis including uterus, ovaries, adnexal regions, and pelvic cul-de-sac. It was necessary to proceed with endovaginal exam following the transabdominal exam to visualize the endometrium.  COMPARISON:  None  FINDINGS: Uterus  Measurements: 11.9 x 6.3 x 8.2 cm. Tiny  subserosal leiomyoma at the upper uterus, 7 x 5 x 9 mm. Additional intramural leiomyoma LEFT upper uterus 2.7 x 2.3 x 1.8 cm. No additional uterine masses.  Endometrium  Thickness: 7 mm thick, normal. No endometrial fluid or focal abnormality  Right ovary  Measurements: 2.9 x 1.9 x 2.2 cm.  Normal morphology without mass  Left ovary  Measurements: 2.6 x 2.1 x 2.5 cm.  Normal morphology without mass  Other findings  No free pelvic fluid or adnexal masses.  IMPRESSION: 2 small uterine leiomyomata.  Otherwise negative exam.   Electronically Signed   By: Lavonia Dana M.D.   On: 01/13/2018 14:48   US findings are stable and bleeding associated with perimenopause is controlled with progestin Plan:     May continue Megace but return if she wants to change to another medication or if problems with bleeding recur.   Woodroe Mode, MD 02/07/2018

## 2018-02-07 NOTE — Progress Notes (Signed)
Pt states is not mentally prepared today for the Biopsy.States was only to come fopr [place] results & Pap.

## 2018-02-10 ENCOUNTER — Ambulatory Visit: Payer: Self-pay | Admitting: Physical Therapy

## 2018-02-10 ENCOUNTER — Telehealth: Payer: Self-pay | Admitting: Physical Therapy

## 2018-02-10 NOTE — Telephone Encounter (Signed)
Left message regarding second consecutive no-show for appointment. I asked her to call if she wanted to reschedule however her next appointments were canceled due to our attendance policy.

## 2018-02-12 ENCOUNTER — Ambulatory Visit: Payer: Self-pay | Admitting: Physical Therapy

## 2018-02-13 ENCOUNTER — Other Ambulatory Visit: Payer: Self-pay | Admitting: Internal Medicine

## 2018-02-13 DIAGNOSIS — M1712 Unilateral primary osteoarthritis, left knee: Secondary | ICD-10-CM

## 2018-02-13 MED FILL — ?DULOXETINE HCL 30 MG CPEP: 30 | 30 days supply | Qty: 30 | Fill #0

## 2018-02-18 ENCOUNTER — Other Ambulatory Visit: Payer: Self-pay | Admitting: Internal Medicine

## 2018-02-18 DIAGNOSIS — G4700 Insomnia, unspecified: Secondary | ICD-10-CM

## 2018-02-18 MED ORDER — TRAZODONE HCL 50 MG PO TABS: 25.0000 mg | ORAL_TABLET | Freq: Every evening | ORAL | 3 refills | Status: DC | PRN

## 2018-02-18 MED FILL — traZODone HCL 50 MG TABS: 50 | 30 days supply | Qty: 30 | Fill #0

## 2018-03-12 MED FILL — traZODone HCL 50 MG TABS: 50 | 30 days supply | Qty: 30 | Fill #1

## 2018-03-26 ENCOUNTER — Ambulatory Visit: Payer: Self-pay | Attending: Internal Medicine | Admitting: Physician Assistant

## 2018-03-26 VITALS — BP 137/70 | HR 87 | Temp 99.4°F | Resp 18 | Ht 64.0 in | Wt 338.0 lb

## 2018-03-26 DIAGNOSIS — E1142 Type 2 diabetes mellitus with diabetic polyneuropathy: Secondary | ICD-10-CM

## 2018-03-26 DIAGNOSIS — Z79899 Other long term (current) drug therapy: Secondary | ICD-10-CM | POA: Insufficient documentation

## 2018-03-26 DIAGNOSIS — J45909 Unspecified asthma, uncomplicated: Secondary | ICD-10-CM | POA: Insufficient documentation

## 2018-03-26 DIAGNOSIS — R7611 Nonspecific reaction to tuberculin skin test without active tuberculosis: Secondary | ICD-10-CM

## 2018-03-26 DIAGNOSIS — Z111 Encounter for screening for respiratory tuberculosis: Secondary | ICD-10-CM

## 2018-03-26 DIAGNOSIS — Z6841 Body Mass Index (BMI) 40.0 and over, adult: Secondary | ICD-10-CM | POA: Insufficient documentation

## 2018-03-26 DIAGNOSIS — M1712 Unilateral primary osteoarthritis, left knee: Secondary | ICD-10-CM

## 2018-03-26 DIAGNOSIS — E669 Obesity, unspecified: Secondary | ICD-10-CM | POA: Insufficient documentation

## 2018-03-26 DIAGNOSIS — Z7984 Long term (current) use of oral hypoglycemic drugs: Secondary | ICD-10-CM | POA: Insufficient documentation

## 2018-03-26 LAB — GLUCOSE, POCT (MANUAL RESULT ENTRY): POC GLUCOSE: 106 mg/dL — AB (ref 70–99)

## 2018-03-26 MED ORDER — TRAMADOL HCL 50 MG PO TABS: 50.0000 mg | ORAL_TABLET | Freq: Two times a day (BID) | ORAL | 0 refills | Status: DC

## 2018-03-26 MED ORDER — NAPROXEN 500 MG PO TABS: 500.0000 mg | ORAL_TABLET | Freq: Two times a day (BID) | ORAL | 1 refills | Status: DC

## 2018-03-26 MED FILL — NAPROXEN 500 MG TABLET: 500 | 30 days supply | Qty: 60 | Fill #0

## 2018-03-26 MED FILL — traMADol HCL 50 MG TABS: 50 | 15 days supply | Qty: 30 | Fill #0

## 2018-03-26 NOTE — Progress Notes (Signed)
Patient ID: Hannah Baldwin, female   DOB: 14-Sep-1971, 47 y.o.   MRN: [id number]   Hannah Baldwin, is a 47 y.o. female  TIR:[id number]  QPY:[id number]  DOB - 1971-05-10  Subjective:  Chief Complaint and HPI: Hannah Baldwin is a 47 y.o. female here today for increase in L knee pain.  She was previously on methadone, but according to the chart, her last Rx was 01/2018.  She was paying out of pocket to go to the pain clinic and could no longer afford it.  She has noticed since being off of it that she is having a lot more L knee pain.  meloxicam not helping.  Tramadol has helped in the past.    ROS:   Constitutional:  No f/c, No night sweats, No unexplained weight loss. EENT:  No vision changes, No blurry vision, No hearing changes. No mouth, throat, or ear problems.  Respiratory: No cough, No SOB Cardiac: No CP, no palpitations GI:  No abd pain, No N/V/D. GU: No Urinary s/sx Musculoskeletal: + L knee pain Neuro: No headache, no dizziness, no motor weakness.  Skin: No rash Endocrine:  No polydipsia. No polyuria.  Psych: Denies SI/HI  No problems updated.  ALLERGIES: No Known Allergies  PAST MEDICAL HISTORY: Past Medical History:  Diagnosis Date  . Anemia   . Asthma Dx 2014  . Diabetes mellitus without complication ([place])   . 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 Pneumonia ~ 2000 X 1    MEDICATIONS AT HOME: Prior to Admission medications   Medication Sig Start Date End Date Taking? Authorizing Provider  acetaminophen (TYLENOL) 325 MG tablet Take 2 tablets (650 mg total) by mouth every 8 (eight) hours. 04/09/17  Yes Ladell Pier, MD  albuterol (PROVENTIL HFA;VENTOLIN HFA) 108 (90 Base) MCG/ACT inhaler Inhale 2 puffs into the lungs every 6 (six) hours as needed for wheezing or shortness of breath. 04/09/17  Yes Ladell Pier, MD  albuterol (PROVENTIL) (2.5 MG/3ML) 0.083% nebulizer solution Take 2.5 mg by nebulization every 6 (six) hours as needed for  wheezing or shortness of breath.   Yes [provider]  blood glucose meter kit and supplies KIT Dispense based on patient and insurance preference. Use up to four times daily as directed. (FOR ICD-9 250.00, 250.01). 01/25/15  Yes Charlott Rakes, MD  Blood Glucose Monitoring Suppl (TRUE METRIX METER) w/Device KIT USE AS INSTRUCTED 09/24/17  Yes Ladell Pier, MD  DULoxetine (CYMBALTA) 30 MG capsule Take 1 capsule (30 mg total) by mouth daily. 02/13/18  Yes Ladell Pier, MD  DULoxetine (CYMBALTA) 60 MG capsule Take 1 capsule (60 mg total) by mouth daily. 09/24/17  Yes Ladell Pier, MD  ferrous sulfate 325 (65 FE) MG tablet Take 1 tablet (325 mg total) by mouth 2 (two) times daily with a meal. 11/18/17  Yes Ladell Pier, MD  gabapentin (NEURONTIN) 100 MG capsule Take 1-3 capsules (100-300 mg total) by mouth at bedtime. 09/24/17  Yes Ladell Pier, MD  glucose blood (TRUE METRIX BLOOD GLUCOSE TEST) test strip Use as instructed 09/24/17  Yes Ladell Pier, MD  megestrol (MEGACE) 40 MG tablet Take 1 tablet (40 mg total) by mouth 2 (two) times daily. 01/09/18  Yes Woodroe Mode, MD  metFORMIN (GLUCOPHAGE) 500 MG tablet TAKE 1 TABLET BY MOUTH 2 TIMES DAILY. 08/28/17  Yes Ladell Pier, MD  mometasone-formoterol (DULERA) 100-5 MCG/ACT AERO Inhale 2  puffs into the lungs 2 (two) times daily. 11/15/17  Yes Ladell Pier, MD  nystatin cream (MYCOSTATIN) Apply to affected area BID 11/18/17  Yes Ladell Pier, MD  traZODone (DESYREL) 50 MG tablet Take 0.5-1 tablets (25-50 mg total) by mouth at bedtime as needed for sleep. 02/18/18  Yes Ladell Pier, MD  TRUEPLUS LANCETS 28G MISC Check blood sugar TID & QHS 09/24/17  Yes Ladell Pier, MD  furosemide (LASIX) 20 MG tablet Take 1 tablet (20 mg total) by mouth daily. For swelling in legs Patient not taking: Reported on 01/09/2018 04/09/17   Ladell Pier, MD  naproxen (NAPROSYN) 500 MG tablet Take 1 tablet  (500 mg total) by mouth 2 (two) times daily with a meal. With food for pain 03/26/18   Argentina Donovan, PA-C  traMADol (ULTRAM) 50 MG tablet Take 1 tablet (50 mg total) by mouth 2 (two) times daily. Prn pain 03/26/18   Argentina Donovan, PA-C     Objective:  EXAM:   Vitals:   03/26/18 1103  BP: 137/70  Pulse: 87  Resp: 18  Temp: 99.4 F (37.4 C)  TempSrc: Oral  SpO2: 98%  Weight: (!) 338 lb (153.3 kg)  Height: '5\' 4"'  (1.626 m)    General appearance : A&OX3. NAD. Non-toxic-appearing;  Morbidly obese HEENT: Atraumatic and Normocephalic.  PERRLA. EOM intact.  Neck: supple, no JVD. No cervical lymphadenopathy. No thyromegaly Chest/Lungs:  Breathing-non-labored, Good air entry bilaterally, breath sounds normal without rales, rhonchi, or wheezing  CVS: S1 S2 regular, no murmurs, gallops, rubs  Extremities: Bilateral Lower Ext shows no edema, both legs are warm to touch with = pulse throughout.  L knee exam limited by obesity/bosy habitus.  No erythema or ballotment.  Full extension and 80% extension.  Ligaments stable.  There is a fair amount of crepitus in the joint  Neurology:  CN II-XII grossly intact, Non focal.   Psych:  TP linear. J/I WNL. Normal speech. Appropriate eye contact and affect.  Skin:  No Rash  Data Review Lab Results  Component Value Date   HGBA1C 5.6 01/01/2018   HGBA1C 6.0 (H) 04/09/2017   HGBA1C 6.3 12/20/2016     Assessment & Plan   1. Primary osteoarthritis of left knee worsened by obesity - traMADol (ULTRAM) 50 MG tablet; Take 1 tablet (50 mg total) by mouth 2 (two) times daily. Prn pain  Dispense: 30 tablet; Refill: 0 - naproxen (NAPROSYN) 500 MG tablet; Take 1 tablet (500 mg total) by mouth 2 (two) times daily with a meal. With food for pain  Dispense: 60 tablet; Refill: 1 -was previously managed on methadone and likely why pain has increased s/p stopping methadone.  She will need to develop a plan with her PCP for chronic pain management as today is  just to bridge the pain until then.    2. Controlled type 2 diabetes mellitus with diabetic polyneuropathy, without long-term current use of insulin (HCC) Adequate control today - Glucose (CBG)   Patient have been counseled extensively about nutrition and exercise  Return in about 2 weeks (around 04/09/2018) for Dr Waldon Reining contract with tramadol/chronic L knee pain.  The patient was given clear instructions to go to ER or return to medical center if symptoms don't improve, worsen or new problems develop. The patient verbalized understanding. The patient was told to call to get lab results if they haven't heard anything in the next week.     Freeman Caldron, PA-C [place]  [facility] and [facility], [place]   03/26/2018, 11:13 AM

## 2018-03-28 ENCOUNTER — Other Ambulatory Visit: Payer: Self-pay | Admitting: *Deleted

## 2018-03-28 DIAGNOSIS — R7611 Nonspecific reaction to tuberculin skin test without active tuberculosis: Secondary | ICD-10-CM

## 2018-03-28 DIAGNOSIS — Z111 Encounter for screening for respiratory tuberculosis: Secondary | ICD-10-CM

## 2018-03-28 LAB — TB SKIN TEST: TB SKIN TEST: POSITIVE

## 2018-03-28 NOTE — Addendum Note (Signed)
Addended by: Octaviano Glow on: 03/28/2018 12:42 PM   Modules accepted: Orders

## 2018-03-28 NOTE — Addendum Note (Signed)
Addended by: Octaviano Glow on: 03/28/2018 12:45 PM   Modules accepted: Orders

## 2018-04-03 ENCOUNTER — Telehealth: Payer: Self-pay

## 2018-04-03 LAB — QUANTIFERON-TB GOLD PLUS
QUANTIFERON NIL VALUE: 0.15 [IU]/mL
QuantiFERON Mitogen Value: >10 IU/mL
QuantiFERON TB1 Ag Value: 0.26 IU/mL
QuantiFERON TB2 Ag Value: 0.16 IU/mL
QuantiFERON-TB Gold Plus: NEGATIVE

## 2018-04-03 NOTE — Telephone Encounter (Signed)
-----   Message from Gildardo Pounds, NP sent at 04/03/2018  9:38 AM EDT ----- TB test via blood serum is negative.

## 2018-04-03 NOTE — Telephone Encounter (Signed)
CMA spoke to patient to inform on lab results.  Pt. Understood.  Pt. Request a letter for her work of the lab results.  CMA inform patient the letter is ready for her to pick up.  Pt. Understood.

## 2018-04-07 ENCOUNTER — Other Ambulatory Visit: Payer: Self-pay | Admitting: Internal Medicine

## 2018-04-07 DIAGNOSIS — E1142 Type 2 diabetes mellitus with diabetic polyneuropathy: Secondary | ICD-10-CM

## 2018-04-07 MED FILL — traZODone HCL 50 MG TABS: 50 | 30 days supply | Qty: 30 | Fill #2

## 2018-04-07 MED FILL — ?METFORMIN HCL 500MG TABS: 500 | 30 days supply | Qty: 60 | Fill #0

## 2018-04-21 ENCOUNTER — Ambulatory Visit: Payer: Self-pay

## 2018-04-28 MED FILL — MEGESTROL 40 MG TABLET: 40 | 30 days supply | Qty: 60 | Fill #0

## 2018-05-05 ENCOUNTER — Ambulatory Visit: Payer: Self-pay

## 2018-05-06 ENCOUNTER — Ambulatory Visit: Payer: Self-pay | Admitting: Internal Medicine

## 2018-05-14 ENCOUNTER — Ambulatory Visit: Payer: Self-pay

## 2018-05-15 ENCOUNTER — Other Ambulatory Visit: Payer: Self-pay | Admitting: Internal Medicine

## 2018-05-15 DIAGNOSIS — M1712 Unilateral primary osteoarthritis, left knee: Secondary | ICD-10-CM

## 2018-05-15 NOTE — Telephone Encounter (Signed)
Patient called requesting more traMADol (ULTRAM) 50 MG tablet [[id number]]  States she is in pain and would like enough until her appointment on 05/27/2018. Patient uses [facility]. Please follow up with patient.

## 2018-05-27 ENCOUNTER — Encounter: Payer: Self-pay | Admitting: Internal Medicine

## 2018-05-27 ENCOUNTER — Ambulatory Visit: Payer: Self-pay | Attending: Internal Medicine | Admitting: Internal Medicine

## 2018-05-27 VITALS — BP 186/81 | HR 80 | Temp 98.0°F | Resp 16 | Wt 331.0 lb

## 2018-05-27 DIAGNOSIS — Z7951 Long term (current) use of inhaled steroids: Secondary | ICD-10-CM | POA: Insufficient documentation

## 2018-05-27 DIAGNOSIS — M1712 Unilateral primary osteoarthritis, left knee: Secondary | ICD-10-CM | POA: Insufficient documentation

## 2018-05-27 DIAGNOSIS — E1142 Type 2 diabetes mellitus with diabetic polyneuropathy: Secondary | ICD-10-CM

## 2018-05-27 DIAGNOSIS — Z79899 Other long term (current) drug therapy: Secondary | ICD-10-CM | POA: Insufficient documentation

## 2018-05-27 DIAGNOSIS — Z6841 Body Mass Index (BMI) 40.0 and over, adult: Secondary | ICD-10-CM | POA: Insufficient documentation

## 2018-05-27 DIAGNOSIS — N938 Other specified abnormal uterine and vaginal bleeding: Secondary | ICD-10-CM | POA: Insufficient documentation

## 2018-05-27 DIAGNOSIS — E1140 Type 2 diabetes mellitus with diabetic neuropathy, unspecified: Secondary | ICD-10-CM | POA: Insufficient documentation

## 2018-05-27 DIAGNOSIS — Z87891 Personal history of nicotine dependence: Secondary | ICD-10-CM | POA: Insufficient documentation

## 2018-05-27 DIAGNOSIS — J4540 Moderate persistent asthma, uncomplicated: Secondary | ICD-10-CM | POA: Insufficient documentation

## 2018-05-27 DIAGNOSIS — J449 Chronic obstructive pulmonary disease, unspecified: Secondary | ICD-10-CM | POA: Insufficient documentation

## 2018-05-27 DIAGNOSIS — G4700 Insomnia, unspecified: Secondary | ICD-10-CM | POA: Insufficient documentation

## 2018-05-27 DIAGNOSIS — D509 Iron deficiency anemia, unspecified: Secondary | ICD-10-CM | POA: Insufficient documentation

## 2018-05-27 DIAGNOSIS — Z7984 Long term (current) use of oral hypoglycemic drugs: Secondary | ICD-10-CM | POA: Insufficient documentation

## 2018-05-27 DIAGNOSIS — R030 Elevated blood-pressure reading, without diagnosis of hypertension: Secondary | ICD-10-CM | POA: Insufficient documentation

## 2018-05-27 LAB — GLUCOSE, POCT (MANUAL RESULT ENTRY): POC Glucose: 97 mg/dl (ref 70–99)

## 2018-05-27 MED ORDER — METFORMIN HCL 500 MG PO TABS: 500.0000 mg | ORAL_TABLET | Freq: Two times a day (BID) | ORAL | 5 refills | Status: DC

## 2018-05-27 MED ORDER — TRAZODONE HCL 100 MG PO TABS: 100.0000 mg | ORAL_TABLET | Freq: Every evening | ORAL | 4 refills | Status: DC | PRN

## 2018-05-27 MED ORDER — DULOXETINE HCL 60 MG PO CPEP: 60.0000 mg | ORAL_CAPSULE | Freq: Every day | ORAL | 6 refills | Status: DC

## 2018-05-27 MED ORDER — GABAPENTIN 100 MG PO CAPS: 100.0000 mg | ORAL_CAPSULE | Freq: Every day | ORAL | 2 refills | Status: DC

## 2018-05-27 MED ORDER — MOMETASONE FURO-FORMOTEROL FUM 100-5 MCG/ACT IN AERO: 2.0000 | INHALATION_SPRAY | Freq: Two times a day (BID) | RESPIRATORY_TRACT | 6 refills | Status: DC

## 2018-05-27 MED ORDER — ALBUTEROL SULFATE HFA 108 (90 BASE) MCG/ACT IN AERS: 2.0000 | INHALATION_SPRAY | Freq: Four times a day (QID) | RESPIRATORY_TRACT | 5 refills | Status: DC | PRN

## 2018-05-27 MED ORDER — MELOXICAM 15 MG PO TABS: 15.0000 mg | ORAL_TABLET | Freq: Every day | ORAL | 6 refills | Status: DC

## 2018-05-27 MED FILL — $PROVENTIL HFA 90 MCG INHAL: 108 (90 BAS | 90 days supply | Qty: 3 | Fill #0

## 2018-05-27 MED FILL — metFORMIN HCL 500 MG TABS: 500 | 30 days supply | Qty: 60 | Fill #0

## 2018-05-27 MED FILL — MELOXICAM 15 MG TABLET: 15 | 30 days supply | Qty: 30 | Fill #0

## 2018-05-27 MED FILL — GABAPENTIN 100 MG CAPSULE: 100 | 30 days supply | Qty: 90 | Fill #0

## 2018-05-27 MED FILL — DULoxetine HCL 60 MG CPEP: 60 | 30 days supply | Qty: 30 | Fill #0

## 2018-05-27 MED FILL — traZODone HCL 100 MG TABS: 100 | 30 days supply | Qty: 30 | Fill #0

## 2018-05-27 MED FILL — $Dulera 100 mcg Inhaler: 100-5 | 90 days supply | Qty: 39 | Fill #0

## 2018-05-27 NOTE — Patient Instructions (Addendum)
Restart Cymbalta and Meloxicam for your arthritis pain.  Let me know once you are approved for the [facility] card so that we can get you back to orthopedics.   Last weight was 338 on 03/26/2018

## 2018-05-27 NOTE — Progress Notes (Signed)
Patient ID: Hannah Baldwin, female    DOB: Feb 29, 1971  MRN: [id number]  CC: Diabetes   Subjective: Hannah Baldwin is a 47 y.o. female who presents for chronic ds management.  Her concerns today include:  Patient with history of diabetes type 2 with neuropathy, obesity, mod persistent asthma, osteoarthritis of the left knee,iron deficiency anemia,DUB with fibroid, opioidaddictionon Methadone.  No longer on Methadone.  She has been off of it for about 5 months.  About 5 mth.  Wean off and cost became an issue.  OA:  C/o a lot of pain in LT knee.  She had seen our PA about 2 months ago and was given a limited supply of tramadol.  She is requesting a refill on that.  I inquired about whether she was still taking Cymbalta as this was helpful to her.  Patient was not sure.  I contacted the pharmacy and apparently she has not filled in over 2 months.  She had seen Ortho earlier this year and was given a steroid injection which she found very helpful.  She has to apply for the orange card for [place] to refer back to Ortho.  -After repeated questions about what she was or was not taking patient became very upset stating that "I am knee is hurting and give me what you want to give me."  She then declined to answer any further questions about her other medical issues by stating that "I am fine."  DM:  Not checking BS   Patient Active Problem List   Diagnosis Date Noted  . Fibroid uterus 01/09/2018  . Primary osteoarthritis of left knee 04/09/2017  . Insomnia 11/03/2015  . Onychomycosis of toenail 07/25/2015  . Diabetic neuropathy ([place]) 07/25/2015  . DUB (dysfunctional uterine bleeding) 02/03/2015  . Diabetes type 2, controlled ([place]) 01/25/2015  . Severe obesity (BMI >= 40) ([place]) 01/25/2015  . Moderate persistent asthma 01/21/2015  . COPD (chronic obstructive pulmonary disease) ([place]) 01/20/2015  . Cardiomegaly 06/09/2014  . Hypochromic microcytic anemia 06/08/2014  . Former smoker 01/01/2013       Current Outpatient Medications on File Prior to Visit  Medication Sig Dispense Refill  . acetaminophen (TYLENOL) 325 MG tablet Take 2 tablets (650 mg total) by mouth every 8 (eight) hours. 60 tablet 4  . albuterol (PROVENTIL HFA;VENTOLIN HFA) 108 (90 Base) MCG/ACT inhaler Inhale 2 puffs into the lungs every 6 (six) hours as needed for wheezing or shortness of breath. 3 Inhaler 5  . albuterol (PROVENTIL) (2.5 MG/3ML) 0.083% nebulizer solution Take 2.5 mg by nebulization every 6 (six) hours as needed for wheezing or shortness of breath.    . blood glucose meter kit and supplies KIT Dispense based on patient and insurance preference. Use up to four times daily as directed. (FOR ICD-9 250.00, 250.01). 1 each 0  . Blood Glucose Monitoring Suppl (TRUE METRIX METER) w/Device KIT USE AS INSTRUCTED 1 kit 0  . DULoxetine (CYMBALTA) 30 MG capsule Take 1 capsule (30 mg total) by mouth daily. 30 capsule 3  . DULoxetine (CYMBALTA) 60 MG capsule Take 1 capsule (60 mg total) by mouth daily. 30 capsule 6  . ferrous sulfate 325 (65 FE) MG tablet Take 1 tablet (325 mg total) by mouth 2 (two) times daily with a meal. 120 tablet 2  . furosemide (LASIX) 20 MG tablet Take 1 tablet (20 mg total) by mouth daily. For swelling in legs (Patient not taking: Reported on 01/09/2018) 30 tablet 3  . gabapentin (NEURONTIN)  100 MG capsule Take 1-3 capsules (100-300 mg total) by mouth at bedtime. 90 capsule 2  . glucose blood (TRUE METRIX BLOOD GLUCOSE TEST) test strip Use as instructed 100 each 12  . megestrol (MEGACE) 40 MG tablet Take 1 tablet (40 mg total) by mouth 2 (two) times daily. 120 tablet 1  . metFORMIN (GLUCOPHAGE) 500 MG tablet TAKE 1 TABLET BY MOUTH 2 TIMES DAILY. 60 tablet 2  . mometasone-formoterol (DULERA) 100-5 MCG/ACT AERO Inhale 2 puffs into the lungs 2 (two) times daily. 13 g 6  . naproxen (NAPROSYN) 500 MG tablet Take 1 tablet (500 mg total) by mouth 2 (two) times daily with a meal. With food for pain 60  tablet 1  . nystatin cream (MYCOSTATIN) Apply to affected area BID 30 g 0  . traMADol (ULTRAM) 50 MG tablet Take 1 tablet (50 mg total) by mouth 2 (two) times daily. Prn pain 30 tablet 0  . traZODone (DESYREL) 50 MG tablet Take 0.5-1 tablets (25-50 mg total) by mouth at bedtime as needed for sleep. 30 tablet 3  . TRUEPLUS LANCETS 28G MISC Check blood sugar TID & QHS 100 each 2   Current Facility-Administered Medications on File Prior to Visit  Medication Dose Route Frequency Provider Last Rate Last Dose  . albuterol (PROVENTIL) (2.5 MG/3ML) 0.083% nebulizer solution 2.5 mg  2.5 mg Nebulization Once McClung, Angela M, PA-C        No Known Allergies  Social History   Socioeconomic History  . Marital status: Widowed    Spouse name: Not on file  . Number of children: Not on file  . Years of education: Not on file  . Highest education level: Not on file  Occupational History  . Not on file  Social Needs  . Financial resource strain: Not on file  . Food insecurity:    Worry: Not on file    Inability: Not on file  . Transportation needs:    Medical: Not on file    Non-medical: Not on file  Tobacco Use  . Smoking status: Former Smoker    Packs/day: 0.25    Years: 20.00    Pack years: 5.00    Types: Cigarettes    Last attempt to quit: 01/18/2015    Years since quitting: 3.3  . Smokeless tobacco: Never Used  Substance and Sexual Activity  . Alcohol use: Yes    Comment: 01/20/2015 "dank some alcohol on my birthday, cookouts,  etc,;  maybe once/month"  . Drug use: No  . Sexual activity: Not Currently    Birth control/protection: None  Lifestyle  . Physical activity:    Days per week: Not on file    Minutes per session: Not on file  . Stress: Not on file  Relationships  . Social connections:    Talks on phone: Not on file    Gets together: Not on file    Attends religious service: Not on file    Active member of club or organization: Not on file    Attends meetings of clubs  or organizations: Not on file    Relationship status: Not on file  . Intimate partner violence:    Fear of current or ex partner: Not on file    Emotionally abused: Not on file    Physically abused: Not on file    Forced sexual activity: Not on file  Other Topics Concern  . Not on file  Social History Narrative  . Not on file      Family History  Problem Relation Age of Onset  . Asthma Mother   . Asthma Daughter     Past Surgical History:  Procedure Laterality Date  . CESAREAN SECTION  1992    ROS: Review of Systems  PHYSICAL EXAM: BP (!) 186/81   Pulse 80   Temp 98 F (36.7 C) (Oral)   Resp 16   Wt (!) 331 lb (150.1 kg)   SpO2 100%   BMI 56.82 kg/m   Physical Exam  General appearance - alert, well appearing, and in no distress Mental status - normal mood, behavior, speech, dress, motor activity, and thought processes Chest - clear to auscultation, no wheezes, rales or rhonchi, symmetric air entry Heart - normal rate, regular rhythm, normal S1, S2, no murmurs, rubs, clicks or gallops   Results for orders placed or performed in visit on 05/27/18  POCT glucose (manual entry)  Result Value Ref Range   POC Glucose 97 70 - 99 mg/dl   Lab Results  Component Value Date   HGBA1C 5.6 01/01/2018    ASSESSMENT AND PLAN: 1. Primary osteoarthritis of left knee We will refill Cymbalta and meloxicam.  She was doing well on Cymbalta the last time I saw her - DULoxetine (CYMBALTA) 60 MG capsule; Take 1 capsule (60 mg total) by mouth daily.  Dispense: 30 capsule; Refill: 6 - meloxicam (MOBIC) 15 MG tablet; Take 1 tablet (15 mg total) by mouth daily.  Dispense: 30 tablet; Refill: 6  2. Type 2 diabetes, controlled, with neuropathy (HCC) Refilled metformin and gabapentin per her request - POCT glucose (manual entry) - metFORMIN (GLUCOPHAGE) 500 MG tablet; Take 1 tablet (500 mg total) by mouth 2 (two) times daily.  Dispense: 60 tablet; Refill: 5 - gabapentin (NEURONTIN) 100  MG capsule; Take 1-3 capsules (100-300 mg total) by mouth at bedtime.  Dispense: 90 capsule; Refill: 2  3. Insomnia After I had left the room patient had my CMA, and asked me to give her an increased dose of the trazodone as the 50 mg were not helping as much.  I will increase to 100 mg - traZODone (DESYREL) 100 MG tablet; Take 1 tablet (100 mg total) by mouth at bedtime as needed for sleep.  Dispense: 30 tablet; Refill: 4  4. Moderate persistent asthma without complication - mometasone-formoterol (DULERA) 100-5 MCG/ACT AERO; Inhale 2 puffs into the lungs 2 (two) times daily.  Dispense: 13 g; Refill: 6 - albuterol (PROVENTIL HFA;VENTOLIN HFA) 108 (90 Base) MCG/ACT inhaler; Inhale 2 puffs into the lungs every 6 (six) hours as needed for wheezing or shortness of breath.  Dispense: 3 Inhaler; Refill: 5  I noted after the visit that blood pressure was elevated.  We will have my CMA contact her about coming back for nurse only blood pressure check at her earliest convenience. Patient was given the opportunity to ask questions.  Patient verbalized understanding of the plan and was able to repeat key elements of the plan.   Orders Placed This Encounter  Procedures  . POCT glucose (manual entry)     Requested Prescriptions    No prescriptions requested or ordered in this encounter    No follow-ups on file.  Karle Plumber, MD, FACP

## 2018-06-05 ENCOUNTER — Ambulatory Visit: Payer: Self-pay | Attending: Internal Medicine | Admitting: Pharmacist

## 2018-06-05 ENCOUNTER — Encounter: Payer: Self-pay | Admitting: Pharmacist

## 2018-06-05 VITALS — BP 149/89 | HR 80

## 2018-06-05 DIAGNOSIS — R030 Elevated blood-pressure reading, without diagnosis of hypertension: Secondary | ICD-10-CM | POA: Insufficient documentation

## 2018-06-05 DIAGNOSIS — Z7689 Persons encountering health services in other specified circumstances: Secondary | ICD-10-CM | POA: Insufficient documentation

## 2018-06-05 NOTE — Patient Instructions (Signed)
Thank you for coming to see us today.   Blood pressure today is elevated.   Limiting salt and caffeine, as well as exercising as able for at least 30 minutes for 5 days out of the week, can also help you lower your blood pressure.  Take your blood pressure at home if you are able. Please write down these numbers and bring them to your visits.  If you have any questions about medications, please call me [phone].  Luke.  

## 2018-06-05 NOTE — Progress Notes (Signed)
   S:    PCP: Dr. Wynetta Emery  Patient arrives in good spirits. Presents to the clinic for BP re-check. Patient was referred and last seen on 05/27/18. Her BP on that visit was 186/81.   Patient does not take any anti-hypertensives. Denies CP, SOB, HA, or dizziness.  Dietary habits include:  - does not limit salt - consumes caffeine (coffee) throughout the day  Exercise habits include: - Pt states she gets exercise through work but nothing additional  Family / Social history:  - FH: nothing pertinent listed for HTN - Tobacco: former smoker - Alcohol: soccially  Home BP readings:  - does not record at home  O:  L arm after 5 minutes rest (thigh cuff was used): 149/89; HR 80  Last 3 Office BP readings: BP Readings from Last 3 Encounters:  05/27/18 (!) 186/81  03/26/18 137/70  02/07/18 129/80    BMET    Component Value Date/Time   NA 142 11/18/2017 1144   K 4.8 11/18/2017 1144   CL 103 11/18/2017 1144   CO2 26 11/18/2017 1144   GLUCOSE 76 11/18/2017 1144   GLUCOSE 68 12/20/2016 1048   BUN 7 11/18/2017 1144   CREATININE 0.87 11/18/2017 1144   CREATININE 0.85 12/20/2016 1048   CALCIUM 9.2 11/18/2017 1144   GFRNONAA 81 11/18/2017 1144   GFRNONAA 71 09/05/2015 1028   GFRAA 93 11/18/2017 1144   GFRAA 82 09/05/2015 1028    Renal function: CrCl cannot be calculated (Patient's most recent lab result is older than the maximum 21 days allowed.).  A/P: Patient without diagnosis of HTN with elevated BP today and at previous visit. Dr. Wynetta Emery recommends to start low-dose amlodipine. Patient wishes to work on lifestyle and follow-up with PCP. BP Goal <130/80 mmHg.   -Counseled on lifestyle modifications for blood pressure control including reduced dietary sodium, increased exercise, adequate sleep  Results reviewed and written information provided.   Total time in face-to-face counseling 15 minutes.   F/U Clinic Visit 07/15/18.    Patient seen with: Marylene Buerger, PharmD  Candidate [employer] Class of 2021  Benard Halsted, PharmD, [place] [phone]

## 2018-06-26 MED FILL — MEGESTROL 40 MG TABLET: 40 | 30 days supply | Qty: 60 | Fill #1

## 2018-07-15 ENCOUNTER — Ambulatory Visit: Payer: Self-pay | Admitting: Internal Medicine

## 2018-07-16 MED FILL — traZODone HCL 100 MG TABS: 100 | 30 days supply | Qty: 30 | Fill #1

## 2018-07-16 MED FILL — DULoxetine HCL 60 MG CPEP: 60 | 30 days supply | Qty: 30 | Fill #1

## 2018-07-22 MED FILL — MELOXICAM 15 MG TABLET: 15 | 30 days supply | Qty: 30 | Fill #1

## 2018-08-04 ENCOUNTER — Encounter: Payer: Self-pay | Admitting: Internal Medicine

## 2018-08-04 ENCOUNTER — Ambulatory Visit: Payer: Self-pay | Attending: Internal Medicine | Admitting: Internal Medicine

## 2018-08-04 VITALS — BP 124/84 | HR 71 | Temp 98.2°F | Resp 16 | Wt 333.6 lb

## 2018-08-04 DIAGNOSIS — Z7984 Long term (current) use of oral hypoglycemic drugs: Secondary | ICD-10-CM | POA: Insufficient documentation

## 2018-08-04 DIAGNOSIS — J4540 Moderate persistent asthma, uncomplicated: Secondary | ICD-10-CM

## 2018-08-04 DIAGNOSIS — E119 Type 2 diabetes mellitus without complications: Secondary | ICD-10-CM

## 2018-08-04 DIAGNOSIS — M1712 Unilateral primary osteoarthritis, left knee: Secondary | ICD-10-CM

## 2018-08-04 DIAGNOSIS — G4700 Insomnia, unspecified: Secondary | ICD-10-CM | POA: Insufficient documentation

## 2018-08-04 DIAGNOSIS — Z23 Encounter for immunization: Secondary | ICD-10-CM

## 2018-08-04 DIAGNOSIS — E1142 Type 2 diabetes mellitus with diabetic polyneuropathy: Secondary | ICD-10-CM

## 2018-08-04 DIAGNOSIS — L304 Erythema intertrigo: Secondary | ICD-10-CM

## 2018-08-04 DIAGNOSIS — Z6841 Body Mass Index (BMI) 40.0 and over, adult: Secondary | ICD-10-CM | POA: Insufficient documentation

## 2018-08-04 DIAGNOSIS — Z2821 Immunization not carried out because of patient refusal: Secondary | ICD-10-CM

## 2018-08-04 DIAGNOSIS — Z79899 Other long term (current) drug therapy: Secondary | ICD-10-CM | POA: Insufficient documentation

## 2018-08-04 DIAGNOSIS — J449 Chronic obstructive pulmonary disease, unspecified: Secondary | ICD-10-CM | POA: Insufficient documentation

## 2018-08-04 DIAGNOSIS — Z87891 Personal history of nicotine dependence: Secondary | ICD-10-CM | POA: Insufficient documentation

## 2018-08-04 DIAGNOSIS — D509 Iron deficiency anemia, unspecified: Secondary | ICD-10-CM | POA: Insufficient documentation

## 2018-08-04 LAB — POCT GLYCOSYLATED HEMOGLOBIN (HGB A1C): HbA1c, POC (prediabetic range): 5.7 % (ref 5.7–6.4)

## 2018-08-04 LAB — GLUCOSE, POCT (MANUAL RESULT ENTRY): POC GLUCOSE: 113 mg/dL — AB (ref 70–99)

## 2018-08-04 MED ORDER — TETANUS-DIPHTH-ACELL PERTUSSIS 5-2.5-18.5 LF-MCG/0.5 IM SUSP: 0.5000 mL | Freq: Once | INTRAMUSCULAR | 0 refills | Status: AC

## 2018-08-04 MED ORDER — FLUCONAZOLE 150 MG PO TABS: ORAL_TABLET | ORAL | 0 refills | Status: DC

## 2018-08-04 MED ORDER — GABAPENTIN 300 MG PO CAPS: 300.0000 mg | ORAL_CAPSULE | Freq: Two times a day (BID) | ORAL | 6 refills | Status: DC

## 2018-08-04 NOTE — Patient Instructions (Addendum)
Increase gabapentin to 300 mg twice a day. I have prescribed an antifungal pill called Diflucan that she will take once a week for 4 weeks.  I advised eating healthier snacks rather than chocolate bars.  Last weight was 331   Td Vaccine (Tetanus and Diphtheria): What You Need to Know 1. Why get vaccinated? Tetanus  and diphtheria are very serious diseases. They are rare in the [place] today, but people who do become infected often have severe complications. Td vaccine is used to protect adolescents and adults from both of these diseases. Both tetanus and diphtheria are infections caused by bacteria. Diphtheria spreads from person to person through coughing or sneezing. Tetanus-causing bacteria enter the body through cuts, scratches, or wounds. TETANUS (lockjaw) causes painful muscle tightening and stiffness, usually all over the body.  It can lead to tightening of muscles in the head and neck so you can't open your mouth, swallow, or sometimes even breathe. Tetanus kills about 1 out of every 10 people who are infected even after receiving the best medical care.  DIPHTHERIA can cause a thick coating to form in the back of the throat.  It can lead to breathing problems, paralysis, heart failure, and death.  Before vaccines, as many as 200,000 cases of diphtheria and hundreds of cases of tetanus were reported in the [place] each year. Since vaccination began, reports of cases for both diseases have dropped by about 99%. 2. Td vaccine Td vaccine can protect adolescents and adults from tetanus and diphtheria. Td is usually given as a booster dose every 10 years but it can also be given earlier after a severe and dirty wound or burn. Another vaccine, called Tdap, which protects against pertussis in addition to tetanus and diphtheria, is sometimes recommended instead of Td vaccine. Your doctor or the person giving you the vaccine can give you more information. Td may safely be given at  the same time as other vaccines. 3. Some people should not get this vaccine  A person who has ever had a life-threatening allergic reaction after a previous dose of any tetanus or diphtheria containing vaccine, OR has a severe allergy to any part of this vaccine, should not get Td vaccine. Tell the person giving the vaccine about any severe allergies.  Talk to your doctor if you: ? had severe pain or swelling after any vaccine containing diphtheria or tetanus, ? ever had a condition called Guillain Barre Syndrome (GBS), ? aren't feeling well on the day the shot is scheduled. 4. What are the risks from Td vaccine? With any medicine, including vaccines, there is a chance of side effects. These are usually mild and go away on their own. Serious reactions are also possible but are rare. Most people who get Td vaccine do not have any problems with it. Mild problems following Td vaccine: (Did not interfere with activities)  Pain where the shot was given (about 8 people in 10)  Redness or swelling where the shot was given (about 1 person in 4)  Mild fever (rare)  Headache (about 1 person in 4)  Tiredness (about 1 person in 4)  Moderate problems following Td vaccine: (Interfered with activities, but did not require medical attention)  Fever over 102F (rare)  Severe problems following Td vaccine: (Unable to perform usual activities; required medical attention)  Swelling, severe pain, bleeding and/or redness in the arm where the shot was given (rare).  Problems that could happen after any vaccine:  People sometimes faint after  a medical procedure, including vaccination. Sitting or lying down for about 15 minutes can help prevent fainting, and injuries caused by a fall. Tell your doctor if you feel dizzy, or have vision changes or ringing in the ears.  Some people get severe pain in the shoulder and have difficulty moving the arm where a shot was given. This happens very rarely.  Any  medication can cause a severe allergic reaction. Such reactions from a vaccine are very rare, estimated at fewer than 1 in a million doses, and would happen within a few minutes to a few hours after the vaccination. As with any medicine, there is a very remote chance of a vaccine causing a serious injury or death. The safety of vaccines is always being monitored. For more information, visit: http://www.aguilar.org/ 5. What if there is a serious reaction? What should I look for? Look for anything that concerns you, such as signs of a severe allergic reaction, very high fever, or unusual behavior. Signs of a severe allergic reaction can include hives, swelling of the face and throat, difficulty breathing, a fast heartbeat, dizziness, and weakness. These would usually start a few minutes to a few hours after the vaccination. What should I do?  If you think it is a severe allergic reaction or other emergency that can't wait, call 9-1-1 or get the person to the nearest hospital. Otherwise, call your doctor.  Afterward, the reaction should be reported to the Vaccine Adverse Event Reporting System (VAERS). Your doctor might file this report, or you can do it yourself through the VAERS web site at www.vaers.SamedayNews.es, or by calling (418)307-7128. ? VAERS does not give medical advice. 6. The National Vaccine Injury Compensation Program The [employer] Vaccine Injury Compensation Program (VICP) is a federal program that was created to compensate people who may have been injured by certain vaccines. Persons who believe they may have been injured by a vaccine can learn about the program and about filing a claim by calling 509-037-3558 or visiting the [employer] website at GoldCloset.com.ee. There is a time limit to file a claim for compensation. 7. How can I learn more?  Ask your doctor. He or she can give you the vaccine package insert or suggest other sources of information.  Call your local or  state health department.  Contact the Centers for Disease Control and Prevention (CDC): ? Call (619)606-4346 (1-800-CDC-INFO) ? Visit CDC's website at http://hunter.com/ CDC Td Vaccine VIS (01/17/16) This information is not intended to replace advice given to you by your health care provider. Make sure you discuss any questions you have with your health care provider. Document Released: 07/22/2006 Document Revised: 06/14/2016 Document Reviewed: 06/14/2016 Elsevier Interactive Patient Education  2017 [employer].

## 2018-08-04 NOTE — Progress Notes (Signed)
Patient ID: Hannah Baldwin, female    DOB: 10-Sep-1971  MRN: [id number]  CC: Diabetes   Subjective: Hannah Baldwin is a 47 y.o. female who presents for chronic ds management. 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  Started a few mths ago.  Goes and comes LT knee gives out unexpectedly at times but not often.  She has a cane but does not use it.  DM:  Checking BS QOD, range 115-120 Eating habits:  Most days she eats once a day because appetite not as big as before.  However she admits that her weakness is eating a chocolate bar every night  C/o itching rash b/w the skin folds on thigh. Tried with an antifungal cream in the past but she states that this was not helpful.  Reports being given an antifungal pill in the past that helped a lot more.    Asthma: Denies any recent flare.  Reports compliance with Dulera.  She declines flu shot.  Patient Active Problem List   Diagnosis Date Noted  . Fibroid uterus 01/09/2018  . Primary osteoarthritis of left knee 04/09/2017  . Insomnia 11/03/2015  . Onychomycosis of toenail 07/25/2015  . Diabetic neuropathy ([place]) 07/25/2015  . DUB (dysfunctional uterine bleeding) 02/03/2015  . Diabetes type 2, controlled ([place]) 01/25/2015  . Severe obesity (BMI >= 40) ([place]) 01/25/2015  . Moderate persistent asthma 01/21/2015  . COPD (chronic obstructive pulmonary disease) ([place]) 01/20/2015  . Cardiomegaly 06/09/2014  . Hypochromic microcytic anemia 06/08/2014  . Former smoker 01/01/2013     Current Outpatient Medications on File Prior to Visit  Medication Sig Dispense Refill  . acetaminophen (TYLENOL) 325 MG tablet Take 2 tablets (650 mg total) by mouth every 8 (eight) hours. 60 tablet 4  .  albuterol (PROVENTIL HFA;VENTOLIN HFA) 108 (90 Base) MCG/ACT inhaler Inhale 2 puffs into the lungs every 6 (six) hours as needed for wheezing or shortness of breath. 3 Inhaler 5  . albuterol (PROVENTIL) (2.5 MG/3ML) 0.083% nebulizer solution Take 2.5 mg by nebulization every 6 (six) hours as needed for wheezing or shortness of breath.    . blood glucose meter kit and supplies KIT Dispense based on patient and insurance preference. Use up to four times daily as directed. (FOR ICD-9 250.00, 250.01). 1 each 0  . Blood Glucose Monitoring Suppl (TRUE METRIX METER) w/Device KIT USE AS INSTRUCTED 1 kit 0  . DULoxetine (CYMBALTA) 60 MG capsule Take 1 capsule (60 mg total) by mouth daily. 30 capsule 6  . ferrous sulfate 325 (65 FE) MG tablet Take 1 tablet (325 mg total) by mouth 2 (two) times daily with a meal. 120 tablet 2  . gabapentin (NEURONTIN) 100 MG capsule Take 1-3 capsules (100-300 mg total) by mouth at bedtime. 90 capsule 2  . glucose blood (TRUE METRIX BLOOD GLUCOSE TEST) test strip Use as instructed 100 each 12  . megestrol (MEGACE) 40 MG tablet Take 1 tablet (40 mg total) by mouth 2 (two) times daily. 120 tablet 1  . meloxicam (MOBIC) 15 MG tablet Take 1 tablet (15 mg total) by mouth daily. 30 tablet 6  . metFORMIN (GLUCOPHAGE) 500 MG tablet Take 1 tablet (500 mg total) by mouth 2 (two) times daily. 60 tablet 5  . mometasone-formoterol (DULERA) 100-5 MCG/ACT AERO  Inhale 2 puffs into the lungs 2 (two) times daily. 13 g 6  . nystatin cream (MYCOSTATIN) Apply to affected area BID 30 g 0  . traZODone (DESYREL) 100 MG tablet Take 1 tablet (100 mg total) by mouth at bedtime as needed for sleep. 30 tablet 4  . TRUEPLUS LANCETS 28G MISC Check blood sugar TID & QHS 100 each 2   No current facility-administered medications on file prior to visit.     No Known Allergies  Social History   Socioeconomic History  . Marital status: Widowed    Spouse name: Not on file  . Number of children: Not on file   . Years of education: Not on file  . Highest education level: Not on file  Occupational History  . Not on file  Social Needs  . Financial resource strain: Not on file  . Food insecurity:    Worry: Not on file    Inability: Not on file  . Transportation needs:    Medical: Not on file    Non-medical: Not on file  Tobacco Use  . Smoking status: Former Smoker    Packs/day: 0.25    Years: 20.00    Pack years: 5.00    Types: Cigarettes    Last attempt to quit: 01/18/2015    Years since quitting: 3.5  . Smokeless tobacco: Never Used  Substance and Sexual Activity  . Alcohol use: Yes    Comment: 01/20/2015 "dank some alcohol on my birthday, cookouts,  etc,;  maybe once/month"  . Drug use: No  . Sexual activity: Not Currently    Birth control/protection: None  Lifestyle  . Physical activity:    Days per week: Not on file    Minutes per session: Not on file  . Stress: Not on file  Relationships  . Social connections:    Talks on phone: Not on file    Gets together: Not on file    Attends religious service: Not on file    Active member of club or organization: Not on file    Attends meetings of clubs or organizations: Not on file    Relationship status: Not on file  . Intimate partner violence:    Fear of current or ex partner: Not on file    Emotionally abused: Not on file    Physically abused: Not on file    Forced sexual activity: Not on file  Other Topics Concern  . Not on file  Social History Narrative  . Not on file    Family History  Problem Relation Age of Onset  . Asthma Mother   . Asthma Daughter     Past Surgical History:  Procedure Laterality Date  . CESAREAN SECTION  1992    ROS: Review of Systems Negative except as above   PHYSICAL EXAM: BP 124/84   Pulse 71   Temp 98.2 F (36.8 C) (Oral)   Resp 16   Wt (!) 333 lb 9.6 oz (151.3 kg)   SpO2 96%   BMI 57.26 kg/m   Wt Readings from Last 3 Encounters:  08/04/18 (!) 333 lb 9.6 oz (151.3 kg)    05/27/18 (!) 331 lb (150.1 kg)  03/26/18 (!) 338 lb (153.3 kg)    Physical Exam  General appearance - alert, well appearing, obese AAF and in no distress Mental status - normal mood, behavior, speech, dress, motor activity, and thought processes Neck - supple, no significant adenopathy Chest -few scattered wheezes.  Breath sounds equal bilaterally  with good air entry  Heart - normal rate, regular rhythm, normal S1, S2, no murmurs, rubs, clicks or gallops Musculoskeletal -LT knee: Large body habitus.  No point tenderness.  Mild discomfort in passive range of motion Extremities - peripheral pulses normal, no pedal edema, no clubbing or cyanosis Skin -hyperpigmented scaly rash between the skin folds on both thigh   Results for orders placed or performed in visit on 08/04/18  POCT glucose (manual entry)  Result Value Ref Range   POC Glucose 113 (A) 70 - 99 mg/dl  POCT glycosylated hemoglobin (Hb A1C)  Result Value Ref Range   Hemoglobin A1C     HbA1c POC (<> result, manual entry)     HbA1c, POC (prediabetic range) 5.7 5.7 - 6.4 %   HbA1c, POC (controlled diabetic range)     ASSESSMENT AND PLAN: 1. Type 2 diabetes, controlled, with peripheral neuropathy (HCC) At goal Continue metformin. Dietary counseling given.  Encouraged her to choose healthier snacks instead of the chocolate bar - POCT glucose (manual entry) - POCT glycosylated hemoglobin (Hb A1C) - gabapentin (NEURONTIN) 300 MG capsule; Take 1 capsule (300 mg total) by mouth 2 (two) times daily.  Dispense: 60 capsule; Refill: 6  2. Pruritic intertrigo - fluconazole (DIFLUCAN) 150 MG tablet; 1 tab PO Q wk for 4 weeks  Dispense: 4 tablet; Refill: 0  3. Primary osteoarthritis of left knee Encourage weight loss.  Burning that she is having sounds like neuropathic pain.  I recommend increasing gabapentin from 300 mg daily to 300 mg twice a day  4. Morbid obesity ([place]) See #1 and 3 above  5. Moderate persistent asthma without  complication Continue current inhalers.  Patient declines flu shot  6. Need for Tdap vaccination  7. Influenza vaccination declined     Patient was given the opportunity to ask questions.  Patient verbalized understanding of the plan and was able to repeat key elements of the plan.   Orders Placed This Encounter  Procedures  . POCT glucose (manual entry)  . POCT glycosylated hemoglobin (Hb A1C)     Requested Prescriptions    No prescriptions requested or ordered in this encounter    No follow-ups on file.  Karle Plumber, MD, FACP

## 2018-08-14 MED FILL — FLUCONAZOLE 150 MG TABS: 150 | 28 days supply | Qty: 4 | Fill #0

## 2018-08-14 MED FILL — GABAPENTIN 300 MG CAPSULE: 300 | 30 days supply | Qty: 60 | Fill #0

## 2018-08-18 MED FILL — MELOXICAM 15 MG TABLET: 15 | 30 days supply | Qty: 30 | Fill #2

## 2018-08-18 MED FILL — traZODone HCL 100 MG TABS: 100 | 30 days supply | Qty: 30 | Fill #2

## 2018-08-18 MED FILL — DULoxetine HCL 60 MG CPEP: 60 | 30 days supply | Qty: 30 | Fill #2

## 2018-09-02 MED FILL — MEGESTROL 40 MG TABLET: 40 | 30 days supply | Qty: 60 | Fill #2

## 2018-09-03 MED FILL — metFORMIN HCL 500 MG TABS: 500 | 30 days supply | Qty: 60 | Fill #1

## 2018-09-18 MED FILL — traZODone HCL 100 MG TABS: 100 | 30 days supply | Qty: 30 | Fill #3

## 2018-09-18 MED FILL — MEGESTROL 40 MG TABLET: 40 | 30 days supply | Qty: 60 | Fill #2

## 2018-09-18 MED FILL — DULoxetine HCL 60 MG CPEP: 60 | 30 days supply | Qty: 30 | Fill #3

## 2018-09-22 ENCOUNTER — Other Ambulatory Visit: Payer: Self-pay | Admitting: Internal Medicine

## 2018-09-22 DIAGNOSIS — L304 Erythema intertrigo: Secondary | ICD-10-CM

## 2018-09-23 MED FILL — FLUCONAZOLE 150 MG TABS: 150 | 28 days supply | Qty: 4 | Fill #0

## 2018-09-25 MED FILL — MELOXICAM 15 MG TABLET: 15 | 30 days supply | Qty: 30 | Fill #3

## 2018-10-02 ENCOUNTER — Encounter: Payer: Self-pay | Admitting: Family Medicine

## 2018-10-02 ENCOUNTER — Ambulatory Visit: Payer: Self-pay | Attending: Family Medicine | Admitting: Family Medicine

## 2018-10-02 VITALS — BP 127/76 | HR 95 | Temp 98.6°F | Resp 18 | Ht 63.5 in | Wt 330.0 lb

## 2018-10-02 DIAGNOSIS — J45909 Unspecified asthma, uncomplicated: Secondary | ICD-10-CM | POA: Insufficient documentation

## 2018-10-02 DIAGNOSIS — J209 Acute bronchitis, unspecified: Secondary | ICD-10-CM

## 2018-10-02 DIAGNOSIS — Z7984 Long term (current) use of oral hypoglycemic drugs: Secondary | ICD-10-CM | POA: Insufficient documentation

## 2018-10-02 DIAGNOSIS — R092 Respiratory arrest: Secondary | ICD-10-CM | POA: Insufficient documentation

## 2018-10-02 DIAGNOSIS — R0602 Shortness of breath: Secondary | ICD-10-CM | POA: Insufficient documentation

## 2018-10-02 DIAGNOSIS — Z791 Long term (current) use of non-steroidal anti-inflammatories (NSAID): Secondary | ICD-10-CM | POA: Insufficient documentation

## 2018-10-02 DIAGNOSIS — J4540 Moderate persistent asthma, uncomplicated: Secondary | ICD-10-CM

## 2018-10-02 DIAGNOSIS — R0789 Other chest pain: Secondary | ICD-10-CM | POA: Insufficient documentation

## 2018-10-02 DIAGNOSIS — R5383 Other fatigue: Secondary | ICD-10-CM | POA: Insufficient documentation

## 2018-10-02 DIAGNOSIS — E119 Type 2 diabetes mellitus without complications: Secondary | ICD-10-CM | POA: Insufficient documentation

## 2018-10-02 DIAGNOSIS — R0981 Nasal congestion: Secondary | ICD-10-CM | POA: Insufficient documentation

## 2018-10-02 DIAGNOSIS — Z87891 Personal history of nicotine dependence: Secondary | ICD-10-CM | POA: Insufficient documentation

## 2018-10-02 DIAGNOSIS — J45901 Unspecified asthma with (acute) exacerbation: Secondary | ICD-10-CM

## 2018-10-02 DIAGNOSIS — Z79899 Other long term (current) drug therapy: Secondary | ICD-10-CM | POA: Insufficient documentation

## 2018-10-02 DIAGNOSIS — R0982 Postnasal drip: Secondary | ICD-10-CM | POA: Insufficient documentation

## 2018-10-02 MED ORDER — ALBUTEROL SULFATE (2.5 MG/3ML) 0.083% IN NEBU: 2.5000 mg | INHALATION_SOLUTION | Freq: Four times a day (QID) | RESPIRATORY_TRACT | 4 refills | Status: DC | PRN

## 2018-10-02 MED ORDER — METHYLPREDNISOLONE SODIUM SUCC 125 MG IJ SOLR
125.0000 mg | Freq: Once | INTRAMUSCULAR | Status: AC
  Administered 2018-10-02: 125 mg via INTRAMUSCULAR

## 2018-10-02 MED ORDER — ALBUTEROL SULFATE HFA 108 (90 BASE) MCG/ACT IN AERS: 2.0000 | INHALATION_SPRAY | Freq: Four times a day (QID) | RESPIRATORY_TRACT | 4 refills | Status: DC | PRN

## 2018-10-02 MED ORDER — PREDNISONE 20 MG PO TABS: ORAL_TABLET | ORAL | 0 refills | Status: DC

## 2018-10-02 MED ORDER — AZITHROMYCIN 250 MG PO TABS: ORAL_TABLET | ORAL | 0 refills | Status: DC

## 2018-10-02 MED FILL — ALBUTEROL SUL 2.5 MG/3 ML S: (2.5 MG/3ML | 6 days supply | Qty: 75 | Fill #0

## 2018-10-02 MED FILL — predniSONE 20 MG TABS: 20 | 5 days supply | Qty: 10 | Fill #0

## 2018-10-02 MED FILL — ALBUTEROL SULFATE HFA 108 (: 108 (90 BAS | 25 days supply | Qty: 18 | Fill #0

## 2018-10-02 MED FILL — AZITHROMYCIN 250 MG TABLET: 250 | 5 days supply | Qty: 6 | Fill #0

## 2018-10-02 NOTE — Progress Notes (Signed)
Subjective:    Patient ID: Hannah Baldwin, female    DOB: 10/10/1970, 46 y.o.   MRN: [id number]  HPI       47 year old female with a history of asthma who presents secondary to complaint of 2 weeks of increased shortness of breath, wheezing and chest tightness.  Patient has also had a cough productive of light green sputum.  Patient has also had nasal congestion with postnasal drainage.  Patient denies any sore throat or ear pain.  Patient states that she has almost run out of her inhalers due to increased use due to her shortness of breath.  Patient does not have any more albuterol nebulizer solution.  Patient denies any fever but patient has felt cold at times.  Patient also with increased fatigue. Past Medical History:  Diagnosis Date  . Anemia   . Asthma Dx 2014  . Diabetes mellitus without complication ([place])   . 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 Pneumonia ~ 2000 X 1   Past Surgical History:  Procedure Laterality Date  . CESAREAN SECTION  1992   Family History  Problem Relation Age of Onset  . Asthma Mother   . Asthma Daughter    Social History   Tobacco Use  . Smoking status: Former Smoker    Packs/day: 0.25    Years: 20.00    Pack years: 5.00    Types: Cigarettes    Last attempt to quit: 01/18/2015    Years since quitting: 3.7  . Smokeless tobacco: Never Used  Substance Use Topics  . Alcohol use: Yes    Comment: 01/20/2015 "dank some alcohol on my birthday, cookouts,  etc,;  maybe once/month"  . Drug use: No  No Known Allergies   Review of Systems  Constitutional: Positive for chills and fatigue. Negative for fever.  HENT: Positive for congestion, postnasal drip and rhinorrhea. Negative for ear pain, nosebleeds, sinus pressure, sinus pain, sneezing, sore throat and trouble swallowing.   Respiratory: Positive for cough, chest tightness, shortness of breath and wheezing.   Cardiovascular: Negative for chest pain, palpitations and leg  swelling.  Gastrointestinal: Negative for abdominal pain and nausea.  Musculoskeletal: Positive for back pain and myalgias (feels achy).  Neurological: Negative for dizziness and headaches.       Objective:   Physical Exam BP 127/76 (BP Location: Left Arm, Patient Position: Sitting, Cuff Size: Normal)   Pulse 95   Temp 98.6 F (37 C) (Oral)   Resp 18   Ht 5' 3.5" (1.613 m)   Wt (!) 330 lb (149.7 kg)   SpO2 95%   BMI 57.54 kg/m  Nurse's notes and vital signs reviewed General- morbidly obese female in no acute distress sitting in exam chair but patient with audible wheezing and appears to have a slight increase in work of breathing/respiratory rate ENT- TMs dull, nares with moderate edema of the nasal turbinates with clear to light yellow nasal discharge, patient with mild posterior pharynx erythema and patient with a narrowed posterior airway secondary to body habitus and large tongue base Neck- supple, no LAD Lungs- decreased air movement and wheezing in all lung fields; mild increase in respiratory rate, work of breathing but no accessory muscle use.  Patient received albuterol nebulizer treatment in the office with decreased audible wheezing as well as improved ease of breathing and patient with increased air movement and decreased wheeze on auscultation of the lungs.  No accessory muscle  use, breathing remains unlabored and patient with decrease in respiratory rate status post nebulizer treatment.  Patient with subjective improvement in shortness of breath and wheezing as well as decreased chest tightness status post albuterol nebulizer treatment. Cardiovascular-regular rate and rhythm Abdomen-truncal obesity, soft and nontender Back-no CVA tenderness Extremities- patient with mild, nonpitting distal lower extremity edema      Assessment & Plan:  1. Acute bronchitis with asthma with acute exacerbation Patient with complaint of 2 weeks of productive cough as well as increased  shortness of breath and wheezing.  Patient likely with acute bronchitis along with asthma exacerbation.  Patient given an injection of Solu-Medrol 125 mg IM x1 here in the office as well as albuterol nebulizer treatment and patient reported decrease shortness of breath, increased ease of breathing and decreased wheezing.  Patient also provided with prescription for refill of albuterol inhaler, prednisone x5 days and prescription for azithromycin Z-Pak.  If patient has acute worsening of her shortness of breath, patient should go to the emergency department for further evaluation and treatment otherwise follow-up next week if symptoms have not resolved.  Patient was encouraged to use her albuterol inhaler or nebulizer for more acute shortness of breath, every 4-6 hours for the next 72 hours and then may decrease to as needed use.  Patient should take an over-the-counter or prescription antihistamine to help with nasal congestion and postnasal drainage. - methylPREDNISolone sodium succinate (SOLU-MEDROL) 125 mg/2 mL injection 125 mg - albuterol (PROVENTIL) (2.5 MG/3ML) 0.083% nebulizer solution; Take 3 mLs (2.5 mg total) by nebulization every 6 (six) hours as needed for wheezing or shortness of breath.  Dispense: 75 mL; Refill: 4 - azithromycin (ZITHROMAX) 250 MG tablet; Take 2 pills today then 1 pill daily for 4 days  Dispense: 6 tablet; Refill: 0 - predniSONE (DELTASONE) 20 MG tablet; Take 2 pills once per day for 5 days; take after eating  Dispense: 10 tablet; Refill: 0  2. Moderate persistent asthma without complication Per her chart, patient with moderate persistent asthma and she is to continue the use of her Dulera and patient is provided with refill of albuterol. - albuterol (PROVENTIL HFA;VENTOLIN HFA) 108 (90 Base) MCG/ACT inhaler; Inhale 2 puffs into the lungs every 6 (six) hours as needed for wheezing or shortness of breath.  Dispense: 3 Inhaler; Refill: 4  An After Visit Summary was printed and  given to the patient.  Allergies as of 10/02/2018   No Known Allergies     Medication List       Accurate as of October 02, 2018  5:35 PM. Always use your most recent med list.        acetaminophen 325 MG tablet Commonly known as:  TYLENOL Take 2 tablets (650 mg total) by mouth every 8 (eight) hours.   albuterol (2.5 MG/3ML) 0.083% nebulizer solution Commonly known as:  PROVENTIL Take 3 mLs (2.5 mg total) by nebulization every 6 (six) hours as needed for wheezing or shortness of breath.   albuterol 108 (90 Base) MCG/ACT inhaler Commonly known as:  PROVENTIL HFA;VENTOLIN HFA Inhale 2 puffs into the lungs every 6 (six) hours as needed for wheezing or shortness of breath.   azithromycin 250 MG tablet Commonly known as:  ZITHROMAX Take 2 pills today then 1 pill daily for 4 days   blood glucose meter kit and supplies Kit Dispense based on patient and insurance preference. Use up to four times daily as directed. (FOR ICD-9 250.00, 250.01).   DULoxetine 60 MG capsule  Commonly known as:  CYMBALTA Take 1 capsule (60 mg total) by mouth daily.   ferrous sulfate 325 (65 FE) MG tablet Take 1 tablet (325 mg total) by mouth 2 (two) times daily with a meal.   gabapentin 300 MG capsule Commonly known as:  NEURONTIN Take 1 capsule (300 mg total) by mouth 2 (two) times daily.   glucose blood test strip Commonly known as:  TRUE METRIX BLOOD GLUCOSE TEST Use as instructed   megestrol 40 MG tablet Commonly known as:  MEGACE Take 1 tablet (40 mg total) by mouth 2 (two) times daily.   meloxicam 15 MG tablet Commonly known as:  MOBIC Take 1 tablet (15 mg total) by mouth daily.   metFORMIN 500 MG tablet Commonly known as:  GLUCOPHAGE Take 1 tablet (500 mg total) by mouth 2 (two) times daily.   mometasone-formoterol 100-5 MCG/ACT Aero Commonly known as:  DULERA Inhale 2 puffs into the lungs 2 (two) times daily.   nystatin cream Commonly known as:  MYCOSTATIN Apply to affected  area BID   predniSONE 20 MG tablet Commonly known as:  DELTASONE Take 2 pills once per day for 5 days; take after eating   traZODone 100 MG tablet Commonly known as:  DESYREL Take 1 tablet (100 mg total) by mouth at bedtime as needed for sleep.   TRUE METRIX METER w/Device Kit USE AS INSTRUCTED   TRUEPLUS LANCETS 28G Misc Check blood sugar TID & QHS       Return for ED if symptoms worsen otherwise keep f/u with PCP.

## 2018-11-03 MED FILL — traZODone HCL 100 MG TABS: 100 | 30 days supply | Qty: 30 | Fill #4

## 2018-11-04 ENCOUNTER — Ambulatory Visit: Payer: Self-pay | Admitting: Internal Medicine

## 2018-11-06 ENCOUNTER — Other Ambulatory Visit: Payer: Self-pay | Admitting: Internal Medicine

## 2018-11-06 DIAGNOSIS — L304 Erythema intertrigo: Secondary | ICD-10-CM

## 2018-11-06 MED FILL — ?METFORMIN HCL 500MG TABLET: 500 | 30 days supply | Qty: 60 | Fill #1

## 2018-11-06 MED FILL — GABAPENTIN 300 MG CAPSULE: 300 | 30 days supply | Qty: 60 | Fill #1

## 2018-11-06 MED FILL — MELOXICAM 15 MG TABLET: 15 | 30 days supply | Qty: 30 | Fill #4

## 2018-11-06 MED FILL — MEGESTROL 40 MG TABLET: 40 | 30 days supply | Qty: 60 | Fill #3

## 2018-11-06 MED FILL — ?DULoxetine HCL 60MG CPEP: 60 | 30 days supply | Qty: 30 | Fill #4

## 2018-11-24 ENCOUNTER — Ambulatory Visit: Payer: Self-pay | Admitting: Internal Medicine

## 2018-12-10 ENCOUNTER — Other Ambulatory Visit: Payer: Self-pay | Admitting: Obstetrics & Gynecology

## 2018-12-10 DIAGNOSIS — N938 Other specified abnormal uterine and vaginal bleeding: Secondary | ICD-10-CM

## 2018-12-10 MED FILL — DULoxetine HCL 60 MG CPEP: 60 | 30 days supply | Qty: 30 | Fill #5

## 2018-12-10 MED FILL — MELOXICAM 15 MG TABLET: 15 | 30 days supply | Qty: 30 | Fill #5

## 2018-12-10 MED FILL — MEGESTROL 40 MG TABLET: 40 | 30 days supply | Qty: 60 | Fill #0

## 2018-12-16 ENCOUNTER — Other Ambulatory Visit: Payer: Self-pay | Admitting: Internal Medicine

## 2018-12-16 DIAGNOSIS — G4700 Insomnia, unspecified: Secondary | ICD-10-CM

## 2018-12-31 MED FILL — traZODone HCL 100 MG TABS: 100 | 30 days supply | Qty: 30 | Fill #0

## 2019-01-15 MED FILL — DULoxetine HCL 60 MG CPEP: 60 | 30 days supply | Qty: 30 | Fill #6

## 2019-01-15 MED FILL — MELOXICAM 15 MG TABLET: 15 | 30 days supply | Qty: 30 | Fill #6

## 2019-01-17 MED FILL — MEGESTROL 40 MG TABLET: 40 | 30 days supply | Qty: 60 | Fill #1

## 2019-01-20 ENCOUNTER — Telehealth: Payer: Self-pay | Admitting: Internal Medicine

## 2019-01-20 MED ORDER — FLUCONAZOLE 150 MG PO TABS: 150.0000 mg | ORAL_TABLET | Freq: Once | ORAL | 1 refills | Status: AC

## 2019-01-20 MED FILL — ALBUTEROL SULFATE HFA 108 (: 108 (90 BAS | 25 days supply | Qty: 18 | Fill #1

## 2019-01-20 NOTE — Telephone Encounter (Signed)
1) Medication(s) Requested (by name):  fungus/itching pill Light blue inhaler States they dont know the name 2) Pharmacy of Choice: [facility] 3) Special Requests:   Approved medications will be sent to the pharmacy, we will reach out if there is an issue.  Requests made after 3pm may not be addressed until the following business day!  If a patient is unsure of the name of the medication(s) please note and ask patient to call back when they are able to provide all info, do not send to responsible party until all information is available!

## 2019-01-20 NOTE — Telephone Encounter (Signed)
Patient had refills on her inhaler at the pharmacy, I believe she is asking for a refill on Fluconazole for fungus/itching.

## 2019-01-21 MED FILL — FLUCONAZOLE 150 MG TABS: 150 | 1 days supply | Qty: 1 | Fill #0

## 2019-01-21 NOTE — Telephone Encounter (Signed)
Contacted pt and left a detailed vm and if she has any questions or concerns to give me a cal

## 2019-01-28 ENCOUNTER — Telehealth: Payer: Self-pay | Admitting: Internal Medicine

## 2019-01-28 NOTE — Telephone Encounter (Signed)
Pt stated having shortness of breath and wheezing aswell as difficulty swallowing please follow up

## 2019-01-28 NOTE — Telephone Encounter (Signed)
Patients call returned.  Patient identified by name and date of birth.  Patient states she is wheezing, has a cough, and it hurts to swallow.  Patient denies shortness of breath or fevers.  Patient has a nebulizer and a rescue inhaler but has had not had any relief from either.  Patient advised to go  To Urgent Care or the Emergency Department to be assessed.

## 2019-02-04 MED FILL — ?METFORMIN HCL 500MG TABLET: 500 | 30 days supply | Qty: 60 | Fill #2

## 2019-02-10 MED FILL — traZODone HCL 100 MG TABS: 100 | 30 days supply | Qty: 30 | Fill #1

## 2019-02-19 ENCOUNTER — Other Ambulatory Visit: Payer: Self-pay | Admitting: Internal Medicine

## 2019-02-19 DIAGNOSIS — M1712 Unilateral primary osteoarthritis, left knee: Secondary | ICD-10-CM

## 2019-02-20 MED FILL — MELOXICAM 15 MG TABLET: 15 | 30 days supply | Qty: 30 | Fill #0

## 2019-02-20 MED FILL — ?DULoxetine HCL 60 MG CPEP: 60 | 30 days supply | Qty: 30 | Fill #0

## 2019-02-24 MED FILL — MEGESTROL 40 MG TABLET: 40 | 30 days supply | Qty: 60 | Fill #2

## 2019-03-12 ENCOUNTER — Ambulatory Visit: Payer: Self-pay | Attending: Internal Medicine | Admitting: Internal Medicine

## 2019-03-12 ENCOUNTER — Other Ambulatory Visit: Payer: Self-pay

## 2019-03-12 ENCOUNTER — Encounter: Payer: Self-pay | Admitting: Internal Medicine

## 2019-03-12 VITALS — BP 165/85 | HR 63 | Temp 98.1°F | Resp 16 | Wt 335.2 lb

## 2019-03-12 DIAGNOSIS — N938 Other specified abnormal uterine and vaginal bleeding: Secondary | ICD-10-CM

## 2019-03-12 DIAGNOSIS — Z1239 Encounter for other screening for malignant neoplasm of breast: Secondary | ICD-10-CM

## 2019-03-12 DIAGNOSIS — E1140 Type 2 diabetes mellitus with diabetic neuropathy, unspecified: Secondary | ICD-10-CM

## 2019-03-12 DIAGNOSIS — Z124 Encounter for screening for malignant neoplasm of cervix: Secondary | ICD-10-CM

## 2019-03-12 DIAGNOSIS — G4700 Insomnia, unspecified: Secondary | ICD-10-CM

## 2019-03-12 DIAGNOSIS — L304 Erythema intertrigo: Secondary | ICD-10-CM

## 2019-03-12 DIAGNOSIS — M1712 Unilateral primary osteoarthritis, left knee: Secondary | ICD-10-CM

## 2019-03-12 DIAGNOSIS — J4540 Moderate persistent asthma, uncomplicated: Secondary | ICD-10-CM

## 2019-03-12 DIAGNOSIS — R300 Dysuria: Secondary | ICD-10-CM

## 2019-03-12 DIAGNOSIS — D509 Iron deficiency anemia, unspecified: Secondary | ICD-10-CM

## 2019-03-12 DIAGNOSIS — I10 Essential (primary) hypertension: Secondary | ICD-10-CM

## 2019-03-12 LAB — POCT URINALYSIS DIP (CLINITEK)
Glucose, UA: NEGATIVE mg/dL
Ketones, POC UA: NEGATIVE mg/dL
Leukocytes, UA: NEGATIVE
Nitrite, UA: NEGATIVE
Spec Grav, UA: 1.025 (ref 1.010–1.025)
Urobilinogen, UA: 1 E.U./dL
pH, UA: 6.5 (ref 5.0–8.0)

## 2019-03-12 MED ORDER — ALBUTEROL SULFATE HFA 108 (90 BASE) MCG/ACT IN AERS: 2.0000 | INHALATION_SPRAY | Freq: Four times a day (QID) | RESPIRATORY_TRACT | 12 refills | Status: DC | PRN

## 2019-03-12 MED ORDER — TRAZODONE HCL 100 MG PO TABS: 100.0000 mg | ORAL_TABLET | Freq: Every evening | ORAL | 6 refills | Status: DC | PRN

## 2019-03-12 MED ORDER — METFORMIN HCL 500 MG PO TABS: 500.0000 mg | ORAL_TABLET | Freq: Two times a day (BID) | ORAL | 5 refills | Status: DC

## 2019-03-12 MED ORDER — MELOXICAM 15 MG PO TABS: 15.0000 mg | ORAL_TABLET | Freq: Every day | ORAL | 6 refills | Status: DC

## 2019-03-12 MED ORDER — MOMETASONE FURO-FORMOTEROL FUM 100-5 MCG/ACT IN AERO: 2.0000 | INHALATION_SPRAY | Freq: Two times a day (BID) | RESPIRATORY_TRACT | 12 refills | Status: DC

## 2019-03-12 MED ORDER — ALBUTEROL SULFATE (2.5 MG/3ML) 0.083% IN NEBU: 2.5000 mg | INHALATION_SOLUTION | Freq: Four times a day (QID) | RESPIRATORY_TRACT | 12 refills | Status: DC | PRN

## 2019-03-12 MED ORDER — GABAPENTIN 300 MG PO CAPS: 300.0000 mg | ORAL_CAPSULE | Freq: Two times a day (BID) | ORAL | 6 refills | Status: DC

## 2019-03-12 MED ORDER — MEGESTROL ACETATE 40 MG PO TABS: 40.0000 mg | ORAL_TABLET | Freq: Two times a day (BID) | ORAL | 1 refills | Status: DC

## 2019-03-12 MED ORDER — FERROUS SULFATE 325 (65 FE) MG PO TABS: 325.0000 mg | ORAL_TABLET | Freq: Two times a day (BID) | ORAL | 2 refills | Status: DC

## 2019-03-12 MED ORDER — LISINOPRIL 10 MG PO TABS: 10.0000 mg | ORAL_TABLET | Freq: Every day | ORAL | 3 refills | Status: DC

## 2019-03-12 MED ORDER — FLUCONAZOLE 150 MG PO TABS: 150.0000 mg | ORAL_TABLET | ORAL | 0 refills | Status: DC

## 2019-03-12 MED ORDER — DULOXETINE HCL 60 MG PO CPEP: 60.0000 mg | ORAL_CAPSULE | Freq: Every day | ORAL | 6 refills | Status: DC

## 2019-03-12 MED FILL — FLUCONAZOLE 150 MG TABS: 150 | 21 days supply | Qty: 3 | Fill #0

## 2019-03-12 MED FILL — ?METFORMIN HCL 500MG TABLET: 500 | 30 days supply | Qty: 60 | Fill #0

## 2019-03-12 MED FILL — !PROVENTIL HFA 90 MCG INH: 108 (90 BAS | 25 days supply | Qty: 1 | Fill #0

## 2019-03-12 MED FILL — !DULERA 100 MCG/5 MCG INH: 100-5 | 30 days supply | Qty: 13 | Fill #0

## 2019-03-12 MED FILL — traZODone HCL 100 MG TABS: 100 | 30 days supply | Qty: 30 | Fill #0

## 2019-03-12 MED FILL — FERROUS SULFATE 325 MG TAB: 325 (65 FE) | 90 days supply | Qty: 180 | Fill #0

## 2019-03-12 MED FILL — ALBUTEROL SUL 2.5 MG/3 ML S: (2.5 MG/3ML | 6 days supply | Qty: 75 | Fill #0

## 2019-03-12 MED FILL — LISINOPRIL 10 MG TABS: 10 | 30 days supply | Qty: 30 | Fill #0

## 2019-03-12 MED FILL — GABAPENTIN 300 MG CAPSULE: 300 | 30 days supply | Qty: 60 | Fill #0

## 2019-03-12 NOTE — Progress Notes (Signed)
Pt states once she urinate there is an odor and she has been having it for a while  Pt denies discharge  Pt states her TraZODone needs to be increased  [id number] a1c- 6.0

## 2019-03-12 NOTE — Progress Notes (Signed)
Patient ID: Hannah Baldwin, female    DOB: [birth year]  MRN: [id number]  CC: Urinary Tract Infection and Diabetes   Subjective: Hannah Baldwin is a 48 y.o. 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   Of note, EMR was down at the time of her visit today  Pt c/o strong odor to urine x several days.  No dysuria.  Does not drink enough water during the day.  Request another course of Diflucan for rash b/w the skin folds on thigh.  Antifungal creams do not work  Would like to have pap done today. One abn pap in past that did not require any invasive procedure.  -no vaginal dischg or itching -recently sexually active and did not use condoms.  Request STD screen. -still on Megace.  No menses on this med.  Will have spotting for a few days if she misses 1-2 doses.   -not taking iron.  She hopes she is no longer anemic she she really does not have menses.  Endorses fatigue  DM:  Reports compliance with and tolerating Metformin Trying to eat better Stays active by working  Asthma:  No recent flares doing well on Dulera Does not smoke  Insomnia:  Does okay with Trazodone but feels she needs higher dose   Patient Active Problem List   Diagnosis Date Noted  . Fibroid uterus 01/09/2018  . Primary osteoarthritis of left knee 04/09/2017  . Insomnia 11/03/2015  . Onychomycosis of toenail 07/25/2015  . Diabetic neuropathy ([place]) 07/25/2015  . DUB (dysfunctional uterine bleeding) 02/03/2015  . Diabetes type 2, controlled ([place]) 01/25/2015  . Severe obesity (BMI >= 40) ([place]) 01/25/2015  . Moderate persistent asthma 01/21/2015  . COPD (chronic obstructive pulmonary disease) ([place]) 01/20/2015  . Cardiomegaly 06/09/2014  . Hypochromic microcytic anemia 06/08/2014  .  Former smoker 01/01/2013     Current Outpatient Medications on File Prior to Visit  Medication Sig Dispense Refill  . acetaminophen (TYLENOL) 325 MG tablet Take 2 tablets (650 mg total) by mouth every 8 (eight) hours. 60 tablet 4  . blood glucose meter kit and supplies KIT Dispense based on patient and insurance preference. Use up to four times daily as directed. (FOR ICD-9 250.00, 250.01). 1 each 0  . Blood Glucose Monitoring Suppl (TRUE METRIX METER) w/Device KIT USE AS INSTRUCTED 1 kit 0  . glucose blood (TRUE METRIX BLOOD GLUCOSE TEST) test strip Use as instructed 100 each 12  . nystatin cream (MYCOSTATIN) Apply to affected area BID 30 g 0  . TRUEPLUS LANCETS 28G MISC Check blood sugar TID & QHS 100 each 2   No current facility-administered medications on file prior to visit.     No Known Allergies  Social History   Socioeconomic History  . Marital status: Widowed    Spouse name: Not on file  . Number of children: Not on file  . Years of education: Not on file  . Highest education level: Not on file  Occupational History  . Not on file  Social Needs  . Financial resource strain: Not on file  . Food insecurity:    Worry: Not on file    Inability: Not on file  . Transportation needs:    Medical: Not on file    Non-medical: Not on file  Tobacco Use  . Smoking  status: Former Smoker    Packs/day: 0.25    Years: 20.00    Pack years: 5.00    Types: Cigarettes    Last attempt to quit: 01/18/2015    Years since quitting: 4.1  . Smokeless tobacco: Never Used  Substance and Sexual Activity  . Alcohol use: Yes    Comment: 01/20/2015 "dank some alcohol on my birthday, cookouts,  etc,;  maybe once/month"  . Drug use: No  . Sexual activity: Not Currently    Birth control/protection: None  Lifestyle  . Physical activity:    Days per week: Not on file    Minutes per session: Not on file  . Stress: Not on file  Relationships  . Social connections:    Talks on phone: Not on  file    Gets together: Not on file    Attends religious service: Not on file    Active member of club or organization: Not on file    Attends meetings of clubs or organizations: Not on file    Relationship status: Not on file  . Intimate partner violence:    Fear of current or ex partner: Not on file    Emotionally abused: Not on file    Physically abused: Not on file    Forced sexual activity: Not on file  Other Topics Concern  . Not on file  Social History Narrative  . Not on file    Family History  Problem Relation Age of Onset  . Asthma Mother   . Asthma Daughter     Past Surgical History:  Procedure Laterality Date  . CESAREAN SECTION  1992    ROS: Review of Systems Negative except as stated above  PHYSICAL EXAM: BP (!) 165/85   Pulse 63   Temp 98.1 F (36.7 C) (Oral)   Resp 16   Wt (!) 335 lb 3.2 oz (152 kg)   SpO2 100%   BMI 58.45 kg/m   Wt Readings from Last 3 Encounters:  03/12/19 (!) 335 lb 3.2 oz (152 kg)  10/02/18 (!) 330 lb (149.7 kg)  08/04/18 (!) 333 lb 9.6 oz (151.3 kg)  repeat BP 148/85  Physical Exam  General appearance - alert, well appearing, morbidly obese AAF and in no distress Mental status - alert, oriented to person, place, and time Mouth - mucous membranes moist, pharynx normal without lesions Neck - supple, no significant adenopathy Chest - clear to auscultation, no wheezes, rales or rhonchi, symmetric air entry Heart - normal rate, regular rhythm, normal S1, S2, no murmurs, rubs, clicks or gallops Pelvic - normal external genitalia, vulva, vagina, cervix, uterus and adnexa Extremities - peripheral pulses normal, no pedal edema, no clubbing or cyanosis Skin - hyperpigmented scaly rash b/w skin folds on both thigh  Results for orders placed or performed in visit on 03/12/19  POCT URINALYSIS DIP (CLINITEK)  Result Value Ref Range   Color, UA yellow yellow   Clarity, UA cloudy (A) clear   Glucose, UA negative negative mg/dL    Bilirubin, UA small (A) negative   Ketones, POC UA negative negative mg/dL   Spec Grav, UA 1.025 1.010 - 1.025   Blood, UA small (A) negative   pH, UA 6.5 5.0 - 8.0   POC PROTEIN,UA trace negative, trace   Urobilinogen, UA 1.0 0.2 or 1.0 E.U./dL   Nitrite, UA Negative Negative   Leukocytes, UA Negative Negative     CMP Latest Ref Rng & Units 11/18/2017 03/08/2017 12/20/2016  Glucose 65 -  99 mg/dL 76 85 68  BUN 6 - 24 mg/dL '7 12 10  ' Creatinine 0.57 - 1.00 mg/dL 0.87 0.84 0.85  Sodium 134 - 144 mmol/L 142 144 140  Potassium 3.5 - 5.2 mmol/L 4.8 4.5 4.5  Chloride 96 - 106 mmol/L 103 106 106  CO2 20 - 29 mmol/L '26 25 26  ' Calcium 8.7 - 10.2 mg/dL 9.2 9.0 9.3  Total Protein 6.0 - 8.5 g/dL 6.6 - 6.6  Total Bilirubin 0.0 - 1.2 mg/dL 0.2 - 0.3  Alkaline Phos 39 - 117 IU/L 95 - 83  AST 0 - 40 IU/L 10 - 13  ALT 0 - 32 IU/L 12 - 15   Lipid Panel     Component Value Date/Time   CHOL 127 01/20/2015 2241   TRIG 75 01/20/2015 2241   HDL 37 (L) 01/20/2015 2241   CHOLHDL 3.4 01/20/2015 2241   VLDL 15 01/20/2015 2241   LDLCALC 75 01/20/2015 2241    CBC    Component Value Date/Time   WBC 6.5 11/18/2017 1144   WBC 8.6 12/20/2016 1048   RBC 4.32 11/18/2017 1144   RBC 4.48 12/20/2016 1048   HGB 10.7 (L) 11/18/2017 1144   HCT 34.3 11/18/2017 1144   PLT 462 (H) 11/18/2017 1144   MCV 79 11/18/2017 1144   MCH 24.8 (L) 11/18/2017 1144   MCH 22.8 (L) 12/20/2016 1048   MCHC 31.2 (L) 11/18/2017 1144   MCHC 29.8 (L) 12/20/2016 1048   RDW 16.9 (H) 11/18/2017 1144   LYMPHSABS 2.4 03/08/2017 1101   MONOABS 516 12/20/2016 1048   EOSABS 0.2 03/08/2017 1101   BASOSABS 0.0 03/08/2017 1101    ASSESSMENT AND PLAN:  1. Type 2 diabetes, controlled, with neuropathy ([place]) Controlled Encourage healthy eating habits. Encourage her to try to walk several days a wk for exercise - gabapentin (NEURONTIN) 300 MG capsule; Take 1 capsule (300 mg total) by mouth 2 (two) times daily.  Dispense: 60 capsule;  Refill: 6 - metFORMIN (GLUCOPHAGE) 500 MG tablet; Take 1 tablet (500 mg total) by mouth 2 (two) times daily.  Dispense: 60 tablet; Refill: 5 - POCT glucose (manual entry) - POCT glycosylated hemoglobin (Hb A1C) - Microalbumin / creatinine urine ratio - CBC; Future - Comprehensive metabolic panel; Future - Lipid panel; Future  2. Essential hypertension -BP has fluctuated on previous visits Will start Lisinopril DASH diet discussed and encouraged - lisinopril (ZESTRIL) 10 MG tablet; Take 1 tablet (10 mg total) by mouth daily.  Dispense: 30 tablet; Refill: 3  3. Dysuria UA neg for UTI - POCT URINALYSIS DIP (CLINITEK)  4. Primary osteoarthritis of left knee - DULoxetine (CYMBALTA) 60 MG capsule; Take 1 capsule (60 mg total) by mouth daily.  Dispense: 30 capsule; Refill: 6 - meloxicam (MOBIC) 15 MG tablet; Take 1 tablet (15 mg total) by mouth daily.  Dispense: 30 tablet; Refill: 6  5. Moderate persistent asthma without complication Doing well on Dulera - mometasone-formoterol (DULERA) 100-5 MCG/ACT AERO; Inhale 2 puffs into the lungs 2 (two) times daily.  Dispense: 13 g; Refill: 12 - albuterol (VENTOLIN HFA) 108 (90 Base) MCG/ACT inhaler; Inhale 2 puffs into the lungs every 6 (six) hours as needed for wheezing or shortness of breath.  Dispense: 3 Inhaler; Refill: 12 - albuterol (PROVENTIL) (2.5 MG/3ML) 0.083% nebulizer solution; Take 3 mLs (2.5 mg total) by nebulization every 6 (six) hours as needed for wheezing or shortness of breath.  Dispense: 75 mL; Refill: 12  6. Intertrigo - fluconazole (DIFLUCAN) 150  MG tablet; Take 1 tablet (150 mg total) by mouth once a week.  Dispense: 3 tablet; Refill: 0  7. Iron deficiency anemia, unspecified iron deficiency anemia type She has pale conjunctiva on exam suggesting that she is still anemic.  Recommend that she restarts iron supplement.  Check CBC - ferrous sulfate 325 (65 FE) MG tablet; Take 1 tablet (325 mg total) by mouth 2 (two) times daily  with a meal.  Dispense: 120 tablet; Refill: 2  8. DUB (dysfunctional uterine bleeding) - megestrol (MEGACE) 40 MG tablet; Take 1 tablet (40 mg total) by mouth 2 (two) times daily.  Dispense: 120 tablet; Refill: 1  9. Insomnia, unspecified type Will keep her at 100 mg Trazodone.  Risk of serotonin syndrome in combo with Cymbalta at higher doses - traZODone (DESYREL) 100 MG tablet; Take 1 tablet (100 mg total) by mouth at bedtime as needed for sleep.  Dispense: 30 tablet; Refill: 6  10. Pap smear for cervical cancer screening - Cytology - PAP  11. Breast cancer screening - MM Digital Screening; Future    Patient was given the opportunity to ask questions.  Patient verbalized understanding of the plan and was able to repeat key elements of the plan.   Orders Placed This Encounter  Procedures  . MM Digital Screening  . Microalbumin / creatinine urine ratio  . CBC  . Comprehensive metabolic panel  . Lipid panel  . POCT URINALYSIS DIP (CLINITEK)  . POCT glucose (manual entry)  . POCT glycosylated hemoglobin (Hb A1C)     Requested Prescriptions   Signed Prescriptions Disp Refills  . DULoxetine (CYMBALTA) 60 MG capsule 30 capsule 6    Sig: Take 1 capsule (60 mg total) by mouth daily.  Marland Kitchen gabapentin (NEURONTIN) 300 MG capsule 60 capsule 6    Sig: Take 1 capsule (300 mg total) by mouth 2 (two) times daily.  . meloxicam (MOBIC) 15 MG tablet 30 tablet 6    Sig: Take 1 tablet (15 mg total) by mouth daily.  . metFORMIN (GLUCOPHAGE) 500 MG tablet 60 tablet 5    Sig: Take 1 tablet (500 mg total) by mouth 2 (two) times daily.  . mometasone-formoterol (DULERA) 100-5 MCG/ACT AERO 13 g 12    Sig: Inhale 2 puffs into the lungs 2 (two) times daily.  Marland Kitchen albuterol (VENTOLIN HFA) 108 (90 Base) MCG/ACT inhaler 3 Inhaler 12    Sig: Inhale 2 puffs into the lungs every 6 (six) hours as needed for wheezing or shortness of breath.  Marland Kitchen albuterol (PROVENTIL) (2.5 MG/3ML) 0.083% nebulizer solution 75 mL 12     Sig: Take 3 mLs (2.5 mg total) by nebulization every 6 (six) hours as needed for wheezing or shortness of breath.  . ferrous sulfate 325 (65 FE) MG tablet 120 tablet 2    Sig: Take 1 tablet (325 mg total) by mouth 2 (two) times daily with a meal.  . megestrol (MEGACE) 40 MG tablet 120 tablet 1    Sig: Take 1 tablet (40 mg total) by mouth 2 (two) times daily.  . traZODone (DESYREL) 100 MG tablet 30 tablet 6    Sig: Take 1 tablet (100 mg total) by mouth at bedtime as needed for sleep.  . fluconazole (DIFLUCAN) 150 MG tablet 3 tablet 0    Sig: Take 1 tablet (150 mg total) by mouth once a week.  Marland Kitchen lisinopril (ZESTRIL) 10 MG tablet 30 tablet 3    Sig: Take 1 tablet (10 mg total) by mouth  daily.    Return in about 3 months (around 06/12/2019).  Karle Plumber, MD, FACP

## 2019-03-13 LAB — POCT GLYCOSYLATED HEMOGLOBIN (HGB A1C): Hemoglobin A1C: 6 % — AB (ref 4.0–5.6)

## 2019-03-13 LAB — GLUCOSE, POCT (MANUAL RESULT ENTRY): POC Glucose: 81 mg/dl (ref 70–99)

## 2019-03-16 LAB — CYTOLOGY - PAP
Chlamydia: NEGATIVE
Diagnosis: NEGATIVE
HPV: NOT DETECTED
Neisseria Gonorrhea: NEGATIVE
Trichomonas: NEGATIVE

## 2019-03-17 ENCOUNTER — Telehealth: Payer: Self-pay

## 2019-03-17 DIAGNOSIS — N898 Other specified noninflammatory disorders of vagina: Secondary | ICD-10-CM

## 2019-03-17 NOTE — Telephone Encounter (Signed)
Contacted pt and made her aware. Pt will be coming by tomorrow

## 2019-03-17 NOTE — Telephone Encounter (Signed)
Pt is wanting to know is it because she drinks a lot of coffee and sodas. Pt states she doesn't drink a lot of water. Pt states she doesn't have any discharge.

## 2019-03-17 NOTE — Telephone Encounter (Signed)
Contacted pt to go over pap results pt is aware   Dr. Wynetta Emery pt states she is still having an odor and she doesn't know what to do

## 2019-03-18 ENCOUNTER — Other Ambulatory Visit: Payer: Self-pay

## 2019-03-18 ENCOUNTER — Ambulatory Visit: Payer: Self-pay | Attending: Family Medicine

## 2019-03-18 ENCOUNTER — Telehealth: Payer: Self-pay | Admitting: Internal Medicine

## 2019-03-18 DIAGNOSIS — N898 Other specified noninflammatory disorders of vagina: Secondary | ICD-10-CM

## 2019-03-18 DIAGNOSIS — E1140 Type 2 diabetes mellitus with diabetic neuropathy, unspecified: Secondary | ICD-10-CM

## 2019-03-19 LAB — CBC
Hematocrit: 38.4 % (ref 34.0–46.6)
Hemoglobin: 11.5 g/dL (ref 11.1–15.9)
MCH: 25.1 pg — ABNORMAL LOW (ref 26.6–33.0)
MCHC: 29.9 g/dL — ABNORMAL LOW (ref 31.5–35.7)
MCV: 84 fL (ref 79–97)
Platelets: 430 10*3/uL (ref 150–450)
RBC: 4.59 x10E6/uL (ref 3.77–5.28)
RDW: 15.8 % — ABNORMAL HIGH (ref 11.7–15.4)
WBC: 6.9 10*3/uL (ref 3.4–10.8)

## 2019-03-19 LAB — COMPREHENSIVE METABOLIC PANEL
ALT: 20 IU/L (ref 0–32)
AST: 16 IU/L (ref 0–40)
Albumin/Globulin Ratio: 1.7 (ref 1.2–2.2)
Albumin: 4.3 g/dL (ref 3.8–4.8)
Alkaline Phosphatase: 91 IU/L (ref 39–117)
BUN/Creatinine Ratio: 13 (ref 9–23)
BUN: 11 mg/dL (ref 6–24)
Bilirubin Total: <0.2 mg/dL (ref 0.0–1.2)
CO2: 24 mmol/L (ref 20–29)
Calcium: 9.5 mg/dL (ref 8.7–10.2)
Chloride: 105 mmol/L (ref 96–106)
Creatinine, Ser: 0.87 mg/dL (ref 0.57–1.00)
GFR calc Af Amer: 92 mL/min/{1.73_m2} (ref >59)
GFR calc non Af Amer: 80 mL/min/{1.73_m2} (ref >59)
Globulin, Total: 2.6 g/dL (ref 1.5–4.5)
Glucose: 82 mg/dL (ref 65–99)
Potassium: 4 mmol/L (ref 3.5–5.2)
Sodium: 143 mmol/L (ref 134–144)
Total Protein: 6.9 g/dL (ref 6.0–8.5)

## 2019-03-19 LAB — LIPID PANEL
Chol/HDL Ratio: 3.3 ratio (ref 0.0–4.4)
Cholesterol, Total: 132 mg/dL (ref 100–199)
HDL: 40 mg/dL (ref >39)
LDL Calculated: 79 mg/dL (ref 0–99)
Triglycerides: 67 mg/dL (ref 0–149)
VLDL Cholesterol Cal: 13 mg/dL (ref 5–40)

## 2019-03-20 LAB — URINE CYTOLOGY ANCILLARY ONLY: Candida vaginitis: NEGATIVE

## 2019-03-21 ENCOUNTER — Other Ambulatory Visit: Payer: Self-pay | Admitting: Internal Medicine

## 2019-03-21 DIAGNOSIS — N898 Other specified noninflammatory disorders of vagina: Secondary | ICD-10-CM

## 2019-03-21 MED ORDER — METRONIDAZOLE 500 MG PO TABS: 500.0000 mg | ORAL_TABLET | Freq: Two times a day (BID) | ORAL | 0 refills | Status: DC

## 2019-03-21 NOTE — Progress Notes (Signed)
flagyl

## 2019-03-23 ENCOUNTER — Telehealth: Payer: Self-pay

## 2019-03-23 MED FILL — metroNIDAZOLE 500 MG TABS: 500 | 7 days supply | Qty: 14 | Fill #0

## 2019-03-23 NOTE — Telephone Encounter (Signed)
Contacted pt on Friday 6/12 to go over lab results pt didn't answer left a detailed vm informing pt of results and if she has any questions or concerns to give me a call

## 2019-03-23 NOTE — Telephone Encounter (Signed)
Contacted pt to go over urine results pt is aware and doesn't have any questions or concerns

## 2019-03-25 MED FILL — MELOXICAM 15 MG TABLET: 15 | 30 days supply | Qty: 30 | Fill #0

## 2019-03-25 MED FILL — ?DULoxetine HCL 60 MG CPEP: 60 | 30 days supply | Qty: 30 | Fill #0

## 2019-03-25 MED FILL — traZODone HCL 100 MG TABS: 100 | 30 days supply | Qty: 30 | Fill #2

## 2019-03-30 MED FILL — FLUCONAZOLE 150 MG TABS: 150 | 21 days supply | Qty: 3 | Fill #0

## 2019-04-02 MED FILL — MEGESTROL 40 MG TABLET: 40 | 30 days supply | Qty: 60 | Fill #3

## 2019-04-02 MED FILL — ?METFORMIN HCL 500MG TABLET: 500 | 30 days supply | Qty: 60 | Fill #3

## 2019-04-06 MED FILL — FLUCONAZOLE 150 MG TABS: 150 | 1 days supply | Qty: 1 | Fill #1

## 2019-04-24 MED FILL — ?METFORMIN HCL 500MG TABLET: 500 | 30 days supply | Qty: 60 | Fill #0

## 2019-04-24 MED FILL — LISINOPRIL 10 MG TABS: 10 | 30 days supply | Qty: 30 | Fill #0

## 2019-04-24 MED FILL — MEGESTROL 40 MG TABLET: 40 | 30 days supply | Qty: 60 | Fill #0

## 2019-04-27 MED FILL — MELOXICAM 15 MG TABLET: 15 | 30 days supply | Qty: 30 | Fill #1

## 2019-04-27 MED FILL — ?DULoxetine HCL 60 MG CPEP: 60 | 30 days supply | Qty: 30 | Fill #1

## 2019-06-02 MED FILL — traZODone HCL 100 MG TABS: 100 | 30 days supply | Qty: 30 | Fill #0

## 2019-06-08 MED FILL — FLUCONAZOLE 150 MG TABLET: 150 | 1 days supply | Qty: 1 | Fill #1

## 2019-06-08 MED FILL — ?DULoxetine HCL 60 MG CPEP: 60 | 30 days supply | Qty: 30 | Fill #2

## 2019-06-08 MED FILL — MELOXICAM 15 MG TABLET: 15 | 30 days supply | Qty: 30 | Fill #2

## 2019-06-10 MED FILL — traZODone HCL 100 MG TABS: 100 | 30 days supply | Qty: 30 | Fill #0

## 2019-06-29 MED FILL — traZODone HCL 100 MG TABS: 100 | 30 days supply | Qty: 30 | Fill #0

## 2019-06-29 MED FILL — ?METFORMIN HCL 500MG TABLET: 500 | 30 days supply | Qty: 60 | Fill #1

## 2019-06-29 MED FILL — LISINOPRIL 10 MG TABS: 10 | 30 days supply | Qty: 30 | Fill #1

## 2019-06-29 MED FILL — !PROVENTIL HFA 90 MCG INH: 108 (90 BAS | 25 days supply | Qty: 1 | Fill #0

## 2019-06-29 MED FILL — MEGESTROL 40 MG TABLET: 40 | 30 days supply | Qty: 60 | Fill #1

## 2019-07-10 MED FILL — MELOXICAM 15 MG TABLET: 15 | 30 days supply | Qty: 30 | Fill #3

## 2019-07-10 MED FILL — ?DULOXETINE HCL 60 MG CPEP: 60 | 30 days supply | Qty: 30 | Fill #3

## 2019-07-13 MED FILL — !PROVENTIL HFA 90 MCG INH: 108 (90 BAS | 25 days supply | Qty: 1 | Fill #1

## 2019-07-24 ENCOUNTER — Encounter: Payer: Self-pay | Admitting: Internal Medicine

## 2019-07-24 ENCOUNTER — Other Ambulatory Visit: Payer: Self-pay

## 2019-07-24 ENCOUNTER — Ambulatory Visit: Payer: Self-pay | Attending: Internal Medicine | Admitting: Internal Medicine

## 2019-07-24 VITALS — BP 117/83 | HR 75 | Temp 98.4°F | Resp 16 | Wt 340.8 lb

## 2019-07-24 DIAGNOSIS — E1140 Type 2 diabetes mellitus with diabetic neuropathy, unspecified: Secondary | ICD-10-CM

## 2019-07-24 DIAGNOSIS — M1712 Unilateral primary osteoarthritis, left knee: Secondary | ICD-10-CM

## 2019-07-24 DIAGNOSIS — Z2821 Immunization not carried out because of patient refusal: Secondary | ICD-10-CM

## 2019-07-24 DIAGNOSIS — I10 Essential (primary) hypertension: Secondary | ICD-10-CM

## 2019-07-24 DIAGNOSIS — J4540 Moderate persistent asthma, uncomplicated: Secondary | ICD-10-CM

## 2019-07-24 DIAGNOSIS — L304 Erythema intertrigo: Secondary | ICD-10-CM

## 2019-07-24 DIAGNOSIS — R0609 Other forms of dyspnea: Secondary | ICD-10-CM

## 2019-07-24 DIAGNOSIS — B9689 Other specified bacterial agents as the cause of diseases classified elsewhere: Secondary | ICD-10-CM

## 2019-07-24 LAB — GLUCOSE, POCT (MANUAL RESULT ENTRY): POC Glucose: 113 mg/dl — AB (ref 70–99)

## 2019-07-24 MED ORDER — METRONIDAZOLE 500 MG PO TABS: 500.0000 mg | ORAL_TABLET | Freq: Two times a day (BID) | ORAL | 0 refills | Status: DC

## 2019-07-24 MED ORDER — NYSTATIN-TRIAMCINOLONE 100000-0.1 UNIT/GM-% EX CREA: 1.0000 "application " | TOPICAL_CREAM | Freq: Two times a day (BID) | CUTANEOUS | 0 refills | Status: DC

## 2019-07-24 MED FILL — ?METRONIDAZOLE 500 MG TABS: 500 | 7 days supply | Qty: 14 | Fill #0

## 2019-07-24 MED FILL — NYSTATIN-TRIAMCINOLONE CRM: 100000-0.1 | 30 days supply | Qty: 30 | Fill #0

## 2019-07-24 NOTE — Patient Instructions (Signed)

## 2019-07-24 NOTE — Progress Notes (Signed)
Patient ID: Hannah Baldwin, female    DOB: 1971-06-29  MRN: [id number]  CC: Diabetes   Subjective: Hannah Baldwin is a 48 y.o. 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   Had pap on last exam. Tested positive for BV.  Got confused and did not take the Flagyl.  Had an old pill of Zithromax and was not sure what she was suppose to take.  Still has vaginal order.  She is not sexually active.  She would like to have Flagyl prescription resent to the pharmacy.  Gets SOB "just doing small stuff like making up the bed" x 1 mth.  Of note, DOE has been a complaint in the past. Not sure if blood count is low.  She is not taking the iron supplement.  On last visit her hemoglobin was better so I had recommend taking the iron every other day.  She has not had any periods.  She is on Megace. -No SOB at rest or when laying down.  No chest pains. No LE edema Over all feels her asthma is okay.  Using Dulera BID.  Some days she does not have to use the Albuterol  Still has a lot of itching upper inner thigh with chafing and rash in the skin fold of the right upper thigh.  Would like Diflucan pill again or some cream.  She does not feel nystatin cream takes away the itching.    LT knee gives out at times.  No falls Plans to try to sign up for insurance through an exchange.  Also wonders about applying for medicaid and would like for me to write a letter in support of her getting it.  DIABETES TYPE 2 Last A1C:   Results for orders placed or performed in visit on 07/24/19  POCT glucose (manual entry)  Result Value Ref Range   POC Glucose 113 (A) 70 - 99 mg/dl    Med Adherence:  _0  Yes but admits that she sometimes forget to take it BID   _1  No Medication side effects:  _2  Yes     _3  No Home Monitoring?  _4  Yes BID    _5  No Home glucose results range:150 Diet Adherence: "I'm addicted to sodas." Exercise: goes to work every day Hypoglycemic episodes?: _6  Yes    _7  No Numbness of the feet? _8  Yes    _9  No Retinopathy hx? _10  Yes    _11  No Last eye exam: Overdue for eye exam.  However she lacks insurance. Comments:  Patient Active Problem List   Diagnosis Date Noted   Fibroid uterus 01/09/2018   Primary osteoarthritis of left knee 04/09/2017   Insomnia 11/03/2015   Onychomycosis of toenail 07/25/2015   Diabetic neuropathy ([place]) 07/25/2015   DUB (dysfunctional uterine bleeding) 02/03/2015   Diabetes type 2, controlled ([place]) 01/25/2015   Severe obesity (BMI >= 40) ([place]) 01/25/2015   Moderate persistent asthma 01/21/2015   COPD (chronic obstructive pulmonary disease) ([place]) 01/20/2015   Cardiomegaly 06/09/2014   Hypochromic microcytic anemia 06/08/2014   Former smoker 01/01/2013     Current Outpatient Medications on File Prior to Visit  Medication Sig Dispense Refill   acetaminophen (TYLENOL) 325 MG tablet Take 2 tablets (650 mg total) by mouth every 8 (eight) hours. 60 tablet 4   albuterol (PROVENTIL) (2.5 MG/3ML) 0.083%  nebulizer solution Take 3 mLs (2.5 mg total) by nebulization every 6 (six) hours as needed for wheezing or shortness of breath. 75 mL 12   albuterol (VENTOLIN HFA) 108 (90 Base) MCG/ACT inhaler Inhale 2 puffs into the lungs every 6 (six) hours as needed for wheezing or shortness of breath. 3 Inhaler 12   blood glucose meter kit and supplies KIT Dispense based on patient and insurance preference. Use up to four times daily as directed. (FOR ICD-9 250.00, 250.01). 1 each 0   Blood Glucose Monitoring Suppl (TRUE METRIX METER) w/Device KIT USE AS INSTRUCTED 1 kit 0   DULoxetine (CYMBALTA) 60 MG capsule Take 1 capsule (60 mg total) by mouth daily. 30 capsule 6   ferrous sulfate 325 (65 FE) MG tablet Take 1 tablet (325 mg total)  by mouth 2 (two) times daily with a meal. 120 tablet 2   gabapentin (NEURONTIN) 300 MG capsule Take 1 capsule (300 mg total) by mouth 2 (two) times daily. 60 capsule 6   glucose blood (TRUE METRIX BLOOD GLUCOSE TEST) test strip Use as instructed 100 each 12   lisinopril (ZESTRIL) 10 MG tablet Take 1 tablet (10 mg total) by mouth daily. 30 tablet 3   megestrol (MEGACE) 40 MG tablet Take 1 tablet (40 mg total) by mouth 2 (two) times daily. 120 tablet 1   meloxicam (MOBIC) 15 MG tablet Take 1 tablet (15 mg total) by mouth daily. 30 tablet 6   metFORMIN (GLUCOPHAGE) 500 MG tablet Take 1 tablet (500 mg total) by mouth 2 (two) times daily. 60 tablet 5   mometasone-formoterol (DULERA) 100-5 MCG/ACT AERO Inhale 2 puffs into the lungs 2 (two) times daily. 13 g 12   traZODone (DESYREL) 100 MG tablet Take 1 tablet (100 mg total) by mouth at bedtime as needed for sleep. 30 tablet 6   TRUEPLUS LANCETS 28G MISC Check blood sugar TID & QHS 100 each 2   No current facility-administered medications on file prior to visit.     No Known Allergies  Social History   Socioeconomic History   Marital status: Widowed    Spouse name: Not on file   Number of children: Not on file   Years of education: Not on file   Highest education level: Not on file  Occupational History   Not on file  Social Needs   Financial resource strain: Not on file   Food insecurity    Worry: Not on file    Inability: Not on file   Transportation needs    Medical: Not on file    Non-medical: Not on file  Tobacco Use   Smoking status: Former Smoker    Packs/day: 0.25    Years: 20.00    Pack years: 5.00    Types: Cigarettes    Quit date: 01/18/2015    Years since quitting: 4.5   Smokeless tobacco: Never Used  Substance and Sexual Activity   Alcohol use: Yes    Comment: 01/20/2015 "dank some alcohol on my birthday, cookouts,  etc,;  maybe once/month"   Drug use: No   Sexual activity: Not Currently     Birth control/protection: None  Lifestyle   Physical activity    Days per week: Not on file    Minutes per session: Not on file   Stress: Not on file  Relationships   Social connections    Talks on phone: Not on file    Gets together: Not on file    Attends religious  service: Not on file    Active member of club or organization: Not on file    Attends meetings of clubs or organizations: Not on file    Relationship status: Not on file   Intimate partner violence    Fear of current or ex partner: Not on file    Emotionally abused: Not on file    Physically abused: Not on file    Forced sexual activity: Not on file  Other Topics Concern   Not on file  Social History Narrative   Not on file    Family History  Problem Relation Age of Onset   Asthma Mother    Asthma Daughter     Past Surgical History:  Procedure Laterality Date   CESAREAN SECTION  1992    ROS: Review of Systems Negative except as stated above  PHYSICAL EXAM: BP 117/83    Pulse 75    Temp 98.4 F (36.9 C) (Oral)    Resp 16    Wt (!) 340 lb 12.8 oz (154.6 kg)    SpO2 99%    BMI 59.42 kg/m   Wt Readings from Last 3 Encounters:  07/24/19 (!) 340 lb 12.8 oz (154.6 kg)  03/12/19 (!) 335 lb 3.2 oz (152 kg)  10/02/18 (!) 330 lb (149.7 kg)    Physical Exam  General appearance - alert, well appearing, morbidly obese African-American female and in no distress Mental status - normal mood, behavior, speech, dress, motor activity, and thought processes Neck - supple, no significant adenopathy Chest - clear to auscultation, no wheezes, rales or rhonchi, symmetric air entry Heart - normal rate, regular rhythm, normal S1, S2, no murmurs, rubs, clicks or gallops Extremities - peripheral pulses normal, no pedal edema, no clubbing or cyanosis Skin -waxy appearing rash in the skin fold of the inner upper thigh right side.  She has some chafing of the upper inner thighs. MSK: Ambulates without assistance and  get on exam table.  Knees: Large body habitus.  Mild discomfort with passive range of motion of the left knee. Diabetic Foot Exam - Simple   Simple Foot Form Visual Inspection No deformities, no ulcerations, no other skin breakdown bilaterally: Yes Sensation Testing See comments: Yes Pulse Check Posterior Tibialis and Dorsalis pulse intact bilaterally: Yes Comments Decreased sensation to leap exam on the plantar surface of the feet.      CMP Latest Ref Rng & Units 03/18/2019 11/18/2017 03/08/2017  Glucose 65 - 99 mg/dL 82 76 85  BUN 6 - 24 mg/dL _0 Creatinine 0.57 - 1.00 mg/dL 0.87 0.87 0.84  Sodium 134 - 144 mmol/L 143 142 144  Potassium 3.5 - 5.2 mmol/L 4.0 4.8 4.5  Chloride 96 - 106 mmol/L 105 103 106  CO2 20 - 29 mmol/L _1 Calcium 8.7 - 10.2 mg/dL 9.5 9.2 9.0  Total Protein 6.0 - 8.5 g/dL 6.9 6.6 -  Total Bilirubin 0.0 - 1.2 mg/dL <0.2 0.2 -  Alkaline Phos 39 - 117 IU/L 91 95 -  AST 0 - 40 IU/L 16 10 -  ALT 0 - 32 IU/L 20 12 -   Lipid Panel     Component Value Date/Time   CHOL 132 03/18/2019 1335   TRIG 67 03/18/2019 1335   HDL 40 03/18/2019 1335   CHOLHDL 3.3 03/18/2019 1335   CHOLHDL 3.4 01/20/2015 2241   VLDL 15 01/20/2015 2241   LDLCALC 79 03/18/2019 1335    CBC    Component  Value Date/Time   WBC 6.9 03/18/2019 1335   WBC 8.6 12/20/2016 1048   RBC 4.59 03/18/2019 1335   RBC 4.48 12/20/2016 1048   HGB 11.5 03/18/2019 1335   HCT 38.4 03/18/2019 1335   PLT 430 03/18/2019 1335   MCV 84 03/18/2019 1335   MCH 25.1 (L) 03/18/2019 1335   MCH 22.8 (L) 12/20/2016 1048   MCHC 29.9 (L) 03/18/2019 1335   MCHC 29.8 (L) 12/20/2016 1048   RDW 15.8 (H) 03/18/2019 1335   LYMPHSABS 2.4 03/08/2017 1101   MONOABS 516 12/20/2016 1048   EOSABS 0.2 03/08/2017 1101   BASOSABS 0.0 03/08/2017 1101    ASSESSMENT AND PLAN: 1. Type 2 diabetes, controlled, with neuropathy ([place]) Reported blood sugars are good.  Last A1c in June was 6. She needs to do better with  eating habits.  Encouraged her to eliminate sugary drinks.  She has set that as a goal.  She will try to drink more water rather than sodas.  Continue current dose of metformin Encouraged her to try and get an eye exam done. - POCT glucose (manual entry)  2. Dyspnea on exertion Likely due to deconditioning.  Other possibilities are worsening iron deficiency.  She also has asthma.  Recommend using albuterol inhaler before activity to see whether this makes a difference.  Clinical exam not suggestive of CHF but we will check a BNP - CBC - Brain natriuretic peptide  3. Morbid obesity ([place]) See #1 above  4. Moderate persistent asthma without complication Reports and seems to be under good control  5. Essential hypertension Controlled on lisinopril.  6. Primary osteoarthritis of left knee Continue meloxicam and Cymbalta.  Letter given in support of her getting Medicaid.  7. Pruritic intertrigo - nystatin-triamcinolone (MYCOLOG II) cream; Apply 1 application topically 2 (two) times daily.  Dispense: 60 g; Refill: 0  8. Bacterial vaginosis - metroNIDAZOLE (FLAGYL) 500 MG tablet; Take 1 tablet (500 mg total) by mouth 2 (two) times daily.  Dispense: 14 tablet; Refill: 0  9. Influenza vaccination declined      Patient was given the opportunity to ask questions.  Patient verbalized understanding of the plan and was able to repeat key elements of the plan.   Orders Placed This Encounter  Procedures   CBC   Brain natriuretic peptide   POCT glucose (manual entry)     Requested Prescriptions   Signed Prescriptions Disp Refills   nystatin-triamcinolone (MYCOLOG II) cream 60 g 0    Sig: Apply 1 application topically 2 (two) times daily.   metroNIDAZOLE (FLAGYL) 500 MG tablet 14 tablet 0    Sig: Take 1 tablet (500 mg total) by mouth 2 (two) times daily.    Return in about 4 months (around 11/24/2019).  Karle Plumber, MD, FACP

## 2019-07-25 LAB — CBC
Hematocrit: 35.6 % (ref 34.0–46.6)
Hemoglobin: 11.3 g/dL (ref 11.1–15.9)
MCH: 26.5 pg — ABNORMAL LOW (ref 26.6–33.0)
MCHC: 31.7 g/dL (ref 31.5–35.7)
MCV: 83 fL (ref 79–97)
Platelets: 436 10*3/uL (ref 150–450)
RBC: 4.27 x10E6/uL (ref 3.77–5.28)
RDW: 14.6 % (ref 11.7–15.4)
WBC: 8.7 10*3/uL (ref 3.4–10.8)

## 2019-07-25 LAB — BRAIN NATRIURETIC PEPTIDE: BNP: <2.5 pg/mL (ref 0.0–100.0)

## 2019-07-28 ENCOUNTER — Telehealth: Payer: Self-pay

## 2019-07-28 NOTE — Telephone Encounter (Signed)
Contacted pt to go over lab results pt didn't answer lvm asking pt to give me a call at her earliest convenience  

## 2019-08-04 MED FILL — ?DULOXETINE HCL 60 MG CPEP: 60 | 30 days supply | Qty: 30 | Fill #4

## 2019-08-04 MED FILL — traZODone HCL 100 MG TABS: 100 | 30 days supply | Qty: 30 | Fill #1

## 2019-08-04 MED FILL — LISINOPRIL 10 MG TABS: 10 | 30 days supply | Qty: 30 | Fill #2

## 2019-08-04 MED FILL — MELOXICAM 15 MG TABLET: 15 | 30 days supply | Qty: 30 | Fill #4

## 2019-08-27 MED FILL — ?METFORMIN HCL 500MG TABLET: 500 | 30 days supply | Qty: 60 | Fill #2

## 2019-08-27 MED FILL — MEGESTROL 40 MG TABLET: 40 | 30 days supply | Qty: 60 | Fill #2

## 2019-09-17 MED FILL — ?DULOXETINE HCL 60 MG CPEP: 60 | 30 days supply | Qty: 30 | Fill #5

## 2019-09-17 MED FILL — MELOXICAM 15 MG TABLET: 15 | 30 days supply | Qty: 30 | Fill #5

## 2019-09-17 MED FILL — traZODone HCL 100 MG TABS: 100 | 30 days supply | Qty: 30 | Fill #2

## 2019-09-23 ENCOUNTER — Other Ambulatory Visit: Payer: Self-pay

## 2019-09-23 ENCOUNTER — Ambulatory Visit: Payer: Self-pay | Attending: Internal Medicine | Admitting: Physician Assistant

## 2019-09-23 DIAGNOSIS — E118 Type 2 diabetes mellitus with unspecified complications: Secondary | ICD-10-CM

## 2019-09-23 DIAGNOSIS — R4586 Emotional lability: Secondary | ICD-10-CM

## 2019-09-23 DIAGNOSIS — R232 Flushing: Secondary | ICD-10-CM

## 2019-09-23 NOTE — Progress Notes (Signed)
Pt states she has ben crying a lot and she doesn't know why

## 2019-09-23 NOTE — Progress Notes (Signed)
Patient ID: Hannah Baldwin, female   DOB: 10-25-1970, 48 y.o.   MRN: [id number] Virtual Visit via Telephone Note  I connected with Hannah Baldwin on 09/23/19 at  3:30 PM EST by telephone and verified that I am speaking with the correct person using two identifiers.   I discussed the limitations, risks, security and privacy concerns of performing an evaluation and management service by telephone and the availability of in person appointments. I also discussed with the patient that there may be a patient responsible charge related to this service. The patient expressed understanding and agreed to proceed.  Patient location:  home My Location:  [place] office Persons on the call:  Me and the patient   History of Present Illness: 1 week h/o crying out of the blue.  She does have a lot of stress with her children and quarantine guidelines.  She has also been having  Mood swings and hot flashes.  Sleep has been poor.  She has been taking 2 trazadone for sleep.  She denies SI/HI.  She is still taking megace for heavy periods.    Observations/Objective:  NAD.  A&Ox3   Assessment and Plan: 1. Mood swings ? Menopausal changes - Ambulatory referral to Social Work -hold 3 doses of megace prior to labs - TSH; Future - FSH/LH; Future  2. Hot flashes - TSH; Future - Vitamin D, 25-hydroxy; Future - CBC with Differential; Future - FSH/LH; Future  3. Controlled diabetes mellitus type 2 with complications, unspecified whether long term insulin use (HCC) - Hemoglobin A1c; Future    Follow Up Instructions: See PCP in 1 month   I discussed the assessment and treatment plan with the patient. The patient was provided an opportunity to ask questions and all were answered. The patient agreed with the plan and demonstrated an understanding of the instructions.   The patient was advised to call back or seek an in-person evaluation if the symptoms worsen or if the condition fails to improve as  anticipated.  I provided 11 minutes of non-face-to-face time during this encounter.   Freeman Caldron, PA-C

## 2019-09-25 ENCOUNTER — Other Ambulatory Visit: Payer: Self-pay

## 2019-09-25 ENCOUNTER — Ambulatory Visit: Payer: Self-pay | Attending: Internal Medicine

## 2019-09-25 DIAGNOSIS — R4586 Emotional lability: Secondary | ICD-10-CM

## 2019-09-25 DIAGNOSIS — E118 Type 2 diabetes mellitus with unspecified complications: Secondary | ICD-10-CM

## 2019-09-25 DIAGNOSIS — R232 Flushing: Secondary | ICD-10-CM

## 2019-09-26 LAB — CBC WITH DIFFERENTIAL/PLATELET
Basophils Absolute: 0 10*3/uL (ref 0.0–0.2)
Basos: 0 %
EOS (ABSOLUTE): 0.1 10*3/uL (ref 0.0–0.4)
Eos: 1 %
Hematocrit: 35.6 % (ref 34.0–46.6)
Hemoglobin: 11 g/dL — ABNORMAL LOW (ref 11.1–15.9)
Immature Grans (Abs): 0 10*3/uL (ref 0.0–0.1)
Immature Granulocytes: 0 %
Lymphocytes Absolute: 2.1 10*3/uL (ref 0.7–3.1)
Lymphs: 27 %
MCH: 25.9 pg — ABNORMAL LOW (ref 26.6–33.0)
MCHC: 30.9 g/dL — ABNORMAL LOW (ref 31.5–35.7)
MCV: 84 fL (ref 79–97)
Monocytes Absolute: 0.5 10*3/uL (ref 0.1–0.9)
Monocytes: 6 %
Neutrophils Absolute: 5.2 10*3/uL (ref 1.4–7.0)
Neutrophils: 66 %
Platelets: 399 10*3/uL (ref 150–450)
RBC: 4.25 x10E6/uL (ref 3.77–5.28)
RDW: 14.3 % (ref 11.7–15.4)
WBC: 8 10*3/uL (ref 3.4–10.8)

## 2019-09-26 LAB — TSH: TSH: 1.93 u[IU]/mL (ref 0.450–4.500)

## 2019-09-26 LAB — HEMOGLOBIN A1C
Est. average glucose Bld gHb Est-mCnc: 123 mg/dL
Hgb A1c MFr Bld: 5.9 % — ABNORMAL HIGH (ref 4.8–5.6)

## 2019-09-26 LAB — FSH/LH
FSH: 3.5 m[IU]/mL
LH: 1.1 m[IU]/mL

## 2019-09-26 LAB — VITAMIN D 25 HYDROXY (VIT D DEFICIENCY, FRACTURES): Vit D, 25-Hydroxy: 10.3 ng/mL — ABNORMAL LOW (ref 30.0–100.0)

## 2019-09-29 ENCOUNTER — Other Ambulatory Visit: Payer: Self-pay | Admitting: Physician Assistant

## 2019-09-29 MED ORDER — VITAMIN D (ERGOCALCIFEROL) 1.25 MG (50000 UNIT) PO CAPS: 50000.0000 [IU] | ORAL_CAPSULE | ORAL | 0 refills | Status: DC

## 2019-09-29 MED FILL — VIT D2 1.25 MG (50,000 UNIT: 1.25 MG | 84 days supply | Qty: 12 | Fill #0

## 2019-10-16 ENCOUNTER — Telehealth: Payer: Self-pay | Admitting: Licensed Clinical Social Worker

## 2019-10-16 NOTE — Telephone Encounter (Signed)
Call placed to patient. LCSW introduced self and explained role at [facility]. Pt was informed of consult from PCP and scheduled [facility] appointment for 10/26/19. No additional concerns noted.

## 2019-10-19 MED FILL — ?DULOXETINE HCL 60 MG CPEP: 60 | 30 days supply | Qty: 30 | Fill #6

## 2019-10-19 MED FILL — traZODone HCL 100 MG TABS: 100 | 30 days supply | Qty: 30 | Fill #3

## 2019-10-19 MED FILL — MELOXICAM 15 MG TABLET: 15 | 30 days supply | Qty: 30 | Fill #6

## 2019-10-19 MED FILL — LISINOPRIL 10 MG TABS: 10 | 30 days supply | Qty: 30 | Fill #3

## 2019-10-19 MED FILL — ?METFORMIN HCL 500MG TABLET: 500 | 30 days supply | Qty: 60 | Fill #3

## 2019-10-21 MED FILL — ALBUTEROL SULFATE HFA 108 (: 108 (90 BAS | 25 days supply | Qty: 18 | Fill #0

## 2019-10-26 ENCOUNTER — Other Ambulatory Visit: Payer: Self-pay

## 2019-10-26 ENCOUNTER — Telehealth: Payer: Self-pay | Admitting: Licensed Clinical Social Worker

## 2019-10-26 ENCOUNTER — Ambulatory Visit: Payer: Self-pay | Attending: Internal Medicine | Admitting: Licensed Clinical Social Worker

## 2019-10-26 NOTE — Telephone Encounter (Signed)
Call placed to patient two times in regards to scheduled [facility] appointment. LCSW left a message requesting return call.

## 2019-10-26 NOTE — BH Specialist Note (Signed)
Opened in error

## 2019-11-26 ENCOUNTER — Other Ambulatory Visit: Payer: Self-pay

## 2019-11-26 ENCOUNTER — Ambulatory Visit: Payer: Self-pay | Attending: Internal Medicine | Admitting: Internal Medicine

## 2019-11-27 ENCOUNTER — Other Ambulatory Visit: Payer: Self-pay | Admitting: Internal Medicine

## 2019-11-27 DIAGNOSIS — M1712 Unilateral primary osteoarthritis, left knee: Secondary | ICD-10-CM

## 2019-11-27 MED FILL — MELOXICAM 15 MG TABLET: 15 | 30 days supply | Qty: 30 | Fill #0

## 2019-11-27 MED FILL — MEGESTROL 40 MG TABLET: 40 | 30 days supply | Qty: 60 | Fill #3

## 2019-11-27 MED FILL — traZODone HCL 100 MG TABS: 100 | 30 days supply | Qty: 30 | Fill #4

## 2019-11-27 MED FILL — DULoxetine HCL 60 MG CPEP: 60 | 30 days supply | Qty: 30 | Fill #0

## 2019-12-03 ENCOUNTER — Emergency Department (HOSPITAL_COMMUNITY)
Admission: EM | Admit: 2019-12-03 | Discharge: 2019-12-03 | Disposition: A | Discharge status: Home / Self Care | Payer: Self-pay | Attending: Emergency Medicine | Admitting: Emergency Medicine

## 2019-12-03 ENCOUNTER — Other Ambulatory Visit: Payer: Self-pay

## 2019-12-03 ENCOUNTER — Encounter (HOSPITAL_COMMUNITY): Payer: Self-pay | Admitting: Emergency Medicine

## 2019-12-03 ENCOUNTER — Emergency Department (HOSPITAL_COMMUNITY): Payer: Self-pay

## 2019-12-03 DIAGNOSIS — R079 Chest pain, unspecified: Secondary | ICD-10-CM

## 2019-12-03 DIAGNOSIS — R0789 Other chest pain: Secondary | ICD-10-CM | POA: Insufficient documentation

## 2019-12-03 DIAGNOSIS — Z87891 Personal history of nicotine dependence: Secondary | ICD-10-CM | POA: Insufficient documentation

## 2019-12-03 DIAGNOSIS — N644 Mastodynia: Secondary | ICD-10-CM | POA: Insufficient documentation

## 2019-12-03 DIAGNOSIS — Z7984 Long term (current) use of oral hypoglycemic drugs: Secondary | ICD-10-CM | POA: Insufficient documentation

## 2019-12-03 DIAGNOSIS — E119 Type 2 diabetes mellitus without complications: Secondary | ICD-10-CM | POA: Insufficient documentation

## 2019-12-03 DIAGNOSIS — J449 Chronic obstructive pulmonary disease, unspecified: Secondary | ICD-10-CM | POA: Insufficient documentation

## 2019-12-03 DIAGNOSIS — Z79899 Other long term (current) drug therapy: Secondary | ICD-10-CM | POA: Insufficient documentation

## 2019-12-03 DIAGNOSIS — R05 Cough: Secondary | ICD-10-CM | POA: Insufficient documentation

## 2019-12-03 DIAGNOSIS — R0602 Shortness of breath: Secondary | ICD-10-CM | POA: Insufficient documentation

## 2019-12-03 LAB — CBC
HCT: 38.8 % (ref 36.0–46.0)
Hemoglobin: 11.6 g/dL — ABNORMAL LOW (ref 12.0–15.0)
MCH: 24.9 pg — ABNORMAL LOW (ref 26.0–34.0)
MCHC: 29.9 g/dL — ABNORMAL LOW (ref 30.0–36.0)
MCV: 83.4 fL (ref 80.0–100.0)
Platelets: 439 10*3/uL — ABNORMAL HIGH (ref 150–400)
RBC: 4.65 MIL/uL (ref 3.87–5.11)
RDW: 15.2 % (ref 11.5–15.5)
WBC: 7 10*3/uL (ref 4.0–10.5)
nRBC: 0 % (ref 0.0–0.2)

## 2019-12-03 LAB — CBG MONITORING, ED
Glucose-Capillary: 82 mg/dL (ref 70–99)
Glucose-Capillary: 57 mg/dL — ABNORMAL LOW (ref 70–99)
Glucose-Capillary: 129 mg/dL — ABNORMAL HIGH (ref 70–99)

## 2019-12-03 LAB — BASIC METABOLIC PANEL
Anion gap: 9 (ref 5–15)
BUN: 9 mg/dL (ref 6–20)
CO2: 25 mmol/L (ref 22–32)
Calcium: 9.2 mg/dL (ref 8.9–10.3)
Chloride: 108 mmol/L (ref 98–111)
Creatinine, Ser: 0.93 mg/dL (ref 0.44–1.00)
GFR calc Af Amer: >60 mL/min (ref >60)
GFR calc non Af Amer: >60 mL/min (ref >60)
Glucose, Bld: 88 mg/dL (ref 70–99)
Potassium: 3.9 mmol/L (ref 3.5–5.1)
Sodium: 142 mmol/L (ref 135–145)

## 2019-12-03 LAB — I-STAT BETA HCG BLOOD, ED (MC, WL, AP ONLY): I-stat hCG, quantitative: <5 m[IU]/mL (ref <5)

## 2019-12-03 LAB — TROPONIN I (HIGH SENSITIVITY)
Troponin I (High Sensitivity): 4 ng/L (ref <18)
Troponin I (High Sensitivity): 4 ng/L (ref <18)

## 2019-12-03 LAB — D-DIMER, QUANTITATIVE: D-Dimer, Quant: 0.72 ug/mL-FEU — ABNORMAL HIGH (ref 0.00–0.50)

## 2019-12-03 MED ORDER — IOHEXOL 350 MG/ML SOLN
100.0000 mL | Freq: Once | INTRAVENOUS | Status: AC | PRN
  Administered 2019-12-03: 19:00:00 100 mL via INTRAVENOUS

## 2019-12-03 MED ORDER — SODIUM CHLORIDE 0.9% FLUSH: 3.0000 mL | Freq: Once | INTRAVENOUS | Status: DC

## 2019-12-03 MED ORDER — KETOROLAC TROMETHAMINE 15 MG/ML IJ SOLN
15.0000 mg | Freq: Once | INTRAMUSCULAR | Status: AC
  Administered 2019-12-03: 16:00:00 15 mg via INTRAMUSCULAR

## 2019-12-03 MED ORDER — KETOROLAC TROMETHAMINE 15 MG/ML IJ SOLN
15.0000 mg | Freq: Once | INTRAMUSCULAR | Status: DC
  Filled 2019-12-03: qty 1

## 2019-12-03 NOTE — ED Triage Notes (Signed)
Pt arrives to ED from home with complaints of sharp left sided chest pain that radiates to her left breast for the last two days. Patient states the pain continues to get worse without relief.

## 2019-12-03 NOTE — ED Provider Notes (Signed)
[facility] Provider Note   CSN: [id number] Arrival date & time: 12/03/19  1250     History Chief Complaint  Patient presents with  . Chest Pain    Hannah Baldwin is a 49 y.o. female.  The history is provided by the patient and medical records.  Chest Pain Pain location:  L chest Pain quality: sharp   Radiates to: left breast. Pain severity:  Moderate Onset quality:  Gradual Duration:  3 days Timing:  Constant Progression:  Worsening Chronicity:  New Context: breathing, movement and raising an arm   Context: not trauma   Relieved by:  Certain positions Worsened by:  Movement and deep breathing Associated symptoms: cough (chronic and unchanged) and shortness of breath   Associated symptoms: no abdominal pain, no back pain, no diaphoresis, no fever, no lower extremity edema, no nausea, no near-syncope and no vomiting   Risk factors: diabetes mellitus, hypertension and obesity   Risk factors: no coronary artery disease, no high cholesterol, no immobilization, not female, not pregnant, no prior DVT/PE and no smoking        Past Medical History:  Diagnosis Date  . Anemia   . Asthma Dx 2014  . Diabetes mellitus without complication ([place])   . 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 Pneumonia ~ 2000 X 1    Patient Active Problem List   Diagnosis Date Noted  . Fibroid uterus 01/09/2018  . Primary osteoarthritis of left knee 04/09/2017  . Insomnia 11/03/2015  . Onychomycosis of toenail 07/25/2015  . Diabetic neuropathy ([place]) 07/25/2015  . DUB (dysfunctional uterine bleeding) 02/03/2015  . Diabetes type 2, controlled ([place]) 01/25/2015  . Severe obesity (BMI >= 40) ([place]) 01/25/2015  . Moderate persistent asthma 01/21/2015  . COPD (chronic obstructive pulmonary disease) ([place]) 01/20/2015  . Cardiomegaly 06/09/2014  . Hypochromic microcytic anemia 06/08/2014  . Former smoker 01/01/2013    Past Surgical  History:  Procedure Laterality Date  . CESAREAN SECTION  1992     OB History    Gravida  2   Para  2   Term  0   Preterm  0   AB  0   Living  2     SAB  0   TAB  0   Ectopic  0   Multiple  0   Live Births  2           Family History  Problem Relation Age of Onset  . Asthma Mother   . Asthma Daughter     Social History   Tobacco Use  . Smoking status: Former Smoker    Packs/day: 0.25    Years: 20.00    Pack years: 5.00    Types: Cigarettes    Quit date: 01/18/2015    Years since quitting: 4.8  . Smokeless tobacco: Never Used  Substance Use Topics  . Alcohol use: Yes    Comment: 01/20/2015 "dank some alcohol on my birthday, cookouts,  etc,;  maybe once/month"  . Drug use: No    Home Medications Prior to Admission medications   Medication Sig Start Date End Date Taking? Authorizing Provider  acetaminophen (TYLENOL) 325 MG tablet Take 2 tablets (650 mg total) by mouth every 8 (eight) hours. Patient taking differently: Take 650 mg by mouth every 8 (eight) hours as needed for mild pain or headache.  04/09/17  Yes Ladell Pier, MD  albuterol (PROVENTIL) (  2.5 MG/3ML) 0.083% nebulizer solution Take 3 mLs (2.5 mg total) by nebulization every 6 (six) hours as needed for wheezing or shortness of breath. 03/12/19  Yes Ladell Pier, MD  albuterol (VENTOLIN HFA) 108 (90 Base) MCG/ACT inhaler Inhale 2 puffs into the lungs every 6 (six) hours as needed for wheezing or shortness of breath. 03/12/19  Yes Ladell Pier, MD  DULoxetine (CYMBALTA) 60 MG capsule TAKE 1 CAPSULE (60 MG TOTAL) BY MOUTH DAILY. 11/27/19  Yes Ladell Pier, MD  lisinopril (ZESTRIL) 10 MG tablet Take 1 tablet (10 mg total) by mouth daily. 03/12/19  Yes Ladell Pier, MD  megestrol (MEGACE) 40 MG tablet Take 1 tablet (40 mg total) by mouth 2 (two) times daily. 03/12/19  Yes Ladell Pier, MD  meloxicam (MOBIC) 15 MG tablet TAKE 1 TABLET (15 MG TOTAL) BY MOUTH DAILY. 11/27/19  Yes  Ladell Pier, MD  metFORMIN (GLUCOPHAGE) 500 MG tablet Take 1 tablet (500 mg total) by mouth 2 (two) times daily. 03/12/19  Yes Ladell Pier, MD  mometasone-formoterol (DULERA) 100-5 MCG/ACT AERO Inhale 2 puffs into the lungs 2 (two) times daily. Patient taking differently: Inhale 2 puffs into the lungs 2 (two) times daily as needed for wheezing or shortness of breath.  03/12/19  Yes Ladell Pier, MD  nystatin-triamcinolone (MYCOLOG II) cream Apply 1 application topically 2 (two) times daily. Patient taking differently: Apply 1 application topically 2 (two) times daily as needed (for rashes).  07/24/19  Yes Ladell Pier, MD  simethicone (MYLICON) 80 MG chewable tablet Chew 80-160 mg by mouth every 6 (six) hours as needed for flatulence.   Yes [provider]  traZODone (DESYREL) 100 MG tablet Take 1 tablet (100 mg total) by mouth at bedtime as needed for sleep. Patient taking differently: Take 100 mg by mouth at bedtime.  03/12/19  Yes Ladell Pier, MD  Vitamin D, Ergocalciferol, (DRISDOL) 1.25 MG (50000 UT) CAPS capsule Take 1 capsule (50,000 Units total) by mouth every 7 (seven) days. 09/29/19  Yes Freeman Caldron M, PA-C  blood glucose meter kit and supplies KIT Dispense based on patient and insurance preference. Use up to four times daily as directed. (FOR ICD-9 250.00, 250.01). 01/25/15   Charlott Rakes, MD  Blood Glucose Monitoring Suppl (TRUE METRIX METER) w/Device KIT USE AS INSTRUCTED 09/24/17   Ladell Pier, MD  ferrous sulfate 325 (65 FE) MG tablet Take 1 tablet (325 mg total) by mouth 2 (two) times daily with a meal. Patient not taking: Reported on 12/03/2019 03/12/19   Ladell Pier, MD  gabapentin (NEURONTIN) 300 MG capsule Take 1 capsule (300 mg total) by mouth 2 (two) times daily. Patient not taking: Reported on 12/03/2019 03/12/19   Ladell Pier, MD  glucose blood (TRUE METRIX BLOOD GLUCOSE TEST) test strip Use as instructed 09/24/17    Ladell Pier, MD  metroNIDAZOLE (FLAGYL) 500 MG tablet Take 1 tablet (500 mg total) by mouth 2 (two) times daily. Patient not taking: Reported on 12/03/2019 07/24/19   Ladell Pier, MD  TRUEPLUS LANCETS 28G MISC Check blood sugar TID & QHS 09/24/17   Ladell Pier, MD    Allergies    Patient has no known allergies.  Review of Systems   Review of Systems  Constitutional: Negative for diaphoresis and fever.  Respiratory: Positive for cough (chronic and unchanged) and shortness of breath.   Cardiovascular: Positive for chest pain. Negative for leg swelling and  near-syncope.  Gastrointestinal: Negative for abdominal pain, nausea and vomiting.  Musculoskeletal: Negative for back pain.  All other systems reviewed and are negative.   Physical Exam Updated Vital Signs BP (!) 146/104 (BP Location: Left Wrist)   Pulse 74   Temp 98 F (36.7 C) (Oral)   Resp 20   Wt (!) 155 kg   SpO2 100%   BMI 59.58 kg/m   Physical Exam Vitals and nursing note reviewed.  Constitutional:      General: She is not in acute distress.    Appearance: She is well-developed.  HENT:     Head: Normocephalic and atraumatic.  Eyes:     Conjunctiva/sclera: Conjunctivae normal.  Cardiovascular:     Rate and Rhythm: Normal rate and regular rhythm.     Heart sounds: No murmur.  Pulmonary:     Effort: Pulmonary effort is normal. No respiratory distress.     Breath sounds: Normal breath sounds.  Chest:     Chest wall: Tenderness present.  Abdominal:     Palpations: Abdomen is soft.     Tenderness: There is no abdominal tenderness. There is no guarding or rebound.  Musculoskeletal:     Cervical back: Neck supple.     Right lower leg: No edema.     Left lower leg: No edema.  Skin:    General: Skin is warm and dry.     Comments: Exam of the left breast done with Anderson Malta RN present.  No induration or fluctuance, no palpable masses, no erythema or streaking.  She does have some mild tenderness  to palpation across the medial left breast.  Neurological:     General: No focal deficit present.     Mental Status: She is alert and oriented to person, place, and time.  Psychiatric:        Mood and Affect: Mood normal.        Behavior: Behavior normal.     ED Results / Procedures / Treatments   Labs (all labs ordered are listed, but only abnormal results are displayed) Labs Reviewed  CBC - Abnormal; Notable for the following components:      Result Value   Hemoglobin 11.6 (*)    MCH 24.9 (*)    MCHC 29.9 (*)    Platelets 439 (*)    All other components within normal limits  D-DIMER, QUANTITATIVE (NOT AT [facility]) - Abnormal; Notable for the following components:   D-Dimer, Quant 0.72 (*)    All other components within normal limits  CBG MONITORING, ED - Abnormal; Notable for the following components:   Glucose-Capillary 57 (*)    All other components within normal limits  CBG MONITORING, ED - Abnormal; Notable for the following components:   Glucose-Capillary 129 (*)    All other components within normal limits  BASIC METABOLIC PANEL  I-STAT BETA HCG BLOOD, ED ([facility], [facility], [facility] ONLY)  CBG MONITORING, ED  TROPONIN I (HIGH SENSITIVITY)  TROPONIN I (HIGH SENSITIVITY)    EKG None  Radiology DG Chest 2 View  Result Date: 12/03/2019 CLINICAL DATA:  Chest pain EXAM: CHEST - 2 VIEW COMPARISON:  January 20, 2015 FINDINGS: Lungs are clear. Heart is borderline enlarged with pulmonary vascularity normal. No adenopathy. No bone lesions. IMPRESSION: Borderline cardiomegaly.  Lungs clear.  No adenopathy. Electronically Signed   By: Lowella Grip III M.D.   On: 12/03/2019 13:33   CT Angio Chest PE W and/or Wo Contrast  Result Date: 12/03/2019 CLINICAL DATA:  Shortness  of breath EXAM: CT ANGIOGRAPHY CHEST WITH CONTRAST TECHNIQUE: Multidetector CT imaging of the chest was performed using the standard protocol during bolus administration of intravenous contrast. Multiplanar CT image  reconstructions and MIPs were obtained to evaluate the vascular anatomy. CONTRAST:  146m OMNIPAQUE IOHEXOL 350 MG/ML SOLN COMPARISON:  None. FINDINGS: Cardiovascular: Evaluation is limited by respiratory motion artifact.Given this limitation, no acute pulmonary embolism was detected. Detection of pulmonary emboli at the subsegmental level is limited, especially at the lung bases. The heart size is mildly enlarged. There is no significant pericardial effusion. There is mild reflux of contrast into the IVC. There is no evidence for thoracic aortic aneurysm. There are no significant atherosclerotic changes. Mediastinum/Nodes: --No mediastinal or hilar lymphadenopathy. --No axillary lymphadenopathy. --No supraclavicular lymphadenopathy. --Normal thyroid gland. --The esophagus is unremarkable Lungs/Pleura: No pulmonary nodules or masses. No pleural effusion or pneumothorax. No focal airspace consolidation. No focal pleural abnormality. Upper Abdomen: No acute abnormality. Musculoskeletal: No chest wall abnormality. No acute or significant osseous findings. There is mild infiltration of the retrosternal fat, of doubtful clinical significance. No evidence for sternal fracture. Review of the MIP images confirms the above findings. IMPRESSION: 1. Evaluation is limited by respiratory motion artifact. Given this limitation, no acute pulmonary embolism was detected. Detection of pulmonary emboli at the subsegmental level is limited, especially at the lung bases. 2. Mild cardiomegaly. There is mild reflux of contrast into the IVC which can be seen in patients with underlying cardiac dysfunction. Electronically Signed   By: CConstance HolsterM.D.   On: 12/03/2019 19:50    Procedures Procedures (including critical care time)  Medications Ordered in ED Medications  ketorolac (TORADOL) 15 MG/ML injection 15 mg (15 mg Intramuscular Given 12/03/19 1606)  iohexol (OMNIPAQUE) 350 MG/ML injection 100 mL (100 mLs Intravenous  Contrast Given 12/03/19 1908)    ED Course  I have reviewed the triage vital signs and the nursing notes.  Pertinent labs & imaging results that were available during my care of the patient were reviewed by me and considered in my medical decision making (see chart for details).    MDM Rules/Calculators/A&P                      Here for chest pain.  Her pain is very atypical, predominantly worse with movement of her arm and deep breathing, it is not exertional, her EKG shows a sinus rhythm at a rate of 68, normal axis and intervals, no ST or T wave changes concerning for ischemia or infarction.  Her troponin was negative x2, as her pain is not exertional, with a normal EKG and negative troponins I have a low suspicion of ACS at this time.  She has a low risk Wells score and D-dimer was slightly positive so CTA was performed which did not show evidence of a PE.  She has no pulse deficits, no tearing or ripping pain, no neurological deficits, do not suspect dissection at this time.  Symptoms are likely musculoskeletal in nature as there works with her moving her arm and lifting things.  She also is complaining of some pain along the medial aspect of her left breast, she does not have any evidence of infection or abscess on my exam.  Instructed to follow-up with her primary care provider, strict return precaution provided, discharged in stable condition.   Final Clinical Impression(s) / ED Diagnoses Final diagnoses:  Chest pain, unspecified type    Rx / DC Orders ED Discharge Orders  None       Hunter, [place], MD 12/04/19 [place], Ankit, MD 12/09/19 [id number]

## 2019-12-03 NOTE — ED Notes (Signed)
Verbalized understanding of DC instructions and follow up care 

## 2020-01-06 ENCOUNTER — Other Ambulatory Visit: Payer: Self-pay | Admitting: Internal Medicine

## 2020-01-06 DIAGNOSIS — I10 Essential (primary) hypertension: Secondary | ICD-10-CM

## 2020-01-06 MED FILL — DULoxetine HCL 60 MG CPEP: 60 | 30 days supply | Qty: 30 | Fill #1

## 2020-01-06 MED FILL — MELOXICAM 15 MG TABLET: 15 | 30 days supply | Qty: 30 | Fill #1

## 2020-01-07 MED FILL — LISINOPRIL 10 MG TABS: 10 | 30 days supply | Qty: 30 | Fill #0

## 2020-01-27 ENCOUNTER — Other Ambulatory Visit: Payer: Self-pay | Admitting: Internal Medicine

## 2020-01-27 DIAGNOSIS — N938 Other specified abnormal uterine and vaginal bleeding: Secondary | ICD-10-CM

## 2020-01-27 MED FILL — TRAZODONE HCL 100 MG TABS: 100 | 30 days supply | Qty: 30 | Fill #5

## 2020-01-27 MED FILL — metFORMIN HCL 500 MG TABS: 500 | 30 days supply | Qty: 60 | Fill #4

## 2020-01-28 ENCOUNTER — Ambulatory Visit: Payer: Self-pay | Admitting: Internal Medicine

## 2020-01-28 MED FILL — MEGESTROL 40 MG TABLET: 40 | 30 days supply | Qty: 60 | Fill #0

## 2020-02-26 ENCOUNTER — Ambulatory Visit: Payer: Self-pay | Attending: Internal Medicine | Admitting: Internal Medicine

## 2020-02-26 ENCOUNTER — Other Ambulatory Visit: Payer: Self-pay

## 2020-02-26 ENCOUNTER — Encounter: Payer: Self-pay | Admitting: Internal Medicine

## 2020-02-26 VITALS — BP 125/79 | HR 66 | Temp 97.5°F | Resp 16 | Wt 320.4 lb

## 2020-02-26 DIAGNOSIS — R252 Cramp and spasm: Secondary | ICD-10-CM | POA: Insufficient documentation

## 2020-02-26 DIAGNOSIS — L304 Erythema intertrigo: Secondary | ICD-10-CM

## 2020-02-26 DIAGNOSIS — E1140 Type 2 diabetes mellitus with diabetic neuropathy, unspecified: Secondary | ICD-10-CM

## 2020-02-26 DIAGNOSIS — M1712 Unilateral primary osteoarthritis, left knee: Secondary | ICD-10-CM

## 2020-02-26 DIAGNOSIS — I10 Essential (primary) hypertension: Secondary | ICD-10-CM

## 2020-02-26 DIAGNOSIS — N938 Other specified abnormal uterine and vaginal bleeding: Secondary | ICD-10-CM

## 2020-02-26 DIAGNOSIS — F4321 Adjustment disorder with depressed mood: Secondary | ICD-10-CM

## 2020-02-26 LAB — GLUCOSE, POCT (MANUAL RESULT ENTRY): POC Glucose: 83 mg/dl (ref 70–99)

## 2020-02-26 LAB — POCT GLYCOSYLATED HEMOGLOBIN (HGB A1C): HbA1c POC (<> result, manual entry): 5.4 % (ref 4.0–5.6)

## 2020-02-26 MED ORDER — FLUCONAZOLE 150 MG PO TABS
ORAL_TABLET | ORAL | 6 refills | Status: DC
  Filled 2021-01-17: qty 1, 30d supply, fill #0

## 2020-02-26 MED ORDER — MEGESTROL ACETATE 40 MG PO TABS: 40.0000 mg | ORAL_TABLET | Freq: Two times a day (BID) | ORAL | 0 refills | Status: DC

## 2020-02-26 MED ORDER — LISINOPRIL 10 MG PO TABS: 10.0000 mg | ORAL_TABLET | Freq: Every day | ORAL | 0 refills | Status: DC

## 2020-02-26 MED ORDER — MELOXICAM 15 MG PO TABS: 15.0000 mg | ORAL_TABLET | Freq: Every day | ORAL | 2 refills | Status: DC

## 2020-02-26 MED ORDER — DULOXETINE HCL 60 MG PO CPEP: 60.0000 mg | ORAL_CAPSULE | Freq: Every day | ORAL | 2 refills | Status: DC

## 2020-02-26 MED ORDER — CYCLOBENZAPRINE HCL 10 MG PO TABS: 10.0000 mg | ORAL_TABLET | Freq: Every day | ORAL | 1 refills | Status: DC | PRN

## 2020-02-26 MED ORDER — METFORMIN HCL 500 MG PO TABS: 500.0000 mg | ORAL_TABLET | Freq: Two times a day (BID) | ORAL | 5 refills | Status: DC

## 2020-02-26 MED FILL — FLUCONAZOLE 150 MG TABLET: 150 | 30 days supply | Qty: 1 | Fill #0

## 2020-02-26 MED FILL — CYCLOBENZAPRINE 10 MG TAB: 10 | 30 days supply | Qty: 30 | Fill #0

## 2020-02-26 MED FILL — METFORMIN HCL 500 MG TABS: 500 | 30 days supply | Qty: 60 | Fill #0

## 2020-02-26 MED FILL — LISINOPRIL 10 MG TABS: 10 | 30 days supply | Qty: 30 | Fill #0

## 2020-02-26 MED FILL — MELOXICAM 15 MG TABLET: 15 | 30 days supply | Qty: 30 | Fill #0

## 2020-02-26 MED FILL — DULoxetine HCL 60 MG CPEP: 60 | 30 days supply | Qty: 30 | Fill #0

## 2020-02-26 MED FILL — MEGESTROL 40 MG TABLET: 40 | 30 days supply | Qty: 60 | Fill #0

## 2020-02-26 NOTE — Progress Notes (Signed)
Pt states she is starting to take 2 Trazadone in order for her to go to sleep  Pt is requesting something for muscle cramps  [id number]

## 2020-02-26 NOTE — Patient Instructions (Signed)
I have prescribed Flexeril 10 mg to use as needed for muscle spasms.    Prescribed Diflucan 150 mg to take once per month to help decrease recurrence of rash.  Follow a Healthy Eating Plan - You can do it! Limit sugary drinks.  Avoid sodas, sweet tea, sport or energy drinks, or fruit drinks.  Drink water, lo-fat milk, or diet drinks. Limit snack foods.   Cut back on candy, cake, cookies, chips, ice cream.  These are a special treat, only in small amounts. Eat plenty of vegetables.  Especially dark green, red, and orange vegetables. Aim for at least 3 servings a day. More is better! Include fruit in your daily diet.  Whole fruit is much healthier than fruit juice! Limit "white" bread, "white" pasta, "white" rice.   Choose "100% whole grain" products, brown or wild rice. Avoid fatty meats. Try "Meatless Monday" and choose eggs or beans one day a week.  When eating meat, choose lean meats like chicken, [place], and fish.  Grill, broil, or bake meats instead of frying, and eat poultry without the skin. Eat less salt.  Avoid frozen pizzas, frozen dinners and salty foods.  Use seasonings other than salt in cooking.  This can help blood pressure and keep you from swelling Beer, wine and liquor have calories.  If you can safely drink alcohol, limit to 1 drink per day for women, 2 drinks for men

## 2020-02-26 NOTE — Progress Notes (Signed)
Patient ID: Hannah Baldwin, female    DOB: 10/19/1970  MRN: [id number]  CC: Diabetes, Insomnia, and muscle cramps   Subjective: Hannah Baldwin is a 49 y.o. 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   C/o muscle cramps in legs intermittently for over 1 mth.  They are very painful when they occur.  Occurs when driving and laying in bed.  Took a fried's muscle relaxant and found it to be helpful  Complains of feeling down.  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  The client died on Mother's Day.  Since then she has been withdrawn not wanting to talk on the phone or get out of the house other than when she has to work.  Her family has been very supportive and encouraging to her.  Denies any suicidal thoughts at this time.   Intertrigo: in skin fold on the lower legs that goes and comes.  Diflucan works but antifungal cream does not help.  She is requesting Diflucan.  She states that when she takes it the rash clears up for about 1 month and then starts to recur.    DIABETES TYPE 2/Obesity Last A1C:   A1C 5.4 Med Adherence:  _0  Yes she is on Metformin   _1  No Medication side effects:  _2  Yes    _3  No Home Monitoring?  _4  Yes    _5  No Home glucose results range: Diet Adherence: _6  Yes  -reports appetite dec lately.  Eating 1-2 meals a day.  Snack on KitKat every night.  Drinking less sodas.  Try to find 0-calorie flavor water as I recommended.  The one that she purchased has 190 cal/bottle Exercise: Patient states that when she gets out of the house she is constantly going.  Weight is down 19 pounds from February of this year Hypoglycemic episodes?: _7  Yes    _8  No Numbness of the feet? _9  Yes    _10   No Retinopathy hx? _11  Yes    _12  No Last eye exam: Overdue for eye exam.  [employer].  Requesting refill on her medications including meloxicam and Cymbalta for the arthritis.  Requests refill on trazodone to help with sleep. Patient Active Problem List   Diagnosis Date Noted  . Fibroid uterus 01/09/2018  . Primary osteoarthritis of left knee 04/09/2017  . Insomnia 11/03/2015  . Onychomycosis of toenail 07/25/2015  . Diabetic neuropathy ([place]) 07/25/2015  . DUB (dysfunctional uterine bleeding) 02/03/2015  . Diabetes type 2, controlled ([place]) 01/25/2015  . Severe obesity (BMI >= 40) ([place]) 01/25/2015  . Moderate persistent asthma 01/21/2015  . COPD (chronic obstructive pulmonary disease) ([place]) 01/20/2015  . Cardiomegaly 06/09/2014  . Hypochromic microcytic anemia 06/08/2014  . Former smoker 01/01/2013     Current Outpatient Medications on File Prior to Visit  Medication Sig Dispense Refill  . acetaminophen (TYLENOL) 325 MG tablet Take 2 tablets (650 mg total) by mouth every 8 (eight) hours. (Patient taking differently: Take 650 mg by mouth every 8 (eight) hours as needed for mild pain or headache. ) 60 tablet 4  . albuterol (PROVENTIL) (2.5 MG/3ML) 0.083% nebulizer solution Take 3 mLs (2.5 mg total) by nebulization every 6 (six) hours as needed for wheezing or shortness of breath. 75 mL 12  .  albuterol (VENTOLIN HFA) 108 (90 Base) MCG/ACT inhaler Inhale 2 puffs into the lungs every 6 (six) hours as needed for wheezing or shortness of breath. 3 Inhaler 12  . blood glucose meter kit and supplies KIT Dispense based on patient and insurance preference. Use up to four times daily as directed. (FOR ICD-9 250.00, 250.01). 1 each 0  . Blood Glucose Monitoring Suppl (TRUE METRIX METER) w/Device KIT USE AS INSTRUCTED 1 kit 0  . DULoxetine (CYMBALTA) 60 MG capsule TAKE 1 CAPSULE (60 MG TOTAL) BY MOUTH DAILY. 30 capsule 2  . ferrous sulfate 325 (65 FE) MG tablet Take 1 tablet (325 mg total) by  mouth 2 (two) times daily with a meal. (Patient not taking: Reported on 12/03/2019) 120 tablet 2  . gabapentin (NEURONTIN) 300 MG capsule Take 1 capsule (300 mg total) by mouth 2 (two) times daily. (Patient not taking: Reported on 12/03/2019) 60 capsule 6  . glucose blood (TRUE METRIX BLOOD GLUCOSE TEST) test strip Use as instructed 100 each 12  . lisinopril (ZESTRIL) 10 MG tablet Take 1 tablet (10 mg total) by mouth daily. Must have office visit for refills 30 tablet 0  . megestrol (MEGACE) 40 MG tablet TAKE 1 TABLET (40 MG TOTAL) BY MOUTH 2 (TWO) TIMES DAILY. 60 tablet 0  . meloxicam (MOBIC) 15 MG tablet TAKE 1 TABLET (15 MG TOTAL) BY MOUTH DAILY. 30 tablet 2  . metFORMIN (GLUCOPHAGE) 500 MG tablet Take 1 tablet (500 mg total) by mouth 2 (two) times daily. 60 tablet 5  . metroNIDAZOLE (FLAGYL) 500 MG tablet Take 1 tablet (500 mg total) by mouth 2 (two) times daily. (Patient not taking: Reported on 12/03/2019) 14 tablet 0  . mometasone-formoterol (DULERA) 100-5 MCG/ACT AERO Inhale 2 puffs into the lungs 2 (two) times daily. (Patient taking differently: Inhale 2 puffs into the lungs 2 (two) times daily as needed for wheezing or shortness of breath. ) 13 g 12  . nystatin-triamcinolone (MYCOLOG II) cream Apply 1 application topically 2 (two) times daily. (Patient taking differently: Apply 1 application topically 2 (two) times daily as needed (for rashes). ) 60 g 0  . simethicone (MYLICON) 80 MG chewable tablet Chew 80-160 mg by mouth every 6 (six) hours as needed for flatulence.    . traZODone (DESYREL) 100 MG tablet Take 1 tablet (100 mg total) by mouth at bedtime as needed for sleep. (Patient taking differently: Take 100 mg by mouth at bedtime. ) 30 tablet 6  . TRUEPLUS LANCETS 28G MISC Check blood sugar TID & QHS 100 each 2  . Vitamin D, Ergocalciferol, (DRISDOL) 1.25 MG (50000 UT) CAPS capsule Take 1 capsule (50,000 Units total) by mouth every 7 (seven) days. 16 capsule 0   No current  facility-administered medications on file prior to visit.    No Known Allergies  Social History   Socioeconomic History  . Marital status: Widowed    Spouse name: Not on file  . Number of children: Not on file  . Years of education: Not on file  . Highest education level: Not on file  Occupational History  . Not on file  Tobacco Use  . Smoking status: Former Smoker    Packs/day: 0.25    Years: 20.00    Pack years: 5.00    Types: Cigarettes    Quit date: 01/18/2015    Years since quitting: 5.1  . Smokeless tobacco: Never Used  Substance and Sexual Activity  . Alcohol use: Yes    Comment: 01/20/2015 "  dank some alcohol on my birthday, cookouts,  etc,;  maybe once/month"  . Drug use: No  . Sexual activity: Not Currently    Birth control/protection: None  Other Topics Concern  . Not on file  Social History Narrative  . Not on file   Social Determinants of Health   Financial Resource Strain:   . Difficulty of Paying Living Expenses:   Food Insecurity:   . Worried About Charity fundraiser in the Last Year:   . Arboriculturist in the Last Year:   Transportation Needs:   . Film/video editor (Medical):   Marland Kitchen Lack of Transportation (Non-Medical):   Physical Activity:   . Days of Exercise per Week:   . Minutes of Exercise per Session:   Stress:   . Feeling of Stress :   Social Connections:   . Frequency of Communication with Friends and Family:   . Frequency of Social Gatherings with Friends and Family:   . Attends Religious Services:   . Active Member of Clubs or Organizations:   . Attends Archivist Meetings:   Marland Kitchen Marital Status:   Intimate Partner Violence:   . Fear of Current or Ex-Partner:   . Emotionally Abused:   Marland Kitchen Physically Abused:   . Sexually Abused:     Family History  Problem Relation Age of Onset  . Asthma Mother   . Asthma Daughter     Past Surgical History:  Procedure Laterality Date  . CESAREAN SECTION  1992    ROS: Review of  Systems Negative except as stated above  PHYSICAL EXAM: BP 125/79   Pulse 66   Temp (!) 97.5 F (36.4 C)   Resp 16   Wt (!) 320 lb 6.4 oz (145.3 kg)   SpO2 98%   BMI 55.87 kg/m   Wt Readings from Last 3 Encounters:  02/26/20 (!) 320 lb 6.4 oz (145.3 kg)  12/03/19 (!) 341 lb 11.4 oz (155 kg)  07/24/19 (!) 340 lb 12.8 oz (154.6 kg)    Physical Exam  General appearance - alert, well appearing, and in no distress Mental status - normal mood, behavior, speech, dress, motor activity, and thought processes Eyes - pupils equal and reactive, extraocular eye movements intact Neck - supple, no significant adenopathy Chest - clear to auscultation, no wheezes, rales or rhonchi, symmetric air entry Heart - normal rate, regular rhythm, normal S1, S2, no murmurs, rubs, clicks or gallops Extremities - peripheral pulses normal, no pedal edema, no clubbing or cyanosis   Depression screen [facility] 2/9 02/26/2020 09/23/2019 07/24/2019  Decreased Interest 1 0 0  Down, Depressed, Hopeless 0 0 0  PHQ - 2 Score 1 0 0  Altered sleeping - - -  Tired, decreased energy - - -  Change in appetite - - -  Feeling bad or failure about yourself  - - -  Trouble concentrating - - -  Moving slowly or fidgety/restless - - -  Suicidal thoughts - - -  PHQ-9 Score - - -  Some recent data might be hidden    CMP Latest Ref Rng & Units 12/03/2019 03/18/2019 11/18/2017  Glucose 70 - 99 mg/dL 88 82 76  BUN 6 - 20 mg/dL _0 Creatinine 0.44 - 1.00 mg/dL 0.93 0.87 0.87  Sodium 135 - 145 mmol/L 142 143 142  Potassium 3.5 - 5.1 mmol/L 3.9 4.0 4.8  Chloride 98 - 111 mmol/L 108 105 103  CO2 22 - 32 mmol/L  _0 Calcium 8.9 - 10.3 mg/dL 9.2 9.5 9.2  Total Protein 6.0 - 8.5 g/dL - 6.9 6.6  Total Bilirubin 0.0 - 1.2 mg/dL - <0.2 0.2  Alkaline Phos 39 - 117 IU/L - 91 95  AST 0 - 40 IU/L - 16 10  ALT 0 - 32 IU/L - 20 12   Lipid Panel     Component Value Date/Time   CHOL 132 03/18/2019 1335   TRIG 67 03/18/2019  1335   HDL 40 03/18/2019 1335   CHOLHDL 3.3 03/18/2019 1335   CHOLHDL 3.4 01/20/2015 2241   VLDL 15 01/20/2015 2241   LDLCALC 79 03/18/2019 1335    CBC    Component Value Date/Time   WBC 7.0 12/03/2019 1302   RBC 4.65 12/03/2019 1302   HGB 11.6 (L) 12/03/2019 1302   HGB 11.0 (L) 09/25/2019 1136   HCT 38.8 12/03/2019 1302   HCT 35.6 09/25/2019 1136   PLT 439 (H) 12/03/2019 1302   PLT 399 09/25/2019 1136   MCV 83.4 12/03/2019 1302   MCV 84 09/25/2019 1136   MCH 24.9 (L) 12/03/2019 1302   MCHC 29.9 (L) 12/03/2019 1302   RDW 15.2 12/03/2019 1302   RDW 14.3 09/25/2019 1136   LYMPHSABS 2.1 09/25/2019 1136   MONOABS 516 12/20/2016 1048   EOSABS 0.1 09/25/2019 1136   BASOSABS 0.0 09/25/2019 1136    ASSESSMENT AND PLAN: 1. Primary osteoarthritis of left knee Commended her on weight loss and encouraged her to keep it up. - DULoxetine (CYMBALTA) 60 MG capsule; Take 1 capsule (60 mg total) by mouth daily.  Dispense: 30 capsule; Refill: 2 - meloxicam (MOBIC) 15 MG tablet; Take 1 tablet (15 mg total) by mouth daily.  Dispense: 30 tablet; Refill: 2  2. Essential hypertension Controlled - lisinopril (ZESTRIL) 10 MG tablet; Take 1 tablet (10 mg total) by mouth daily.  Dispense: 30 tablet; Refill: 0  3. DUB (dysfunctional uterine bleeding) Refill given on Megace - megestrol (MEGACE) 40 MG tablet; Take 1 tablet (40 mg total) by mouth 2 (two) times daily.  Dispense: 60 tablet; Refill: 0  4. Type 2 diabetes, controlled, with neuropathy ([place]) 5. Morbid obesity ([place]) DM Controlled.  Commended her on weight loss.  I have told her to look for the flavored water that has 0-calorie.  Try to move as much as she can. - metFORMIN (GLUCOPHAGE) 500 MG tablet; Take 1 tablet (500 mg total) by mouth 2 (two) times daily.  Dispense: 60 tablet; Refill: 5 - POCT glucose (manual entry) - POCT glycosylated hemoglobin (Hb A1C)  6. Muscle cramp - Basic Metabolic Panel - cyclobenzaprine (FLEXERIL) 10 MG  tablet; Take 1 tablet (10 mg total) by mouth daily as needed for muscle spasms.  Dispense: 30 tablet; Refill: 1  7. Pruritic intertrigo We will have her take Diflucan once a month - fluconazole (DIFLUCAN) 150 MG tablet; One tab PO q month  Dispense: 1 tablet; Refill: 6  8. Grief reaction Patient declines any counseling.  She reports good support from family and friends     Patient was given the opportunity to ask questions.  Patient verbalized understanding of the plan and was able to repeat key elements of the plan.   No orders of the defined types were placed in this encounter.    Requested Prescriptions   Pending Prescriptions Disp Refills  . DULoxetine (CYMBALTA) 60 MG capsule 30 capsule 2    Sig: Take 1 capsule (60 mg total) by mouth daily.  Marland Kitchen  lisinopril (ZESTRIL) 10 MG tablet 30 tablet 0    Sig: Take 1 tablet (10 mg total) by mouth daily.  . megestrol (MEGACE) 40 MG tablet 60 tablet 0    Sig: Take 1 tablet (40 mg total) by mouth 2 (two) times daily.  . meloxicam (MOBIC) 15 MG tablet 30 tablet 2    Sig: Take 1 tablet (15 mg total) by mouth daily.  . metFORMIN (GLUCOPHAGE) 500 MG tablet 60 tablet 5    Sig: Take 1 tablet (500 mg total) by mouth 2 (two) times daily.    No follow-ups on file.  Karle Plumber, MD, FACP

## 2020-02-27 ENCOUNTER — Telehealth: Payer: Self-pay

## 2020-02-27 LAB — BASIC METABOLIC PANEL
BUN/Creatinine Ratio: 12 (ref 9–23)
BUN: 11 mg/dL (ref 6–24)
CO2: 23 mmol/L (ref 20–29)
Calcium: 9.3 mg/dL (ref 8.7–10.2)
Chloride: 107 mmol/L — ABNORMAL HIGH (ref 96–106)
Creatinine, Ser: 0.89 mg/dL (ref 0.57–1.00)
GFR calc Af Amer: 89 mL/min/{1.73_m2} (ref >59)
GFR calc non Af Amer: 77 mL/min/{1.73_m2} (ref >59)
Glucose: 75 mg/dL (ref 65–99)
Potassium: 4.2 mmol/L (ref 3.5–5.2)
Sodium: 144 mmol/L (ref 134–144)

## 2020-02-27 NOTE — Telephone Encounter (Signed)
-----   Message from Ladell Pier, MD sent at 02/27/2020  9:03 AM EDT ----- Kidney function is good.  Electrolytes are normal.

## 2020-02-27 NOTE — Telephone Encounter (Signed)
Contacted pt to go over lab results pt didn't answer left a detailed vm informing pt of results and if she has any questions or concerns to give a call  

## 2020-04-04 ENCOUNTER — Other Ambulatory Visit: Payer: Self-pay

## 2020-04-04 ENCOUNTER — Ambulatory Visit: Payer: Self-pay | Attending: Internal Medicine | Admitting: Family

## 2020-04-04 DIAGNOSIS — H938X1 Other specified disorders of right ear: Secondary | ICD-10-CM

## 2020-04-04 MED ORDER — DEBROX 6.5 % OT SOLN: 5.0000 [drp] | Freq: Two times a day (BID) | OTIC | 0 refills | Status: DC

## 2020-04-04 NOTE — Patient Instructions (Signed)
Debrox for right clogged ear. Follow-up in 4 days for in office visit or sooner if needed. Earwax Buildup, Adult The ears produce a substance called earwax that helps keep bacteria out of the ear and protects the skin in the ear canal. Occasionally, earwax can build up in the ear and cause discomfort or hearing loss. What increases the risk? This condition is more likely to develop in people who:  Are female.  Are elderly.  Naturally produce more earwax.  Clean their ears often with cotton swabs.  Use earplugs often.  Use in-ear headphones often.  Wear hearing aids.  Have narrow ear canals.  Have earwax that is overly thick or sticky.  Have eczema.  Are dehydrated.  Have excess hair in the ear canal. What are the signs or symptoms? Symptoms of this condition include:  Reduced or muffled hearing.  A feeling of fullness in the ear or feeling that the ear is plugged.  Fluid coming from the ear.  Ear pain.  Ear itch.  Ringing in the ear.  Coughing.  An obvious piece of earwax that can be seen inside the ear canal. How is this diagnosed? This condition may be diagnosed based on:  Your symptoms.  Your medical history.  An ear exam. During the exam, your health care provider will look into your ear with an instrument called an otoscope. You may have tests, including a hearing test. How is this treated? This condition may be treated by:  Using ear drops to soften the earwax.  Having the earwax removed by a health care provider. The health care provider may: ? Flush the ear with water. ? Use an instrument that has a loop on the end (curette). ? Use a suction device.  Surgery to remove the wax buildup. This may be done in severe cases. Follow these instructions at home:   Take over-the-counter and prescription medicines only as told by your health care provider.  Do not put any objects, including cotton swabs, into your ear. You can clean the opening of  your ear canal with a washcloth or facial tissue.  Follow instructions from your health care provider about cleaning your ears. Do not over-clean your ears.  Drink enough fluid to keep your urine clear or pale yellow. This will help to thin the earwax.  Keep all follow-up visits as told by your health care provider. If earwax builds up in your ears often or if you use hearing aids, consider seeing your health care provider for routine, preventive ear cleanings. Ask your health care provider how often you should schedule your cleanings.  If you have hearing aids, clean them according to instructions from the manufacturer and your health care provider. Contact a health care provider if:  You have ear pain.  You develop a fever.  You have blood, pus, or other fluid coming from your ear.  You have hearing loss.  You have ringing in your ears that does not go away.  Your symptoms do not improve with treatment.  You feel like the room is spinning (vertigo). Summary  Earwax can build up in the ear and cause discomfort or hearing loss.  The most common symptoms of this condition include reduced or muffled hearing and a feeling of fullness in the ear or feeling that the ear is plugged.  This condition may be diagnosed based on your symptoms, your medical history, and an ear exam.  This condition may be treated by using ear drops to soften the  earwax or by having the earwax removed by a health care provider.  Do not put any objects, including cotton swabs, into your ear. You can clean the opening of your ear canal with a washcloth or facial tissue. This information is not intended to replace advice given to you by your health care provider. Make sure you discuss any questions you have with your health care provider. Document Revised: 09/06/2017 Document Reviewed: 12/05/2016 Elsevier Patient Education  2020 [employer].

## 2020-04-04 NOTE — Progress Notes (Signed)
Virtual Visit via Telephone Note  I connected with Hannah Baldwin, on 04/04/2020 at 5:05 PM by telephone due to the COVID-19 pandemic and verified that I am speaking with the correct person using two identifiers.  Due to current restrictions/limitations of in-office visits due to the COVID-19 pandemic, this scheduled clinical appointment was converted to a telehealth visit.   Consent: I discussed the limitations, risks, security and privacy concerns of performing an evaluation and management service by telephone and the availability of in person appointments. I also discussed with the patient that there may be a patient responsible charge related to this service. The patient expressed understanding and agreed to proceed.  Location of Patient: Home  Location of Provider: [facility] [place]  Persons participating in Telemedicine visit: Cherisse Carrell, NP Orlan Leavens, [place]  History of Present Illness: Hannah Baldwin is a 49 year old female with history of cardiomegaly, essential hypertension, moderate persistent asthma, diabetes type 2, diabetic neuropathy, and severe obesity BMI > = 40 who presents today for ear pain.   1. EAR PAIN: Duration: days Involved ear(s): right Severity:  reports ear is not hurting  Quality:  pressure-like Fever: no Otorrhea: no Upper respiratory infection symptoms: no, sometimes occasional allergy symptoms  Pruritus: yes Hearing loss: yes Water immersion no Using Q-tips: yes, seldom Recurrent otitis media: no Status: stable Treatments attempted: poured Peroxide in ear and reports it bubbled up Denies dizziness, headaches, and ringing sensation in the ear.  Past Medical History:  Diagnosis Date  . Anemia   . Asthma Dx 2014  . Diabetes mellitus without complication ([place])   . 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 Pneumonia ~ 2000 X 1   No Known Allergies  Current Outpatient Medications  on File Prior to Visit  Medication Sig Dispense Refill  . acetaminophen (TYLENOL) 325 MG tablet Take 2 tablets (650 mg total) by mouth every 8 (eight) hours. (Patient taking differently: Take 650 mg by mouth every 8 (eight) hours as needed for mild pain or headache. ) 60 tablet 4  . albuterol (PROVENTIL) (2.5 MG/3ML) 0.083% nebulizer solution Take 3 mLs (2.5 mg total) by nebulization every 6 (six) hours as needed for wheezing or shortness of breath. 75 mL 12  . albuterol (VENTOLIN HFA) 108 (90 Base) MCG/ACT inhaler Inhale 2 puffs into the lungs every 6 (six) hours as needed for wheezing or shortness of breath. 3 Inhaler 12  . blood glucose meter kit and supplies KIT Dispense based on patient and insurance preference. Use up to four times daily as directed. (FOR ICD-9 250.00, 250.01). 1 each 0  . Blood Glucose Monitoring Suppl (TRUE METRIX METER) w/Device KIT USE AS INSTRUCTED 1 kit 0  . cyclobenzaprine (FLEXERIL) 10 MG tablet Take 1 tablet (10 mg total) by mouth daily as needed for muscle spasms. 30 tablet 1  . DULoxetine (CYMBALTA) 60 MG capsule Take 1 capsule (60 mg total) by mouth daily. 30 capsule 2  . ferrous sulfate 325 (65 FE) MG tablet Take 1 tablet (325 mg total) by mouth 2 (two) times daily with a meal. (Patient not taking: Reported on 12/03/2019) 120 tablet 2  . fluconazole (DIFLUCAN) 150 MG tablet One tab PO q month (Patient not taking: Reported on 04/04/2020) 1 tablet 6  . gabapentin (NEURONTIN) 300 MG capsule Take 1 capsule (300 mg total) by mouth 2 (two) times daily. (Patient not taking: Reported on 12/03/2019) 60 capsule  6  . glucose blood (TRUE METRIX BLOOD GLUCOSE TEST) test strip Use as instructed 100 each 12  . lisinopril (ZESTRIL) 10 MG tablet Take 1 tablet (10 mg total) by mouth daily. 30 tablet 0  . megestrol (MEGACE) 40 MG tablet Take 1 tablet (40 mg total) by mouth 2 (two) times daily. 60 tablet 0  . meloxicam (MOBIC) 15 MG tablet Take 1 tablet (15 mg total) by mouth daily. 30  tablet 2  . metFORMIN (GLUCOPHAGE) 500 MG tablet Take 1 tablet (500 mg total) by mouth 2 (two) times daily. 60 tablet 5  . mometasone-formoterol (DULERA) 100-5 MCG/ACT AERO Inhale 2 puffs into the lungs 2 (two) times daily. (Patient taking differently: Inhale 2 puffs into the lungs 2 (two) times daily as needed for wheezing or shortness of breath. ) 13 g 12  . nystatin-triamcinolone (MYCOLOG II) cream Apply 1 application topically 2 (two) times daily. (Patient taking differently: Apply 1 application topically 2 (two) times daily as needed (for rashes). ) 60 g 0  . simethicone (MYLICON) 80 MG chewable tablet Chew 80-160 mg by mouth every 6 (six) hours as needed for flatulence.    . traZODone (DESYREL) 100 MG tablet Take 1 tablet (100 mg total) by mouth at bedtime as needed for sleep. (Patient taking differently: Take 100 mg by mouth at bedtime. ) 30 tablet 6  . TRUEPLUS LANCETS 28G MISC Check blood sugar TID & QHS 100 each 2  . Vitamin D, Ergocalciferol, (DRISDOL) 1.25 MG (50000 UT) CAPS capsule Take 1 capsule (50,000 Units total) by mouth every 7 (seven) days. 16 capsule 0   No current facility-administered medications on file prior to visit.    Observations/Objective: Alert and oriented x 3. Not in acute distress. Physical examination not completed as this is a telemedicine visit.  Assessment and Plan: 1. Clogged ear, right: -Patient with sensation of right ear being clogged which began 2 days ago. Reports decreased hearing in the same ear. Denies pain, dizziness, headaches, drainage, and ringing sound in ear.  -Will try course of Carbamide Peroxide to see if this helps. Patient to schedule time to come into the office in about 4 days for assessment and further evaluation. -Patient may need referral to ENT if this continues and or worsens. - carbamide peroxide (DEBROX) 6.5 % OTIC solution; Place 5 drops into the right ear 2 (two) times daily.  Dispense: 15 mL; Refill: 0   Follow Up  Instructions: Follow-up in 4 days or sooner if needed.    Patient was given clear instructions to go to Emergency Department or return to medical center if symptoms don't improve, worsen, or new problems develop.The patient verbalized understanding.  I discussed the assessment and treatment plan with the patient. The patient was provided an opportunity to ask questions and all were answered. The patient agreed with the plan and demonstrated an understanding of the instructions.   The patient was advised to call back or seek an in-person evaluation if the symptoms worsen or if the condition fails to improve as anticipated.   I provided 13 minutes total of non-face-to-face time during this encounter including median intraservice time, reviewing previous notes, labs, imaging, medications, management and patient verbalized understanding.    Camillia Herter, NP  [facility] and [facility], [place]   04/04/2020, 5:05 PM

## 2020-04-05 MED FILL — DULoxetine HCL 60 MG CPEP: 60 | 30 days supply | Qty: 30 | Fill #1

## 2020-04-05 MED FILL — MELOXICAM 15 MG TABLET: 15 | 30 days supply | Qty: 30 | Fill #1

## 2020-04-05 MED FILL — FLUCONAZOLE 150 MG TABLET: 150 | 30 days supply | Qty: 1 | Fill #1

## 2020-05-09 ENCOUNTER — Other Ambulatory Visit: Payer: Self-pay | Admitting: Internal Medicine

## 2020-05-09 DIAGNOSIS — I10 Essential (primary) hypertension: Secondary | ICD-10-CM

## 2020-05-09 DIAGNOSIS — N938 Other specified abnormal uterine and vaginal bleeding: Secondary | ICD-10-CM

## 2020-05-09 MED FILL — MEGESTROL 40 MG TABLET: 40 | 30 days supply | Qty: 60 | Fill #0

## 2020-05-09 MED FILL — FLUCONAZOLE 150 MG TABLET: 150 | 30 days supply | Qty: 1 | Fill #2

## 2020-05-09 MED FILL — LISINOPRIL 10 MG TABS: 10 | 30 days supply | Qty: 30 | Fill #0

## 2020-05-09 MED FILL — DULoxetine HCL 60 MG CPEP: 60 | 30 days supply | Qty: 30 | Fill #2

## 2020-05-09 MED FILL — CYCLOBENZAPRINE 10 MG TAB: 10 | 30 days supply | Qty: 30 | Fill #1

## 2020-05-09 MED FILL — MELOXICAM 15 MG TABLET: 15 | 30 days supply | Qty: 30 | Fill #2

## 2020-05-17 MED FILL — MEGESTROL 40 MG TABLET: 40 | 30 days supply | Qty: 60 | Fill #0

## 2020-05-18 MED FILL — MELOXICAM 15 MG TABLET: 15 | 30 days supply | Qty: 30 | Fill #2

## 2020-05-18 MED FILL — FLUCONAZOLE 150 MG TABLET: 150 | 30 days supply | Qty: 1 | Fill #2

## 2020-05-18 MED FILL — DULoxetine HCL 60 MG CPEP: 60 | 30 days supply | Qty: 30 | Fill #2

## 2020-05-18 MED FILL — CYCLOBENZAPRINE 10 MG TAB: 10 | 30 days supply | Qty: 30 | Fill #1

## 2020-05-18 MED FILL — LISINOPRIL 10 MG TABS: 10 | 30 days supply | Qty: 30 | Fill #0

## 2020-06-14 MED FILL — DULoxetine HCL 60 MG CPEP: 60 | 30 days supply | Qty: 30 | Fill #2

## 2020-06-14 MED FILL — MELOXICAM 15 MG TABLET: 15 | 30 days supply | Qty: 30 | Fill #2

## 2020-06-14 MED FILL — CYCLOBENZAPRINE 10 MG TAB: 10 | 30 days supply | Qty: 30 | Fill #1

## 2020-06-14 MED FILL — LISINOPRIL 10 MG TABS: 10 | 30 days supply | Qty: 30 | Fill #0

## 2020-06-14 MED FILL — FLUCONAZOLE 150 MG TABLET: 150 | 30 days supply | Qty: 1 | Fill #2

## 2020-06-28 ENCOUNTER — Other Ambulatory Visit: Payer: Self-pay | Admitting: Internal Medicine

## 2020-06-28 ENCOUNTER — Ambulatory Visit: Payer: Self-pay | Attending: Internal Medicine | Admitting: Internal Medicine

## 2020-06-28 DIAGNOSIS — J4540 Moderate persistent asthma, uncomplicated: Secondary | ICD-10-CM

## 2020-06-28 DIAGNOSIS — E1140 Type 2 diabetes mellitus with diabetic neuropathy, unspecified: Secondary | ICD-10-CM

## 2020-06-28 DIAGNOSIS — Z2821 Immunization not carried out because of patient refusal: Secondary | ICD-10-CM

## 2020-06-28 DIAGNOSIS — R252 Cramp and spasm: Secondary | ICD-10-CM

## 2020-06-28 DIAGNOSIS — N938 Other specified abnormal uterine and vaginal bleeding: Secondary | ICD-10-CM

## 2020-06-28 MED ORDER — DULERA 100-5 MCG/ACT IN AERO: 2.0000 | INHALATION_SPRAY | Freq: Two times a day (BID) | RESPIRATORY_TRACT | 12 refills | Status: DC

## 2020-06-28 MED ORDER — MEGESTROL ACETATE 40 MG PO TABS: 40.0000 mg | ORAL_TABLET | Freq: Two times a day (BID) | ORAL | 4 refills | Status: DC

## 2020-06-28 MED ORDER — CYCLOBENZAPRINE HCL 10 MG PO TABS: 10.0000 mg | ORAL_TABLET | Freq: Two times a day (BID) | ORAL | 4 refills | Status: DC | PRN

## 2020-06-28 MED ORDER — ALBUTEROL SULFATE HFA 108 (90 BASE) MCG/ACT IN AERS: 2.0000 | INHALATION_SPRAY | Freq: Four times a day (QID) | RESPIRATORY_TRACT | 6 refills | Status: DC | PRN

## 2020-06-28 NOTE — Progress Notes (Signed)
Pt states her right leg cramps up and she has to take muscle relaxer to help

## 2020-06-28 NOTE — Progress Notes (Signed)
Virtual Visit via Telephone Note Due to current restrictions/limitations of in-office visits due to the COVID-19 pandemic, this scheduled clinical appointment was converted to a telehealth visit  I connected with [name] on 06/28/20 at 1:38 p.m by telephone and verified that I am speaking with the correct person using two identifiers. I am in my office.  The patient is at home.  Only the patient and myself participated in this encounter.  I discussed the limitations, risks, security and privacy concerns of performing an evaluation and management service by telephone and the availability of in person appointments. I also discussed with the patient that there may be a patient responsible charge related to this service. The patient expressed understanding and agreed to proceed.   History of Present Illness: Patient with history ofDM2 with neuropathy, obesity, mod persistent asthma, osteoarthritis of the left knee,iron deficiency anemia,DUB with fibroid, opioidaddictionpreviously on Methadone.  Last seen 02/2020.   Still gets cramps in RT leg. Rxn Flexeril on last visit to take one tab daily PRN.  She reports that most days she has to take BID. Request increase dose.  DUB/fibroid:  Requests RF on Megace.  Takes 40 mg BID.  Gets heavy bleeding if she does not take the Megace.  Has not seen GYN in a while due to lack of insurance.   DIABETES TYPE 2/Obesity Last A1C:   Lab Results  Component Value Date   HGBA1C 5.4 02/26/2020   Med Adherence:  '[x]'  Yes on Metformin.     Medication side effects:  '[]'  Yes    '[x]'  No Home Monitoring?  '[x]'  Yes -once a day Home glucose results range: in the 80s Diet Adherence: '[x]'  Yes    '[]'  No Exercise: She tries to move as much as her knees will allow. Hypoglycemic episodes?: '[]'  Yes    '[]'  No Numbness of the feet? '[]'  Yes    '[]'  No Retinopathy hx? '[]'  Yes    '[x]'  No Last eye exam:  Over due for eye exam but no insurance Comments:    Asthma:  Slight flare  due to being out of Dulera and albuterol inhalers.  She does not want the flu shot.  Outpatient Encounter Medications as of 06/28/2020  Medication Sig  . acetaminophen (TYLENOL) 325 MG tablet Take 2 tablets (650 mg total) by mouth every 8 (eight) hours. (Patient taking differently: Take 650 mg by mouth every 8 (eight) hours as needed for mild pain or headache. )  . albuterol (PROVENTIL) (2.5 MG/3ML) 0.083% nebulizer solution Take 3 mLs (2.5 mg total) by nebulization every 6 (six) hours as needed for wheezing or shortness of breath.  Marland Kitchen albuterol (VENTOLIN HFA) 108 (90 Base) MCG/ACT inhaler Inhale 2 puffs into the lungs every 6 (six) hours as needed for wheezing or shortness of breath.  . blood glucose meter kit and supplies KIT Dispense based on patient and insurance preference. Use up to four times daily as directed. (FOR ICD-9 250.00, 250.01).  . Blood Glucose Monitoring Suppl (TRUE METRIX METER) w/Device KIT USE AS INSTRUCTED  . carbamide peroxide (DEBROX) 6.5 % OTIC solution Place 5 drops into the right ear 2 (two) times daily.  . cyclobenzaprine (FLEXERIL) 10 MG tablet Take 1 tablet (10 mg total) by mouth daily as needed for muscle spasms.  . DULoxetine (CYMBALTA) 60 MG capsule Take 1 capsule (60 mg total) by mouth daily.  . ferrous sulfate 325 (65 FE) MG tablet Take 1 tablet (325 mg total) by mouth 2 (two) times  daily with a meal. (Patient not taking: Reported on 12/03/2019)  . fluconazole (DIFLUCAN) 150 MG tablet One tab PO q month (Patient not taking: Reported on 04/04/2020)  . gabapentin (NEURONTIN) 300 MG capsule Take 1 capsule (300 mg total) by mouth 2 (two) times daily. (Patient not taking: Reported on 12/03/2019)  . glucose blood (TRUE METRIX BLOOD GLUCOSE TEST) test strip Use as instructed  . lisinopril (ZESTRIL) 10 MG tablet TAKE 1 TABLET (10 MG TOTAL) BY MOUTH DAILY.  . megestrol (MEGACE) 40 MG tablet TAKE 1 TABLET (40 MG TOTAL) BY MOUTH 2 (TWO) TIMES DAILY.  . meloxicam (MOBIC) 15 MG  tablet Take 1 tablet (15 mg total) by mouth daily.  . metFORMIN (GLUCOPHAGE) 500 MG tablet Take 1 tablet (500 mg total) by mouth 2 (two) times daily.  . mometasone-formoterol (DULERA) 100-5 MCG/ACT AERO Inhale 2 puffs into the lungs 2 (two) times daily. (Patient taking differently: Inhale 2 puffs into the lungs 2 (two) times daily as needed for wheezing or shortness of breath. )  . nystatin-triamcinolone (MYCOLOG II) cream Apply 1 application topically 2 (two) times daily. (Patient taking differently: Apply 1 application topically 2 (two) times daily as needed (for rashes). )  . simethicone (MYLICON) 80 MG chewable tablet Chew 80-160 mg by mouth every 6 (six) hours as needed for flatulence.  . traZODone (DESYREL) 100 MG tablet Take 1 tablet (100 mg total) by mouth at bedtime as needed for sleep. (Patient taking differently: Take 100 mg by mouth at bedtime. )  . TRUEPLUS LANCETS 28G MISC Check blood sugar TID & QHS  . Vitamin D, Ergocalciferol, (DRISDOL) 1.25 MG (50000 UT) CAPS capsule Take 1 capsule (50,000 Units total) by mouth every 7 (seven) days.   No facility-administered encounter medications on file as of 06/28/2020.      Observations/Objective: Results for orders placed or performed in visit on 65/78/46  Basic Metabolic Panel  Result Value Ref Range   Glucose 75 65 - 99 mg/dL   BUN 11 6 - 24 mg/dL   Creatinine, Ser 0.89 0.57 - 1.00 mg/dL   GFR calc non Af Amer 77 >59 mL/min/1.73   GFR calc Af Amer 89 >59 mL/min/1.73   BUN/Creatinine Ratio 12 9 - 23   Sodium 144 134 - 144 mmol/L   Potassium 4.2 3.5 - 5.2 mmol/L   Chloride 107 (H) 96 - 106 mmol/L   CO2 23 20 - 29 mmol/L   Calcium 9.3 8.7 - 10.2 mg/dL  POCT glucose (manual entry)  Result Value Ref Range   POC Glucose 83 70 - 99 mg/dl  POCT glycosylated hemoglobin (Hb A1C)  Result Value Ref Range   Hemoglobin A1C     HbA1c POC (<> result, manual entry) 5.4 4.0 - 5.6 %   HbA1c, POC (prediabetic range)     HbA1c, POC (controlled  diabetic range)       Assessment and Plan: 1. Type 2 diabetes, controlled, with neuropathy ([place]) Reported blood sugars are at goal.  Continue Metformin.  Encouraged her to continue trying to eat healthy and to move as much as she can.  2. Muscle cramp We will increase the Flexeril to 10 mg twice a day as needed. - cyclobenzaprine (FLEXERIL) 10 MG tablet; Take 1 tablet (10 mg total) by mouth 2 (two) times daily as needed for muscle spasms.  Dispense: 60 tablet; Refill: 4  3. Moderate persistent asthma without complication Refill sent on albuterol and Dulera. - albuterol (VENTOLIN HFA) 108 (90 Base) MCG/ACT  inhaler; Inhale 2 puffs into the lungs every 6 (six) hours as needed for wheezing or shortness of breath.  Dispense: 8 g; Refill: 6 - mometasone-formoterol (DULERA) 100-5 MCG/ACT AERO; Inhale 2 puffs into the lungs 2 (two) times daily.  Dispense: 13 g; Refill: 12  4. DUB (dysfunctional uterine bleeding) Advised patient to apply for the orange card/cone discount so that we can refer her to gynecology for follow-up.  In the meantime we will refill the Megace. - megestrol (MEGACE) 40 MG tablet; Take 1 tablet (40 mg total) by mouth 2 (two) times daily.  Dispense: 60 tablet; Refill: 4  5. Morbid obesity ([place]) See #1 above.  6. Influenza vaccination declined This was offered.  Patient declined.   Follow Up Instructions: 3-4 mths   I discussed the assessment and treatment plan with the patient. The patient was provided an opportunity to ask questions and all were answered. The patient agreed with the plan and demonstrated an understanding of the instructions.   The patient was advised to call back or seek an in-person evaluation if the symptoms worsen or if the condition fails to improve as anticipated.  I provided 8 minutes of non-face-to-face time during this encounter.   Karle Plumber, MD

## 2020-07-06 ENCOUNTER — Other Ambulatory Visit: Payer: Self-pay | Admitting: Internal Medicine

## 2020-07-06 DIAGNOSIS — G4700 Insomnia, unspecified: Secondary | ICD-10-CM

## 2020-07-06 MED FILL — MEGESTROL 40 MG TABLET: 40 | 30 days supply | Qty: 60 | Fill #0

## 2020-07-06 MED FILL — DULERA 100 MCG/5 MCG INH: 100-5 | 25 days supply | Qty: 13 | Fill #0

## 2020-07-06 MED FILL — ALBUTEROL SULFATE HFA 108 (: 108 (90 BAS | 25 days supply | Qty: 18 | Fill #0

## 2020-07-06 MED FILL — CYCLOBENZAPRINE 10 MG TAB: 10 | 30 days supply | Qty: 60 | Fill #0

## 2020-07-06 NOTE — Telephone Encounter (Signed)
Requested medication (s) are due for refill today: Yes  Requested medication (s) are on the active medication list: Yes  Last refill:  03/12/19  Future visit scheduled: Yes  Notes to clinic:  Prescription has expired.    Requested Prescriptions  Pending Prescriptions Disp Refills   traZODone (DESYREL) 100 MG tablet [Pharmacy Med Name: TRAZODONE HCL 100 MG TABS 100 Tablet] 30 tablet 6    Sig: Take 1 tablet (100 mg total) by mouth at bedtime as needed for sleep.      Psychiatry: Antidepressants - Serotonin Modulator Passed - 07/06/2020  2:20 PM      Passed - Valid encounter within last 6 months    Recent Outpatient Visits           1 week ago Type 2 diabetes, controlled, with neuropathy [facility])   [place], MD   3 months ago Clogged ear, right   [place], [place], NP   4 months ago Muscle cramp   [place], MD   9 months ago Mood swings   [place], [place]   11 months ago Type 2 diabetes, controlled, with neuropathy [facility])   [place], [place]       Future Appointments             In 2 months Wynetta Emery Dalbert Batman, MD [place]

## 2020-07-07 MED FILL — TRAZODONE HCL 100 MG TABS: 100 | 30 days supply | Qty: 30 | Fill #0

## 2020-08-29 MED FILL — MEGESTROL 40 MG TABLET: 40 | 30 days supply | Qty: 60 | Fill #1

## 2020-09-27 ENCOUNTER — Ambulatory Visit: Payer: Self-pay | Admitting: Internal Medicine

## 2020-10-13 MED FILL — DULoxetine HCL 60 MG CPEP: 60 | 30 days supply | Qty: 30 | Fill #2

## 2020-10-13 MED FILL — MELOXICAM 15 MG TABLET: 15 | 30 days supply | Qty: 30 | Fill #2

## 2020-10-13 MED FILL — MEGESTROL 40 MG TABLET: 40 | 30 days supply | Qty: 60 | Fill #2

## 2020-10-17 ENCOUNTER — Other Ambulatory Visit: Payer: Self-pay

## 2020-10-17 ENCOUNTER — Ambulatory Visit: Payer: Self-pay | Attending: Internal Medicine

## 2020-10-17 DIAGNOSIS — Z111 Encounter for screening for respiratory tuberculosis: Secondary | ICD-10-CM

## 2020-10-17 NOTE — Progress Notes (Signed)
Pt arrived at clinic to get PPD skin test for school. She was given test in left forearm. She was informed to return in 48-72 for reading.

## 2020-10-19 ENCOUNTER — Ambulatory Visit: Payer: Self-pay | Attending: Internal Medicine

## 2020-10-19 ENCOUNTER — Other Ambulatory Visit: Payer: Self-pay

## 2020-10-19 LAB — TB SKIN TEST
Induration: 0 mm
TB Skin Test: NEGATIVE

## 2020-10-28 ENCOUNTER — Ambulatory Visit: Payer: Self-pay | Admitting: Internal Medicine

## 2020-11-23 ENCOUNTER — Other Ambulatory Visit: Payer: Self-pay | Admitting: Internal Medicine

## 2020-11-23 DIAGNOSIS — M1712 Unilateral primary osteoarthritis, left knee: Secondary | ICD-10-CM

## 2020-11-23 MED FILL — MEGESTROL 40 MG TABLET: 40 | 30 days supply | Qty: 60 | Fill #3

## 2020-11-23 MED FILL — TRAZODONE HCL 100 MG TABS: 100 | 30 days supply | Qty: 30 | Fill #1

## 2020-11-23 MED FILL — MELOXICAM 15 MG TABLET: 15 | 30 days supply | Qty: 30 | Fill #0

## 2020-11-23 MED FILL — DULoxetine HCL 60 MG CPEP: 60 | 30 days supply | Qty: 30 | Fill #0

## 2020-11-23 MED FILL — CYCLOBENZAPRINE 10 MG TAB: 10 | 30 days supply | Qty: 60 | Fill #1

## 2021-01-07 ENCOUNTER — Other Ambulatory Visit: Payer: Self-pay

## 2021-01-17 ENCOUNTER — Other Ambulatory Visit: Payer: Self-pay

## 2021-01-17 MED FILL — Megestrol Acetate Tab 40 MG: ORAL | 30 days supply | Qty: 60 | Fill #0 | Status: AC

## 2021-01-23 ENCOUNTER — Other Ambulatory Visit: Payer: Self-pay

## 2021-03-02 ENCOUNTER — Other Ambulatory Visit: Payer: Self-pay | Admitting: Internal Medicine

## 2021-03-02 ENCOUNTER — Other Ambulatory Visit: Payer: Self-pay

## 2021-03-02 DIAGNOSIS — N938 Other specified abnormal uterine and vaginal bleeding: Secondary | ICD-10-CM

## 2021-03-02 DIAGNOSIS — L304 Erythema intertrigo: Secondary | ICD-10-CM

## 2021-03-02 MED ORDER — MEGESTROL ACETATE 40 MG PO TABS
ORAL_TABLET | Freq: Two times a day (BID) | ORAL | 4 refills | Status: DC
  Filled 2021-03-02: qty 60, 30d supply, fill #0
  Filled 2021-04-19: qty 60, 30d supply, fill #1
  Filled 2021-06-30: qty 60, 30d supply, fill #2
  Filled 2021-08-23: qty 60, 30d supply, fill #3
  Filled 2021-11-03: qty 60, 30d supply, fill #0

## 2021-03-02 MED ORDER — FLUCONAZOLE 150 MG PO TABS
ORAL_TABLET | ORAL | 6 refills | Status: DC
  Filled 2021-03-02: qty 1, 30d supply, fill #0
  Filled 2021-04-20: qty 1, 30d supply, fill #1

## 2021-03-02 MED FILL — Duloxetine HCl Enteric Coated Pellets Cap 60 MG (Base Eq): ORAL | 30 days supply | Qty: 30 | Fill #0 | Status: AC

## 2021-03-02 MED FILL — Trazodone HCl Tab 100 MG: ORAL | 30 days supply | Qty: 30 | Fill #0 | Status: AC

## 2021-03-02 MED FILL — Meloxicam Tab 15 MG: ORAL | 30 days supply | Qty: 30 | Fill #0 | Status: AC

## 2021-03-02 MED FILL — Cyclobenzaprine HCl Tab 10 MG: ORAL | 30 days supply | Qty: 60 | Fill #0 | Status: AC

## 2021-03-02 NOTE — Telephone Encounter (Signed)
Requested medication (s) are due for refill today: Yes  Requested medication (s) are on the active medication list: Yes  Last refill:  02/26/20  Future visit scheduled: Yes  Notes to clinic:  See request.    Requested Prescriptions  Pending Prescriptions Disp Refills   fluconazole (DIFLUCAN) 150 MG tablet 1 tablet 6    Sig: take one tablet by mouth once monthly      Off-Protocol Failed - 03/02/2021 11:35 AM      Failed - Medication not assigned to a protocol, review manually.      Passed - Valid encounter within last 12 months    Recent Outpatient Visits           8 months ago Type 2 diabetes, controlled, with neuropathy [facility])   [place] Ladell Pier, MD   11 months ago Stage manager, right   [place], [place], NP   1 year ago Muscle cramp   [place], Deborah B, MD   1 year ago Mood swings   [place], [place], [place]   1 year ago Type 2 diabetes, controlled, with neuropathy [facility])   [place] Ladell Pier, MD       Future Appointments             In 2 weeks Ladell Pier, MD [place]              Signed Prescriptions Disp Refills   megestrol (MEGACE) 40 MG tablet 60 tablet 4    Sig: TAKE 1 TABLET (40 MG TOTAL) BY MOUTH 2 (TWO) TIMES DAILY.      OB/GYN:  Progestins Passed - 03/02/2021 11:35 AM      Passed - Valid encounter within last 12 months    Recent Outpatient Visits           8 months ago Type 2 diabetes, controlled, with neuropathy [facility])   [place], [place]   11 months ago Stage manager, right   [place], [place], NP   1 year ago Muscle cramp   [place], Deborah B, MD   1 year ago Mood swings    [place], [place]   1 year ago Type 2 diabetes, controlled, with neuropathy [facility])   [place], MD       Future Appointments             In 2 weeks Ladell Pier, MD [place]

## 2021-03-03 ENCOUNTER — Other Ambulatory Visit: Payer: Self-pay

## 2021-03-17 ENCOUNTER — Ambulatory Visit: Payer: Self-pay | Admitting: Internal Medicine

## 2021-03-18 ENCOUNTER — Emergency Department (HOSPITAL_COMMUNITY): Payer: Self-pay

## 2021-03-18 ENCOUNTER — Other Ambulatory Visit: Payer: Self-pay

## 2021-03-18 ENCOUNTER — Emergency Department (HOSPITAL_COMMUNITY)
Admission: EM | Admit: 2021-03-18 | Discharge: 2021-03-19 | Disposition: A | Discharge status: Home / Self Care | Payer: Self-pay | Attending: Emergency Medicine | Admitting: Emergency Medicine

## 2021-03-18 DIAGNOSIS — J45909 Unspecified asthma, uncomplicated: Secondary | ICD-10-CM | POA: Insufficient documentation

## 2021-03-18 DIAGNOSIS — E1140 Type 2 diabetes mellitus with diabetic neuropathy, unspecified: Secondary | ICD-10-CM | POA: Insufficient documentation

## 2021-03-18 DIAGNOSIS — Z79899 Other long term (current) drug therapy: Secondary | ICD-10-CM | POA: Insufficient documentation

## 2021-03-18 DIAGNOSIS — I10 Essential (primary) hypertension: Secondary | ICD-10-CM | POA: Insufficient documentation

## 2021-03-18 DIAGNOSIS — M545 Low back pain, unspecified: Secondary | ICD-10-CM | POA: Insufficient documentation

## 2021-03-18 DIAGNOSIS — Z87891 Personal history of nicotine dependence: Secondary | ICD-10-CM | POA: Insufficient documentation

## 2021-03-18 NOTE — ED Triage Notes (Signed)
Pt reports lower back pain R>L x 2 weeks. Sts she normally is able to take OTC meds for same with relief, but same meds are not effective today. Denies injury.

## 2021-03-18 NOTE — ED Provider Notes (Signed)
Emergency Medicine Provider Triage Evaluation Note  Hannah Baldwin , a 50 y.o. female  was evaluated in triage.  Pt complains of low back pain x 2 week. No injury. No hx of IVDU, saddle anesthesia,  bowel or bladder incontinence. No urinary complaints. Denies chance of pregnancy. No fever, chill, ccp, abd pain, numbness. Pain radiates down Bl legs R>L  Review of Systems  Positive: Back pain Negative: Numbness, weakness, urinary complaints  Physical Exam  BP 139/84 (BP Location: Right Arm)   Pulse (!) 110   Temp 98.6 F (37 C) (Oral)   Resp 19   SpO2 100%  Gen:   Awake, no distress   Resp:  Normal effort  MSK:   Moves extremities without difficulty. Diffuse BL tenderness including midline tenderness Other:    Medical Decision Making  Medically screening exam initiated at 4:50 PM.  Appropriate orders placed.  Hannah Baldwin was informed that the remainder of the evaluation will be completed by another provider, this initial triage assessment does not replace that evaluation, and the importance of remaining in the ED until their evaluation is complete.  Low back pain   Vraj Denardo A, PA-C 03/18/21 1652    Davonna Belling, MD 03/18/21 [id number]

## 2021-03-19 ENCOUNTER — Encounter (HOSPITAL_COMMUNITY): Payer: Self-pay | Admitting: Student

## 2021-03-19 LAB — I-STAT BETA HCG BLOOD, ED (MC, WL, AP ONLY): I-stat hCG, quantitative: <5 m[IU]/mL (ref <5)

## 2021-03-19 MED ORDER — KETOROLAC TROMETHAMINE 15 MG/ML IJ SOLN
15.0000 mg | Freq: Once | INTRAMUSCULAR | Status: AC
  Administered 2021-03-19: 15 mg via INTRAVENOUS
  Filled 2021-03-19: qty 1

## 2021-03-19 MED ORDER — NAPROXEN 500 MG PO TABS: 500.0000 mg | ORAL_TABLET | Freq: Two times a day (BID) | ORAL | 0 refills | Status: DC | PRN

## 2021-03-19 MED ORDER — OXYCODONE-ACETAMINOPHEN 5-325 MG PO TABS
1.0000 | ORAL_TABLET | Freq: Once | ORAL | Status: AC
Start: 2021-03-19 — End: 2021-03-19
  Administered 2021-03-19: 1 via ORAL
  Filled 2021-03-19: qty 1

## 2021-03-19 MED ORDER — FENTANYL CITRATE (PF) 100 MCG/2ML IJ SOLN
50.0000 ug | Freq: Once | INTRAMUSCULAR | Status: AC
  Administered 2021-03-19: 50 ug via INTRAVENOUS
  Filled 2021-03-19: qty 2

## 2021-03-19 MED ORDER — LIDOCAINE 5 % EX PTCH
2.0000 | MEDICATED_PATCH | CUTANEOUS | Status: DC
  Administered 2021-03-19: 2 via TRANSDERMAL
  Filled 2021-03-19: qty 2

## 2021-03-19 MED ORDER — METHOCARBAMOL 500 MG PO TABS: 500.0000 mg | ORAL_TABLET | Freq: Three times a day (TID) | ORAL | 0 refills | Status: DC | PRN

## 2021-03-19 MED ORDER — LIDOCAINE 5 % EX PTCH: 1.0000 | MEDICATED_PATCH | Freq: Every day | CUTANEOUS | 0 refills | Status: DC | PRN

## 2021-03-19 NOTE — Discharge Instructions (Addendum)
You were seen in the emergency department for back pain today.  Your pregnancy test was negative.  Your lower back xray showed degenerative changes  We have prescribed you an anti-inflammatory medication and a muscle relaxer.  - Naproxen is a nonsteroidal anti-inflammatory medication that will help with pain and swelling. Be sure to take this medication as prescribed with food, 1 pill every 12 hours,  It should be taken with food, as it can cause stomach upset, and more seriously, stomach bleeding. Do not take other nonsteroidal anti-inflammatory medications with this such as Advil, Motrin, Aleve, Mobic, Goodie Powder, or Motrin.    - Robaxin is the muscle relaxer I have prescribed, this is meant to help with muscle tightness. Be aware that this medication may make you drowsy therefore the first time you take this it should be at a time you are in an environment where you can rest. Do not drive or operate heavy machinery when taking this medication. Do not drink alcohol or take other sedating medications with this medicine such as narcotics or benzodiazepines.   - Lidoderm patch- apply 1 patch to your area of most significant pain once per day. Remove and discard patch within 12 hours of application.   You make take Tylenol per over the counter dosing with these medications.   We have prescribed you new medication(s) today. Discuss the medications prescribed today with your pharmacist as they can have adverse effects and interactions with your other medicines including over the counter and prescribed medications. Seek medical evaluation if you start to experience new or abnormal symptoms after taking one of these medicines, seek care immediately if you start to experience difficulty breathing, feeling of your throat closing, facial swelling, or rash as these could be indications of a more serious allergic reaction   The application of heat can help soothe the pain.    Please follow up with primary  care within 1 week. if you do not have a primary care provider one is provided in your discharge instructions- you may see the [place] clinic or call the provided phone number. However return to the ER should you develop ne or worsening symptoms or any other concerns including but not limited to severe or worsening pain, low back pain with fever, numbness, weakness, loss of bowel or bladder control, or inability to walk or urinate, you should return to the ER immediately.

## 2021-03-19 NOTE — ED Provider Notes (Signed)
[facility] DEPARTMENT Provider Note   CSN: [id number] Arrival date & time: 03/18/21  1627     History Chief Complaint  Patient presents with   Back Pain    Hannah Baldwin is a 50 y.o. female with a history of diabetes mellitus, hypertension, asthma, and diabetic neuropathy who presents to the ED with complaints of lower back pain x 2 weeks. Patient states pain is located to the bilateral lower back/gluteal region, radiates into the lower extremities at times, worse with certain movements/ambulation, no alleviating factors. No specific injury/trauma/change in activity the patient can recall. Denies numbness, tingling, weakness, saddle anesthesia, incontinence to bowel/bladder, fever, chills, IV drug use, dysuria, or hx of cancer. Patient has not had prior back surgeries.    HPI     Past Medical History:  Diagnosis Date   Anemia    Asthma Dx 2014   Diabetes mellitus without complication ([place])    Legally blind in left eye, as defined in [place]    since birth    Pneumonia ~ 2000 X 1    Patient Active Problem List   Diagnosis Date Noted   Essential hypertension 02/26/2020   Muscle cramp 02/26/2020   Fibroid uterus 01/09/2018   Primary osteoarthritis of left knee 04/09/2017   Insomnia 11/03/2015   Onychomycosis of toenail 07/25/2015   Diabetic neuropathy ([place]) 07/25/2015   DUB (dysfunctional uterine bleeding) 02/03/2015   Diabetes type 2, controlled ([place]) 01/25/2015   Severe obesity (BMI >= 40) ([place]) 01/25/2015   Moderate persistent asthma 01/21/2015   Cardiomegaly 06/09/2014   Hypochromic microcytic anemia 06/08/2014   Former smoker 01/01/2013    Past Surgical History:  Procedure Laterality Date   CESAREAN SECTION  1992     OB History     Gravida  2   Para  2   Term  0   Preterm  0   AB  0   Living  2      SAB  0   IAB  0   Ectopic  0   Multiple  0   Live Births  2           Family History  Problem Relation  Age of Onset   Asthma Mother    Asthma Daughter     Social History   Tobacco Use   Smoking status: Former    Packs/day: 0.25    Years: 20.00    Pack years: 5.00    Types: Cigarettes    Quit date: 01/18/2015    Years since quitting: 6.1   Smokeless tobacco: Never  Substance Use Topics   Alcohol use: Yes    Comment: 01/20/2015 "dank some alcohol on my birthday, cookouts,  etc,;  maybe once/month"   Drug use: No    Home Medications Prior to Admission medications   Medication Sig Start Date End Date Taking? Authorizing Provider  acetaminophen (TYLENOL) 325 MG tablet Take 2 tablets (650 mg total) by mouth every 8 (eight) hours. Patient taking differently: Take 650 mg by mouth every 8 (eight) hours as needed for mild pain or headache.  04/09/17   Ladell Pier, MD  albuterol (PROVENTIL) (2.5 MG/3ML) 0.083% nebulizer solution Take 3 mLs (2.5 mg total) by nebulization every 6 (six) hours as needed for wheezing or shortness of breath. 03/12/19   Ladell Pier, MD  albuterol (VENTOLIN HFA) 108 (90 Base) MCG/ACT inhaler Inhale 2 puffs into the lungs every 6 (six) hours as needed for wheezing  or shortness of breath. 06/28/20   Ladell Pier, MD  blood glucose meter kit and supplies KIT Dispense based on patient and insurance preference. Use up to four times daily as directed. (FOR ICD-9 250.00, 250.01). 01/25/15   Charlott Rakes, MD  Blood Glucose Monitoring Suppl (TRUE METRIX METER) w/Device KIT USE AS INSTRUCTED 09/24/17   Ladell Pier, MD  carbamide peroxide (DEBROX) 6.5 % OTIC solution Place 5 drops into the right ear 2 (two) times daily. 04/04/20   Camillia Herter, NP  cyclobenzaprine (FLEXERIL) 10 MG tablet TAKE 1 TABLET (10 MG TOTAL) BY MOUTH 2 (TWO) TIMES DAILY AS NEEDED FOR MUSCLE SPASMS. 06/28/20 06/28/21  Ladell Pier, MD  DULoxetine (CYMBALTA) 60 MG capsule TAKE 1 CAPSULE (60 MG TOTAL) BY MOUTH DAILY. 11/23/20 11/23/21  Ladell Pier, MD  ferrous sulfate 325 (65  FE) MG tablet Take 1 tablet (325 mg total) by mouth 2 (two) times daily with a meal. Patient not taking: Reported on 12/03/2019 03/12/19   Ladell Pier, MD  fluconazole (DIFLUCAN) 150 MG tablet take one tablet by mouth once monthly 03/02/21   Ladell Pier, MD  gabapentin (NEURONTIN) 300 MG capsule Take 1 capsule (300 mg total) by mouth 2 (two) times daily. Patient not taking: Reported on 12/03/2019 03/12/19   Ladell Pier, MD  glucose blood (TRUE METRIX BLOOD GLUCOSE TEST) test strip Use as instructed 09/24/17   Ladell Pier, MD  lisinopril (ZESTRIL) 10 MG tablet TAKE 1 TABLET (10 MG TOTAL) BY MOUTH DAILY. 05/09/20   Ladell Pier, MD  megestrol (MEGACE) 40 MG tablet TAKE 1 TABLET (40 MG TOTAL) BY MOUTH 2 (TWO) TIMES DAILY. 03/02/21 03/02/22  Ladell Pier, MD  meloxicam (MOBIC) 15 MG tablet TAKE 1 TABLET (15 MG TOTAL) BY MOUTH DAILY. 11/23/20 11/23/21  Ladell Pier, MD  metFORMIN (GLUCOPHAGE) 500 MG tablet Take 1 tablet (500 mg total) by mouth 2 (two) times daily. 02/26/20   Ladell Pier, MD  mometasone-formoterol (DULERA) 100-5 MCG/ACT AERO Inhale 2 puffs into the lungs 2 (two) times daily. 06/28/20   Ladell Pier, MD  nystatin-triamcinolone (MYCOLOG II) cream Apply 1 application topically 2 (two) times daily. Patient taking differently: Apply 1 application topically 2 (two) times daily as needed (for rashes).  07/24/19   Ladell Pier, MD  simethicone (MYLICON) 80 MG chewable tablet Chew 80-160 mg by mouth every 6 (six) hours as needed for flatulence.    [provider]  traZODone (DESYREL) 100 MG tablet TAKE 1 TABLET (100 MG TOTAL) BY MOUTH AT BEDTIME AS NEEDED FOR SLEEP. 07/06/20 07/06/21  Ladell Pier, MD  TRUEPLUS LANCETS 28G MISC Check blood sugar TID & QHS 09/24/17   Ladell Pier, MD  Vitamin D, Ergocalciferol, (DRISDOL) 1.25 MG (50000 UT) CAPS capsule Take 1 capsule (50,000 Units total) by mouth every 7 (seven) days. 09/29/19    Argentina Donovan, PA-C    Allergies    Patient has no known allergies.  Review of Systems   Review of Systems  Constitutional:  Negative for chills and fever.  Respiratory:  Negative for shortness of breath.   Cardiovascular:  Negative for chest pain.  Gastrointestinal:  Negative for abdominal pain and vomiting.  Genitourinary:  Negative for dysuria, frequency and urgency.  Musculoskeletal:  Positive for back pain.  Neurological:  Negative for weakness and numbness.       Negative for incontinence or saddle anesthesia.   All other systems  reviewed and are negative.  Physical Exam Updated Vital Signs BP (!) 146/90 (BP Location: Left Wrist)   Pulse 67   Temp (!) 97.3 F (36.3 C) (Oral)   Resp 14   SpO2 100%   Physical Exam Constitutional:      General: She is not in acute distress.    Appearance: She is well-developed. She is not toxic-appearing.  HENT:     Head: Normocephalic and atraumatic.  Cardiovascular:     Rate and Rhythm: Normal rate and regular rhythm.  Pulmonary:     Effort: Pulmonary effort is normal.     Breath sounds: Normal breath sounds.  Abdominal:     General: There is no distension.     Palpations: Abdomen is soft.     Tenderness: There is no abdominal tenderness. There is no right CVA tenderness, left CVA tenderness, guarding or rebound.  Musculoskeletal:     Cervical back: Normal range of motion and neck supple. No spinous process tenderness or muscular tenderness.     Comments: No obvious deformity, appreciable swelling, erythema, ecchymosis, significant open wounds, or increased warmth.  Extremities: Normal ROM. No focal bony tenderness.  Back: No point/focal vertebral tenderness, no palpable step off or crepitus. Tender throughout bilateral lumbar paraspinal muscles and bilateral gluteal areas.   Skin:    General: Skin is warm and dry.     Findings: No rash.  Neurological:     Mental Status: She is alert.     Deep Tendon Reflexes:     Reflex  Scores:      Patellar reflexes are 2+ on the right side and 2+ on the left side.    Comments: Sensation grossly intact to bilateral lower extremities. 5/5 symmetric strength with plantar/dorsiflexion bilaterally.     ED Results / Procedures / Treatments   Labs (all labs ordered are listed, but only abnormal results are displayed) Labs Reviewed  I-STAT BETA HCG BLOOD, ED ([facility], [facility], [facility] ONLY)    EKG None  Radiology DG Lumbar Spine Complete  Result Date: 03/18/2021 CLINICAL DATA:  Low back pain. States radiates from mid lower back to down to legs EXAM: LUMBAR SPINE - COMPLETE 4+ VIEW COMPARISON:  None. FINDINGS: Five non-rib-bearing lumbar vertebral bodies. There is no evidence of lumbar spine fracture. Multilevel osteophyte formation within the visualized lower thoracic spine. Facet arthropathy at the L4-L5 and L5-S1 level. Mild retrolisthesis of L5 on S1. Otherwise normal alignment. Intervertebral disc spaces are maintained. IMPRESSION: 1. Facet arthropathy in the lower lumbar spine. 2. Mild retrolisthesis of L5 on S1. 3.  Negative for acute traumatic injury. Electronically Signed   By: Iven Finn M.D.   On: 03/18/2021 21:26    Procedures Procedures   Medications Ordered in ED Medications  lidocaine (LIDODERM) 5 % 2 patch (2 patches Transdermal Patch Applied 03/19/21 0355)  ketorolac (TORADOL) 15 MG/ML injection 15 mg (15 mg Intravenous Given 03/19/21 0354)  oxyCODONE-acetaminophen (PERCOCET/ROXICET) 5-325 MG per tablet 1 tablet (1 tablet Oral Given 03/19/21 0353)    ED Course  I have reviewed the triage vital signs and the nursing notes.  Pertinent labs & imaging results that were available during my care of the patient were reviewed by me and considered in my medical decision making (see chart for details).    MDM Rules/Calculators/A&P                         Patient presents to the ED with complaints of back  pain x 2 weeks.  Nontoxic, vitals w/ elevated BP- doubt HTN  emergency.   Additional history obtained:  Additional history obtained from chart review & nursing note review.   Lab Tests:  I Ordered, reviewed, and interpreted labs, which included:  Pregnancy test- negative.   Imaging Studies ordered:  Lumbar spine x-ray ordered by provider in triage, I independently reviewed, formal radiology impression shows:  . Facet arthropathy in the lower lumbar spine. 2. Mild retrolisthesis of L5 on S1. 3.  Negative for acute traumatic injury  ED Course:  Patient given toradol, lidoderm patch, & percocet.   05:00: RE-EVAL: patient feeling much better, able to ambulate in the ED. Does still have some pain- fentanyl ordered, she overall feels okay to go home.   Patient without focal neuro deficits, ambulatory at this time, do not suspect acute cord compression/cauda equina syndrome currently. Afebrile, no urinary complaints, bilateral pain, do not suspect UTI/kidney stone. No hx of IVDU- do not suspect epidural abscess.   Likely muscular/degenerative changes contributing to pain. Feeling improved. Will discharge home with supportive care & PCP/spine follow up.   I discussed results, treatment plan, need for follow-up, and return precautions with the patient. Provided opportunity for questions, patient confirmed understanding and is in agreement with plan.   Portions of this note were generated with [employer]. Dictation errors may occur despite best attempts at proofreading.  Final Clinical Impression(s) / ED Diagnoses Final diagnoses:  Acute bilateral low back pain, unspecified whether sciatica present    Rx / DC Orders ED Discharge Orders          Ordered    naproxen (NAPROSYN) 500 MG tablet  2 times daily PRN        03/19/21 0523    methocarbamol (ROBAXIN) 500 MG tablet  Every 8 hours PRN        03/19/21 0523    lidocaine (LIDODERM) 5 %  Daily PRN        03/19/21 0523             Amaryllis Dyke, PA-C 03/19/21  6440    Veryl Speak, MD 03/19/21 [phone]

## 2021-03-20 ENCOUNTER — Other Ambulatory Visit: Payer: Self-pay

## 2021-03-20 MED ORDER — NAPROXEN 500 MG PO TABS
500.0000 mg | ORAL_TABLET | ORAL | 0 refills | Status: DC
  Filled 2021-03-20: qty 15, 7d supply, fill #0

## 2021-03-20 MED ORDER — METHOCARBAMOL 500 MG PO TABS
ORAL_TABLET | ORAL | 0 refills | Status: DC
  Filled 2021-03-20: qty 15, 5d supply, fill #0

## 2021-03-20 MED ORDER — LIDOCAINE 5 % EX PTCH
MEDICATED_PATCH | CUTANEOUS | 0 refills | Status: DC
  Filled 2021-03-20: qty 30, 30d supply, fill #0

## 2021-03-21 ENCOUNTER — Other Ambulatory Visit: Payer: Self-pay

## 2021-03-22 ENCOUNTER — Other Ambulatory Visit: Payer: Self-pay

## 2021-04-10 ENCOUNTER — Ambulatory Visit (HOSPITAL_COMMUNITY)
Admission: EM | Admit: 2021-04-10 | Discharge: 2021-04-10 | Disposition: A | Discharge status: Home / Self Care | Payer: Self-pay | Attending: Family Medicine | Admitting: Family Medicine

## 2021-04-10 ENCOUNTER — Other Ambulatory Visit: Payer: Self-pay

## 2021-04-10 ENCOUNTER — Encounter (HOSPITAL_COMMUNITY): Payer: Self-pay | Admitting: *Deleted

## 2021-04-10 DIAGNOSIS — R29898 Other symptoms and signs involving the musculoskeletal system: Secondary | ICD-10-CM

## 2021-04-10 DIAGNOSIS — K047 Periapical abscess without sinus: Secondary | ICD-10-CM

## 2021-04-10 LAB — CBG MONITORING, ED: Glucose-Capillary: 86 mg/dL (ref 70–99)

## 2021-04-10 MED ORDER — HYDROCODONE-ACETAMINOPHEN 5-325 MG PO TABS: 1.0000 | ORAL_TABLET | Freq: Three times a day (TID) | ORAL | 0 refills | Status: DC | PRN

## 2021-04-10 MED ORDER — KETOROLAC TROMETHAMINE 30 MG/ML IJ SOLN
INTRAMUSCULAR | Status: AC
  Filled 2021-04-10: qty 1

## 2021-04-10 MED ORDER — AMOXICILLIN-POT CLAVULANATE 875-125 MG PO TABS: 1.0000 | ORAL_TABLET | Freq: Two times a day (BID) | ORAL | 0 refills | Status: AC

## 2021-04-10 MED ORDER — KETOROLAC TROMETHAMINE 30 MG/ML IJ SOLN
30.0000 mg | Freq: Once | INTRAMUSCULAR | Status: AC
  Administered 2021-04-10: 30 mg via INTRAMUSCULAR

## 2021-04-10 NOTE — ED Provider Notes (Signed)
Hannah Baldwin    CSN: [id number] Arrival date & time: 04/10/21  1438      History   Chief Complaint Chief Complaint  Patient presents with   Dental Problem   RT leg weakness    HPI Hannah Baldwin is a 50 y.o. female.  She is presenting with dental pain and right leg weakness.  The dental pain has been ongoing for the past few days.  She reports sleeping with candy in her mouth at night.  The pain is over the left mandible.  Having right leg weakness.  She reports having osteoarthritis in the left knee.  Has been seen in the emergency department about a month ago for low back pain and sciatica.  HPI  Past Medical History:  Diagnosis Date   Anemia    Asthma Dx 2014   Diabetes mellitus without complication ([place])    Legally blind in left eye, as defined in [place]    since birth    Pneumonia ~ 2000 X 1    Patient Active Problem List   Diagnosis Date Noted   Essential hypertension 02/26/2020   Muscle cramp 02/26/2020   Fibroid uterus 01/09/2018   Primary osteoarthritis of left knee 04/09/2017   Insomnia 11/03/2015   Onychomycosis of toenail 07/25/2015   Diabetic neuropathy ([place]) 07/25/2015   DUB (dysfunctional uterine bleeding) 02/03/2015   Diabetes type 2, controlled ([place]) 01/25/2015   Severe obesity (BMI >= 40) ([place]) 01/25/2015   Moderate persistent asthma 01/21/2015   Cardiomegaly 06/09/2014   Hypochromic microcytic anemia 06/08/2014   Former smoker 01/01/2013    Past Surgical History:  Procedure Laterality Date   CESAREAN SECTION  1992    OB History     Gravida  2   Para  2   Term  0   Preterm  0   AB  0   Living  2      SAB  0   IAB  0   Ectopic  0   Multiple  0   Live Births  2            Home Medications    Prior to Admission medications   Medication Sig Start Date End Date Taking? Authorizing Provider  amoxicillin-clavulanate (AUGMENTIN) 875-125 MG tablet Take 1 tablet by mouth 2 (two) times daily for 10 days. 04/10/21  04/20/21 Yes Rosemarie Ax, MD  HYDROcodone-acetaminophen (NORCO/VICODIN) 5-325 MG tablet Take 1 tablet by mouth every 8 (eight) hours as needed for moderate pain. 04/10/21  Yes Rosemarie Ax, MD  albuterol (PROVENTIL) (2.5 MG/3ML) 0.083% nebulizer solution Take 3 mLs (2.5 mg total) by nebulization every 6 (six) hours as needed for wheezing or shortness of breath. 03/12/19   Ladell Pier, MD  albuterol (VENTOLIN HFA) 108 (90 Base) MCG/ACT inhaler Inhale 2 puffs into the lungs every 6 (six) hours as needed for wheezing or shortness of breath. 06/28/20   Ladell Pier, MD  blood glucose meter kit and supplies KIT Dispense based on patient and insurance preference. Use up to four times daily as directed. (FOR ICD-9 250.00, 250.01). 01/25/15   Charlott Rakes, MD  Blood Glucose Monitoring Suppl (TRUE METRIX METER) w/Device KIT USE AS INSTRUCTED 09/24/17   Ladell Pier, MD  carbamide peroxide (DEBROX) 6.5 % OTIC solution Place 5 drops into the right ear 2 (two) times daily. 04/04/20   Camillia Herter, NP  fluconazole (DIFLUCAN) 150 MG tablet take one tablet by mouth once monthly 03/02/21  Ladell Pier, MD  glucose blood (TRUE METRIX BLOOD GLUCOSE TEST) test strip Use as instructed 09/24/17   Ladell Pier, MD  lidocaine (LIDODERM) 5 % Place 1 patch onto the skin daily as needed. Apply patch to area most significant pain once per day.  Remove and discard patch within 12 hours of application. 03/19/21   Petrucelli, Samantha R, PA-C  lidocaine (LIDODERM) 5 % place 1 patch onto the skin daily as needed. (remove and  discard patch within 12 hours of application) 1/61/09   Petrucelli, Samantha R, PA-C  lisinopril (ZESTRIL) 10 MG tablet TAKE 1 TABLET (10 MG TOTAL) BY MOUTH DAILY. 05/09/20   Ladell Pier, MD  megestrol (MEGACE) 40 MG tablet TAKE 1 TABLET (40 MG TOTAL) BY MOUTH 2 (TWO) TIMES DAILY. Patient taking differently: Take 40 mg by mouth 2 (two) times daily. 03/02/21 03/02/22   Ladell Pier, MD  meloxicam (MOBIC) 15 MG tablet TAKE 1 TABLET (15 MG TOTAL) BY MOUTH DAILY. Patient taking differently: Take 15 mg by mouth daily. 11/23/20 11/23/21  Ladell Pier, MD  metFORMIN (GLUCOPHAGE) 500 MG tablet Take 1 tablet (500 mg total) by mouth 2 (two) times daily. 02/26/20   Ladell Pier, MD  methocarbamol (ROBAXIN) 500 MG tablet Take 1 tablet (500 mg total) by mouth every 8 (eight) hours as needed for muscle spasms. 03/19/21   Petrucelli, Glynda Jaeger, PA-C  methocarbamol (ROBAXIN) 500 MG tablet Take 1 tablet by mouth every 8 hours as needed for muscle spasms 03/19/21   Petrucelli, Samantha R, PA-C  mometasone-formoterol (DULERA) 100-5 MCG/ACT AERO Inhale 2 puffs into the lungs 2 (two) times daily. 06/28/20   Ladell Pier, MD  naproxen (NAPROSYN) 500 MG tablet Take 1 tablet (500 mg total) by mouth 2 (two) times daily as needed for moderate pain. 03/19/21   Petrucelli, Aldona Bar R, PA-C  naproxen (NAPROSYN) 500 MG tablet Take 1 tablet (500 mg total) by mouth twice daily as needed for moderate pain 03/19/21   Petrucelli, Samantha R, PA-C  nystatin-triamcinolone (MYCOLOG II) cream Apply 1 application topically 2 (two) times daily. Patient taking differently: Apply 1 application topically 2 (two) times daily as needed (for rashes).  07/24/19   Ladell Pier, MD  simethicone (MYLICON) 80 MG chewable tablet Chew 80-160 mg by mouth every 6 (six) hours as needed for flatulence.    [provider]  traZODone (DESYREL) 100 MG tablet TAKE 1 TABLET (100 MG TOTAL) BY MOUTH AT BEDTIME AS NEEDED FOR SLEEP. Patient taking differently: Take 100 mg by mouth at bedtime as needed for sleep. 07/06/20 07/06/21  Ladell Pier, MD  TRUEPLUS LANCETS 28G MISC Check blood sugar TID & QHS 09/24/17   Ladell Pier, MD  Vitamin D, Ergocalciferol, (DRISDOL) 1.25 MG (50000 UT) CAPS capsule Take 1 capsule (50,000 Units total) by mouth every 7 (seven) days. 09/29/19   Argentina Donovan, PA-C    Family History Family History  Problem Relation Age of Onset   Asthma Mother    Asthma Daughter     Social History Social History   Tobacco Use   Smoking status: Former    Packs/day: 0.25    Years: 20.00    Pack years: 5.00    Types: Cigarettes    Quit date: 01/18/2015    Years since quitting: 6.2   Smokeless tobacco: Never  Substance Use Topics   Alcohol use: Yes    Comment: 01/20/2015 "dank some alcohol on my birthday, cookouts,  etc,;  maybe once/month"   Drug use: No     Allergies   Patient has no known allergies.   Review of Systems Review of Systems See HPI  Physical Exam Triage Vital Signs ED Triage Vitals  Enc Vitals Group     BP 04/10/21 1506 (!) 144/91     Pulse Rate 04/10/21 1506 95     Resp 04/10/21 1506 20     Temp 04/10/21 1506 98.7 F (37.1 C)     Temp src --      SpO2 04/10/21 1506 100 %     Weight --      Height --      Head Circumference --      Peak Flow --      Pain Score 04/10/21 1505 10     Pain Loc --      Pain Edu? --      Excl. in Echo? --    No data found.  Updated Vital Signs BP (!) 144/91   Pulse 95   Temp 98.7 F (37.1 C)   Resp 20   SpO2 100%   Visual Acuity Right Eye Distance:   Left Eye Distance:   Bilateral Distance:    Right Eye Near:   Left Eye Near:    Bilateral Near:     Physical Exam Gen: NAD, alert, cooperative with exam, well-appearing ENT: Tenderness to palpation in the left lower mandible with redness, poor dentition, Skin: no rashes, no areas of induration  Neuro: normal tone, normal sensation to touch Psych:  normal insight, alert and oriented MSK:  Normal strength in right lower extremity. Normal reflexes. Neurovascular intact    UC Treatments / Results  Labs (all labs ordered are listed, but only abnormal results are displayed) Labs Reviewed  CBG MONITORING, ED    EKG   Radiology No results found.  Procedures Procedures (including critical care  time)  Medications Ordered in UC Medications  ketorolac (TORADOL) 30 MG/ML injection 30 mg (30 mg Intramuscular Given 04/10/21 1555)    Initial Impression / Assessment and Plan / UC Course  I have reviewed the triage vital signs and the nursing notes.  Pertinent labs & imaging results that were available during my care of the patient were reviewed by me and considered in my medical decision making (see chart for details).     Ms. Depaola is a 50 year old female is presenting with a dental abscess and right leg weakness.  Has significant poor dentition on the left lower left mandible that is contributing to her symptoms.  Right leg weakness is not demonstrated on exam.  May be a component of intermittent sciatica.  Exam is reassuring in terms of her strength.  Provided IM Toradol.  Provided Augmentin and a list of references for a dentist.  Counseled on home exercise therapy and supportive care.  Given indications on follow-up.  Final Clinical Impressions(s) / UC Diagnoses   Final diagnoses:  Dental abscess  Right leg weakness     Discharge Instructions      Please see a dentist  Please use the pain medicine for severe pain  Please see sports medicine for your leg weakness.    Dental list          updated 1.22.15 These dentists all accept Medicaid.  The list is for your convenience in choosing your child's dentist. Estos dentistas aceptan Medicaid.  La lista es para su [place] y es una cortesa.     [facility]  [phone] [address]  [address] [place] [address] Se habla espaol From 96 to 86 years old Parent may go with child Anette Riedel DDS     [phone] [address] [place]  [address] Se habla espaol From 28 to 25 years old Parent may NOT go with child  Rolene Arbour DMD    [phone] [place] [address] Se habla espaol [place] spoken From 51 years old Parent may go with child [facility]      [phone] [place]. [place] [address] Se habla espaol From 59 to 37 years old Parent may NOT go with child  Marcelo Baldy DDS     [phone] [facility]      [address]  [place] [address] No se habla espaol From teeth coming in Parent may go with child  [facility] Dept.     [phone] [address] [place] [address] Requires certification. Call for information. Requiere certificacin. Llame para informacin. Algunos dias se habla espaol  From birth to 35 years Parent possibly goes with child  Kandice Hams DDS     [place]  [address] [place] [address] Se habla espaol From 18 months to 18 years  Parent may go with child  J. [place] DDS    [place] DDS [address] [place] [address] Se habla espaol From 48 year old Parent may go with child  Shelton Silvas DDS    [phone] [place] [address] Se habla espaol  From 4 months old Parent may go with child Ivory Broad DDS    [phone] [address] [place] [address] Se habla espaol From 79 to 43 years old Parent may go with child  [facility]    [phone] [address] [place] [address] No se habla espaol From birth Parent may not go with child         ED Prescriptions     Medication Sig Dispense Auth. Provider   amoxicillin-clavulanate (AUGMENTIN) 875-125 MG tablet Take 1 tablet by mouth 2 (two) times daily for 10 days. 20 tablet Rosemarie Ax, MD   HYDROcodone-acetaminophen (NORCO/VICODIN) 5-325 MG tablet Take 1 tablet by mouth every 8 (eight) hours as needed for moderate pain. 15 tablet Rosemarie Ax, MD      I have reviewed the PDMP during this encounter.   Rosemarie Ax, MD 04/10/21 1600

## 2021-04-10 NOTE — ED Triage Notes (Signed)
Pt reports Dental pain since SAt. Pt reports she has been sleeping a lot and last night when PT was standing her RT leg gave out and Pt fell to floor. Pt also reports her leg gave out today on front lobby during registration . Pt reports she was able to catch herself and did not fall.

## 2021-04-10 NOTE — Discharge Instructions (Signed)
Please see a dentist  Please use the pain medicine for severe pain  Please see sports medicine for your leg weakness.    Dental list          updated 1.22.15 These dentists all accept Medicaid.  The list is for your convenience in choosing your child's dentist. Estos dentistas aceptan Medicaid.  La lista es para su [place] y es una cortesa.     [facility]     [phone] [place] [address] Se habla espaol From 77 to 49 years old Parent may go with child Anette Riedel DDS     [phone] [address] [place]  [address] Se habla espaol From 76 to 68 years old Parent may NOT go with child  Rolene Arbour DMD    [phone] [place] [address] Se habla espaol [place] spoken From 70 years old Parent may go with child [facility]     [phone] [place]. [place] [address] Se habla espaol From 22 to 64 years old Parent may NOT go with child  Marcelo Baldy DDS     [phone] [facility]      [address]  [place] [address] No se habla espaol From teeth coming in Parent may go with child  [facility]     [phone] [address] [place]. [place] [address] Requires certification. Call for information. Requiere certificacin. Llame para informacin. Algunos dias se habla espaol  From birth to 69 years Parent possibly goes with child  Kandice Hams DDS     [place]  [address] [place] [address] Se habla espaol From 18 months to 18 years  Parent may go with child  J. White DDS    [place] DDS [address] [place] [address] Se habla espaol From 65 year old Parent may go with child  Shelton Silvas DDS    [phone] [place] [address] Se habla espaol  From 55 months old Parent may go with child Ivory Broad DDS     [phone] [address] [place] [address] Se habla espaol From 31 to 51 years old Parent may go with child  [facility]    [phone] [address] [place] [address] No se habla espaol From birth Parent may not go with child

## 2021-04-10 NOTE — ED Triage Notes (Signed)
CBG 86. 

## 2021-04-13 ENCOUNTER — Other Ambulatory Visit: Payer: Self-pay

## 2021-04-13 ENCOUNTER — Ambulatory Visit (INDEPENDENT_AMBULATORY_CARE_PROVIDER_SITE_OTHER): Payer: Self-pay | Admitting: Sports Medicine

## 2021-04-13 ENCOUNTER — Encounter: Payer: Self-pay | Admitting: Sports Medicine

## 2021-04-13 VITALS — BP 155/59 | Ht 63.5 in

## 2021-04-13 DIAGNOSIS — G8929 Other chronic pain: Secondary | ICD-10-CM

## 2021-04-13 DIAGNOSIS — M545 Low back pain, unspecified: Secondary | ICD-10-CM

## 2021-04-13 NOTE — Progress Notes (Signed)
   Subjective:    Patient ID: Hannah Baldwin, female    DOB: [birth year], 50 y.o.   MRN: [id number]  HPI chief complaint low back pain  50 year old female comes in today for evaluation of low back pain and right leg weakness.  Low back pain has been present intermittently for years but became acutely worse a few weeks ago.  Pain was severe enough that she sought treatment in the emergency room on June 11.  X-rays at that time showed facet arthropathy at L4-L5 and L5-S1 as well as some mild retrolisthesis of L5 on S1.  She was treated at that time with Toradol, Lidoderm patch, and Percocet.  Symptoms improved at that time but returned around July 4.  She was once again given an IM Toradol injection which has helped.  Today she is simply using a Lidoderm patch.  Her pain is diffuse across her low back.  Although it is present with sitting as well as with ambulating it appears to be worse when walking or standing for long periods of time.  Pain improves with bending forward at the waist.  She denies numbness or tingling into her legs but does endorse an episode of weakness recently in the right leg which caused her to fall.  No change in bowel or bladder.  Past medical history reviewed Medications reviewed Allergies reviewed    Review of Systems As above    Objective:   Physical Exam Well-developed, well-nourished.  No acute distress  Lumbar spine: Pain with extension.  Full painless flexion.  She is diffusely tender to palpation around the lower lumbosacral area but nothing focal.  No spasm.  Negative straight leg raise bilaterally.  Reflexes are trace but equal at the Achilles and patellar tendons bilaterally.  Strength is 5/5 in both lower extremities.  Sensations intact grossly to light touch.  Patient is ambulating without an antalgic gait.  X-rays of her lumbar spine are as above       Assessment & Plan:   Low back pain likely secondary to facet arthropathy  Patient would benefit  from facet injections but will need a preprocedural MRI first.  She is currently in the process of acquiring insurance and I have instructed her to call me once that is established.  I will then plan on ordering the MRI at that time.  She may also benefit from some formal physical therapy.  In the meantime she will continue with her Lidoderm patches as needed for pain.

## 2021-04-14 ENCOUNTER — Telehealth: Payer: Self-pay | Admitting: Internal Medicine

## 2021-04-14 NOTE — Telephone Encounter (Signed)
Will forward to provider  

## 2021-04-14 NOTE — Telephone Encounter (Signed)
Pt is calling to request if Dr. Wynetta Emery can write another letter to take to Social Services to assist her with obtain her medicaid. Pt states that Dr. Wynetta Emery had previously written a similar letter over a year ago. Please advise [phone]

## 2021-04-19 ENCOUNTER — Other Ambulatory Visit: Payer: Self-pay | Admitting: Student

## 2021-04-19 MED FILL — Meloxicam Tab 15 MG: ORAL | 30 days supply | Qty: 30 | Fill #1 | Status: AC

## 2021-04-19 NOTE — Telephone Encounter (Signed)
Contacted pt and made aware that letter will be ready for pick up tomorrow

## 2021-04-20 ENCOUNTER — Other Ambulatory Visit: Payer: Self-pay

## 2021-04-20 ENCOUNTER — Other Ambulatory Visit: Payer: Self-pay | Admitting: Internal Medicine

## 2021-04-20 ENCOUNTER — Telehealth: Payer: Self-pay | Admitting: Internal Medicine

## 2021-04-20 DIAGNOSIS — M1712 Unilateral primary osteoarthritis, left knee: Secondary | ICD-10-CM

## 2021-04-20 NOTE — Telephone Encounter (Signed)
D/C 03/19/21.

## 2021-04-24 ENCOUNTER — Other Ambulatory Visit: Payer: Self-pay

## 2021-04-27 ENCOUNTER — Other Ambulatory Visit: Payer: Self-pay

## 2021-04-27 ENCOUNTER — Other Ambulatory Visit: Payer: Self-pay | Admitting: Internal Medicine

## 2021-04-27 NOTE — Telephone Encounter (Signed)
Requested medication (s) are due for refill today: Yes  Requested medication (s) are on the active medication list: Yes  Last refill:  03/20/21  Future visit scheduled: Yes  Notes to clinic:  See request.    Requested Prescriptions  Pending Prescriptions Disp Refills   lidocaine (LIDODERM) 5 % 30 patch 0    Sig: place 1 patch onto the skin daily as needed. (remove and  discard patch within 12 hours of application)      Analgesics:  Topicals Passed - 04/27/2021  2:02 PM      Passed - Valid encounter within last 12 months    Recent Outpatient Visits           10 months ago Type 2 diabetes, controlled, with neuropathy [facility])   [place] Ladell Pier, MD   1 year ago Clogged ear, right   [place], [place], NP   1 year ago Muscle cramp   [place], Deborah B, MD   1 year ago Mood swings   [place], [place]   1 year ago Type 2 diabetes, controlled, with neuropathy [facility])   [place], MD       Future Appointments             In 1 week Thereasa Solo, [place] B[place]

## 2021-05-01 ENCOUNTER — Other Ambulatory Visit: Payer: Self-pay

## 2021-05-10 ENCOUNTER — Ambulatory Visit: Payer: Self-pay | Attending: Physician Assistant | Admitting: Physician Assistant

## 2021-05-10 ENCOUNTER — Other Ambulatory Visit: Payer: Self-pay

## 2021-05-10 ENCOUNTER — Encounter: Payer: Self-pay | Admitting: Physician Assistant

## 2021-05-10 VITALS — BP 133/84 | HR 74 | Resp 16 | Wt 318.8 lb

## 2021-05-10 DIAGNOSIS — E1140 Type 2 diabetes mellitus with diabetic neuropathy, unspecified: Secondary | ICD-10-CM

## 2021-05-10 DIAGNOSIS — M5442 Lumbago with sciatica, left side: Secondary | ICD-10-CM

## 2021-05-10 DIAGNOSIS — F32A Depression, unspecified: Secondary | ICD-10-CM

## 2021-05-10 DIAGNOSIS — I10 Essential (primary) hypertension: Secondary | ICD-10-CM

## 2021-05-10 DIAGNOSIS — M1712 Unilateral primary osteoarthritis, left knee: Secondary | ICD-10-CM

## 2021-05-10 DIAGNOSIS — G4700 Insomnia, unspecified: Secondary | ICD-10-CM

## 2021-05-10 DIAGNOSIS — J4540 Moderate persistent asthma, uncomplicated: Secondary | ICD-10-CM

## 2021-05-10 LAB — GLUCOSE, POCT (MANUAL RESULT ENTRY): POC Glucose: 69 mg/dl — AB (ref 70–99)

## 2021-05-10 LAB — POCT GLYCOSYLATED HEMOGLOBIN (HGB A1C): HbA1c, POC (controlled diabetic range): 5.8 % (ref 0.0–7.0)

## 2021-05-10 MED ORDER — MELOXICAM 15 MG PO TABS
ORAL_TABLET | Freq: Every day | ORAL | 2 refills | Status: DC
  Filled 2021-05-10: qty 30, fill #0
  Filled 2021-11-03: qty 30, 30d supply, fill #0
  Filled 2021-12-06 – 2021-12-13 (×3): qty 30, 30d supply, fill #1

## 2021-05-10 MED ORDER — METFORMIN HCL 500 MG PO TABS
500.0000 mg | ORAL_TABLET | Freq: Two times a day (BID) | ORAL | 1 refills | Status: DC
  Filled 2021-05-10: qty 60, 30d supply, fill #0

## 2021-05-10 MED ORDER — METHOCARBAMOL 500 MG PO TABS
1000.0000 mg | ORAL_TABLET | Freq: Three times a day (TID) | ORAL | 1 refills | Status: DC | PRN
  Filled 2021-05-10: qty 90, 15d supply, fill #0

## 2021-05-10 MED ORDER — LIDOCAINE 5 % EX PTCH
MEDICATED_PATCH | CUTANEOUS | 3 refills | Status: DC
  Filled 2021-05-10: qty 30, 30d supply, fill #0

## 2021-05-10 MED ORDER — ALBUTEROL SULFATE (2.5 MG/3ML) 0.083% IN NEBU
2.5000 mg | INHALATION_SOLUTION | Freq: Four times a day (QID) | RESPIRATORY_TRACT | 12 refills | Status: DC | PRN
  Filled 2021-05-10: qty 75, 7d supply, fill #0

## 2021-05-10 MED ORDER — DULERA 100-5 MCG/ACT IN AERO
2.0000 | INHALATION_SPRAY | Freq: Two times a day (BID) | RESPIRATORY_TRACT | 12 refills | Status: DC
  Filled 2021-05-10: qty 13, 30d supply, fill #0

## 2021-05-10 MED ORDER — LISINOPRIL 10 MG PO TABS
10.0000 mg | ORAL_TABLET | Freq: Every day | ORAL | 1 refills | Status: DC
  Filled 2021-05-10: qty 30, 30d supply, fill #0

## 2021-05-10 MED ORDER — TRAZODONE HCL 100 MG PO TABS
100.0000 mg | ORAL_TABLET | Freq: Every evening | ORAL | 1 refills | Status: DC | PRN
  Filled 2021-05-10: qty 30, 30d supply, fill #0

## 2021-05-10 MED ORDER — ALBUTEROL SULFATE HFA 108 (90 BASE) MCG/ACT IN AERS
2.0000 | INHALATION_SPRAY | Freq: Four times a day (QID) | RESPIRATORY_TRACT | 2 refills | Status: DC | PRN
  Filled 2021-05-10: qty 18, 25d supply, fill #0

## 2021-05-10 MED ORDER — DULOXETINE HCL 60 MG PO CPEP
60.0000 mg | ORAL_CAPSULE | Freq: Every day | ORAL | 4 refills | Status: DC
  Filled 2021-05-10 – 2021-11-03 (×2): qty 30, 30d supply, fill #0
  Filled 2021-12-06: qty 30, 30d supply, fill #1

## 2021-05-10 NOTE — Progress Notes (Signed)
Patient ID: Hannah Baldwin, female   DOB: 1971-08-01, 50 y.o.   MRN: [id number]   Hannah Baldwin, is a 50 y.o. female  [id number]:[id number]  [id number]  DOB - 01-08-1971  Subjective:  Chief Complaint and HPI: Hannah Baldwin is a 50 y.o. female here today for med RF.  She wants to restart duloxetine bc it helped with her back pain and depression.  Wants a RF on methocarbamol at a higher dose. Has cut back from smoking 2 ppd to 2 cigs per day   ROS:   Constitutional:  No f/c, No night sweats, No unexplained weight loss. EENT:  No vision changes, No blurry vision, No hearing changes. No mouth, throat, or ear problems.  Respiratory: No cough, No SOB Cardiac: No CP, no palpitations GI:  No abd pain, No N/V/D. GU: No Urinary s/sx Musculoskeletal:back pain with L leg pain Neuro: No headache, no dizziness, no motor weakness.  Skin: No rash Endocrine:  No polydipsia. No polyuria.  Psych: Denies SI/HI  No problems updated.  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 Authorizing Provider  DULoxetine (CYMBALTA) 60 MG capsule Take 1 capsule (60 mg total) by mouth daily. 05/10/21  Yes Argentina Donovan, PA-C  albuterol (PROVENTIL) (2.5 MG/3ML) 0.083% nebulizer solution Take 3 mLs (2.5 mg total) by nebulization every 6 (six) hours as needed for wheezing or shortness of breath. 05/10/21   Argentina Donovan, PA-C  albuterol (VENTOLIN HFA) 108 (90 Base) MCG/ACT inhaler Inhale 2 puffs into the lungs every 6 (six) hours as needed for wheezing or shortness of breath. 05/10/21   Argentina Donovan, PA-C  blood glucose meter kit and supplies KIT Dispense based on patient and insurance preference. Use  up to four times daily as directed. (FOR ICD-9 250.00, 250.01). 01/25/15   Charlott Rakes, MD  Blood Glucose Monitoring Suppl (TRUE METRIX METER) w/Device KIT USE AS INSTRUCTED 09/24/17   Ladell Pier, MD  carbamide peroxide (DEBROX) 6.5 % OTIC solution Place 5 drops into the right ear 2 (two) times daily. 04/04/20   Camillia Herter, NP  fluconazole (DIFLUCAN) 150 MG tablet take one tablet by mouth once monthly 03/02/21   Ladell Pier, MD  glucose blood (TRUE METRIX BLOOD GLUCOSE TEST) test strip Use as instructed 09/24/17   Ladell Pier, MD  HYDROcodone-acetaminophen (NORCO/VICODIN) 5-325 MG tablet Take 1 tablet by mouth every 8 (eight) hours as needed for moderate pain. 04/10/21   Rosemarie Ax, MD  lidocaine (LIDODERM) 5 % Place 1 patch onto the skin daily as needed. Apply patch to area most significant pain once per day.  Remove and discard patch within 12 hours of application. 03/19/21   Petrucelli, Samantha R, PA-C  lidocaine (LIDODERM) 5 % place 1 patch onto the skin daily as needed. (remove and  discard patch within 12 hours of application) 01/07/31   Denay Pleitez, Dionne Bucy, PA-C  lisinopril (ZESTRIL) 10 MG tablet Take 1 tablet (10 mg total) by mouth daily. 05/10/21   Argentina Donovan, PA-C  megestrol (MEGACE) 40 MG tablet TAKE 1 TABLET (40 MG TOTAL) BY MOUTH 2 (  TWO) TIMES DAILY. Patient taking differently: Take 40 mg by mouth 2 (two) times daily. 03/02/21 03/02/22  Ladell Pier, MD  meloxicam (MOBIC) 15 MG tablet TAKE 1 TABLET (15 MG TOTAL) BY MOUTH DAILY. 05/10/21 05/10/22  Argentina Donovan, PA-C  metFORMIN (GLUCOPHAGE) 500 MG tablet Take 1 tablet (500 mg total) by mouth 2 (two) times daily. 05/10/21   Argentina Donovan, PA-C  methocarbamol (ROBAXIN) 500 MG tablet Take 2 tablets (1,000 mg total) by mouth every 8 (eight) hours as needed for muscle spasms. 05/10/21   Argentina Donovan, PA-C  mometasone-formoterol (DULERA) 100-5 MCG/ACT AERO Inhale 2 puffs into the lungs 2 (two) times  daily. 05/10/21   Argentina Donovan, PA-C  naproxen (NAPROSYN) 500 MG tablet Take 1 tablet (500 mg total) by mouth 2 (two) times daily as needed for moderate pain. 03/19/21   Petrucelli, Samantha R, PA-C  nystatin-triamcinolone (MYCOLOG II) cream Apply 1 application topically 2 (two) times daily. Patient taking differently: Apply 1 application topically 2 (two) times daily as needed (for rashes).  07/24/19   Ladell Pier, MD  simethicone (MYLICON) 80 MG chewable tablet Chew 80-160 mg by mouth every 6 (six) hours as needed for flatulence.    [provider]  traZODone (DESYREL) 100 MG tablet Take 1 tablet (100 mg total) by mouth at bedtime as needed for sleep. 05/10/21 05/10/22  Argentina Donovan, PA-C  TRUEPLUS LANCETS 28G MISC Check blood sugar TID & QHS 09/24/17   Ladell Pier, MD  Vitamin D, Ergocalciferol, (DRISDOL) 1.25 MG (50000 UT) CAPS capsule Take 1 capsule (50,000 Units total) by mouth every 7 (seven) days. 09/29/19   Argentina Donovan, PA-C     Objective:  EXAM:   Vitals:   05/10/21 1401  BP: 133/84  Pulse: 74  Resp: 16  SpO2: 99%  Weight: (!) 318 lb 12.8 oz (144.6 kg)    General appearance : A&OX3. NAD. Non-toxic-appearing;  morbidly obese HEENT: Atraumatic and Normocephalic.  PERRLA. EOM intact.  Neck: supple, no JVD. No cervical lymphadenopathy. No thyromegaly Chest/Lungs:  Breathing-non-labored, Good air entry bilaterally, breath sounds normal without rales, rhonchi, or wheezing  CVS: S1 S2 regular, no murmurs, gallops, rubs  Extremities: Bilateral Lower Ext shows no edema, both legs are warm to touch with = pulse throughout Neurology:  CN II-XII grossly intact, Non focal.   Psych:  TP linear. J/I WNL. Normal speech. Appropriate eye contact and affect.  Skin:  No Rash  Data Review Lab Results  Component Value Date   HGBA1C 5.8 05/10/2021   HGBA1C 5.4 02/26/2020   HGBA1C 5.9 (H) 09/25/2019   Depression screen PHQ 2/9 05/10/2021 02/26/2020 09/23/2019   Decreased Interest 2 1 0  Down, Depressed, Hopeless 2 0 0  PHQ - 2 Score 4 1 0  Altered sleeping 3 - -  Tired, decreased energy 2 - -  Change in appetite 2 - -  Feeling bad or failure about yourself  1 - -  Trouble concentrating 1 - -  Moving slowly or fidgety/restless 1 - -  Suicidal thoughts 0 - -  PHQ-9 Score 14 - -  Some recent data might be hidden     Assessment & Plan   1. Type 2 diabetes, controlled, with neuropathy ([place]) I have had a lengthy discussion and provided education about insulin resistance and the intake of too much sugar/refined carbohydrates.  I have advised the patient to work at a goal of eliminating sugary drinks, candy, desserts, sweets,  refined sugars, processed foods, and white carbohydrates.  The patient expresses understanding.  - Glucose (CBG) - HgB A1c - metFORMIN (GLUCOPHAGE) 500 MG tablet; Take 1 tablet (500 mg total) by mouth 2 (two) times daily.  Dispense: 180 tablet; Refill: 1 - Comprehensive metabolic panel - Lipid panel - CBC with Differential/Platelet  2. Essential hypertension controlled - lisinopril (ZESTRIL) 10 MG tablet; Take 1 tablet (10 mg total) by mouth daily.  Dispense: 90 tablet; Refill: 1 - CBC with Differential/Platelet  3. Moderate persistent asthma without complication - mometasone-formoterol (DULERA) 100-5 MCG/ACT AERO; Inhale 2 puffs into the lungs 2 (two) times daily.  Dispense: 13 g; Refill: 12 - albuterol (VENTOLIN HFA) 108 (90 Base) MCG/ACT inhaler; Inhale 2 puffs into the lungs every 6 (six) hours as needed for wheezing or shortness of breath.  Dispense: 18 g; Refill: 2 - albuterol (PROVENTIL) (2.5 MG/3ML) 0.083% nebulizer solution; Take 3 mLs (2.5 mg total) by nebulization every 6 (six) hours as needed for wheezing or shortness of breath.  Dispense: 75 mL; Refill: 12  4. Primary osteoarthritis of left knee - meloxicam (MOBIC) 15 MG tablet; TAKE 1 TABLET (15 MG TOTAL) BY MOUTH DAILY.  Dispense: 30 tablet; Refill:  2  5. Insomnia, unspecified type - traZODone (DESYREL) 100 MG tablet; Take 1 tablet (100 mg total) by mouth at bedtime as needed for sleep.  Dispense: 90 tablet; Refill: 1  6. Low back pain with left-sided sciatica, unspecified back pain laterality, unspecified chronicity - methocarbamol (ROBAXIN) 500 MG tablet; Take 2 tablets (1,000 mg total) by mouth every 8 (eight) hours as needed for muscle spasms.  Dispense: 90 tablet; Refill: 1 - lidocaine (LIDODERM) 5 %; place 1 patch onto the skin daily as needed. (remove and  discard patch within 12 hours of application)  Dispense: 30 patch; Refill: 3 - meloxicam (MOBIC) 15 MG tablet; TAKE 1 TABLET (15 MG TOTAL) BY MOUTH DAILY.  Dispense: 30 tablet; Refill: 2 - DULoxetine (CYMBALTA) 60 MG capsule; Take 1 capsule (60 mg total) by mouth daily.  Dispense: 30 capsule; Refill: 4  7. Depression, unspecified depression type - DULoxetine (CYMBALTA) 60 MG capsule; Take 1 capsule (60 mg total) by mouth daily.  Dispense: 30 capsule; Refill: 4     Patient have been counseled extensively about nutrition and exercise  Return in about 3 months (around 08/10/2021) for PCP/chronic conditions.  The patient was given clear instructions to go to ER or return to medical center if symptoms don't improve, worsen or new problems develop. The patient verbalized understanding. The patient was told to call to get lab results if they haven't heard anything in the next week.     Freeman Caldron, PA-C [facility] and [place], [place]   05/10/2021, 2:17 PM

## 2021-05-11 LAB — CBC WITH DIFFERENTIAL/PLATELET
Basophils Absolute: 0 10*3/uL (ref 0.0–0.2)
Basos: 0 %
EOS (ABSOLUTE): 0.2 10*3/uL (ref 0.0–0.4)
Eos: 2 %
Hematocrit: 36.6 % (ref 34.0–46.6)
Hemoglobin: 11.1 g/dL (ref 11.1–15.9)
Immature Grans (Abs): 0 10*3/uL (ref 0.0–0.1)
Immature Granulocytes: 0 %
Lymphocytes Absolute: 2.3 10*3/uL (ref 0.7–3.1)
Lymphs: 26 %
MCH: 25.8 pg — ABNORMAL LOW (ref 26.6–33.0)
MCHC: 30.3 g/dL — ABNORMAL LOW (ref 31.5–35.7)
MCV: 85 fL (ref 79–97)
Monocytes Absolute: 0.4 10*3/uL (ref 0.1–0.9)
Monocytes: 5 %
Neutrophils Absolute: 6 10*3/uL (ref 1.4–7.0)
Neutrophils: 67 %
Platelets: 371 10*3/uL (ref 150–450)
RBC: 4.3 x10E6/uL (ref 3.77–5.28)
RDW: 15.3 % (ref 11.7–15.4)
WBC: 9 10*3/uL (ref 3.4–10.8)

## 2021-05-11 LAB — COMPREHENSIVE METABOLIC PANEL
ALT: 11 IU/L (ref 0–32)
AST: 13 IU/L (ref 0–40)
Albumin/Globulin Ratio: 1.4 (ref 1.2–2.2)
Albumin: 4 g/dL (ref 3.8–4.8)
Alkaline Phosphatase: 103 IU/L (ref 44–121)
BUN/Creatinine Ratio: 15 (ref 9–23)
BUN: 13 mg/dL (ref 6–24)
Bilirubin Total: 0.2 mg/dL (ref 0.0–1.2)
CO2: 21 mmol/L (ref 20–29)
Calcium: 9.6 mg/dL (ref 8.7–10.2)
Chloride: 103 mmol/L (ref 96–106)
Creatinine, Ser: 0.87 mg/dL (ref 0.57–1.00)
Globulin, Total: 2.8 g/dL (ref 1.5–4.5)
Glucose: 113 mg/dL — ABNORMAL HIGH (ref 65–99)
Potassium: 4.6 mmol/L (ref 3.5–5.2)
Sodium: 142 mmol/L (ref 134–144)
Total Protein: 6.8 g/dL (ref 6.0–8.5)
eGFR: 82 mL/min/{1.73_m2} (ref >59)

## 2021-05-11 LAB — LIPID PANEL
Chol/HDL Ratio: 2.9 ratio (ref 0.0–4.4)
Cholesterol, Total: 140 mg/dL (ref 100–199)
HDL: 49 mg/dL (ref >39)
LDL Chol Calc (NIH): 80 mg/dL (ref 0–99)
Triglycerides: 53 mg/dL (ref 0–149)
VLDL Cholesterol Cal: 11 mg/dL (ref 5–40)

## 2021-05-17 ENCOUNTER — Other Ambulatory Visit: Payer: Self-pay

## 2021-06-30 ENCOUNTER — Other Ambulatory Visit: Payer: Self-pay

## 2021-08-11 ENCOUNTER — Ambulatory Visit: Payer: Self-pay | Attending: Internal Medicine | Admitting: Internal Medicine

## 2021-08-11 ENCOUNTER — Other Ambulatory Visit: Payer: Self-pay

## 2021-08-11 ENCOUNTER — Encounter: Payer: Self-pay | Admitting: Internal Medicine

## 2021-08-11 DIAGNOSIS — M5416 Radiculopathy, lumbar region: Secondary | ICD-10-CM

## 2021-08-11 DIAGNOSIS — L304 Erythema intertrigo: Secondary | ICD-10-CM

## 2021-08-11 DIAGNOSIS — M170 Bilateral primary osteoarthritis of knee: Secondary | ICD-10-CM

## 2021-08-11 DIAGNOSIS — Z2821 Immunization not carried out because of patient refusal: Secondary | ICD-10-CM

## 2021-08-11 DIAGNOSIS — E1169 Type 2 diabetes mellitus with other specified complication: Secondary | ICD-10-CM

## 2021-08-11 DIAGNOSIS — I10 Essential (primary) hypertension: Secondary | ICD-10-CM

## 2021-08-11 DIAGNOSIS — Z1211 Encounter for screening for malignant neoplasm of colon: Secondary | ICD-10-CM

## 2021-08-11 MED ORDER — FLUCONAZOLE 150 MG PO TABS
ORAL_TABLET | ORAL | 6 refills | Status: DC
  Filled 2021-08-11 – 2021-11-03 (×2): qty 1, 30d supply, fill #0
  Filled 2021-12-06 – 2021-12-13 (×2): qty 1, 30d supply, fill #1

## 2021-08-11 MED ORDER — NYSTATIN-TRIAMCINOLONE 100000-0.1 UNIT/GM-% EX CREA
1.0000 "application " | TOPICAL_CREAM | Freq: Two times a day (BID) | CUTANEOUS | 0 refills | Status: DC
  Filled 2021-08-11: qty 60, 30d supply, fill #0

## 2021-08-11 NOTE — Progress Notes (Signed)
Patient ID: BEONCA GIBB, female    DOB: [birth year]  MRN: [id number]  CC: Chronic disease management.  Subjective: Hannah Baldwin is a 50 y.o. female who presents for chronic disease management. Her concerns today include:  Patient with history of DM 2 with neuropathy, obesity, mod persistent asthma, osteoarthritis of the left knee, iron deficiency anemia,DUB with fibroid, opioid addiction previously on Methadone.  Patient tells me that she is applying for disability. Was approved for [facility] but it is of no use to her. She is applying for [employer] Currently out of work due to issues she has been having with her back and right leg.  She woke up 1 morning in the summer with pain in her lower back and right leg gave out.  She was seen in the emergency room for the same.  Subsequently seen by sports medicine Dr. Micheline Chapman.  He wants to do an MRI of the back but patient waiting until she is approved for the [employer] card[employer] discount.  She has yet to submit her application.  Needing RF on Diflucan for intertrigo in the skin folds of her legs  Out of Cymbalta and Meloxicam.  Requests refill.  She states that previously it was arthritis in the left knee that was most bothersome.  However recently the right knee has started to bother her too.  DM/Obesity:  taking Metformin as prescribed Appetite goes and comes.  Weakness is sodas. Blood pressure noted to be elevated today.  She tells me that she has not been taking the lisinopril consistently. In regards to her anemia: Her hemoglobin has remained stable around 11.  She does not take iron consistently because it causes constipation.  Patient Active Problem List   Diagnosis Date Noted   Essential hypertension 02/26/2020   Muscle cramp 02/26/2020   Fibroid uterus 01/09/2018   Primary osteoarthritis of left knee 04/09/2017   Insomnia 11/03/2015   Onychomycosis of toenail 07/25/2015   Diabetic neuropathy ([place]) 07/25/2015   DUB  (dysfunctional uterine bleeding) 02/03/2015   Diabetes type 2, controlled ([place]) 01/25/2015   Severe obesity (BMI >= 40) ([place]) 01/25/2015   Moderate persistent asthma 01/21/2015   Cardiomegaly 06/09/2014   Hypochromic microcytic anemia 06/08/2014   Former smoker 01/01/2013     Current Outpatient Medications on File Prior to Visit  Medication Sig Dispense Refill   albuterol (PROVENTIL) (2.5 MG/3ML) 0.083% nebulizer solution Take 3 mLs (2.5 mg total) by nebulization every 6 (six) hours as needed for wheezing or shortness of breath. 75 mL 12   albuterol (VENTOLIN HFA) 108 (90 Base) MCG/ACT inhaler Inhale 2 puffs into the lungs every 6 (six) hours as needed for wheezing or shortness of breath. 18 g 2   blood glucose meter kit and supplies KIT Dispense based on patient and insurance preference. Use up to four times daily as directed. (FOR ICD-9 250.00, 250.01). 1 each 0   Blood Glucose Monitoring Suppl (TRUE METRIX METER) w/Device KIT USE AS INSTRUCTED 1 kit 0   carbamide peroxide (DEBROX) 6.5 % OTIC solution Place 5 drops into the right ear 2 (two) times daily. 15 mL 0   DULoxetine (CYMBALTA) 60 MG capsule Take 1 capsule (60 mg total) by mouth daily. 30 capsule 4   glucose blood (TRUE METRIX BLOOD GLUCOSE TEST) test strip Use as instructed 100 each 12   HYDROcodone-acetaminophen (NORCO/VICODIN) 5-325 MG tablet Take 1 tablet by mouth every 8 (eight) hours as needed for moderate pain. 15 tablet 0  lidocaine (LIDODERM) 5 % Place 1 patch onto the skin daily as needed. Apply patch to area most significant pain once per day.  Remove and discard patch within 12 hours of application. 30 patch 0   lidocaine (LIDODERM) 5 % place 1 patch onto the skin daily as needed. (remove and  discard patch within 12 hours of application) 30 patch 3   lisinopril (ZESTRIL) 10 MG tablet Take 1 tablet (10 mg total) by mouth daily. 90 tablet 1   megestrol (MEGACE) 40 MG tablet TAKE 1 TABLET (40 MG TOTAL) BY MOUTH 2 (TWO)  TIMES DAILY. (Patient taking differently: Take 40 mg by mouth 2 (two) times daily.) 60 tablet 4   meloxicam (MOBIC) 15 MG tablet TAKE 1 TABLET (15 MG TOTAL) BY MOUTH DAILY. 30 tablet 2   metFORMIN (GLUCOPHAGE) 500 MG tablet Take 1 tablet (500 mg total) by mouth 2 (two) times daily. 180 tablet 1   methocarbamol (ROBAXIN) 500 MG tablet Take 2 tablets (1,000 mg total) by mouth every 8 (eight) hours as needed for muscle spasms. 90 tablet 1   mometasone-formoterol (DULERA) 100-5 MCG/ACT AERO Inhale 2 puffs into the lungs 2 (two) times daily. 13 g 12   simethicone (MYLICON) 80 MG chewable tablet Chew 80-160 mg by mouth every 6 (six) hours as needed for flatulence.     traZODone (DESYREL) 100 MG tablet Take 1 tablet (100 mg total) by mouth at bedtime as needed for sleep. 90 tablet 1   TRUEPLUS LANCETS 28G MISC Check blood sugar TID & QHS 100 each 2   Vitamin D, Ergocalciferol, (DRISDOL) 1.25 MG (50000 UT) CAPS capsule Take 1 capsule (50,000 Units total) by mouth every 7 (seven) days. 16 capsule 0   No current facility-administered medications on file prior to visit.    No Known Allergies  Social History   Socioeconomic History   Marital status: Widowed    Spouse name: Not on file   Number of children: Not on file   Years of education: Not on file   Highest education level: Not on file  Occupational History   Not on file  Tobacco Use   Smoking status: Former    Packs/day: 0.25    Years: 20.00    Pack years: 5.00    Types: Cigarettes    Quit date: 01/18/2015    Years since quitting: 6.5   Smokeless tobacco: Never  Substance and Sexual Activity   Alcohol use: Yes    Comment: 01/20/2015 "dank some alcohol on my birthday, cookouts,  etc,;  maybe once/month"   Drug use: No   Sexual activity: Not Currently    Birth control/protection: None  Other Topics Concern   Not on file  Social History Narrative   Not on file   Social Determinants of Health   Financial Resource Strain: Not on file   Food Insecurity: Not on file  Transportation Needs: Not on file  Physical Activity: Not on file  Stress: Not on file  Social Connections: Not on file  Intimate Partner Violence: Not on file    Family History  Problem Relation Age of Onset   Asthma Mother    Asthma Daughter     Past Surgical History:  Procedure Laterality Date   CESAREAN SECTION  1992    ROS: Review of Systems Negative except as stated above  PHYSICAL EXAM: BP (!) 145/85   Pulse 80   Resp 16   Wt (!) 314 lb (142.4 kg)   SpO2 93%  BMI 54.75 kg/m   Wt Readings from Last 3 Encounters:  08/11/21 (!) 314 lb (142.4 kg)  05/10/21 (!) 318 lb 12.8 oz (144.6 kg)  02/26/20 (!) 320 lb 6.4 oz (145.3 kg)    Physical Exam General appearance - alert, well appearing, and in no distress Mental status - normal mood, behavior, speech, dress, motor activity, and thought processes Neck - supple, no significant adenopathy Chest - clear to auscultation, no wheezes, rales or rhonchi, symmetric air entry Heart - normal rate, regular rhythm, normal S1, S2, no murmurs, rubs, clicks or gallops Musculoskeletal -knees: Large body habitus.  She has mild discomfort with passive range of motion of both knee joints. Extremities -no lower extremity edema.  Lab Results  Component Value Date   HGBA1C 5.8 05/10/2021    CMP Latest Ref Rng & Units 05/10/2021 02/26/2020 12/03/2019  Glucose 65 - 99 mg/dL 113(H) 75 88  BUN 6 - 24 mg/dL _0 Creatinine 0.57 - 1.00 mg/dL 0.87 0.89 0.93  Sodium 134 - 144 mmol/L 142 144 142  Potassium 3.5 - 5.2 mmol/L 4.6 4.2 3.9  Chloride 96 - 106 mmol/L 103 107(H) 108  CO2 20 - 29 mmol/L _1 Calcium 8.7 - 10.2 mg/dL 9.6 9.3 9.2  Total Protein 6.0 - 8.5 g/dL 6.8 - -  Total Bilirubin 0.0 - 1.2 mg/dL 0.2 - -  Alkaline Phos 44 - 121 IU/L 103 - -  AST 0 - 40 IU/L 13 - -  ALT 0 - 32 IU/L 11 - -   Lipid Panel     Component Value Date/Time   CHOL 140 05/10/2021 1432   TRIG 53 05/10/2021 1432    HDL 49 05/10/2021 1432   CHOLHDL 2.9 05/10/2021 1432   CHOLHDL 3.4 01/20/2015 2241   VLDL 15 01/20/2015 2241   LDLCALC 80 05/10/2021 1432    CBC    Component Value Date/Time   WBC 9.0 05/10/2021 1432   WBC 7.0 12/03/2019 1302   RBC 4.30 05/10/2021 1432   RBC 4.65 12/03/2019 1302   HGB 11.1 05/10/2021 1432   HCT 36.6 05/10/2021 1432   PLT 371 05/10/2021 1432   MCV 85 05/10/2021 1432   MCH 25.8 (L) 05/10/2021 1432   MCH 24.9 (L) 12/03/2019 1302   MCHC 30.3 (L) 05/10/2021 1432   MCHC 29.9 (L) 12/03/2019 1302   RDW 15.3 05/10/2021 1432   LYMPHSABS 2.3 05/10/2021 1432   MONOABS 516 12/20/2016 1048   EOSABS 0.2 05/10/2021 1432   BASOSABS 0.0 05/10/2021 1432    ASSESSMENT AND PLAN: 1. Type 2 diabetes mellitus with morbid obesity ([place]) -Diabetes is controlled.  She will continue metformin. Discussed and encourage healthy eating habits.  Advised to eliminate sugary drinks from the diet.  2. Essential hypertension Not at goal.  She was encouraged to take the lisinopril consistently.    3. Pruritic intertrigo - fluconazole (DIFLUCAN) 150 MG tablet; Take one tablet by mouth once monthly  Dispense: 1 tablet; Refill: 6 - nystatin-triamcinolone (MYCOLOG II) cream; Apply 1 application topically 2 (two) times daily.  Dispense: 60 g; Refill: 0  4. Lumbar radiculopathy Patient will apply for the [employer] card[employer] discount and once approved we can order the MRI.  5. Primary osteoarthritis of both knees Continue Cymbalta and meloxicam.  She still has refills.  6. Influenza vaccination declined Recommended.  Patient declined.  7. Pneumococcal vaccination declined Recommended.  Patient declined.  8. Screening for colon cancer - Fecal occult blood, imunochemical([facility])  Patient was given the opportunity to ask questions.  Patient verbalized understanding of the plan and was able to repeat key elements of the plan.   Orders Placed This Encounter  Procedures   Fecal  occult blood, imunochemical([facility])      Requested Prescriptions   Signed Prescriptions Disp Refills   fluconazole (DIFLUCAN) 150 MG tablet 1 tablet 6    Sig: Take one tablet by mouth once monthly   nystatin-triamcinolone (MYCOLOG II) cream 60 g 0    Sig: Apply 1 application topically 2 (two) times daily.    Return in about 4 months (around 12/09/2021).  Karle Plumber, MD, FACP

## 2021-08-14 ENCOUNTER — Other Ambulatory Visit: Payer: Self-pay

## 2021-08-18 ENCOUNTER — Other Ambulatory Visit: Payer: Self-pay

## 2021-08-21 ENCOUNTER — Other Ambulatory Visit: Payer: Self-pay

## 2021-08-21 ENCOUNTER — Ambulatory Visit: Payer: Medicaid Other

## 2021-08-23 ENCOUNTER — Other Ambulatory Visit: Payer: Self-pay

## 2021-09-18 ENCOUNTER — Inpatient Hospital Stay (HOSPITAL_COMMUNITY)
Admission: EM | Admit: 2021-09-18 | Discharge: 2021-09-26 | DRG: 202 | Disposition: A | Discharge status: Home / Self Care | Payer: Self-pay | Attending: Internal Medicine | Admitting: Internal Medicine

## 2021-09-18 ENCOUNTER — Encounter (HOSPITAL_COMMUNITY): Payer: Self-pay

## 2021-09-18 ENCOUNTER — Other Ambulatory Visit: Payer: Self-pay

## 2021-09-18 DIAGNOSIS — F17210 Nicotine dependence, cigarettes, uncomplicated: Secondary | ICD-10-CM | POA: Diagnosis present

## 2021-09-18 DIAGNOSIS — I119 Hypertensive heart disease without heart failure: Secondary | ICD-10-CM | POA: Diagnosis present

## 2021-09-18 DIAGNOSIS — J9601 Acute respiratory failure with hypoxia: Secondary | ICD-10-CM | POA: Diagnosis present

## 2021-09-18 DIAGNOSIS — E785 Hyperlipidemia, unspecified: Secondary | ICD-10-CM | POA: Diagnosis present

## 2021-09-18 DIAGNOSIS — N938 Other specified abnormal uterine and vaginal bleeding: Secondary | ICD-10-CM | POA: Diagnosis present

## 2021-09-18 DIAGNOSIS — Z825 Family history of asthma and other chronic lower respiratory diseases: Secondary | ICD-10-CM

## 2021-09-18 DIAGNOSIS — R0781 Pleurodynia: Secondary | ICD-10-CM | POA: Diagnosis present

## 2021-09-18 DIAGNOSIS — J4540 Moderate persistent asthma, uncomplicated: Secondary | ICD-10-CM

## 2021-09-18 DIAGNOSIS — Z20822 Contact with and (suspected) exposure to covid-19: Secondary | ICD-10-CM | POA: Diagnosis present

## 2021-09-18 DIAGNOSIS — T380X5A Adverse effect of glucocorticoids and synthetic analogues, initial encounter: Secondary | ICD-10-CM | POA: Diagnosis present

## 2021-09-18 DIAGNOSIS — Z79899 Other long term (current) drug therapy: Secondary | ICD-10-CM

## 2021-09-18 DIAGNOSIS — E119 Type 2 diabetes mellitus without complications: Secondary | ICD-10-CM

## 2021-09-18 DIAGNOSIS — M542 Cervicalgia: Secondary | ICD-10-CM | POA: Diagnosis present

## 2021-09-18 DIAGNOSIS — Z7984 Long term (current) use of oral hypoglycemic drugs: Secondary | ICD-10-CM

## 2021-09-18 DIAGNOSIS — H548 Legal blindness, as defined in USA: Secondary | ICD-10-CM | POA: Diagnosis present

## 2021-09-18 DIAGNOSIS — J4541 Moderate persistent asthma with (acute) exacerbation: Principal | ICD-10-CM | POA: Diagnosis present

## 2021-09-18 DIAGNOSIS — Z6841 Body Mass Index (BMI) 40.0 and over, adult: Secondary | ICD-10-CM

## 2021-09-18 DIAGNOSIS — E8881 Metabolic syndrome: Secondary | ICD-10-CM | POA: Diagnosis present

## 2021-09-18 DIAGNOSIS — R0902 Hypoxemia: Secondary | ICD-10-CM

## 2021-09-18 DIAGNOSIS — J45901 Unspecified asthma with (acute) exacerbation: Secondary | ICD-10-CM | POA: Diagnosis present

## 2021-09-18 DIAGNOSIS — E876 Hypokalemia: Secondary | ICD-10-CM

## 2021-09-18 DIAGNOSIS — F32A Depression, unspecified: Secondary | ICD-10-CM | POA: Diagnosis present

## 2021-09-18 DIAGNOSIS — I10 Essential (primary) hypertension: Secondary | ICD-10-CM | POA: Diagnosis present

## 2021-09-18 DIAGNOSIS — G4700 Insomnia, unspecified: Secondary | ICD-10-CM | POA: Diagnosis present

## 2021-09-18 MED ORDER — IPRATROPIUM-ALBUTEROL 0.5-2.5 (3) MG/3ML IN SOLN
3.0000 mL | Freq: Once | RESPIRATORY_TRACT | Status: AC
  Administered 2021-09-19: 3 mL via RESPIRATORY_TRACT
  Filled 2021-09-18: qty 3

## 2021-09-18 MED ORDER — METHYLPREDNISOLONE SODIUM SUCC 125 MG IJ SOLR
125.0000 mg | Freq: Once | INTRAMUSCULAR | Status: AC
  Administered 2021-09-19: 125 mg via INTRAVENOUS
  Filled 2021-09-18: qty 2

## 2021-09-18 NOTE — ED Triage Notes (Signed)
Pt presents to the ED from home with complaints of SOB for the last 4 days denies fever. Pt has had cough, productive green sputum production. Pt having pain under ribs from coughing. Insp and Exp wheezing noted with audible wheezing noted.

## 2021-09-18 NOTE — ED Provider Notes (Signed)
Emergency Medicine Provider Triage Evaluation Note  Hannah Baldwin , a 50 y.o. female  was evaluated in triage.  Pt complains of cough, shortness of breath x1 week.  Using inhaler every hour without relief.  Coughing up yellow sputum.  Feels short of breath, chest tightness.  Has been admitted previously for asthma/COPD however no prior intubations per patient  Review of Systems  Positive: Cough, shortness of breath, wheeze Negative: Fever, emesis  Physical Exam  BP (!) 181/112 (BP Location: Right Wrist)   Pulse (!) 110   Temp 98.9 F (37.2 C)   Resp 20   SpO2 93%  Gen:   Awake, no distress   Resp:  Increased effort, inspiratory and expiratory wheeze, speaks in short sentences MSK:   Moves extremities without difficulty  Other:    Medical Decision Making  Medically screening exam initiated at 11:50 PM.  Appropriate orders placed.  Derek Huneycutt Forgione was informed that the remainder of the evaluation will be completed by another provider, this initial triage assessment does not replace that evaluation, and the importance of remaining in the ED until their evaluation is complete.  SOB, Charge RN aware, patient needs room in back   [place], Elliette Seabolt A, PA-C 48/18/59 0931    Delora Fuel, MD 09/23/23 [phone]

## 2021-09-19 ENCOUNTER — Emergency Department (HOSPITAL_COMMUNITY): Payer: Self-pay

## 2021-09-19 DIAGNOSIS — J45901 Unspecified asthma with (acute) exacerbation: Secondary | ICD-10-CM | POA: Diagnosis present

## 2021-09-19 DIAGNOSIS — E876 Hypokalemia: Secondary | ICD-10-CM

## 2021-09-19 LAB — CBC WITH DIFFERENTIAL/PLATELET
Abs Immature Granulocytes: 0.11 10*3/uL — ABNORMAL HIGH (ref 0.00–0.07)
Basophils Absolute: 0 10*3/uL (ref 0.0–0.1)
Basophils Relative: 0 %
Eosinophils Absolute: 0.1 10*3/uL (ref 0.0–0.5)
Eosinophils Relative: 1 %
HCT: 34.6 % — ABNORMAL LOW (ref 36.0–46.0)
Hemoglobin: 10.5 g/dL — ABNORMAL LOW (ref 12.0–15.0)
Immature Granulocytes: 1 %
Lymphocytes Relative: 17 %
Lymphs Abs: 2.5 10*3/uL (ref 0.7–4.0)
MCH: 26.2 pg (ref 26.0–34.0)
MCHC: 30.3 g/dL (ref 30.0–36.0)
MCV: 86.3 fL (ref 80.0–100.0)
Monocytes Absolute: 0.9 10*3/uL (ref 0.1–1.0)
Monocytes Relative: 6 %
Neutro Abs: 11.3 10*3/uL — ABNORMAL HIGH (ref 1.7–7.7)
Neutrophils Relative %: 75 %
Platelets: 385 10*3/uL (ref 150–400)
RBC: 4.01 MIL/uL (ref 3.87–5.11)
RDW: 15.4 % (ref 11.5–15.5)
WBC: 15 10*3/uL — ABNORMAL HIGH (ref 4.0–10.5)
nRBC: 0 % (ref 0.0–0.2)

## 2021-09-19 LAB — I-STAT BETA HCG BLOOD, ED (MC, WL, AP ONLY): I-stat hCG, quantitative: <5 m[IU]/mL (ref <5)

## 2021-09-19 LAB — CBC
HCT: 39.1 % (ref 36.0–46.0)
Hemoglobin: 11.7 g/dL — ABNORMAL LOW (ref 12.0–15.0)
MCH: 26.1 pg (ref 26.0–34.0)
MCHC: 29.9 g/dL — ABNORMAL LOW (ref 30.0–36.0)
MCV: 87.3 fL (ref 80.0–100.0)
Platelets: 374 10*3/uL (ref 150–400)
RBC: 4.48 MIL/uL (ref 3.87–5.11)
RDW: 15.4 % (ref 11.5–15.5)
WBC: 18.2 10*3/uL — ABNORMAL HIGH (ref 4.0–10.5)
nRBC: 0 % (ref 0.0–0.2)

## 2021-09-19 LAB — BASIC METABOLIC PANEL
Anion gap: 7 (ref 5–15)
BUN: 10 mg/dL (ref 6–20)
CO2: 26 mmol/L (ref 22–32)
Calcium: 8.9 mg/dL (ref 8.9–10.3)
Chloride: 105 mmol/L (ref 98–111)
Creatinine, Ser: 0.72 mg/dL (ref 0.44–1.00)
GFR, Estimated: >60 mL/min (ref >60)
Glucose, Bld: 136 mg/dL — ABNORMAL HIGH (ref 70–99)
Potassium: 3.1 mmol/L — ABNORMAL LOW (ref 3.5–5.1)
Sodium: 138 mmol/L (ref 135–145)

## 2021-09-19 LAB — RESP PANEL BY RT-PCR (FLU A&B, COVID) ARPGX2
Influenza A by PCR: NEGATIVE
Influenza B by PCR: NEGATIVE
SARS Coronavirus 2 by RT PCR: NEGATIVE

## 2021-09-19 LAB — HIV ANTIBODY (ROUTINE TESTING W REFLEX): HIV Screen 4th Generation wRfx: NONREACTIVE

## 2021-09-19 LAB — HEMOGLOBIN A1C
Hgb A1c MFr Bld: 6.1 % — ABNORMAL HIGH (ref 4.8–5.6)
Mean Plasma Glucose: 128.37 mg/dL

## 2021-09-19 LAB — CBG MONITORING, ED
Glucose-Capillary: 122 mg/dL — ABNORMAL HIGH (ref 70–99)
Glucose-Capillary: 155 mg/dL — ABNORMAL HIGH (ref 70–99)

## 2021-09-19 LAB — GLUCOSE, CAPILLARY: Glucose-Capillary: 172 mg/dL — ABNORMAL HIGH (ref 70–99)

## 2021-09-19 MED ORDER — ENOXAPARIN SODIUM 80 MG/0.8ML IJ SOSY
65.0000 mg | PREFILLED_SYRINGE | INTRAMUSCULAR | Status: DC
  Administered 2021-09-20 – 2021-09-26 (×7): 65 mg via SUBCUTANEOUS
  Filled 2021-09-19 (×2): qty 0.8
  Filled 2021-09-19: qty 0.65
  Filled 2021-09-19 (×5): qty 0.8

## 2021-09-19 MED ORDER — IPRATROPIUM-ALBUTEROL 0.5-2.5 (3) MG/3ML IN SOLN
3.0000 mL | Freq: Four times a day (QID) | RESPIRATORY_TRACT | Status: DC
  Administered 2021-09-19 – 2021-09-20 (×6): 3 mL via RESPIRATORY_TRACT
  Filled 2021-09-19 (×6): qty 3

## 2021-09-19 MED ORDER — INSULIN ASPART 100 UNIT/ML IJ SOLN: 0.0000 [IU] | Freq: Three times a day (TID) | INTRAMUSCULAR | Status: DC

## 2021-09-19 MED ORDER — HYDRALAZINE HCL 20 MG/ML IJ SOLN
5.0000 mg | INTRAMUSCULAR | Status: DC | PRN
  Administered 2021-09-19: 5 mg via INTRAVENOUS
  Filled 2021-09-19 (×2): qty 1

## 2021-09-19 MED ORDER — METFORMIN HCL 500 MG PO TABS
500.0000 mg | ORAL_TABLET | Freq: Two times a day (BID) | ORAL | Status: DC
  Administered 2021-09-19 – 2021-09-26 (×14): 500 mg via ORAL
  Filled 2021-09-19 (×15): qty 1

## 2021-09-19 MED ORDER — ALBUTEROL SULFATE (2.5 MG/3ML) 0.083% IN NEBU
2.5000 mg | INHALATION_SOLUTION | RESPIRATORY_TRACT | Status: DC | PRN
  Administered 2021-09-20: 06:00:00 2.5 mg via RESPIRATORY_TRACT
  Filled 2021-09-19: qty 3

## 2021-09-19 MED ORDER — IPRATROPIUM-ALBUTEROL 0.5-2.5 (3) MG/3ML IN SOLN
3.0000 mL | Freq: Once | RESPIRATORY_TRACT | Status: AC
  Administered 2021-09-19: 3 mL via RESPIRATORY_TRACT
  Filled 2021-09-19: qty 3

## 2021-09-19 MED ORDER — BUDESONIDE 0.25 MG/2ML IN SUSP
0.2500 mg | Freq: Two times a day (BID) | RESPIRATORY_TRACT | Status: DC
Start: 2021-09-19 — End: 2021-09-20
  Administered 2021-09-19 – 2021-09-20 (×3): 0.25 mg via RESPIRATORY_TRACT
  Filled 2021-09-19 (×6): qty 2

## 2021-09-19 MED ORDER — ACETAMINOPHEN 650 MG RE SUPP: 650.0000 mg | Freq: Four times a day (QID) | RECTAL | Status: DC | PRN

## 2021-09-19 MED ORDER — METHOCARBAMOL 500 MG PO TABS
1000.0000 mg | ORAL_TABLET | Freq: Three times a day (TID) | ORAL | Status: DC | PRN
  Administered 2021-09-19 – 2021-09-26 (×5): 1000 mg via ORAL
  Filled 2021-09-19 (×5): qty 2

## 2021-09-19 MED ORDER — TRAZODONE HCL 100 MG PO TABS
100.0000 mg | ORAL_TABLET | Freq: Every evening | ORAL | Status: DC | PRN
  Administered 2021-09-19 – 2021-09-24 (×4): 100 mg via ORAL
  Filled 2021-09-19 (×4): qty 1

## 2021-09-19 MED ORDER — MAGNESIUM SULFATE 2 GM/50ML IV SOLN
2.0000 g | Freq: Once | INTRAVENOUS | Status: AC
  Administered 2021-09-19: 2 g via INTRAVENOUS
  Filled 2021-09-19: qty 50

## 2021-09-19 MED ORDER — ACETAMINOPHEN 500 MG PO TABS: 1000.0000 mg | ORAL_TABLET | Freq: Four times a day (QID) | ORAL | Status: DC | PRN

## 2021-09-19 MED ORDER — POTASSIUM CHLORIDE CRYS ER 20 MEQ PO TBCR
40.0000 meq | EXTENDED_RELEASE_TABLET | Freq: Once | ORAL | Status: AC
  Administered 2021-09-19: 40 meq via ORAL
  Filled 2021-09-19: qty 2

## 2021-09-19 MED ORDER — ENOXAPARIN SODIUM 40 MG/0.4ML IJ SOSY
40.0000 mg | PREFILLED_SYRINGE | INTRAMUSCULAR | Status: DC
  Filled 2021-09-19: qty 0.4

## 2021-09-19 MED ORDER — METHYLPREDNISOLONE SODIUM SUCC 125 MG IJ SOLR
120.0000 mg | Freq: Two times a day (BID) | INTRAMUSCULAR | Status: DC
  Administered 2021-09-19 – 2021-09-20 (×3): 120 mg via INTRAVENOUS
  Filled 2021-09-19 (×3): qty 2

## 2021-09-19 MED ORDER — HYDROCHLOROTHIAZIDE 25 MG PO TABS
25.0000 mg | ORAL_TABLET | Freq: Every day | ORAL | Status: DC
  Administered 2021-09-19 – 2021-09-26 (×8): 25 mg via ORAL
  Filled 2021-09-19 (×8): qty 1

## 2021-09-19 MED ORDER — INSULIN ASPART 100 UNIT/ML IJ SOLN: 0.0000 [IU] | Freq: Every day | INTRAMUSCULAR | Status: DC

## 2021-09-19 MED ORDER — HYDROCOD POLST-CPM POLST ER 10-8 MG/5ML PO SUER
5.0000 mL | Freq: Two times a day (BID) | ORAL | Status: DC | PRN
  Administered 2021-09-19 – 2021-09-26 (×9): 5 mL via ORAL
  Filled 2021-09-19 (×10): qty 5

## 2021-09-19 MED ORDER — SIMETHICONE 80 MG PO CHEW: 80.0000 mg | CHEWABLE_TABLET | Freq: Four times a day (QID) | ORAL | Status: DC | PRN

## 2021-09-19 MED ORDER — DULOXETINE HCL 60 MG PO CPEP
60.0000 mg | ORAL_CAPSULE | Freq: Every day | ORAL | Status: DC
  Administered 2021-09-19 – 2021-09-26 (×8): 60 mg via ORAL
  Filled 2021-09-19 (×9): qty 1

## 2021-09-19 MED ORDER — KETOROLAC TROMETHAMINE 15 MG/ML IJ SOLN
15.0000 mg | Freq: Four times a day (QID) | INTRAMUSCULAR | Status: DC
  Administered 2021-09-19 – 2021-09-20 (×6): 15 mg via INTRAVENOUS
  Filled 2021-09-19 (×6): qty 1

## 2021-09-19 MED ORDER — ACETAMINOPHEN 325 MG PO TABS
650.0000 mg | ORAL_TABLET | Freq: Four times a day (QID) | ORAL | Status: DC | PRN
  Administered 2021-09-19: 650 mg via ORAL
  Filled 2021-09-19: qty 2

## 2021-09-19 MED ORDER — MOMETASONE FURO-FORMOTEROL FUM 100-5 MCG/ACT IN AERO
2.0000 | INHALATION_SPRAY | Freq: Two times a day (BID) | RESPIRATORY_TRACT | Status: DC
  Administered 2021-09-19 – 2021-09-20 (×2): 2 via RESPIRATORY_TRACT
  Filled 2021-09-19: qty 8.8

## 2021-09-19 MED ORDER — MEGESTROL ACETATE 40 MG PO TABS
40.0000 mg | ORAL_TABLET | Freq: Two times a day (BID) | ORAL | Status: DC
  Administered 2021-09-19 – 2021-09-26 (×15): 40 mg via ORAL
  Filled 2021-09-19 (×16): qty 1

## 2021-09-19 MED ORDER — LISINOPRIL 10 MG PO TABS
10.0000 mg | ORAL_TABLET | Freq: Every day | ORAL | Status: DC
  Administered 2021-09-19 – 2021-09-20 (×2): 10 mg via ORAL
  Filled 2021-09-19 (×2): qty 1

## 2021-09-19 MED ORDER — DM-GUAIFENESIN ER 30-600 MG PO TB12
1.0000 | ORAL_TABLET | Freq: Two times a day (BID) | ORAL | Status: DC
  Administered 2021-09-19 – 2021-09-20 (×3): 1 via ORAL
  Filled 2021-09-19 (×4): qty 1

## 2021-09-19 NOTE — ED Provider Notes (Addendum)
[facility] EMERGENCY DEPARTMENT Provider Note   CSN: [id number] Arrival date & time: 09/18/21  2334     History Chief Complaint  Patient presents with   Shortness of Breath    Hannah Baldwin is a 50 y.o. female.  HPI Patient is a 50 year old female with a history of asthma, diabetes mellitus, hypertension, who presents to the emergency department due to shortness of breath/wheezing.  Patient states her symptoms began worsening about 2 days ago.  Reports a productive cough with green sputum.  Also notes moderate chest tightness.  No chest pain.  No abdominal pain, nausea, vomiting.  Patient states that she quit smoking about 6 months ago but reports persistent passive smoke exposure with multiple relatives.    Past Medical History:  Diagnosis Date   Anemia    Asthma Dx 2014   Diabetes mellitus without complication ([place])    Legally blind in left eye, as defined in [place]    since birth    Pneumonia ~ 2000 X 1    Patient Active Problem List   Diagnosis Date Noted   Essential hypertension 02/26/2020   Muscle cramp 02/26/2020   Fibroid uterus 01/09/2018   Primary osteoarthritis of left knee 04/09/2017   Insomnia 11/03/2015   Onychomycosis of toenail 07/25/2015   Diabetic neuropathy ([place]) 07/25/2015   DUB (dysfunctional uterine bleeding) 02/03/2015   Diabetes type 2, controlled ([place]) 01/25/2015   Severe obesity (BMI >= 40) ([place]) 01/25/2015   Moderate persistent asthma 01/21/2015   Cardiomegaly 06/09/2014   Hypochromic microcytic anemia 06/08/2014   Former smoker 01/01/2013    Past Surgical History:  Procedure Laterality Date   CESAREAN SECTION  1992     OB History     Gravida  2   Para  2   Term  0   Preterm  0   AB  0   Living  2      SAB  0   IAB  0   Ectopic  0   Multiple  0   Live Births  2           Family History  Problem Relation Age of Onset   Asthma Mother    Asthma Daughter     Social History   Tobacco  Use   Smoking status: Former    Packs/day: 0.25    Years: 20.00    Pack years: 5.00    Types: Cigarettes    Quit date: 01/18/2015    Years since quitting: 6.6   Smokeless tobacco: Never  Substance Use Topics   Alcohol use: Yes    Comment: 01/20/2015 "dank some alcohol on my birthday, cookouts,  etc,;  maybe once/month"   Drug use: No    Home Medications Prior to Admission medications   Medication Sig Start Date End Date Taking? Authorizing Provider  acetaminophen (TYLENOL) 500 MG tablet Take 1,000 mg by mouth every 6 (six) hours as needed for moderate pain or headache.   Yes [provider]  albuterol (PROVENTIL) (2.5 MG/3ML) 0.083% nebulizer solution Take 3 mLs (2.5 mg total) by nebulization every 6 (six) hours as needed for wheezing or shortness of breath. 05/10/21  Yes Argentina Donovan, PA-C  albuterol (VENTOLIN HFA) 108 (90 Base) MCG/ACT inhaler Inhale 2 puffs into the lungs every 6 (six) hours as needed for wheezing or shortness of breath. 05/10/21  Yes Freeman Caldron M, PA-C  blood glucose meter kit and supplies KIT Dispense based on patient and insurance  preference. Use up to four times daily as directed. (FOR ICD-9 250.00, 250.01). 01/25/15  Yes Charlott Rakes, MD  Blood Glucose Monitoring Suppl (TRUE METRIX METER) w/Device KIT USE AS INSTRUCTED 09/24/17  Yes Ladell Pier, MD  DULoxetine (CYMBALTA) 60 MG capsule Take 1 capsule (60 mg total) by mouth daily. 05/10/21  Yes Freeman Caldron M, PA-C  fluconazole (DIFLUCAN) 150 MG tablet Take one tablet by mouth once monthly Patient taking differently: Take 150 mg by mouth See admin instructions. Once a month as needed 08/11/21  Yes Ladell Pier, MD  glucose blood (TRUE METRIX BLOOD GLUCOSE TEST) test strip Use as instructed 09/24/17  Yes Ladell Pier, MD  HYDROcodone-acetaminophen (NORCO/VICODIN) 5-325 MG tablet Take 1 tablet by mouth every 8 (eight) hours as needed for moderate pain. 04/10/21  Yes Rosemarie Ax, MD   lidocaine (LIDODERM) 5 % Place 1 patch onto the skin daily as needed. Apply patch to area most significant pain once per day.  Remove and discard patch within 12 hours of application. Patient taking differently: Place 1 patch onto the skin daily as needed (pain). 03/19/21  Yes Petrucelli, Samantha R, PA-C  lisinopril (ZESTRIL) 10 MG tablet Take 1 tablet (10 mg total) by mouth daily. 05/10/21  Yes McClung, Angela M, PA-C  megestrol (MEGACE) 40 MG tablet TAKE 1 TABLET (40 MG TOTAL) BY MOUTH 2 (TWO) TIMES DAILY. Patient taking differently: Take 40 mg by mouth 2 (two) times daily. 03/02/21 03/02/22 Yes Ladell Pier, MD  meloxicam (MOBIC) 15 MG tablet TAKE 1 TABLET (15 MG TOTAL) BY MOUTH DAILY. Patient taking differently: Take 15 mg by mouth daily. 05/10/21 05/10/22 Yes McClung, Dionne Bucy, PA-C  metFORMIN (GLUCOPHAGE) 500 MG tablet Take 1 tablet (500 mg total) by mouth 2 (two) times daily. 05/10/21  Yes Argentina Donovan, PA-C  methocarbamol (ROBAXIN) 500 MG tablet Take 2 tablets (1,000 mg total) by mouth every 8 (eight) hours as needed for muscle spasms. 05/10/21  Yes McClung, Angela M, PA-C  mometasone-formoterol (DULERA) 100-5 MCG/ACT AERO Inhale 2 puffs into the lungs 2 (two) times daily. 05/10/21  Yes McClung, Angela M, PA-C  simethicone (MYLICON) 80 MG chewable tablet Chew 80-160 mg by mouth every 6 (six) hours as needed for flatulence.   Yes [provider]  traZODone (DESYREL) 100 MG tablet Take 1 tablet (100 mg total) by mouth at bedtime as needed for sleep. Patient taking differently: Take 100 mg by mouth at bedtime. 05/10/21 05/10/22 Yes McClung, Dionne Bucy, PA-C  TRUEPLUS LANCETS 28G MISC Check blood sugar TID & QHS 09/24/17  Yes Ladell Pier, MD  carbamide peroxide (DEBROX) 6.5 % OTIC solution Place 5 drops into the right ear 2 (two) times daily. Patient not taking: Reported on 09/19/2021 04/04/20   Camillia Herter, NP  lidocaine (LIDODERM) 5 % place 1 patch onto the skin daily as needed.  (remove and  discard patch within 12 hours of application) 12/07/89   McClung, Dionne Bucy, PA-C  nystatin-triamcinolone (MYCOLOG II) cream Apply 1 application topically 2 (two) times daily. Patient not taking: Reported on 09/19/2021 08/11/21   Ladell Pier, MD  Vitamin D, Ergocalciferol, (DRISDOL) 1.25 MG (50000 UT) CAPS capsule Take 1 capsule (50,000 Units total) by mouth every 7 (seven) days. Patient not taking: Reported on 09/19/2021 09/29/19   Argentina Donovan, PA-C    Allergies    Patient has no known allergies.  Review of Systems   Review of Systems  All other systems reviewed  and are negative. Ten systems reviewed and are negative for acute change, except as noted in the HPI.   Physical Exam Updated Vital Signs BP (!) 151/94   Pulse 90   Temp 98.9 F (37.2 C)   Resp (!) 29   Ht '5\' 3"'  (1.6 m)   Wt (!) 137 kg   SpO2 100%   BMI 53.50 kg/m   Physical Exam Vitals and nursing note reviewed.  Constitutional:      General: She is not in acute distress.    Appearance: Normal appearance. She is not ill-appearing, toxic-appearing or diaphoretic.     Interventions: She is not intubated. HENT:     Head: Normocephalic and atraumatic.     Right Ear: External ear normal.     Left Ear: External ear normal.     Nose: Nose normal.     Mouth/Throat:     Mouth: Mucous membranes are moist.     Pharynx: Oropharynx is clear. No oropharyngeal exudate or posterior oropharyngeal erythema.  Eyes:     Extraocular Movements: Extraocular movements intact.  Cardiovascular:     Rate and Rhythm: Regular rhythm. Tachycardia present.     Pulses: Normal pulses.     Heart sounds: Normal heart sounds. No murmur heard.   No friction rub. No gallop.  Pulmonary:     Effort: Tachypnea and respiratory distress present. No accessory muscle usage. She is not intubated.     Breath sounds: No stridor. Examination of the right-upper field reveals wheezing. Examination of the left-upper field reveals  wheezing. Examination of the right-middle field reveals wheezing. Examination of the left-middle field reveals wheezing. Examination of the right-lower field reveals wheezing. Examination of the left-lower field reveals wheezing. Wheezing present. No decreased breath sounds, rhonchi or rales.     Comments: Tachypneic and speaking in short sentences.  Inspiratory and expiratory wheezes noted in all lung fields. Abdominal:     General: Abdomen is flat.     Palpations: Abdomen is soft.     Tenderness: There is no abdominal tenderness.     Comments: Protuberant abdomen that is soft and nontender.  Musculoskeletal:        General: Normal range of motion.     Cervical back: Normal range of motion and neck supple. No tenderness.  Skin:    General: Skin is warm and dry.  Neurological:     General: No focal deficit present.     Mental Status: She is alert and oriented to person, place, and time.  Psychiatric:        Mood and Affect: Mood normal.        Behavior: Behavior normal.   ED Results / Procedures / Treatments   Labs (all labs ordered are listed, but only abnormal results are displayed) Labs Reviewed  CBC WITH DIFFERENTIAL/PLATELET - Abnormal; Notable for the following components:      Result Value   WBC 15.0 (*)    Hemoglobin 10.5 (*)    HCT 34.6 (*)    Neutro Abs 11.3 (*)    Abs Immature Granulocytes 0.11 (*)    All other components within normal limits  BASIC METABOLIC PANEL - Abnormal; Notable for the following components:   Potassium 3.1 (*)    Glucose, Bld 136 (*)    All other components within normal limits  RESP PANEL BY RT-PCR (FLU A&B, COVID) ARPGX2  I-STAT BETA HCG BLOOD, ED ([facility], [facility], [facility] ONLY)   EKG EKG Interpretation  Date/Time:  Tuesday September 19 2021 00:14:26 EST Ventricular Rate:  96 PR Interval:  125 QRS Duration: 86 QT Interval:  367 QTC Calculation: 444 R Axis:   30 Text Interpretation: Sinus rhythm Supraventricular bigeminy Confirmed by Thayer Jew [phone]) on 09/19/2021 12:16:15 AM  Radiology DG Chest 2 View  Result Date: 09/19/2021 CLINICAL DATA:  Cough, shortness of breath EXAM: CHEST - 2 VIEW COMPARISON:  12/03/2019 FINDINGS: Lungs are clear.  No pleural effusion or pneumothorax. Cardiomegaly. Visualized osseous structures are within normal limits. IMPRESSION: Normal chest radiographs. Electronically Signed   By: Julian Hy M.D.   On: 09/19/2021 01:37    Procedures .Critical Care Performed by: Rayna Sexton, PA-C Authorized by: Rayna Sexton, PA-C   Critical care provider statement:    Critical care time (minutes):  30   Critical care was necessary to treat or prevent imminent or life-threatening deterioration of the following conditions:  Respiratory failure   Critical care was time spent personally by me on the following activities:  Development of treatment plan with patient or surrogate, discussions with consultants, evaluation of patient's response to treatment, examination of patient, ordering and review of laboratory studies, ordering and review of radiographic studies, ordering and performing treatments and interventions, pulse oximetry, re-evaluation of patient's condition and review of old charts   Medications Ordered in ED Medications  ipratropium-albuterol (DUONEB) 0.5-2.5 (3) MG/3ML nebulizer solution 3 mL (3 mLs Nebulization Given 09/19/21 0021)  methylPREDNISolone sodium succinate (SOLU-MEDROL) 125 mg/2 mL injection 125 mg (125 mg Intravenous Given 09/19/21 0028)  magnesium sulfate IVPB 2 g 50 mL (0 g Intravenous Stopped 09/19/21 0147)  ipratropium-albuterol (DUONEB) 0.5-2.5 (3) MG/3ML nebulizer solution 3 mL (3 mLs Nebulization Given 09/19/21 0153)  potassium chloride SA (KLOR-CON M) CR tablet 40 mEq (40 mEq Oral Given 09/19/21 0153)  ipratropium-albuterol (DUONEB) 0.5-2.5 (3) MG/3ML nebulizer solution 3 mL (3 mLs Nebulization Given 09/19/21 0254)   ED Course  I have reviewed the triage vital  signs and the nursing notes.  Pertinent labs & imaging results that were available during my care of the patient were reviewed by me and considered in my medical decision making (see chart for details).  Clinical Course as of 09/19/21 0324  Tue Sep 19, 2021  0131 Patient reassessed.  Still endorses shortness of breath/wheezing.  Inspiratory wheezing has improved but patient still exhibiting expiratory wheezes in all lung fields.  We will give an additional DuoNeb and reassess. [LJ]  0216 Patient reassessed.  She notes mild improvement in her symptoms.  Still exhibiting expiratory wheezing in all lung fields.  We will give an additional DuoNeb.  If little improvement will admit. [LJ]    Clinical Course User Index [LJ] Rayna Sexton, PA-C   MDM Rules/Calculators/A&P                          Pt is a 50 y.o. female with a history of asthma who presents to the emergency department due to wheezing and shortness of breath started about 2 days ago.  Patient states she had been using her inhaler at home as well as her home nebulizer without significant relief.  Labs: CBC with white count of 15, hemoglobin of 10.5, neutrophils of 11.3, absolute immature granulocytes of 0.11. BMP with a potassium of 3.1 and a glucose of 136.  Potassium repleted with Klor-Con. Respiratory panel is negative. I-STAT beta-hCG less than 5.  Imaging: Chest x-ray is negative.  I, Rayna Sexton, PA-C, personally reviewed and evaluated these images  and lab results as part of my medical decision-making.  Patient with a history of asthma.  Worsening symptoms on her home regimen.  On my exam initially patient is in respiratory distress and tachypneic.  Inspiratory and expiratory wheezes noted in all lung fields.  She was treated with Solu-Medrol, IV magnesium, as well as multiple duo nebs.  Wheezing is improved but patient still appears short of breath with expiratory wheezes in all lung fields.  Discussed the possibility  of admission with the patient and she is amenable.  Feel that this is reasonable.  We will discuss with the medicine team.  Note: Portions of this report may have been transcribed using voice recognition software. Every effort was made to ensure accuracy; however, inadvertent computerized transcription errors may be present.   Final Clinical Impression(s) / ED Diagnoses Final diagnoses:  Moderate persistent asthma with acute exacerbation   Rx / DC Orders ED Discharge Orders     None        Rayna Sexton, PA-C 09/19/21 0325    Rayna Sexton, PA-C 09/20/21 [id number]    Merryl Hacker, MD 09/20/21 [phone]

## 2021-09-19 NOTE — ED Notes (Signed)
Patient using restroom. Patient cleaning self at sink as well

## 2021-09-19 NOTE — Progress Notes (Signed)
Patient arrived to [place] room 30 alert and oriented x4. Bed in lowest position. Call light in reach

## 2021-09-19 NOTE — Progress Notes (Signed)
PROGRESS NOTE  Hannah Baldwin WYO:[id number] DOB: 01/05/1971 DOA: 09/18/2021 PCP: Ladell Pier, MD  Brief History   Hannah Baldwin is a 50 y.o. female with medical history significant of asthma, non-insulin-dependent type II diabetes, depression, morbid obesity presenting to the ED complaining of cough, shortness of breath, and wheezing.  On arrival to the ED, she was in respiratory distress.  Wheezing and tachypneic.  Not hypoxic.  She was treated with Solu-Medrol 125 mg, IV magnesium 2 g, and DuoNeb x3.  Not febrile.  Labs showing WBC 15.0.  Hemoglobin 10.5, no significant change from baseline.  Potassium 3.1.  COVID and flu negative.  Chest x-ray negative for acute finding.   Patient reports shortness of breath, cough, wheezing, and chest tightness.  Symptoms started 4 days ago and getting progressively worse.  She is using all of her home inhalers and also her nebulizer but not improving.  States she feels very short of breath when walking and even at rest when talking.  She stopped smoking cigarettes 6 months ago.  The patient is complaining of pleuritic chest pain and hypoxia into the 70's with any attempt at activity. She is saturating in the 90's on room air at rest. She is exhibiting 3 word dyspnea with conversation.  Consultants  None  Procedures  None  Antibiotics   Anti-infectives (From admission, onward)    None       Subjective  Although she states that she is feeling better than when she arrived, the patient continues to have severe dyspnea and cough with conversation ans sever hypoxia with any activity.   Objective   Vitals:  Vitals:   09/19/21 1020 09/19/21 1100  BP:  (!) 179/100  Pulse: 95 81  Resp: (!) 26 17  Temp:    SpO2: 98% 96%    Exam:  Exam:  Constitutional:  The patient is awake, alert, and oriented x 3. Moderate distress from dyspnea. Respiratory:  No increased work of breathing. No wheezes, rales, or rhonchi No tactile  fremitus Cardiovascular:  Regular rate and rhythm No murmurs, ectopy, or gallups. No lateral PMI. No thrills. Abdomen:  Abdomen is soft, non-tender, non-distended No hernias, masses, or organomegaly Normoactive bowel sounds.  Musculoskeletal:  No cyanosis, clubbing, or edema Skin:  No rashes, lesions, ulcers palpation of skin: no induration or nodules Neurologic:  CN 2-12 intact Sensation all 4 extremities intact Psychiatric:  Mental status Mood, affect appropriate Orientation to person, place, time  judgment and insight appear intact   I have personally reviewed the following:   Today's Data  vitals  Lab Data  CBC, BMP  Micro Data    Imaging    Cardiology Data    Other Data    Scheduled Meds:  budesonide (PULMICORT) nebulizer solution  0.25 mg Nebulization BID   dextromethorphan-guaiFENesin  1 tablet Oral BID   DULoxetine  60 mg Oral Daily   enoxaparin (LOVENOX) injection  65 mg Subcutaneous Q24H   insulin aspart  0-5 Units Subcutaneous QHS   insulin aspart  0-9 Units Subcutaneous TID WC   ipratropium-albuterol  3 mL Nebulization Q6H   ketorolac  15 mg Intravenous Q6H   lisinopril  10 mg Oral Daily   megestrol  40 mg Oral BID   metFORMIN  500 mg Oral BID WC   methylPREDNISolone (SOLU-MEDROL) injection  120 mg Intravenous Q12H   mometasone-formoterol  2 puff Inhalation BID    Principal Problem:   Asthma exacerbation Active Problems:   Diabetes type  2, controlled ([place])   Essential hypertension   Hypokalemia   LOS: 0 days   A & P  Acute asthma exacerbation Patient complaining of progressively worsening dyspnea, cough, wheezing, and chest tightness x4 days.  She was in respiratory distress on arrival to the ED and was given Solu-Medrol 125 mg, IV magnesium 2 g, and DuoNeb x3.  Chest x-ray negative for acute finding.  COVID and flu negative.  EKG without acute ischemic changes.  Still wheezing but work of breathing has improved.  Not hypoxic. -IV  Solu-Medrol 60 mg every 6 hours, DuoNeb every 6 hours, albuterol neb every 2 hours as needed, Pulmicort neb twice daily, Mucinex DM.  Continuous pulse ox, supplemental oxygen as needed to keep oxygen saturation above 92%.   Mild hypokalemia -Supplement and monitor.   Leukocytosis Likely reactive and in response to steroids.  Not febrile and chest x-ray not suggestive of pneumonia.  Not endorsing any other infectious symptoms. -Repeat CBC in a.m.   Non-insulin-dependent type 2 diabetes Appears to be well controlled - A1c 6.1  on 09/18/2021.  Metformin listed in home medications but patient tells me she is not taking it.  She is requesting regular diet.  -Sensitive sliding scale insulin ACHS   Depression -Continue duloxetine   Hypertension Pt with high BP. Even when systolic is relatively controlled the diastolic is in the 161'W. Will add hydrochlorothiazide. Suspect metabolic syndrome in this morbidly obese woman with DM and HTN on Megace for DUB. -Continue lisinopril.  Add HCTZ.   Morbid obesity (BMI 53.50) -Encourage lifestyle modifications such as healthy diet and exercise. Use of megace for DUB in this morbidly obese woman with hypertension and DM II is not helpful here. Understand that it is important to control of DUB without a procedure such as endometrial ablation, but it should be discontinued as soon as another modality of controlling DUB is possible.  DUB:  Patient has been on Megace for 2 years for this indication. While it is effective in controlling her debilitating DUB, it is contributing to the patient's severe morbid obesity, hypertension, and DM II - metabolic syndrome. She is working on getting Medicaid coverage. Hopefully this will put a procedure such as endometrial ablation within her grasp financially so Megace can be stopped. Will check lipid panel.  I have seen and examined this patient myself. I have spent 38 minutes in her evaluation and care.   DVT prophylaxis:  Lovenox Code Status: Full code Family Communication: No family at bedside. Disposition Plan: Status is: Inpatient. Disposition will be to home.   Level of care: Level of care: Telemetry Medical   The medical decision making on this patient was of high complexity and the patient is at high risk for clinical deterioration, therefore this is a level 3 visit.   Hussein Macdougal, DO [facility]   If 7PM-7AM, please contact night-coverage [email or web address]   09/19/2021, 2:40 PM  Swetha Rayle, DO [facility] Direct contact: see [email or web address]  7PM-7AM contact night coverage as above 09/19/2021, 1:42 PM  LOS: 0 days

## 2021-09-19 NOTE — H&P (Signed)
History and Physical    Hannah Baldwin FHL:[id number] DOB: [birth year] DOA: 09/18/2021  PCP: Ladell Pier, MD Patient coming from: Home  Chief Complaint: Shortness of breath  HPI: Hannah Baldwin is a 50 y.o. female with medical history significant of asthma, non-insulin-dependent type II diabetes, depression, morbid obesity presenting to the ED complaining of cough, shortness of breath, and wheezing.  On arrival to the ED, she was in respiratory distress.  Wheezing and tachypneic.  Not hypoxic.  She was treated with Solu-Medrol 125 mg, IV magnesium 2 g, and DuoNeb x3.  Not febrile.  Labs showing WBC 15.0.  Hemoglobin 10.5, no significant change from baseline.  Potassium 3.1.  COVID and flu negative.  Chest x-ray negative for acute finding.  Patient reports shortness of breath, cough, wheezing, and chest tightness.  Symptoms started 4 days ago and getting progressively worse.  She is using all of her home inhalers and also her nebulizer but not improving.  States she feels very short of breath when walking and even at rest when talking.  She stopped smoking cigarettes 6 months ago.  Review of Systems:  All systems reviewed and apart from history of presenting illness, are negative.  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 Her smoking use included cigarettes. She has a 5.00 pack-year smoking history. She has never used smokeless tobacco. She reports current alcohol use. She reports that she does not use drugs.  No Known Allergies  Family History  Problem Relation Age of Onset   Asthma Mother    Asthma Daughter     Prior to Admission medications   Medication Sig Start Date End Date  Taking? Authorizing Provider  acetaminophen (TYLENOL) 500 MG tablet Take 1,000 mg by mouth every 6 (six) hours as needed for moderate pain or headache.   Yes [provider]  albuterol (PROVENTIL) (2.5 MG/3ML) 0.083% nebulizer solution Take 3 mLs (2.5 mg total) by nebulization every 6 (six) hours as needed for wheezing or shortness of breath. 05/10/21  Yes Argentina Donovan, PA-C  albuterol (VENTOLIN HFA) 108 (90 Base) MCG/ACT inhaler Inhale 2 puffs into the lungs every 6 (six) hours as needed for wheezing or shortness of breath. 05/10/21  Yes Freeman Caldron M, PA-C  blood glucose meter kit and supplies KIT Dispense based on patient and insurance preference. Use up to four times daily as directed. (FOR ICD-9 250.00, 250.01). 01/25/15  Yes Charlott Rakes, MD  Blood Glucose Monitoring Suppl (TRUE METRIX METER) w/Device KIT USE AS INSTRUCTED 09/24/17  Yes Ladell Pier, MD  DULoxetine (CYMBALTA) 60 MG capsule Take 1 capsule (60 mg total) by mouth daily. 05/10/21  Yes Freeman Caldron M, PA-C  fluconazole (DIFLUCAN) 150 MG tablet Take one tablet by mouth once monthly Patient taking differently: Take 150 mg by mouth See admin instructions. Once a month as needed 08/11/21  Yes Ladell Pier, MD  glucose blood (TRUE METRIX BLOOD GLUCOSE TEST) test strip Use as instructed 09/24/17  Yes Ladell Pier, MD  HYDROcodone-acetaminophen (NORCO/VICODIN) 5-325 MG tablet Take 1 tablet by mouth every 8 (eight) hours  as needed for moderate pain. 04/10/21  Yes Rosemarie Ax, MD  lidocaine (LIDODERM) 5 % Place 1 patch onto the skin daily as needed. Apply patch to area most significant pain once per day.  Remove and discard patch within 12 hours of application. Patient taking differently: Place 1 patch onto the skin daily as needed (pain). 03/19/21  Yes Petrucelli, Samantha R, PA-C  lisinopril (ZESTRIL) 10 MG tablet Take 1 tablet (10 mg total) by mouth daily. 05/10/21  Yes McClung, Angela M, PA-C  megestrol  (MEGACE) 40 MG tablet TAKE 1 TABLET (40 MG TOTAL) BY MOUTH 2 (TWO) TIMES DAILY. Patient taking differently: Take 40 mg by mouth 2 (two) times daily. 03/02/21 03/02/22 Yes Ladell Pier, MD  meloxicam (MOBIC) 15 MG tablet TAKE 1 TABLET (15 MG TOTAL) BY MOUTH DAILY. Patient taking differently: Take 15 mg by mouth daily. 05/10/21 05/10/22 Yes McClung, Dionne Bucy, PA-C  metFORMIN (GLUCOPHAGE) 500 MG tablet Take 1 tablet (500 mg total) by mouth 2 (two) times daily. 05/10/21  Yes Argentina Donovan, PA-C  methocarbamol (ROBAXIN) 500 MG tablet Take 2 tablets (1,000 mg total) by mouth every 8 (eight) hours as needed for muscle spasms. 05/10/21  Yes McClung, Angela M, PA-C  mometasone-formoterol (DULERA) 100-5 MCG/ACT AERO Inhale 2 puffs into the lungs 2 (two) times daily. 05/10/21  Yes McClung, Angela M, PA-C  simethicone (MYLICON) 80 MG chewable tablet Chew 80-160 mg by mouth every 6 (six) hours as needed for flatulence.   Yes [provider]  traZODone (DESYREL) 100 MG tablet Take 1 tablet (100 mg total) by mouth at bedtime as needed for sleep. Patient taking differently: Take 100 mg by mouth at bedtime. 05/10/21 05/10/22 Yes McClung, Dionne Bucy, PA-C  TRUEPLUS LANCETS 28G MISC Check blood sugar TID & QHS 09/24/17  Yes Ladell Pier, MD  carbamide peroxide (DEBROX) 6.5 % OTIC solution Place 5 drops into the right ear 2 (two) times daily. Patient not taking: Reported on 09/19/2021 04/04/20   Camillia Herter, NP  lidocaine (LIDODERM) 5 % place 1 patch onto the skin daily as needed. (remove and  discard patch within 12 hours of application) 04/11/16   McClung, Dionne Bucy, PA-C  nystatin-triamcinolone (MYCOLOG II) cream Apply 1 application topically 2 (two) times daily. Patient not taking: Reported on 09/19/2021 08/11/21   Ladell Pier, MD  Vitamin D, Ergocalciferol, (DRISDOL) 1.25 MG (50000 UT) CAPS capsule Take 1 capsule (50,000 Units total) by mouth every 7 (seven) days. Patient not taking: Reported on  09/19/2021 09/29/19   Mathis Dad    Physical Exam: Vitals:   09/19/21 0155 09/19/21 0200 09/19/21 0252 09/19/21 0300  BP: (!) 147/81 137/83 (!) 143/82 (!) 151/94  Pulse: 91 91 81 90  Resp: (!) 26 (!) 29 (!) 22 (!) 29  Temp:      SpO2: 97% 100% 96% 100%  Weight:      Height:        Physical Exam Constitutional:      General: She is not in acute distress. HENT:     Head: Normocephalic and atraumatic.  Eyes:     Extraocular Movements: Extraocular movements intact.     Conjunctiva/sclera: Conjunctivae normal.  Cardiovascular:     Rate and Rhythm: Regular rhythm. Tachycardia present.     Pulses: Normal pulses.     Comments: Mildly tachycardic Pulmonary:     Effort: Pulmonary effort is normal. No respiratory distress.     Breath sounds: Wheezing present.  No rales.     Comments: Tachypneic with respiratory rate in the mid 20s Able to speak clearly in full sentences Abdominal:     General: Bowel sounds are normal. There is no distension.     Palpations: Abdomen is soft.     Tenderness: There is no abdominal tenderness.  Musculoskeletal:        General: No swelling or tenderness.     Cervical back: Normal range of motion and neck supple.  Skin:    General: Skin is warm and dry.  Neurological:     General: No focal deficit present.     Mental Status: She is alert and oriented to person, place, and time.     Labs on Admission: I have personally reviewed following labs and imaging studies  CBC: Recent Labs  Lab 09/19/21 0032  WBC 15.0*  NEUTROABS 11.3*  HGB 10.5*  HCT 34.6*  MCV 86.3  PLT 409   Basic Metabolic Panel: Recent Labs  Lab 09/19/21 0032  NA 138  K 3.1*  CL 105  CO2 26  GLUCOSE 136*  BUN 10  CREATININE 0.72  CALCIUM 8.9   GFR: Estimated Creatinine Clearance: 115.8 mL/min (by C-G formula based on SCr of 0.72 mg/dL). Liver Function Tests: No results for input(s): AST, ALT, ALKPHOS, BILITOT, PROT, ALBUMIN in the last 168 hours. No  results for input(s): LIPASE, AMYLASE in the last 168 hours. No results for input(s): AMMONIA in the last 168 hours. Coagulation Profile: No results for input(s): INR, PROTIME in the last 168 hours. Cardiac Enzymes: No results for input(s): CKTOTAL, CKMB, CKMBINDEX, TROPONINI in the last 168 hours. BNP (last 3 results) No results for input(s): PROBNP in the last 8760 hours. HbA1C: No results for input(s): HGBA1C in the last 72 hours. CBG: No results for input(s): GLUCAP in the last 168 hours. Lipid Profile: No results for input(s): CHOL, HDL, LDLCALC, TRIG, CHOLHDL, LDLDIRECT in the last 72 hours. Thyroid Function Tests: No results for input(s): TSH, T4TOTAL, FREET4, T3FREE, THYROIDAB in the last 72 hours. Anemia Panel: No results for input(s): VITAMINB12, FOLATE, FERRITIN, TIBC, IRON, RETICCTPCT in the last 72 hours. Urine analysis:    Component Value Date/Time   COLORURINE YELLOW 03/31/2016 1515   APPEARANCEUR HAZY (A) 03/31/2016 1515   LABSPEC >1.030 (H) 03/31/2016 1515   PHURINE 6.0 03/31/2016 1515   GLUCOSEU NEGATIVE 03/31/2016 1515   HGBUR LARGE (A) 03/31/2016 1515   BILIRUBINUR small (A) 03/12/2019 1126   KETONESUR negative 03/12/2019 1126   KETONESUR NEGATIVE 03/31/2016 1515   PROTEINUR NEGATIVE 03/31/2016 1515   UROBILINOGEN 1.0 03/12/2019 1126   UROBILINOGEN 1.0 12/08/2015 1440   NITRITE Negative 03/12/2019 1126   NITRITE NEGATIVE 03/31/2016 1515   LEUKOCYTESUR Negative 03/12/2019 1126    Radiological Exams on Admission: DG Chest 2 View  Result Date: 09/19/2021 CLINICAL DATA:  Cough, shortness of breath EXAM: CHEST - 2 VIEW COMPARISON:  12/03/2019 FINDINGS: Lungs are clear.  No pleural effusion or pneumothorax. Cardiomegaly. Visualized osseous structures are within normal limits. IMPRESSION: Normal chest radiographs. Electronically Signed   By: Julian Hy M.D.   On: 09/19/2021 01:37    EKG: Independently reviewed.  Sinus rhythm, no acute ischemic  changes.  Assessment/Plan Principal Problem:   Asthma exacerbation Active Problems:   Diabetes type 2, controlled ([place])   Essential hypertension   Hypokalemia   Acute asthma exacerbation Patient complaining of progressively worsening dyspnea, cough, wheezing, and chest tightness x4 days.  She was in respiratory distress on arrival  to the ED and was given Solu-Medrol 125 mg, IV magnesium 2 g, and DuoNeb x3.  Chest x-ray negative for acute finding.  COVID and flu negative.  EKG without acute ischemic changes.  Still wheezing but work of breathing has improved.  Not hypoxic. -IV Solu-Medrol 60 mg every 6 hours, DuoNeb every 6 hours, albuterol neb every 2 hours as needed, Pulmicort neb twice daily, Mucinex DM.  Continuous pulse ox, supplemental oxygen as needed to keep oxygen saturation above 92%.  Mild hypokalemia -Potassium supplement given  Leukocytosis Likely reactive.  Not febrile and chest x-ray not suggestive of pneumonia.  Not endorsing any other infectious symptoms. -Repeat CBC in a.m.  Non-insulin-dependent type 2 diabetes Appears to be well controlled - A1c 5.8 on 05/10/2021.  Metformin listed in home medications but patient tells me she is not taking it.  She is requesting regular diet.  -Sensitive sliding scale insulin ACHS.  Depression -Continue duloxetine  Hypertension Slightly hypertensive. -Continue lisinopril.  Hydralazine prn.   Morbid obesity (BMI 53.50) -Encourage lifestyle modifications such as healthy diet and exercise.  DVT prophylaxis: Lovenox Code Status: Full code Family Communication: No family at bedside. Disposition Plan: Status is: Observation  The patient remains OBS appropriate and will d/c before 2 midnights.  Level of care: Level of care: Telemetry Medical  The medical decision making on this patient was of high complexity and the patient is at high risk for clinical deterioration, therefore this is a level 3 visit.  Shela Leff  MD [facility]  If 7PM-7AM, please contact night-coverage [email or web address]  09/19/2021, 3:46 AM

## 2021-09-20 ENCOUNTER — Inpatient Hospital Stay (HOSPITAL_COMMUNITY): Payer: Self-pay

## 2021-09-20 DIAGNOSIS — E118 Type 2 diabetes mellitus with unspecified complications: Secondary | ICD-10-CM

## 2021-09-20 DIAGNOSIS — Z794 Long term (current) use of insulin: Secondary | ICD-10-CM

## 2021-09-20 DIAGNOSIS — E876 Hypokalemia: Secondary | ICD-10-CM

## 2021-09-20 DIAGNOSIS — J4531 Mild persistent asthma with (acute) exacerbation: Secondary | ICD-10-CM

## 2021-09-20 LAB — BASIC METABOLIC PANEL
Anion gap: 6 (ref 5–15)
BUN: 16 mg/dL (ref 6–20)
CO2: 28 mmol/L (ref 22–32)
Calcium: 9.2 mg/dL (ref 8.9–10.3)
Chloride: 104 mmol/L (ref 98–111)
Creatinine, Ser: 0.85 mg/dL (ref 0.44–1.00)
GFR, Estimated: >60 mL/min (ref >60)
Glucose, Bld: 143 mg/dL — ABNORMAL HIGH (ref 70–99)
Potassium: 4.9 mmol/L (ref 3.5–5.1)
Sodium: 138 mmol/L (ref 135–145)

## 2021-09-20 LAB — CBC WITH DIFFERENTIAL/PLATELET
Abs Immature Granulocytes: 0.17 10*3/uL — ABNORMAL HIGH (ref 0.00–0.07)
Basophils Absolute: 0 10*3/uL (ref 0.0–0.1)
Basophils Relative: 0 %
Eosinophils Absolute: 0 10*3/uL (ref 0.0–0.5)
Eosinophils Relative: 0 %
HCT: 36.1 % (ref 36.0–46.0)
Hemoglobin: 11.1 g/dL — ABNORMAL LOW (ref 12.0–15.0)
Immature Granulocytes: 1 %
Lymphocytes Relative: 8 %
Lymphs Abs: 1.8 10*3/uL (ref 0.7–4.0)
MCH: 26.2 pg (ref 26.0–34.0)
MCHC: 30.7 g/dL (ref 30.0–36.0)
MCV: 85.3 fL (ref 80.0–100.0)
Monocytes Absolute: 0.7 10*3/uL (ref 0.1–1.0)
Monocytes Relative: 3 %
Neutro Abs: 18.3 10*3/uL — ABNORMAL HIGH (ref 1.7–7.7)
Neutrophils Relative %: 88 %
Platelets: 439 10*3/uL — ABNORMAL HIGH (ref 150–400)
RBC: 4.23 MIL/uL (ref 3.87–5.11)
RDW: 15.7 % — ABNORMAL HIGH (ref 11.5–15.5)
WBC: 20.9 10*3/uL — ABNORMAL HIGH (ref 4.0–10.5)
nRBC: 0 % (ref 0.0–0.2)

## 2021-09-20 LAB — RESPIRATORY PANEL BY PCR

## 2021-09-20 LAB — GLUCOSE, CAPILLARY
Glucose-Capillary: 214 mg/dL — ABNORMAL HIGH (ref 70–99)
Glucose-Capillary: 106 mg/dL — ABNORMAL HIGH (ref 70–99)
Glucose-Capillary: 107 mg/dL — ABNORMAL HIGH (ref 70–99)
Glucose-Capillary: 130 mg/dL — ABNORMAL HIGH (ref 70–99)

## 2021-09-20 MED ORDER — SALINE SPRAY 0.65 % NA SOLN
1.0000 | NASAL | Status: DC | PRN
  Filled 2021-09-20: qty 44

## 2021-09-20 MED ORDER — KETOROLAC TROMETHAMINE 15 MG/ML IJ SOLN
30.0000 mg | Freq: Four times a day (QID) | INTRAMUSCULAR | Status: AC
  Administered 2021-09-20 – 2021-09-24 (×13): 30 mg via INTRAVENOUS
  Filled 2021-09-20 (×13): qty 2

## 2021-09-20 MED ORDER — ALBUTEROL SULFATE (2.5 MG/3ML) 0.083% IN NEBU
2.5000 mg | INHALATION_SOLUTION | RESPIRATORY_TRACT | Status: DC
  Administered 2021-09-20 – 2021-09-26 (×34): 2.5 mg via RESPIRATORY_TRACT
  Filled 2021-09-20 (×34): qty 3

## 2021-09-20 MED ORDER — METHYLPREDNISOLONE SODIUM SUCC 125 MG IJ SOLR: 125.0000 mg | Freq: Every day | INTRAMUSCULAR | Status: DC

## 2021-09-20 MED ORDER — DM-GUAIFENESIN ER 30-600 MG PO TB12
2.0000 | ORAL_TABLET | Freq: Two times a day (BID) | ORAL | Status: DC
  Administered 2021-09-20 – 2021-09-26 (×12): 2 via ORAL
  Filled 2021-09-20 (×3): qty 2
  Filled 2021-09-20: qty 1
  Filled 2021-09-20 (×3): qty 2
  Filled 2021-09-20 (×2): qty 1
  Filled 2021-09-20 (×4): qty 2

## 2021-09-20 MED ORDER — DOCUSATE SODIUM 50 MG PO CAPS
50.0000 mg | ORAL_CAPSULE | Freq: Once | ORAL | Status: AC
  Administered 2021-09-20: 20:00:00 50 mg via ORAL
  Filled 2021-09-20: qty 1

## 2021-09-20 MED ORDER — FLUTICASONE PROPIONATE 50 MCG/ACT NA SUSP
2.0000 | Freq: Every day | NASAL | Status: DC
  Administered 2021-09-20 – 2021-09-26 (×7): 2 via NASAL
  Filled 2021-09-20: qty 16

## 2021-09-20 MED ORDER — REVEFENACIN 175 MCG/3ML IN SOLN
175.0000 ug | Freq: Every day | RESPIRATORY_TRACT | Status: DC
  Filled 2021-09-20: qty 3

## 2021-09-20 MED ORDER — BUDESONIDE 0.5 MG/2ML IN SUSP
0.5000 mg | Freq: Two times a day (BID) | RESPIRATORY_TRACT | Status: DC
  Administered 2021-09-20 – 2021-09-26 (×12): 0.5 mg via RESPIRATORY_TRACT
  Filled 2021-09-20 (×12): qty 2

## 2021-09-20 MED ORDER — AZITHROMYCIN 250 MG PO TABS
500.0000 mg | ORAL_TABLET | Freq: Every day | ORAL | Status: AC
  Administered 2021-09-20: 18:00:00 500 mg via ORAL
  Filled 2021-09-20: qty 2

## 2021-09-20 MED ORDER — ARFORMOTEROL TARTRATE 15 MCG/2ML IN NEBU
15.0000 ug | INHALATION_SOLUTION | Freq: Two times a day (BID) | RESPIRATORY_TRACT | Status: DC
  Administered 2021-09-20 – 2021-09-26 (×12): 15 ug via RESPIRATORY_TRACT
  Filled 2021-09-20 (×12): qty 2

## 2021-09-20 MED ORDER — ALBUTEROL SULFATE (2.5 MG/3ML) 0.083% IN NEBU: 2.5000 mg | INHALATION_SOLUTION | RESPIRATORY_TRACT | Status: DC

## 2021-09-20 MED ORDER — HYDROCODONE-ACETAMINOPHEN 5-325 MG PO TABS
1.0000 | ORAL_TABLET | Freq: Four times a day (QID) | ORAL | Status: DC | PRN
  Administered 2021-09-20 – 2021-09-26 (×11): 1 via ORAL
  Filled 2021-09-20 (×12): qty 1

## 2021-09-20 MED ORDER — AZITHROMYCIN 250 MG PO TABS
250.0000 mg | ORAL_TABLET | Freq: Every day | ORAL | Status: AC
  Administered 2021-09-21 – 2021-09-24 (×4): 250 mg via ORAL
  Filled 2021-09-20 (×4): qty 1

## 2021-09-20 MED ORDER — IPRATROPIUM-ALBUTEROL 0.5-2.5 (3) MG/3ML IN SOLN: 3.0000 mL | Freq: Four times a day (QID) | RESPIRATORY_TRACT | Status: DC | PRN

## 2021-09-20 MED ORDER — PREDNISONE 50 MG PO TABS: 60.0000 mg | ORAL_TABLET | Freq: Two times a day (BID) | ORAL | Status: DC

## 2021-09-20 MED ORDER — MONTELUKAST SODIUM 5 MG PO CHEW
10.0000 mg | CHEWABLE_TABLET | Freq: Every day | ORAL | Status: DC
  Administered 2021-09-20 – 2021-09-25 (×6): 10 mg via ORAL
  Filled 2021-09-20 (×7): qty 2

## 2021-09-20 NOTE — Progress Notes (Signed)
PROGRESS NOTE  Hannah Baldwin SWF:[id number] DOB: Dec 04, 1970 DOA: 09/18/2021 PCP: Ladell Pier, MD  Brief History   Hannah Baldwin is a 50 y.o. female with medical history significant of asthma, non-insulin-dependent type II diabetes, depression, morbid obesity presenting to the ED complaining of cough, shortness of breath, and wheezing.  On arrival to the ED, she was in respiratory distress.  Wheezing and tachypneic.  Not hypoxic.  She was treated with Solu-Medrol 125 mg, IV magnesium 2 g, and DuoNeb x3.  Not febrile.  Labs showing WBC 15.0.  Hemoglobin 10.5, no significant change from baseline.  Potassium 3.1.  COVID and flu negative.  Chest x-ray negative for acute finding.   Patient reports shortness of breath, cough, wheezing, and chest tightness.  Symptoms started 4 days ago and getting progressively worse.  She is using all of her home inhalers and also her nebulizer but not improving.  States she feels very short of breath when walking and even at rest when talking.  She stopped smoking cigarettes 6 months ago.  The patient is complaining of pleuritic chest pain and hypoxia into the 70's with any attempt at activity. She is saturating in the 90's on room air at rest. She is exhibiting 3 word dyspnea with conversation.  Consultants  None  Procedures  None  Antibiotics   Anti-infectives (From admission, onward)    Start     Dose/Rate Route Frequency Ordered Stop   09/21/21 1000  azithromycin (ZITHROMAX) tablet 250 mg       See Hyperspace for full Linked Orders Report.   250 mg Oral Daily 09/20/21 1603 09/25/21 0959   09/20/21 1700  azithromycin (ZITHROMAX) tablet 500 mg       See Hyperspace for full Linked Orders Report.   500 mg Oral Daily 09/20/21 1603 09/20/21 1800       Subjective  Although she states that she is feeling better than when she arrived, the patient continues to have severe dyspnea and cough with conversation ans sever hypoxia with any activity.    Objective   Vitals:  Vitals:   09/20/21 1511 09/20/21 1542  BP:  (!) 149/84  Pulse:  85  Resp:  19  Temp:  97.8 F (36.6 C)  SpO2: 97% 92%    Exam:  Exam:  Constitutional:  The patient is awake, alert, and oriented x 3. Moderate distress from dyspnea. Respiratory:  No increased work of breathing. No wheezes, rales, or rhonchi No tactile fremitus Cardiovascular:  Regular rate and rhythm No murmurs, ectopy, or gallups. No lateral PMI. No thrills. Abdomen:  Abdomen is soft, non-tender, non-distended No hernias, masses, or organomegaly Normoactive bowel sounds.  Musculoskeletal:  No cyanosis, clubbing, or edema Skin:  No rashes, lesions, ulcers palpation of skin: no induration or nodules Neurologic:  CN 2-12 intact Sensation all 4 extremities intact Psychiatric:  Mental status Mood, affect appropriate Orientation to person, place, time  judgment and insight appear intact   I have personally reviewed the following:   Today's Data  vitals  Lab Data  CBC, BMP  Micro Data    Imaging    Cardiology Data    Other Data    Scheduled Meds:  albuterol  2.5 mg Nebulization Q4H   arformoterol  15 mcg Nebulization BID   [START ON 09/21/2021] azithromycin  250 mg Oral Daily   budesonide (PULMICORT) nebulizer solution  0.5 mg Nebulization BID   dextromethorphan-guaiFENesin  2 tablet Oral BID   DULoxetine  60 mg Oral  Daily   enoxaparin (LOVENOX) injection  65 mg Subcutaneous Q24H   fluticasone  2 spray Each Nare Daily   hydrochlorothiazide  25 mg Oral Daily   insulin aspart  0-5 Units Subcutaneous QHS   insulin aspart  0-9 Units Subcutaneous TID WC   ketorolac  30 mg Intravenous Q6H   lisinopril  10 mg Oral Daily   megestrol  40 mg Oral BID   metFORMIN  500 mg Oral BID WC   [START ON 09/21/2021] methylPREDNISolone (SOLU-MEDROL) injection  125 mg Intravenous Daily   montelukast  10 mg Oral QHS   [START ON 09/21/2021] revefenacin  175 mcg Nebulization  Daily    Principal Problem:   Asthma exacerbation Active Problems:   Diabetes type 2, controlled ([place])   Essential hypertension   Hypokalemia   LOS: 1 day   A & P  Acute asthma exacerbation Patient complaining of progressively worsening dyspnea, cough, wheezing, and chest tightness x4 days.  She was in respiratory distress on arrival to the ED and was given Solu-Medrol 125 mg, IV magnesium 2 g, and DuoNeb x3.  Chest x-ray negative for acute finding.  COVID and flu negative.  EKG without acute ischemic changes.  Still wheezing but work of breathing has improved.   -She is currently requiring 5L O2 by nasal cannula to maintain saturations of 98%.  -IV Solu-Medrol 125 daily, DuoNeb every 6 hours, albuterol neb every 2 hours as needed, Pulmicort neb twice daily, Mucinex DM.  Continuous pulse ox, supplemental oxygen as needed to keep oxygen saturation above 92%. PCCM consulted. I appreciate their assistance.   Mild hypokalemia -Supplement and monitor.   Leukocytosis Likely reactive and in response to steroids.  Not febrile and chest x-ray not suggestive of pneumonia.  Not endorsing any other infectious symptoms. -Repeat CBC in a.m.   Non-insulin-dependent type 2 diabetes Appears to be well controlled - A1c 6.1  on 09/18/2021.  Metformin listed in home medications but patient tells me she is not taking it.  She is requesting regular diet.  -Sensitive sliding scale insulin ACHS   Depression -Continue duloxetine   Hypertension Pt with high BP. Even when systolic is relatively controlled the diastolic is in the 882'C. Will add hydrochlorothiazide. Suspect metabolic syndrome in this morbidly obese woman with DM and HTN on Megace for DUB. -Continue lisinopril.  Add HCTZ.   Morbid obesity (BMI 53.50) -Encourage lifestyle modifications such as healthy diet and exercise. Use of megace for DUB in this morbidly obese woman with hypertension and DM II is not helpful here. Understand that it is  important to control of DUB without a procedure such as endometrial ablation, but it should be discontinued as soon as another modality of controlling DUB is possible.  DUB:  Patient has been on Megace for 2 years for this indication. While it is effective in controlling her debilitating DUB, it is contributing to the patient's severe morbid obesity, hypertension, and DM II - metabolic syndrome. She is working on getting Medicaid coverage. Hopefully this will put a procedure such as endometrial ablation within her grasp financially so Megace can be stopped. Will check lipid panel.  I have seen and examined this patient myself. I have spent 32 minutes in her evaluation and care.   DVT prophylaxis: Lovenox Code Status: Full code Family Communication: No family at bedside. Disposition Plan: Status is: Inpatient. Disposition will be to home.   Level of care: Level of care: Telemetry Medical   The medical decision making on  this patient was of high complexity and the patient is at high risk for clinical deterioration, therefore this is a level 3 visit.   Deshawn Skelley, DO [facility]   If 7PM-7AM, please contact night-coverage [email or web address]  Kiara Keep, DO [facility] Direct contact: see [email or web address]  7PM-7AM contact night coverage as above 09/20/2021, 7:04:42 PM  LOS: 0 days

## 2021-09-20 NOTE — Progress Notes (Signed)
Mobility Specialist Progress Note:   09/20/21 1640  Mobility  Activity Ambulated in hall  Level of Assistance Standby assist, set-up cues, supervision of patient - no hands on  Assistive Device None  Distance Ambulated (ft) 120 ft  Mobility Ambulated independently in hallway  Mobility Response Tolerated fair  Mobility performed by Mobility specialist  $Mobility charge 1 Mobility   Pre Mobility: SpO2 90% During Mobility: SpO2 95% Post Mobility: SpO2 93%  Pt displayed SOB and wheezing during ambulation on 3LO2. SpO2 did not drop below 90% throughout session. Pt back sitting EOB with all needs met.   Nelta Numbers Mobility Specialist  Phone [phone]

## 2021-09-20 NOTE — Progress Notes (Signed)
Mobility Specialist Progress Note:   09/20/21 1200  Mobility  Activity Ambulated in hall  Level of Assistance Independent  Assistive Device None  Distance Ambulated (ft) 120 ft  Mobility Ambulated independently in hallway  Mobility Response Tolerated fair  Mobility performed by Mobility specialist  $Mobility charge 1 Mobility   Pt with wheezing/SOB during ambulation on 3LO2. SpO2 dropped to 85% during ambulation, requiring a seated rest break. Pt quickly recovered to >90% with pursed lip breathing. Back sitting EOB with all needs met.   Nelta Numbers Mobility Specialist  Phone [phone]

## 2021-09-20 NOTE — Consult Note (Signed)
NAME:  Hannah Baldwin, MRN:  [id number], DOB:  1971/03/16, LOS: 1 ADMISSION DATE:  09/18/2021, CONSULTATION DATE:  09/20/2021 REFERRING MD:  Dr. Benny Lennert, CHIEF COMPLAINT:  SOB/ wheezing   History of Present Illness:  50 year old female with prior history of former smoker, asthma, DMT2, hypertension, depression, dysfunctional uterine bleeding, and morbid obesity who presented on 12/12 with complaints of 4 days of shortness of breath, wheezing, and chest tightness.    Former cigarette smoker.  Started smoking around 50 yo and quit in 04/2021.  At her heaviest, she would smoke 1 to 2 ppd.  Formerly snorted "white stuff" and but denies any recent use of cocaine.  States she was diagnosed with asthma in her 24's and has required hospitalization once several years ago but never required intubation.  Can not recall any recent sick contacts other than friends with minor sniffles and occasional cough.  No recent travel, hemoptysis, weight loss, lower leg swelling, or calf pain.  Reports she is allergic to cats and in the past gets SOB and wheezes.  Notes there are cats where she takes out her trash currently.  Denies any GERD symptoms.  Reports seasonal allergies and current  sinus tenderness and throat drainage.  Has been coughing, mostly non productive because its thick and she cant get it up, but when she can get it up, reports its green.  Denies any vomiting.  Reports chest pain, pleuritic in nature.   Prior to getting sick, she does not use anything for her asthma on daily basis, just inhalers as needed.  Never seen a pulmonologist, and currently gets [facility] care [facility].  She currently does not work waiting on disability and medicaid.    On workup, she has been afebrile and hemodynamically stable.  Some reports of hypoxia into the 70's but documentation shows normal saturations > 96% initially but since on 2-3 L O2 for saturations of 92% on room air.  She also reports great relief  with supplemental O2.  Labs noted for leukocytosis, negative COVID/ flu, EKG non acute, and CXR negative for acute process.  She was admitted to [facility] and started on nebs, steroids, O2 as needed.  Pulmonary consulted on 12/14 due to minimal improvement.    Pertinent  Medical History  former smoker (quit 04/2021), asthma, DMT2, hypertension, depression, dysfunctional uterine bleeding, and morbid obesity   Significant Hospital Events: Including procedures, antibiotic start and stop dates in addition to other pertinent events   12/13 admitted to [facility] 12/14 pulmonary consulted   Interim History / Subjective:  Currently on 3L [place]  Objective   Blood pressure (!) 173/96, pulse 98, temperature 97.7 F (36.5 C), temperature source Oral, resp. rate 20, height 5' 3" (1.6 m), weight (!) 137 kg, SpO2 97 %.        Intake/Output Summary (Last 24 hours) at 09/20/2021 1512 Last data filed at 09/20/2021 0815 Gross per 24 hour  Intake 1000 ml  Output --  Net 1000 ml   Filed Weights   09/18/21 2354  Weight: (!) 137 kg   Examination: General:  Pleasant morbidly obese female sitting on side of bed eating in NAD HEENT: MM pink/moist, some B maxillary sinus tenderness, no inspiratory stridor, some transmitted lower sounds Neuro:  Aox3, MAE CV: rr PULM:  non labored initially but with speaking full sentences but becomes visibly dyspneic and audible and diffuse prolonged expiratory wheeze throughout lung fields.  Currently she is 94% while speaking  full sentences on 3L [place] GI: soft, bs+  Extremities: warm/dry, no LE swelling, erythema, or tenderness Skin: no rashes   Resolved Hospital Problem list    Assessment & Plan:   Acute asthma exacerbation  Hypoxia  Former smoker  - continue supplemental O2 for sats < 92%, adding humidity given nasal dryness - continue oral prednisone 51m BID - continue pulmicort (dose adjusted), switching to albuterol q 4 hr scheduled for now and duonebs prn.   Adding  brovanna and yupelri.  Stopping dulera for now  - check respiratory viral panel - leukocytosis likely related to steroids.  CXR remains clear, but given smoking hx and reported change in sputum amount and color, will treat with azithromax empirically for 5 days - aggressive pulmonary hygiene -> IS, flutter, mobilize as able  - adding flonase, singular, and nasal spray - continue guaifenesin and tussionex  - doubt/ low suspicion for PE.  Doubt VCD.   - consider trial of PPI although denies any symptoms of GERD - needs outpt PFTs and possibly sleep study based on body habitus.  She knows she snores but never been told she has apnea.  No prior.  Ideally should follow up in pulmonary office but currently awaiting medicaid and would be financially taxing on patient.  Can follow up when she gets insurance.  - noted in 2018 to have high eosinophils.  Would benefit from outpt testing for eosinophilic asthma when off steroids.     Remainder per primary team.  Pulmonary will continue to follow.    HTN HLD DMT2 Morbid obesity Dysfunctional uterine bleeding   Labs   CBC: Recent Labs  Lab 09/19/21 0032 09/19/21 0417 09/20/21 0411  WBC 15.0* 18.2* 20.9*  NEUTROABS 11.3*  --  18.3*  HGB 10.5* 11.7* 11.1*  HCT 34.6* 39.1 36.1  MCV 86.3 87.3 85.3  PLT 385 374 439*    Basic Metabolic Panel: Recent Labs  Lab 09/19/21 0032 09/20/21 0411  NA 138 138  K 3.1* 4.9  CL 105 104  CO2 26 28  GLUCOSE 136* 143*  BUN 10 16  CREATININE 0.72 0.85  CALCIUM 8.9 9.2   GFR: Estimated Creatinine Clearance: 108.9 mL/min (by C-G formula based on SCr of 0.85 mg/dL). Recent Labs  Lab 09/19/21 0032 09/19/21 0417 09/20/21 0411  WBC 15.0* 18.2* 20.9*    Liver Function Tests: No results for input(s): AST, ALT, ALKPHOS, BILITOT, PROT, ALBUMIN in the last 168 hours. No results for input(s): LIPASE, AMYLASE in the last 168 hours. No results for input(s): AMMONIA in the last 168 hours.  ABG     Component Value Date/Time   HCO3 24.5 (H) 12/03/2007 1117   TCO2 28 12/31/2012 1515   ACIDBASEDEF 1.0 12/03/2007 1117     Coagulation Profile: No results for input(s): INR, PROTIME in the last 168 hours.  Cardiac Enzymes: No results for input(s): CKTOTAL, CKMB, CKMBINDEX, TROPONINI in the last 168 hours.  HbA1C: HbA1c, POC (prediabetic range)  Date/Time Value Ref Range Status  08/04/2018 02:37 PM 5.7 5.7 - 6.4 % Final   HbA1c, POC (controlled diabetic range)  Date/Time Value Ref Range Status  05/10/2021 02:03 PM 5.8 0.0 - 7.0 % Final   HbA1c POC (<> result, manual entry)  Date/Time Value Ref Range Status  02/26/2020 03:19 PM 5.4 4.0 - 5.6 % Final   Hgb A1c MFr Bld  Date/Time Value Ref Range Status  09/19/2021 04:19 AM 6.1 (H) 4.8 - 5.6 % Final    Comment:    (  NOTE) Pre diabetes:          5.7%-6.4%  Diabetes:              >6.4%  Glycemic control for   <7.0% adults with diabetes   09/25/2019 11:36 AM 5.9 (H) 4.8 - 5.6 % Final    Comment:             Prediabetes: 5.7 - 6.4          Diabetes: >6.4          Glycemic control for adults with diabetes: <7.0     CBG: Recent Labs  Lab 09/19/21 1356 09/19/21 1618 09/19/21 2050 09/20/21 0822 09/20/21 1128  GLUCAP 122* 155* 172* 214* 106*    Review of Systems:   Review of Systems  Constitutional:  Negative for chills, fever and weight loss.  HENT:  Positive for congestion and sinus pain. Negative for sore throat.   Respiratory:  Positive for cough, sputum production, shortness of breath and wheezing. Negative for hemoptysis.   Cardiovascular:  Positive for chest pain. Negative for orthopnea, leg swelling and PND.  Gastrointestinal:  Negative for abdominal pain, nausea and vomiting.  Skin:  Negative for itching and rash.  Neurological:  Negative for focal weakness and loss of consciousness.  Endo/Heme/Allergies:  Positive for environmental allergies.   Past Medical History:  She,  has a past medical history of  Anemia, Asthma (Dx 2014), Diabetes mellitus without complication ([place]), Legally blind in left eye, as defined in [place], and Pneumonia (~ 2000 X 1).   Surgical History:   Past Surgical History:  Procedure Laterality Date   CESAREAN SECTION  1992     Social History:   reports that she quit smoking about 6 years ago. Her smoking use included cigarettes. She has a 5.00 pack-year smoking history. She has never used smokeless tobacco. She reports current alcohol use. She reports that she does not use drugs.   Family History:  Her family history includes Asthma in her daughter and mother.   Allergies No Known Allergies   Home Medications  Prior to Admission medications   Medication Sig Start Date End Date Taking? Authorizing Provider  acetaminophen (TYLENOL) 500 MG tablet Take 1,000 mg by mouth every 6 (six) hours as needed for moderate pain or headache.   Yes [provider]  albuterol (PROVENTIL) (2.5 MG/3ML) 0.083% nebulizer solution Take 3 mLs (2.5 mg total) by nebulization every 6 (six) hours as needed for wheezing or shortness of breath. 05/10/21  Yes Argentina Donovan, PA-C  albuterol (VENTOLIN HFA) 108 (90 Base) MCG/ACT inhaler Inhale 2 puffs into the lungs every 6 (six) hours as needed for wheezing or shortness of breath. 05/10/21  Yes Freeman Caldron M, PA-C  blood glucose meter kit and supplies KIT Dispense based on patient and insurance preference. Use up to four times daily as directed. (FOR ICD-9 250.00, 250.01). 01/25/15  Yes Charlott Rakes, MD  Blood Glucose Monitoring Suppl (TRUE METRIX METER) w/Device KIT USE AS INSTRUCTED 09/24/17  Yes Ladell Pier, MD  DULoxetine (CYMBALTA) 60 MG capsule Take 1 capsule (60 mg total) by mouth daily. 05/10/21  Yes Freeman Caldron M, PA-C  fluconazole (DIFLUCAN) 150 MG tablet Take one tablet by mouth once monthly Patient taking differently: Take 150 mg by mouth See admin instructions. Once a month as needed 08/11/21  Yes Ladell Pier, MD  glucose blood (TRUE METRIX BLOOD GLUCOSE TEST) test strip Use as instructed 09/24/17  Yes Karle Plumber  B, MD  HYDROcodone-acetaminophen (NORCO/VICODIN) 5-325 MG tablet Take 1 tablet by mouth every 8 (eight) hours as needed for moderate pain. 04/10/21  Yes Rosemarie Ax, MD  lidocaine (LIDODERM) 5 % Place 1 patch onto the skin daily as needed. Apply patch to area most significant pain once per day.  Remove and discard patch within 12 hours of application. Patient taking differently: Place 1 patch onto the skin daily as needed (pain). 03/19/21  Yes Petrucelli, Samantha R, PA-C  lisinopril (ZESTRIL) 10 MG tablet Take 1 tablet (10 mg total) by mouth daily. 05/10/21  Yes McClung, Angela M, PA-C  megestrol (MEGACE) 40 MG tablet TAKE 1 TABLET (40 MG TOTAL) BY MOUTH 2 (TWO) TIMES DAILY. Patient taking differently: Take 40 mg by mouth 2 (two) times daily. 03/02/21 03/02/22 Yes Ladell Pier, MD  meloxicam (MOBIC) 15 MG tablet TAKE 1 TABLET (15 MG TOTAL) BY MOUTH DAILY. Patient taking differently: Take 15 mg by mouth daily. 05/10/21 05/10/22 Yes McClung, Dionne Bucy, PA-C  metFORMIN (GLUCOPHAGE) 500 MG tablet Take 1 tablet (500 mg total) by mouth 2 (two) times daily. 05/10/21  Yes Argentina Donovan, PA-C  methocarbamol (ROBAXIN) 500 MG tablet Take 2 tablets (1,000 mg total) by mouth every 8 (eight) hours as needed for muscle spasms. 05/10/21  Yes McClung, Angela M, PA-C  mometasone-formoterol (DULERA) 100-5 MCG/ACT AERO Inhale 2 puffs into the lungs 2 (two) times daily. 05/10/21  Yes McClung, Angela M, PA-C  simethicone (MYLICON) 80 MG chewable tablet Chew 80-160 mg by mouth every 6 (six) hours as needed for flatulence.   Yes [provider]  traZODone (DESYREL) 100 MG tablet Take 1 tablet (100 mg total) by mouth at bedtime as needed for sleep. Patient taking differently: Take 100 mg by mouth at bedtime. 05/10/21 05/10/22 Yes McClung, Dionne Bucy, PA-C  TRUEPLUS LANCETS 28G MISC Check blood sugar TID & QHS  09/24/17  Yes Ladell Pier, MD  carbamide peroxide (DEBROX) 6.5 % OTIC solution Place 5 drops into the right ear 2 (two) times daily. Patient not taking: Reported on 09/19/2021 04/04/20   Camillia Herter, NP  lidocaine (LIDODERM) 5 % place 1 patch onto the skin daily as needed. (remove and  discard patch within 12 hours of application) 12/13/44   McClung, Dionne Bucy, PA-C  nystatin-triamcinolone (MYCOLOG II) cream Apply 1 application topically 2 (two) times daily. Patient not taking: Reported on 09/19/2021 08/11/21   Ladell Pier, MD  Vitamin D, Ergocalciferol, (DRISDOL) 1.25 MG (50000 UT) CAPS capsule Take 1 capsule (50,000 Units total) by mouth every 7 (seven) days. Patient not taking: Reported on 09/19/2021 09/29/19   Argentina Donovan, PA-C     Critical care time: n/a     Kennieth Rad, ACNP [facility] 09/20/2021, 4:53 PM  See Amion for pager If no response to pager, please call PCCM consult pager After 7:00 pm call Elink

## 2021-09-20 NOTE — TOC Initial Note (Addendum)
Transition of Care [facility]) - Initial/Assessment Note    Patient Details  Name: Hannah Baldwin MRN: [id number] Date of Birth: [birth year]  [facility] CM/SW Contact:    Marilu Favre, RN Phone Number: 09/20/2021, 2:21 PM  Clinical Narrative:                  Spoke to patient at bedside. Confirmed face sheet information.   Patient active with Dr Wynetta Emery at [facility]. Follow up scheduled   Patient has spoken with financial counselor at [facility] and states she has started [facility] application and disability application.   Will assist patient with discharge medications through [place] and [facility] program.   Patient requiring oxygen, does not have oxygen at home.   Patient lives with significant other.   NCM will continue to follow.    Expected Discharge Plan: Home/Self Care     Patient Goals and CMS Choice Patient states their goals for this hospitalization and ongoing recovery are:: to return to home CMS Medicare.gov Compare Post Acute Care list provided to:: Patient    Expected Discharge Plan and Services Expected Discharge Plan: Home/Self Care   Discharge Planning Services: CM Consult   Living arrangements for the past 2 months: Single Family Home                                      Prior Living Arrangements/Services Living arrangements for the past 2 months: Single Family Home Lives with:: Significant Other Patient language and need for interpreter reviewed:: Yes Do you feel safe going back to the place where you live?: Yes      Need for Family Participation in Patient Care: Yes (Comment) Care giver support system in place?: Yes (comment)   Criminal Activity/Legal Involvement Pertinent to Current Situation/Hospitalization: No - Comment as needed  Activities of Daily Living      Permission Sought/Granted   Permission granted to share information with : No              Emotional Assessment Appearance::  Appears stated age Attitude/Demeanor/Rapport: Engaged Affect (typically observed): Accepting Orientation: : Oriented to Situation, Oriented to  Time, Oriented to Place, Oriented to Self Alcohol / Substance Use: Not Applicable Psych Involvement: No (comment)  Admission diagnosis:  Asthma exacerbation [J45.901] Moderate persistent asthma with acute exacerbation [J45.41] Patient Active Problem List   Diagnosis Date Noted   Asthma exacerbation 09/19/2021   Hypokalemia 09/19/2021   Essential hypertension 02/26/2020   Muscle cramp 02/26/2020   Fibroid uterus 01/09/2018   Primary osteoarthritis of left knee 04/09/2017   Insomnia 11/03/2015   Onychomycosis of toenail 07/25/2015   Diabetic neuropathy ([place]) 07/25/2015   DUB (dysfunctional uterine bleeding) 02/03/2015   Diabetes type 2, controlled ([place]) 01/25/2015   Severe obesity (BMI >= 40) ([place]) 01/25/2015   Moderate persistent asthma 01/21/2015   Cardiomegaly 06/09/2014   Hypochromic microcytic anemia 06/08/2014   Former smoker 01/01/2013   PCP:  Ladell Pier, MD Pharmacy:   [facility] [place] [address] [place] [address] Phone: [phone] Fax: [phone]  [place]), [place] - [address] [place] ([place]) [place] [address] Phone: [phone] Fax: [place] [address] [place] [address] Phone: [phone] Fax: [phone]     Social Determinants of Health (SDOH) Interventions  Readmission Risk Interventions No flowsheet data found.

## 2021-09-21 ENCOUNTER — Telehealth: Payer: Self-pay

## 2021-09-21 DIAGNOSIS — R058 Other specified cough: Secondary | ICD-10-CM

## 2021-09-21 DIAGNOSIS — K219 Gastro-esophageal reflux disease without esophagitis: Secondary | ICD-10-CM

## 2021-09-21 DIAGNOSIS — I10 Essential (primary) hypertension: Secondary | ICD-10-CM

## 2021-09-21 LAB — COMPREHENSIVE METABOLIC PANEL
ALT: 31 U/L (ref 0–44)
AST: 54 U/L — ABNORMAL HIGH (ref 15–41)
Albumin: 3 g/dL — ABNORMAL LOW (ref 3.5–5.0)
Alkaline Phosphatase: 92 U/L (ref 38–126)
Anion gap: 9 (ref 5–15)
BUN: 26 mg/dL — ABNORMAL HIGH (ref 6–20)
CO2: 25 mmol/L (ref 22–32)
Calcium: 9.2 mg/dL (ref 8.9–10.3)
Chloride: 103 mmol/L (ref 98–111)
Creatinine, Ser: 1.29 mg/dL — ABNORMAL HIGH (ref 0.44–1.00)
GFR, Estimated: 51 mL/min — ABNORMAL LOW (ref >60)
Glucose, Bld: 159 mg/dL — ABNORMAL HIGH (ref 70–99)
Potassium: 4.4 mmol/L (ref 3.5–5.1)
Sodium: 137 mmol/L (ref 135–145)
Total Bilirubin: 0.1 mg/dL — ABNORMAL LOW (ref 0.3–1.2)
Total Protein: 6.8 g/dL (ref 6.5–8.1)

## 2021-09-21 LAB — CBC WITH DIFFERENTIAL/PLATELET
Abs Immature Granulocytes: 0.28 10*3/uL — ABNORMAL HIGH (ref 0.00–0.07)
Basophils Absolute: 0.1 10*3/uL (ref 0.0–0.1)
Basophils Relative: 0 %
Eosinophils Absolute: 0 10*3/uL (ref 0.0–0.5)
Eosinophils Relative: 0 %
HCT: 37.9 % (ref 36.0–46.0)
Hemoglobin: 11.5 g/dL — ABNORMAL LOW (ref 12.0–15.0)
Immature Granulocytes: 1 %
Lymphocytes Relative: 13 %
Lymphs Abs: 2.9 10*3/uL (ref 0.7–4.0)
MCH: 25.9 pg — ABNORMAL LOW (ref 26.0–34.0)
MCHC: 30.3 g/dL (ref 30.0–36.0)
MCV: 85.4 fL (ref 80.0–100.0)
Monocytes Absolute: 1.4 10*3/uL — ABNORMAL HIGH (ref 0.1–1.0)
Monocytes Relative: 6 %
Neutro Abs: 18.4 10*3/uL — ABNORMAL HIGH (ref 1.7–7.7)
Neutrophils Relative %: 80 %
Platelets: 489 10*3/uL — ABNORMAL HIGH (ref 150–400)
RBC: 4.44 MIL/uL (ref 3.87–5.11)
RDW: 15.6 % — ABNORMAL HIGH (ref 11.5–15.5)
WBC: 23.1 10*3/uL — ABNORMAL HIGH (ref 4.0–10.5)
nRBC: 0 % (ref 0.0–0.2)

## 2021-09-21 LAB — GLUCOSE, CAPILLARY
Glucose-Capillary: 143 mg/dL — ABNORMAL HIGH (ref 70–99)
Glucose-Capillary: 104 mg/dL — ABNORMAL HIGH (ref 70–99)
Glucose-Capillary: 158 mg/dL — ABNORMAL HIGH (ref 70–99)
Glucose-Capillary: 122 mg/dL — ABNORMAL HIGH (ref 70–99)

## 2021-09-21 MED ORDER — LOSARTAN POTASSIUM 50 MG PO TABS
50.0000 mg | ORAL_TABLET | Freq: Every day | ORAL | Status: DC
  Administered 2021-09-21 – 2021-09-25 (×5): 50 mg via ORAL
  Filled 2021-09-21 (×5): qty 1

## 2021-09-21 MED ORDER — PANTOPRAZOLE SODIUM 40 MG PO TBEC
40.0000 mg | DELAYED_RELEASE_TABLET | Freq: Every day | ORAL | Status: DC
  Administered 2021-09-21 – 2021-09-26 (×6): 40 mg via ORAL
  Filled 2021-09-21 (×6): qty 1

## 2021-09-21 MED ORDER — PREDNISONE 20 MG PO TABS
20.0000 mg | ORAL_TABLET | Freq: Every day | ORAL | Status: DC
  Administered 2021-09-21 – 2021-09-22 (×2): 20 mg via ORAL
  Filled 2021-09-21 (×2): qty 1

## 2021-09-21 NOTE — Progress Notes (Signed)
Mobility Specialist Progress Note:   09/21/21 1030  Mobility  Activity Ambulated in hall  Level of Assistance Independent  Assistive Device None  Distance Ambulated (ft) 120 ft  Mobility Ambulated independently in hallway  Mobility Response Tolerated fair  Mobility performed by Mobility specialist  $Mobility charge 1 Mobility   Session performed on 4LO2 with SpO2 99% during ambulation. Pt required x1 seated rest break d/t SOB. Pt back sitting EOB with all needs met.   Nelta Numbers Mobility Specialist  Phone [phone]

## 2021-09-21 NOTE — Progress Notes (Signed)
Mobility Specialist Progress Note:   09/21/21 1030  Mobility  Activity Ambulated in hall  Level of Assistance Independent  Assistive Device None  Distance Ambulated (ft) 120 ft  Mobility Ambulated independently in hallway  Mobility Response Tolerated fair  Mobility performed by Mobility specialist  $Mobility charge 1 Mobility   Session performed on 4LO2 with SpO2 99% during ambulation. Pt required x1 seated rest break d/t SOB. Pt back sitting EOB with all needs met.   Nelta Numbers Mobility Specialist  Phone [phone]

## 2021-09-21 NOTE — Progress Notes (Addendum)
NAME:  Hannah Baldwin, MRN:  [id number], DOB:  Jun 04, 1971, LOS: 2 ADMISSION DATE:  09/18/2021, CONSULTATION DATE:  12/14  REFERRING MD:  Benny Lennert, CHIEF COMPLAINT:  Wheezing   History of Present Illness:  50 y/o smoker with a history of asthma admitted for an asthma exacerbation due to rhinovirus.  Pertinent  Medical History  Cigarette smoker Asthma Dm2 Depression Morbid obesity Significant Hospital Events: Including procedures, antibiotic start and stop dates in addition to other pertinent events   12/13 admitted to hospital  Interim History / Subjective:  Still wheezing some Feels better Says she is still smoking Says she has been on prednisone several times as an outpatient  Objective   Blood pressure 133/78, pulse 76, temperature 97.8 F (36.6 C), temperature source Oral, resp. rate 19, height 5\' 3"  (1.6 m), weight (!) 137 kg, SpO2 100 %.        Intake/Output Summary (Last 24 hours) at 09/21/2021 0819 Last data filed at 09/20/2021 1700 Gross per 24 hour  Intake 660 ml  Output --  Net 660 ml   Filed Weights   09/18/21 2354  Weight: (!) 137 kg    Examination:  General:  Morbidly obese, resting comfortably in bed; speaking loudly on phone before I entered, after conversation some loud upper airway wheezing developed but no dyspnea HENT: NCAT OP clear PULM: Loud upper airway wheeze B, normal effort CV: RRR, no mgr GI: BS+, soft, nontender MSK: normal bulk and tone Neuro: awake, alert, no distress, MAEW  Admission CBC Eos 100cell/dL   Resolved Hospital Problem list     Assessment & Plan:  Acute exacerbation of asthma due to rhinovirus infection Cigarette smoker Obesity Most likely has component of vocal cord dysfunction/irritation contibuting to her wheezing based on exam on 12/15 High likelihood of GERD  Discussion: Probably asthma flare up, but also with strong upper airway component.  Tells me she is still smoking [place].  Most of her wheezing is  upper airway in nature.    Plan: OK to use brovana/pulmicort in hospital D/c yupelri Dulera bid, continue this at discharge Albuterol prn Stop lisinopril given upper airway component Start losartan Start pantoprazole Would have her use tessalon as needed for cough at home Continue montelukast Taper off prednisone: could give prednisone 20mg  daily at this point, 5 days Needs outpatient f/u with pulmonary,  Needs PFT as outpatient Wean off O2, could probably just stop it Would have her walk around Could d/c home today or tomorrow Counsel to quit smoking cigarettes  PCCM available PRN   Best Practice (right click and "Reselect all SmartList Selections" daily)   Per TR[facility]  Labs   CBC: Recent Labs  Lab 09/19/21 0032 09/19/21 0417 09/20/21 0411 09/21/21 0332  WBC 15.0* 18.2* 20.9* 23.1*  NEUTROABS 11.3*  --  18.3* 18.4*  HGB 10.5* 11.7* 11.1* 11.5*  HCT 34.6* 39.1 36.1 37.9  MCV 86.3 87.3 85.3 85.4  PLT 385 374 439* 489*    Basic Metabolic Panel: Recent Labs  Lab 09/19/21 0032 09/20/21 0411 09/21/21 0332  NA 138 138 137  K 3.1* 4.9 4.4  CL 105 104 103  CO2 26 28 25   GLUCOSE 136* 143* 159*  BUN 10 16 26*  CREATININE 0.72 0.85 1.29*  CALCIUM 8.9 9.2 9.2   GFR: Estimated Creatinine Clearance: 71.8 mL/min (A) (by C-G formula based on SCr of 1.29 mg/dL (H)). Recent Labs  Lab 09/19/21 0032 09/19/21 0417 09/20/21 0411 09/21/21 0332  WBC 15.0* 18.2* 20.9* 23.1*  Liver Function Tests: Recent Labs  Lab 09/21/21 0332  AST 54*  ALT 31  ALKPHOS 92  BILITOT 0.1*  PROT 6.8  ALBUMIN 3.0*   No results for input(s): LIPASE, AMYLASE in the last 168 hours. No results for input(s): AMMONIA in the last 168 hours.  ABG    Component Value Date/Time   HCO3 24.5 (H) 12/03/2007 1117   TCO2 28 12/31/2012 1515   ACIDBASEDEF 1.0 12/03/2007 1117     Coagulation Profile: No results for input(s): INR, PROTIME in the last 168 hours.  Cardiac Enzymes: No results  for input(s): CKTOTAL, CKMB, CKMBINDEX, TROPONINI in the last 168 hours.  HbA1C: HbA1c, POC (prediabetic range)  Date/Time Value Ref Range Status  08/04/2018 02:37 PM 5.7 5.7 - 6.4 % Final   HbA1c, POC (controlled diabetic range)  Date/Time Value Ref Range Status  05/10/2021 02:03 PM 5.8 0.0 - 7.0 % Final   HbA1c POC (<> result, manual entry)  Date/Time Value Ref Range Status  02/26/2020 03:19 PM 5.4 4.0 - 5.6 % Final   Hgb A1c MFr Bld  Date/Time Value Ref Range Status  09/19/2021 04:19 AM 6.1 (H) 4.8 - 5.6 % Final    Comment:    (NOTE) Pre diabetes:          5.7%-6.4%  Diabetes:              >6.4%  Glycemic control for   <7.0% adults with diabetes   09/25/2019 11:36 AM 5.9 (H) 4.8 - 5.6 % Final    Comment:             Prediabetes: 5.7 - 6.4          Diabetes: >6.4          Glycemic control for adults with diabetes: <7.0     CBG: Recent Labs  Lab 09/20/21 0822 09/20/21 1128 09/20/21 1804 09/20/21 2105 09/21/21 0814  GLUCAP 214* 106* 107* 130* 143*    Critical care time: n/a > 35 minutes spent in consultation with the patient, reviewing records, managing medications   Roselie Awkward, MD [place] PCCM Pager: [phone] Cell: [phone] After 7:00 pm call Elink  [phone]

## 2021-09-21 NOTE — Progress Notes (Deleted)
PROGRESS NOTE  Hannah Baldwin QVZ:[id number] DOB: 12-28-1970 DOA: 09/18/2021 PCP: Ladell Pier, MD  Brief History   Hannah Baldwin is a 50 y.o. female with medical history significant of asthma, non-insulin-dependent type II diabetes, depression, morbid obesity presenting to the ED complaining of cough, shortness of breath, and wheezing.  On arrival to the ED, she was in respiratory distress.  Wheezing and tachypneic.  Not hypoxic.  She was treated with Solu-Medrol 125 mg, IV magnesium 2 g, and DuoNeb x3.  Not febrile.  Labs showing WBC 15.0.  Hemoglobin 10.5, no significant change from baseline.  Potassium 3.1.  COVID and flu negative.  Chest x-ray negative for acute finding.   Patient reports shortness of breath, cough, wheezing, and chest tightness.  Symptoms started 4 days ago and getting progressively worse.  She is using all of her home inhalers and also her nebulizer but not improving.  States she feels very short of breath when walking and even at rest when talking.  She stopped smoking cigarettes 6 months ago.  The patient is complaining of pleuritic chest pain and hypoxia into the 70's with any attempt at activity. She is saturating in the 90's on room air at rest. She is exhibiting 3 word dyspnea with conversation.  Consultants  None  Procedures  None  Antibiotics   Anti-infectives (From admission, onward)    Start     Dose/Rate Route Frequency Ordered Stop   09/21/21 1000  azithromycin (ZITHROMAX) tablet 250 mg       See Hyperspace for full Linked Orders Report.   250 mg Oral Daily 09/20/21 1603 09/25/21 0959   09/20/21 1700  azithromycin (ZITHROMAX) tablet 500 mg       See Hyperspace for full Linked Orders Report.   500 mg Oral Daily 09/20/21 1603 09/20/21 1800       Subjective  Although she states that she is feeling better than when she arrived, the patient continues to have severe dyspnea and cough with conversation ans sever hypoxia with any activity.    Objective   Vitals:  Vitals:   09/21/21 0819 09/21/21 0843  BP: 129/75   Pulse: 83   Resp: 19   Temp: 98.4 F (36.9 C)   SpO2: 97% 98%    Exam:  Exam:  Constitutional:  The patient is awake, alert, and oriented x 3. Moderate distress from dyspnea. Respiratory:  No increased work of breathing. No wheezes, rales, or rhonchi No tactile fremitus Cardiovascular:  Regular rate and rhythm No murmurs, ectopy, or gallups. No lateral PMI. No thrills. Abdomen:  Abdomen is soft, non-tender, non-distended No hernias, masses, or organomegaly Normoactive bowel sounds.  Musculoskeletal:  No cyanosis, clubbing, or edema Skin:  No rashes, lesions, ulcers palpation of skin: no induration or nodules Neurologic:  CN 2-12 intact Sensation all 4 extremities intact Psychiatric:  Mental status Mood, affect appropriate Orientation to person, place, time  judgment and insight appear intact   I have personally reviewed the following:   Today's Data  vitals  Lab Data  CBC, BMP  Micro Data    Imaging    Cardiology Data    Other Data    Scheduled Meds:  albuterol  2.5 mg Nebulization Q4H   arformoterol  15 mcg Nebulization BID   azithromycin  250 mg Oral Daily   budesonide (PULMICORT) nebulizer solution  0.5 mg Nebulization BID   dextromethorphan-guaiFENesin  2 tablet Oral BID   DULoxetine  60 mg Oral Daily   enoxaparin (  LOVENOX) injection  65 mg Subcutaneous Q24H   fluticasone  2 spray Each Nare Daily   hydrochlorothiazide  25 mg Oral Daily   insulin aspart  0-5 Units Subcutaneous QHS   insulin aspart  0-9 Units Subcutaneous TID WC   ketorolac  30 mg Intravenous Q6H   losartan  50 mg Oral Daily   megestrol  40 mg Oral BID   metFORMIN  500 mg Oral BID WC   montelukast  10 mg Oral QHS   pantoprazole  40 mg Oral Q1200   predniSONE  20 mg Oral Q breakfast    Principal Problem:   Asthma exacerbation Active Problems:   Diabetes type 2, controlled ([place])    Essential hypertension   Hypokalemia   LOS: 2 days   A & P  Acute asthma exacerbation Patient complaining of progressively worsening dyspnea, cough, wheezing, and chest tightness x4 days.  She was in respiratory distress on arrival to the ED and was given Solu-Medrol 125 mg, IV magnesium 2 g, and DuoNeb x3.  Chest x-ray negative for acute finding.  COVID and flu negative.  EKG without acute ischemic changes.  Still wheezing but work of breathing has improved.   -She is currently requiring 5L O2 by nasal cannula to maintain saturations of 98%.  -IV Solu-Medrol 125 daily, DuoNeb every 6 hours, albuterol neb every 2 hours as needed, Pulmicort neb twice daily, Mucinex DM.  Continuous pulse ox, supplemental oxygen as needed to keep oxygen saturation above 92%. PCCM consulted. I appreciate their assistance.   Mild hypokalemia -Supplement and monitor.   Leukocytosis Likely reactive and in response to steroids.  Not febrile and chest x-ray not suggestive of pneumonia.  Not endorsing any other infectious symptoms. -Repeat CBC in a.m.   Non-insulin-dependent type 2 diabetes Appears to be well controlled - A1c 6.1  on 09/18/2021.  Metformin listed in home medications but patient tells me she is not taking it.  She is requesting regular diet.  -Sensitive sliding scale insulin ACHS   Depression -Continue duloxetine   Hypertension Pt with high BP. Even when systolic is relatively controlled the diastolic is in the 027'O. Will add hydrochlorothiazide. Suspect metabolic syndrome in this morbidly obese woman with DM and HTN on Megace for DUB. -Continue lisinopril.  Add HCTZ.   Morbid obesity (BMI 53.50) -Encourage lifestyle modifications such as healthy diet and exercise. Use of megace for DUB in this morbidly obese woman with hypertension and DM II is not helpful here. Understand that it is important to control of DUB without a procedure such as endometrial ablation, but it should be discontinued as  soon as another modality of controlling DUB is possible.  DUB:  Patient has been on Megace for 2 years for this indication. While it is effective in controlling her debilitating DUB, it is contributing to the patient's severe morbid obesity, hypertension, and DM II - metabolic syndrome. She is working on getting Medicaid coverage. Hopefully this will put a procedure such as endometrial ablation within her grasp financially so Megace can be stopped. Will check lipid panel.  I have seen and examined this patient myself. I have spent 32 minutes in her evaluation and care.   DVT prophylaxis: Lovenox Code Status: Full code Family Communication: No family at bedside. Disposition Plan: Status is: Inpatient. Disposition will be to home.   Level of care: Level of care: Telemetry Medical   The medical decision making on this patient was of high complexity and the patient is at  high risk for clinical deterioration, therefore this is a level 3 visit.   Jolayne Branson, DO [facility]   If 7PM-7AM, please contact night-coverage [email or web address]  Baron Parmelee, DO [facility] Direct contact: see [email or web address]  7PM-7AM contact night coverage as above 09/20/2021, 7:04:42 PM  LOS: 0 days

## 2021-09-21 NOTE — Telephone Encounter (Signed)
-----   Message from Juanito Doom, MD sent at 09/21/2021  8:35 AM EST ----- Hi,  Please arrange hospital f/u for 3-4 weeks with Dr. Erin Fulling or an available APP  Reason: asthma, cigarette smoker, probable vocal cord dysfunction  Thanks Ruby Cola

## 2021-09-21 NOTE — Telephone Encounter (Signed)
Patient has been scheduled for a hospital follow up. Information will print on their AVS at discharge. Nothing further needed at this time.   Next Appt With Pulmonology Freddi Starr, MD) 10/16/2021 at 11:30 AM

## 2021-09-21 NOTE — Progress Notes (Signed)
PROGRESS NOTE  Hannah Baldwin NAT:[id number] DOB: 05/01/1971 DOA: 09/18/2021 PCP: Ladell Pier, MD  Brief History   Hannah Baldwin is a 50 y.o. female with medical history significant of asthma, non-insulin-dependent type II diabetes, depression, morbid obesity presenting to the ED complaining of cough, shortness of breath, and wheezing.  On arrival to the ED, she was in respiratory distress.  Wheezing and tachypneic.  Not hypoxic.  She was treated with Solu-Medrol 125 mg, IV magnesium 2 g, and DuoNeb x3.  Not febrile.  Labs showing WBC 15.0.  Hemoglobin 10.5, no significant change from baseline.  Potassium 3.1.  COVID and flu negative.  Chest x-ray negative for acute finding.   Patient reports shortness of breath, cough, wheezing, and chest tightness.  Symptoms started 4 days ago and getting progressively worse.  She is using all of her home inhalers and also her nebulizer but not improving.  States she feels very short of breath when walking and even at rest when talking.  She stopped smoking cigarettes 6 months ago.  The patient continues to complain of cough and pleuritic chest pain. She is currently requiring 4 L to maintain saturations of 100%. She was turned down to 3L, but felt very short of breath subjectively.  Consultants  None  Procedures  None  Antibiotics   Anti-infectives (From admission, onward)    Start     Dose/Rate Route Frequency Ordered Stop   09/21/21 1000  azithromycin (ZITHROMAX) tablet 250 mg       See Hyperspace for full Linked Orders Report.   250 mg Oral Daily 09/20/21 1603 09/25/21 0959   09/20/21 1700  azithromycin (ZITHROMAX) tablet 500 mg       See Hyperspace for full Linked Orders Report.   500 mg Oral Daily 09/20/21 1603 09/20/21 1800       Subjective  Pt continues to complain of cough, dyspnea, and pleuritic chest pain. She states that she did not know that she had to ask for pain medication and tussionex.  Objective   Vitals:  Vitals:    09/21/21 1610 09/21/21 1641  BP: (!) 146/84   Pulse: (!) 101   Resp: 19   Temp: 97.8 F (36.6 C)   SpO2: 100% 100%    Exam:  Constitutional:  The patient is awake, alert, and oriented x 3. Moderate distress from dyspnea. Respiratory:  No increased work of breathing. No wheezes, rales, or rhonchi No tactile fremitus Cardiovascular:  Regular rate and rhythm No murmurs, ectopy, or gallups. No lateral PMI. No thrills. Abdomen:  Abdomen is soft, non-tender, non-distended No hernias, masses, or organomegaly Normoactive bowel sounds.  Musculoskeletal:  No cyanosis, clubbing, or edema Skin:  No rashes, lesions, ulcers palpation of skin: no induration or nodules Neurologic:  CN 2-12 intact Sensation all 4 extremities intact Psychiatric:  Mental status Mood, affect appropriate Orientation to person, place, time  judgment and insight appear intact   I have personally reviewed the following:   Today's Data  vitals  Lab Data  CBC, CMP  Micro Data    Imaging    Cardiology Data    Other Data    Scheduled Meds:  albuterol  2.5 mg Nebulization Q4H   arformoterol  15 mcg Nebulization BID   azithromycin  250 mg Oral Daily   budesonide (PULMICORT) nebulizer solution  0.5 mg Nebulization BID   dextromethorphan-guaiFENesin  2 tablet Oral BID   DULoxetine  60 mg Oral Daily   enoxaparin (LOVENOX) injection  65  mg Subcutaneous Q24H   fluticasone  2 spray Each Nare Daily   hydrochlorothiazide  25 mg Oral Daily   insulin aspart  0-5 Units Subcutaneous QHS   insulin aspart  0-9 Units Subcutaneous TID WC   ketorolac  30 mg Intravenous Q6H   losartan  50 mg Oral Daily   megestrol  40 mg Oral BID   metFORMIN  500 mg Oral BID WC   montelukast  10 mg Oral QHS   pantoprazole  40 mg Oral Q1200   predniSONE  20 mg Oral Q breakfast    Principal Problem:   Asthma exacerbation Active Problems:   Diabetes type 2, controlled ([place])   Essential hypertension    Hypokalemia   LOS: 2 days   A & P  Acute asthma exacerbation Patient complaining of progressively worsening dyspnea, cough, wheezing, and chest tightness x4 days.  She was in respiratory distress on arrival to the ED and was given Solu-Medrol 125 mg, IV magnesium 2 g, and DuoNeb x3.  Chest x-ray negative for acute finding.  COVID and flu negative.  EKG without acute ischemic changes.  Still wheezing but work of breathing has improved.   -She is currently requiring 5L O2 by nasal cannula to maintain saturations of 98%.  -IV Solu-Medrol 125 daily, DuoNeb every 6 hours, albuterol neb every 2 hours as needed, Pulmicort neb twice daily, Mucinex DM.  Continuous pulse ox, supplemental oxygen as needed to keep oxygen saturation above 92%. PCCM consulted. I appreciate their assistance.   Mild hypokalemia -Resolved. -Supplement and monitor.   Leukocytosis Likely reactive and in response to steroids.  Not febrile and chest x-ray not suggestive of pneumonia.  Not endorsing any other infectious symptoms. -Repeat CBC in a.m.   Non-insulin-dependent type 2 diabetes Appears to be well controlled - A1c 6.1  on 09/18/2021.  Metformin listed in home medications but patient tells me she is not taking it.  She is requesting regular diet.  -Sensitive sliding scale insulin ACHS   Depression -Continue duloxetine   Hypertension Pt with high BP. Even when systolic is relatively controlled the diastolic is in the 601'U. Will add hydrochlorothiazide. Suspect metabolic syndrome in this morbidly obese woman with DM and HTN on Megace for DUB. -Continue lisinopril.  Add HCTZ.   Morbid obesity (BMI 53.50) -Encourage lifestyle modifications such as healthy diet and exercise. Use of megace for DUB in this morbidly obese woman with hypertension and DM II is not helpful here. Understand that it is important to control of DUB without a procedure such as endometrial ablation, but it should be discontinued as soon as  another modality of controlling DUB is possible.  DUB:  Patient has been on Megace for 2 years for this indication. While it is effective in controlling her debilitating DUB, it is contributing to the patient's severe morbid obesity, hypertension, and DM II - metabolic syndrome. She is working on getting Medicaid coverage. Hopefully this will put a procedure such as endometrial ablation within her grasp financially so Megace can be stopped. Will check lipid panel.  I have seen and examined this patient myself. I have spent 30 minutes in her evaluation and care.   DVT prophylaxis: Lovenox Code Status: Full code Family Communication: No family at bedside. Disposition Plan: Status is: Inpatient. Disposition will be to home.   Level of care: Level of care: Telemetry Medical    Hannah Mongiello, DO [facility]   If 7PM-7AM, please contact night-coverage [email or web address]  Hannah Byas, DO [facility] Direct contact: see [email or web address]  7PM-7AM contact night coverage as above 09/21/2021, 7:15:42 PM  LOS: 0 days

## 2021-09-22 LAB — CBC WITH DIFFERENTIAL/PLATELET
Abs Immature Granulocytes: 0.27 10*3/uL — ABNORMAL HIGH (ref 0.00–0.07)
Basophils Absolute: 0.1 10*3/uL (ref 0.0–0.1)
Basophils Relative: 0 %
Eosinophils Absolute: 0.2 10*3/uL (ref 0.0–0.5)
Eosinophils Relative: 1 %
HCT: 38 % (ref 36.0–46.0)
Hemoglobin: 11.2 g/dL — ABNORMAL LOW (ref 12.0–15.0)
Immature Granulocytes: 2 %
Lymphocytes Relative: 26 %
Lymphs Abs: 4.5 10*3/uL — ABNORMAL HIGH (ref 0.7–4.0)
MCH: 25.5 pg — ABNORMAL LOW (ref 26.0–34.0)
MCHC: 29.5 g/dL — ABNORMAL LOW (ref 30.0–36.0)
MCV: 86.6 fL (ref 80.0–100.0)
Monocytes Absolute: 0.9 10*3/uL (ref 0.1–1.0)
Monocytes Relative: 5 %
Neutro Abs: 11.3 10*3/uL — ABNORMAL HIGH (ref 1.7–7.7)
Neutrophils Relative %: 66 %
Platelets: 415 10*3/uL — ABNORMAL HIGH (ref 150–400)
RBC: 4.39 MIL/uL (ref 3.87–5.11)
RDW: 15.6 % — ABNORMAL HIGH (ref 11.5–15.5)
WBC: 17.1 10*3/uL — ABNORMAL HIGH (ref 4.0–10.5)
nRBC: 0 % (ref 0.0–0.2)

## 2021-09-22 LAB — GLUCOSE, CAPILLARY
Glucose-Capillary: 69 mg/dL — ABNORMAL LOW (ref 70–99)
Glucose-Capillary: 130 mg/dL — ABNORMAL HIGH (ref 70–99)
Glucose-Capillary: 116 mg/dL — ABNORMAL HIGH (ref 70–99)
Glucose-Capillary: 182 mg/dL — ABNORMAL HIGH (ref 70–99)
Glucose-Capillary: 138 mg/dL — ABNORMAL HIGH (ref 70–99)

## 2021-09-22 LAB — BASIC METABOLIC PANEL
Anion gap: 4 — ABNORMAL LOW (ref 5–15)
BUN: 22 mg/dL — ABNORMAL HIGH (ref 6–20)
CO2: 31 mmol/L (ref 22–32)
Calcium: 8.8 mg/dL — ABNORMAL LOW (ref 8.9–10.3)
Chloride: 103 mmol/L (ref 98–111)
Creatinine, Ser: 1.04 mg/dL — ABNORMAL HIGH (ref 0.44–1.00)
GFR, Estimated: >60 mL/min (ref >60)
Glucose, Bld: 125 mg/dL — ABNORMAL HIGH (ref 70–99)
Potassium: 3.8 mmol/L (ref 3.5–5.1)
Sodium: 138 mmol/L (ref 135–145)

## 2021-09-22 MED ORDER — METHYLPREDNISOLONE SODIUM SUCC 40 MG IJ SOLR
40.0000 mg | Freq: Two times a day (BID) | INTRAMUSCULAR | Status: DC
  Administered 2021-09-22 – 2021-09-24 (×4): 40 mg via INTRAVENOUS
  Filled 2021-09-22 (×4): qty 1

## 2021-09-22 NOTE — Plan of Care (Signed)
Patient ID: Hannah Baldwin, female   DOB: March 16, 1971, 50 y.o.   MRN: [id number]  Problem: Education: Goal: Knowledge of General Education information will improve Description: Including pain rating scale, medication(s)/side effects and non-pharmacologic comfort measures Outcome: Progressing   Problem: Health Behavior/Discharge Planning: Goal: Ability to manage health-related needs will improve Outcome: Progressing   Problem: Clinical Measurements: Goal: Ability to maintain clinical measurements within normal limits will improve Outcome: Progressing Goal: Will remain free from infection Outcome: Progressing Goal: Diagnostic test results will improve Outcome: Progressing Goal: Respiratory complications will improve Outcome: Progressing Goal: Cardiovascular complication will be avoided Outcome: Progressing   Problem: Activity: Goal: Risk for activity intolerance will decrease Outcome: Progressing   Problem: Nutrition: Goal: Adequate nutrition will be maintained Outcome: Progressing   Problem: Coping: Goal: Level of anxiety will decrease Outcome: Progressing   Problem: Elimination: Goal: Will not experience complications related to bowel motility Outcome: Progressing Goal: Will not experience complications related to urinary retention Outcome: Progressing   Problem: Pain Managment: Goal: General experience of comfort will improve Outcome: Progressing   Problem: Safety: Goal: Ability to remain free from injury will improve Outcome: Progressing   Problem: Skin Integrity: Goal: Risk for impaired skin integrity will decrease Outcome: Progressing    Haydee Salter, RN

## 2021-09-22 NOTE — Progress Notes (Signed)
Patient ID: KEAJAH KILLOUGH, female   DOB: 1971/04/11, 50 y.o.   MRN: [id number] Order to wean O2. Turned oxygen down to 3L.  Haydee Salter, RN

## 2021-09-22 NOTE — Progress Notes (Signed)
PROGRESS NOTE  Hannah Baldwin QBH:[id number] DOB: Apr 05, 1971 DOA: 09/18/2021 PCP: Ladell Pier, MD  Brief History   Hannah Baldwin is a 50 y.o. female with medical history significant of asthma, non-insulin-dependent type II diabetes, depression, morbid obesity presenting to the ED complaining of cough, shortness of breath, and wheezing.  On arrival to the ED, she was in respiratory distress.  Wheezing and tachypneic.  Not hypoxic.  She was treated with Solu-Medrol 125 mg, IV magnesium 2 g, and DuoNeb x3.  Not febrile.  Labs showing WBC 15.0.  Hemoglobin 10.5, no significant change from baseline.  Potassium 3.1.  COVID and flu negative.  Chest x-ray negative for acute finding.   Patient reports shortness of breath, cough, wheezing, and chest tightness.  Symptoms started 4 days ago and getting progressively worse.  She is using all of her home inhalers and also her nebulizer but not improving.  States she feels very short of breath when walking and even at rest when talking.  She stopped smoking cigarettes 6 months ago.  The patient continues to complain of cough and pleuritic chest pain. She is currently requiring 4 L to maintain saturations of 100%. Patient is reticent to try to wean down O2. Consultants  None  Procedures  None  Antibiotics   Anti-infectives (From admission, onward)    Start     Dose/Rate Route Frequency Ordered Stop   09/21/21 1000  azithromycin (ZITHROMAX) tablet 250 mg       See Hyperspace for full Linked Orders Report.   250 mg Oral Daily 09/20/21 1603 09/25/21 0959   09/20/21 1700  azithromycin (ZITHROMAX) tablet 500 mg       See Hyperspace for full Linked Orders Report.   500 mg Oral Daily 09/20/21 1603 09/20/21 1800       Subjective  Pt is sitting up at the side of her bed visiting with a friend. Her speech is easier. No new complaints.  Objective   Vitals:  Vitals:   09/22/21 1546 09/22/21 1550  BP: 128/82   Pulse: 87   Resp: 20   Temp: 98.2  F (36.8 C)   SpO2: 100% 100%    Exam:  Constitutional:  The patient is awake, alert, and oriented x 3. No acute distress. Respiratory:  No increased work of breathing. No rales, or rhonchi Diffuse wheezes throughout No tactile fremitus Cardiovascular:  Regular rate and rhythm No murmurs, ectopy, or gallups. No lateral PMI. No thrills. Abdomen:  Abdomen is soft, non-tender, non-distended No hernias, masses, or organomegaly Normoactive bowel sounds.  Musculoskeletal:  No cyanosis, clubbing, or edema Skin:  No rashes, lesions, ulcers palpation of skin: no induration or nodules Neurologic:  CN 2-12 intact Sensation all 4 extremities intact Psychiatric:  Mental status Mood, affect appropriate Orientation to person, place, time  judgment and insight appear intact  I have personally reviewed the following:   Today's Data  vitals  Lab Data  CBC, CMP  Micro Data    Imaging  CXR  Cardiology Data    Other Data    Scheduled Meds:  albuterol  2.5 mg Nebulization Q4H   arformoterol  15 mcg Nebulization BID   azithromycin  250 mg Oral Daily   budesonide (PULMICORT) nebulizer solution  0.5 mg Nebulization BID   dextromethorphan-guaiFENesin  2 tablet Oral BID   DULoxetine  60 mg Oral Daily   enoxaparin (LOVENOX) injection  65 mg Subcutaneous Q24H   fluticasone  2 spray Each Nare Daily  hydrochlorothiazide  25 mg Oral Daily   ketorolac  30 mg Intravenous Q6H   losartan  50 mg Oral Daily   megestrol  40 mg Oral BID   metFORMIN  500 mg Oral BID WC   methylPREDNISolone (SOLU-MEDROL) injection  40 mg Intravenous Q12H   montelukast  10 mg Oral QHS   pantoprazole  40 mg Oral Q1200    Principal Problem:   Asthma exacerbation Active Problems:   Diabetes type 2, controlled ([place])   Essential hypertension   Hypokalemia   LOS: 3 days   A & P  Acute asthma exacerbation Patient complaining of progressively worsening dyspnea, cough, wheezing, and chest tightness  x4 days.  She was in respiratory distress on arrival to the ED and was given Solu-Medrol 125 mg, IV magnesium 2 g, and DuoNeb x3.  Chest x-ray negative for acute finding.  COVID and flu negative.  EKG without acute ischemic changes.  Still wheezing but work of breathing has improved.   -She is currently requiring 5L O2 by nasal cannula to maintain saturations of 98%.  -IV Solu-Medrol 125 daily, DuoNeb every 6 hours, albuterol neb every 2 hours as needed, Pulmicort neb twice daily, Mucinex DM.  Continuous pulse ox, supplemental oxygen as needed to keep oxygen saturation above 92%. PCCM consulted. I appreciate their assistance.  Acute Hypoxic Respiratory Failure Pt is saturating 100% on 4L. She is afraid to reduce FIO2. I have encouraged her to try reassuring her that we are watching her SaO2 and that we will turn it back up if we have to.   Mild hypokalemia -Resolved. -Supplement and monitor.   Leukocytosis Likely reactive and in response to steroids.  Not febrile and chest x-ray not suggestive of pneumonia.  Not endorsing any other infectious symptoms. -Repeat CBC in a.m.   Non-insulin-dependent type 2 diabetes Appears to be well controlled - A1c 6.1  on 09/18/2021.  Metformin listed in home medications but patient tells me she is not taking it.  She is requesting regular diet.  -Sensitive sliding scale insulin ACHS   Depression -Continue duloxetine   Hypertension Pt with high BP. Even when systolic is relatively controlled the diastolic is in the 193'X. Will add hydrochlorothiazide. Suspect metabolic syndrome in this morbidly obese woman with DM and HTN on Megace for DUB. -Continue lisinopril.  Add HCTZ.   Morbid obesity (BMI 53.50) -Encourage lifestyle modifications such as healthy diet and exercise. Use of megace for DUB in this morbidly obese woman with hypertension and DM II is not helpful here. Understand that it is important to control of DUB without a procedure such as  endometrial ablation, but it should be discontinued as soon as another modality of controlling DUB is possible.  DUB:  Patient has been on Megace for 2 years for this indication. While it is effective in controlling her debilitating DUB, it is contributing to the patient's severe morbid obesity, hypertension, and DM II - metabolic syndrome. She is working on getting Medicaid coverage. Hopefully this will put a procedure such as endometrial ablation within her grasp financially so Megace can be stopped. Will check lipid panel.  I have seen and examined this patient myself. I have spent 30 minutes in her evaluation and care.   DVT prophylaxis: Lovenox Code Status: Full code Family Communication: No family at bedside. Disposition Plan: Status is: Inpatient. Disposition will be to home.   Level of care: Level of care: Telemetry Medical    Chelsy Parrales, DO [facility]  If 7PM-7AM, please contact night-coverage [email or web address]  Aalijah Mims, DO [facility] Direct contact: see [email or web address]  7PM-7AM contact night coverage as above 09/22/2021, 5:07 PM  LOS: 0 days

## 2021-09-23 LAB — GLUCOSE, CAPILLARY
Glucose-Capillary: 218 mg/dL — ABNORMAL HIGH (ref 70–99)
Glucose-Capillary: 100 mg/dL — ABNORMAL HIGH (ref 70–99)
Glucose-Capillary: 169 mg/dL — ABNORMAL HIGH (ref 70–99)
Glucose-Capillary: 157 mg/dL — ABNORMAL HIGH (ref 70–99)

## 2021-09-23 NOTE — Progress Notes (Signed)
PROGRESS NOTE  SHALONDRA WUNSCHEL NTZ:[id number] DOB: 1971-10-04 DOA: 09/18/2021 PCP: Ladell Pier, MD  Brief History   Hannah Baldwin is a 50 y.o. female with medical history significant of asthma, non-insulin-dependent type II diabetes, depression, morbid obesity presenting to the ED complaining of cough, shortness of breath, and wheezing.  On arrival to the ED, she was in respiratory distress.  Wheezing and tachypneic.  Not hypoxic.  She was treated with Solu-Medrol 125 mg, IV magnesium 2 g, and DuoNeb x3.  Not febrile.  Labs showing WBC 15.0.  Hemoglobin 10.5, no significant change from baseline.  Potassium 3.1.  COVID and flu negative.  Chest x-ray negative for acute finding.   Patient reports shortness of breath, cough, wheezing, and chest tightness.  Symptoms started 4 days ago and getting progressively worse.  She is using all of her home inhalers and also her nebulizer but not improving.  States she feels very short of breath when walking and even at rest when talking.  She stopped smoking cigarettes 6 months ago.  The patient continues to complain of cough and pleuritic chest pain. She is currently requiring 4 L to maintain saturations of 100%. Patient is reticent to try to wean down O2. Consultants  None  Procedures  None  Antibiotics   Anti-infectives (From admission, onward)    Start     Dose/Rate Route Frequency Ordered Stop   09/21/21 1000  azithromycin (ZITHROMAX) tablet 250 mg       See Hyperspace for full Linked Orders Report.   250 mg Oral Daily 09/20/21 1603 09/25/21 0959   09/20/21 1700  azithromycin (ZITHROMAX) tablet 500 mg       See Hyperspace for full Linked Orders Report.   500 mg Oral Daily 09/20/21 1603 09/20/21 1800       Subjective  Pt is sitting up at the side of her bed visiting with a friend. Her speech is easier. No new complaints.  Objective   Vitals:  Vitals:   09/23/21 1521 09/23/21 1527  BP: 132/69 136/69  Pulse: 81   Resp: (!) 26 (!)  24  Temp: 98.4 F (36.9 C) 98.4 F (36.9 C)  SpO2: 95% 96%    Exam:  Constitutional:  The patient is awake, alert, and oriented x 3. No acute distress. Respiratory:  No increased work of breathing. No rales, or rhonchi Diffuse wheezes throughout No tactile fremitus Cardiovascular:  Regular rate and rhythm No murmurs, ectopy, or gallups. No lateral PMI. No thrills. Abdomen:  Abdomen is soft, non-tender, non-distended No hernias, masses, or organomegaly Normoactive bowel sounds.  Musculoskeletal:  No cyanosis, clubbing, or edema Skin:  No rashes, lesions, ulcers palpation of skin: no induration or nodules Neurologic:  CN 2-12 intact Sensation all 4 extremities intact Psychiatric:  Mental status Mood, affect appropriate Orientation to person, place, time  judgment and insight appear intact  I have personally reviewed the following:   Today's Data  vitals  Lab Data  CBC, CMP  Micro Data    Imaging  CXR  Cardiology Data    Other Data    Scheduled Meds:  albuterol  2.5 mg Nebulization Q4H   arformoterol  15 mcg Nebulization BID   azithromycin  250 mg Oral Daily   budesonide (PULMICORT) nebulizer solution  0.5 mg Nebulization BID   dextromethorphan-guaiFENesin  2 tablet Oral BID   DULoxetine  60 mg Oral Daily   enoxaparin (LOVENOX) injection  65 mg Subcutaneous Q24H   fluticasone  2 spray  Each Nare Daily   hydrochlorothiazide  25 mg Oral Daily   ketorolac  30 mg Intravenous Q6H   losartan  50 mg Oral Daily   megestrol  40 mg Oral BID   metFORMIN  500 mg Oral BID WC   methylPREDNISolone (SOLU-MEDROL) injection  40 mg Intravenous Q12H   montelukast  10 mg Oral QHS   pantoprazole  40 mg Oral Q1200    Principal Problem:   Asthma exacerbation Active Problems:   Diabetes type 2, controlled ([place])   Essential hypertension   Hypokalemia    LOS: 4 days   A & P  Acute asthma exacerbation Patient complaining of progressively worsening dyspnea,  cough, wheezing, and chest tightness x4 days.  She was in respiratory distress on arrival to the ED and was given Solu-Medrol 125 mg, IV magnesium 2 g, and DuoNeb x3.  Chest x-ray negative for acute finding.  COVID and flu negative.  EKG without acute ischemic changes.  Still wheezing but work of breathing has improved.   -She is currently requiring 5L O2 by nasal cannula to maintain saturations of 98%.  -IV Solu-Medrol 125 daily, DuoNeb every 6 hours, albuterol neb every 2 hours as needed, Pulmicort neb twice daily, Mucinex DM.  Continuous pulse ox, supplemental oxygen as needed to keep oxygen saturation above 92%. PCCM consulted. I appreciate their assistance.  Acute Hypoxic Respiratory Failure Today the Pt is saturating 100% on 6L. She is afraid to reduce FIO2. I have encouraged her to try reassuring her that we are watching her SaO2 and that we will turn it back up if we have to. However, it is quite possible that the patient will need to discharge on O2.   Mild hypokalemia -Resolved. -Supplement and monitor.   Leukocytosis Likely reactive and in response to steroids.  Not febrile and chest x-ray not suggestive of pneumonia.  Not endorsing any other infectious symptoms. -Repeat CBC in a.m.   Non-insulin-dependent type 2 diabetes Appears to be well controlled - A1c 6.1  on 09/18/2021.  Metformin listed in home medications but patient tells me she is not taking it.  She is requesting regular diet.  -Sensitive sliding scale insulin ACHS   Depression -Continue duloxetine   Hypertension Pt with high BP. Even when systolic is relatively controlled the diastolic is in the 353'G. Will add hydrochlorothiazide. Suspect metabolic syndrome in this morbidly obese woman with DM and HTN on Megace for DUB. -Continue lisinopril.  Add HCTZ.   Morbid obesity (BMI 53.50) -Encourage lifestyle modifications such as healthy diet and exercise. Use of megace for DUB in this morbidly obese woman with  hypertension and DM II is not helpful here. Understand that it is important to control of DUB without a procedure such as endometrial ablation, but it should be discontinued as soon as another modality of controlling DUB is possible.  DUB:  Patient has been on Megace for 2 years for this indication. While it is effective in controlling her debilitating DUB, it is contributing to the patient's severe morbid obesity, hypertension, and DM II - metabolic syndrome. She is working on getting Medicaid coverage. Hopefully this will put a procedure such as endometrial ablation within her grasp financially so Megace can be stopped. Will check lipid panel.  I have seen and examined this patient myself. I have spent 30 minutes in her evaluation and care.   DVT prophylaxis: Lovenox Code Status: Full code Family Communication: No family at bedside. Disposition Plan: Status is: Inpatient. Disposition will be  to home.   Level of care: Level of care: Telemetry Medical    Kierre Hintz, DO [facility]   If 7PM-7AM, please contact night-coverage [email or web address]  Srija Southard, DO [facility] Direct contact: see [email or web address]  7PM-7AM contact night coverage as above 09/23/2021, 5:14 PM  LOS: 0 days

## 2021-09-23 NOTE — Progress Notes (Signed)
Mobility Specialist Progress Note    09/23/21 1520  Mobility  Activity Ambulated in room  Level of Assistance Independent  Assistive Device None  Minutes Ambulated 3 minutes  Mobility Ambulated independently in room  Mobility Response Tolerated fair  Mobility performed by Mobility specialist  Bed Position Chair  $Mobility charge 1 Mobility   Pt received in bed and agreeable. Walked back and forth from window to door in room. Pt held conversation but audibly SOB and starting to wheeze. Respiratory came for a breathing treatment and pt left in chair. Will f/u tomorrow.   [facility] Mobility Specialist  M.S. Primary Phone: [phone] M.S. Secondary Phone: [phone]

## 2021-09-24 LAB — GLUCOSE, CAPILLARY
Glucose-Capillary: 140 mg/dL — ABNORMAL HIGH (ref 70–99)
Glucose-Capillary: 160 mg/dL — ABNORMAL HIGH (ref 70–99)
Glucose-Capillary: 160 mg/dL — ABNORMAL HIGH (ref 70–99)

## 2021-09-24 MED ORDER — METHYLPREDNISOLONE SODIUM SUCC 125 MG IJ SOLR
60.0000 mg | Freq: Every day | INTRAMUSCULAR | Status: DC
  Administered 2021-09-25 – 2021-09-26 (×2): 60 mg via INTRAVENOUS
  Filled 2021-09-24 (×2): qty 2

## 2021-09-24 NOTE — Progress Notes (Signed)
PROGRESS NOTE  Hannah Baldwin ZOX:[id number] DOB: 12-01-1970 DOA: 09/18/2021 PCP: Ladell Pier, MD  Brief History   Hannah Baldwin is a 50 y.o. female with medical history significant of asthma, non-insulin-dependent type II diabetes, depression, morbid obesity presenting to the ED complaining of cough, shortness of breath, and wheezing.  On arrival to the ED, she was in respiratory distress.  Wheezing and tachypneic.  Not hypoxic.  She was treated with Solu-Medrol 125 mg, IV magnesium 2 g, and DuoNeb x3.  Not febrile.  Labs showing WBC 15.0.  Hemoglobin 10.5, no significant change from baseline.  Potassium 3.1.  COVID and flu negative.  Chest x-ray negative for acute finding.   Patient reports shortness of breath, cough, wheezing, and chest tightness.  Symptoms started 4 days ago and getting progressively worse.  She is using all of her home inhalers and also her nebulizer but not improving.  States she feels very short of breath when walking and even at rest when talking.  She stopped smoking cigarettes 6 months ago.  The patient continues to complain of cough and pleuritic chest pain. She is currently requiring 4 L to maintain saturations of 100%. Patient is still reticent to try to wean down O2. I have encouraged her to remain calm when O2 is reduced. Consultants  None  Procedures  None  Antibiotics   Anti-infectives (From admission, onward)    Start     Dose/Rate Route Frequency Ordered Stop   09/21/21 1000  azithromycin (ZITHROMAX) tablet 250 mg       See Hyperspace for full Linked Orders Report.   250 mg Oral Daily 09/20/21 1603 09/24/21 0931   09/20/21 1700  azithromycin (ZITHROMAX) tablet 500 mg       See Hyperspace for full Linked Orders Report.   500 mg Oral Daily 09/20/21 1603 09/20/21 1800       Subjective  Pt is lying in bed. She states that she is feeling tired today. No new complaints.  Objective   Vitals:  Vitals:   09/24/21 0808 09/24/21 0913  BP:  (!)  145/90  Pulse:  83  Resp:  19  Temp:  98.3 F (36.8 C)  SpO2: 99% 97%    Exam:  Constitutional:  The patient is awake, alert, and oriented x 3. No acute distress. Respiratory:  No increased work of breathing. No rales, or rhonchi Diffuse wheezes throughout No tactile fremitus Cardiovascular:  Regular rate and rhythm No murmurs, ectopy, or gallups. No lateral PMI. No thrills. Abdomen:  Abdomen is soft, non-tender, non-distended No hernias, masses, or organomegaly Normoactive bowel sounds.  Musculoskeletal:  No cyanosis, clubbing, or edema Skin:  No rashes, lesions, ulcers palpation of skin: no induration or nodules Neurologic:  CN 2-12 intact Sensation all 4 extremities intact Psychiatric:  Mental status Mood, affect appropriate Orientation to person, place, time  judgment and insight appear intact  I have personally reviewed the following:   Today's Data  vitals  Lab Data  CBC, CMP  Micro Data    Imaging  CXR  Cardiology Data    Other Data    Scheduled Meds:  albuterol  2.5 mg Nebulization Q4H   arformoterol  15 mcg Nebulization BID   budesonide (PULMICORT) nebulizer solution  0.5 mg Nebulization BID   dextromethorphan-guaiFENesin  2 tablet Oral BID   DULoxetine  60 mg Oral Daily   enoxaparin (LOVENOX) injection  65 mg Subcutaneous Q24H   fluticasone  2 spray Each Nare Daily  hydrochlorothiazide  25 mg Oral Daily   losartan  50 mg Oral Daily   megestrol  40 mg Oral BID   metFORMIN  500 mg Oral BID WC   [START ON 09/25/2021] methylPREDNISolone (SOLU-MEDROL) injection  60 mg Intravenous Daily   montelukast  10 mg Oral QHS   pantoprazole  40 mg Oral Q1200    Principal Problem:   Asthma exacerbation Active Problems:   Diabetes type 2, controlled ([place])   Essential hypertension   Hypokalemia    LOS: 5 days   A & P  Acute asthma exacerbation Patient complaining of progressively worsening dyspnea, cough, wheezing, and chest tightness x4  days.  She was in respiratory distress on arrival to the ED and was given Solu-Medrol 125 mg, IV magnesium 2 g, and DuoNeb x3.  Chest x-ray negative for acute finding.  COVID and flu negative.  EKG without acute ischemic changes.  Still wheezing but work of breathing has improved.   -She is currently requiring 5L O2 by nasal cannula to maintain saturations of 98%.  -IV Solu-Medrol 125 daily, DuoNeb every 6 hours, albuterol neb every 2 hours as needed, Pulmicort neb twice daily, Mucinex DM.  Continuous pulse ox, supplemental oxygen as needed to keep oxygen saturation above 92%. PCCM consulted. I appreciate their assistance.  Acute Hypoxic Respiratory Failure Today the Pt is saturating 100% on 4L. S I have encouraged her to try reassuring her that we are watching her SaO2 and that we will turn it back up if we have to. However, it is quite possible that the patient will need to discharge on O2.   Mild hypokalemia -Resolved. -Supplement and monitor.   Leukocytosis Likely reactive and in response to steroids.  Not febrile and chest x-ray not suggestive of pneumonia.  Not endorsing any other infectious symptoms. -Repeat CBC in a.m.   Non-insulin-dependent type 2 diabetes Appears to be well controlled - A1c 6.1  on 09/18/2021.  Metformin listed in home medications but patient tells me she is not taking it.  She is requesting regular diet.  -Sensitive sliding scale insulin ACHS   Depression -Continue duloxetine   Hypertension Pt with high BP. Even when systolic is relatively controlled the diastolic is in the 654'Y. Will add hydrochlorothiazide. Suspect metabolic syndrome in this morbidly obese woman with DM and HTN on Megace for DUB. -Continue lisinopril.  Add HCTZ.   Morbid obesity (BMI 53.50) -Encourage lifestyle modifications such as healthy diet and exercise. Use of megace for DUB in this morbidly obese woman with hypertension and DM II is not helpful here. Understand that it is important  to control of DUB without a procedure such as endometrial ablation, but it should be discontinued as soon as another modality of controlling DUB is possible.  DUB:  Patient has been on Megace for 2 years for this indication. While it is effective in controlling her debilitating DUB, it is contributing to the patient's severe morbid obesity, hypertension, and DM II - metabolic syndrome. She is working on getting Medicaid coverage. Hopefully this will put a procedure such as endometrial ablation within her grasp financially so Megace can be stopped. Will check lipid panel.  I have seen and examined this patient myself. I have spent 32 minutes in her evaluation and care.   DVT prophylaxis: Lovenox Code Status: Full code Family Communication: No family at bedside. Disposition Plan: Status is: Inpatient. Disposition will be to home.   Level of care: Level of care: Telemetry Medical  Jakaylee Sasaki, DO [facility]   If 7PM-7AM, please contact night-coverage [email or web address]  Hero Kulish, DO [facility] Direct contact: see [email or web address]  7PM-7AM contact night coverage as above 09/24/2021, 4:16 PM  LOS: 0 days

## 2021-09-25 LAB — BASIC METABOLIC PANEL
Anion gap: 5 (ref 5–15)
BUN: 16 mg/dL (ref 6–20)
CO2: 31 mmol/L (ref 22–32)
Calcium: 8.5 mg/dL — ABNORMAL LOW (ref 8.9–10.3)
Chloride: 101 mmol/L (ref 98–111)
Creatinine, Ser: 0.8 mg/dL (ref 0.44–1.00)
GFR, Estimated: >60 mL/min (ref >60)
Glucose, Bld: 109 mg/dL — ABNORMAL HIGH (ref 70–99)
Potassium: 3.8 mmol/L (ref 3.5–5.1)
Sodium: 137 mmol/L (ref 135–145)

## 2021-09-25 LAB — CBC WITH DIFFERENTIAL/PLATELET
Abs Immature Granulocytes: 0 10*3/uL (ref 0.00–0.07)
Basophils Absolute: 0 10*3/uL (ref 0.0–0.1)
Basophils Relative: 0 %
Eosinophils Absolute: 0.2 10*3/uL (ref 0.0–0.5)
Eosinophils Relative: 1 %
HCT: 34.6 % — ABNORMAL LOW (ref 36.0–46.0)
Hemoglobin: 10.4 g/dL — ABNORMAL LOW (ref 12.0–15.0)
Lymphocytes Relative: 27 %
Lymphs Abs: 5.6 10*3/uL — ABNORMAL HIGH (ref 0.7–4.0)
MCH: 26.5 pg (ref 26.0–34.0)
MCHC: 30.1 g/dL (ref 30.0–36.0)
MCV: 88 fL (ref 80.0–100.0)
Monocytes Absolute: 2.1 10*3/uL — ABNORMAL HIGH (ref 0.1–1.0)
Monocytes Relative: 10 %
Neutro Abs: 13 10*3/uL — ABNORMAL HIGH (ref 1.7–7.7)
Neutrophils Relative %: 62 %
Platelets: 372 10*3/uL (ref 150–400)
RBC: 3.93 MIL/uL (ref 3.87–5.11)
RDW: 15.6 % — ABNORMAL HIGH (ref 11.5–15.5)
WBC: 20.9 10*3/uL — ABNORMAL HIGH (ref 4.0–10.5)
nRBC: 0 % (ref 0.0–0.2)
nRBC: 0 /100 WBC

## 2021-09-25 LAB — GLUCOSE, CAPILLARY
Glucose-Capillary: 202 mg/dL — ABNORMAL HIGH (ref 70–99)
Glucose-Capillary: 134 mg/dL — ABNORMAL HIGH (ref 70–99)
Glucose-Capillary: 92 mg/dL (ref 70–99)
Glucose-Capillary: 226 mg/dL — ABNORMAL HIGH (ref 70–99)
Glucose-Capillary: 134 mg/dL — ABNORMAL HIGH (ref 70–99)

## 2021-09-25 MED ORDER — LOSARTAN POTASSIUM 50 MG PO TABS
100.0000 mg | ORAL_TABLET | Freq: Every day | ORAL | Status: DC
  Administered 2021-09-26: 08:00:00 100 mg via ORAL
  Filled 2021-09-25: qty 2

## 2021-09-25 MED ORDER — GABAPENTIN 300 MG PO CAPS
300.0000 mg | ORAL_CAPSULE | Freq: Every day | ORAL | Status: DC
  Administered 2021-09-25: 21:00:00 300 mg via ORAL
  Filled 2021-09-25: qty 1

## 2021-09-25 NOTE — Progress Notes (Signed)
Mobility Specialist Progress Note:   09/25/21 1200  Mobility  Activity Ambulated in hall  Level of Assistance Independent  Assistive Device None  Distance Ambulated (ft) 200 ft  Mobility Ambulated independently in hallway  Mobility Response Tolerated well  Mobility performed by Mobility specialist  $Mobility charge 1 Mobility    Pre Mobility: Spo2 99% During Mobility: SpO2 99% Post Mobility: SpO2 97%  Pt with significant SOB/wheezing during ambulation. SpO2 stayed at or above 97% on 3LO2 throughout session. Pt back sitting EOB with all needs met.   Nelta Numbers Mobility Specialist  Phone [phone]

## 2021-09-25 NOTE — Progress Notes (Signed)
Mobility Specialist Progress Note:   09/25/21 1500  Mobility  Activity Ambulated in hall  Level of Assistance Independent  Assistive Device None  Distance Ambulated (ft) 270 ft  Mobility Ambulated independently in hallway  Mobility Response Tolerated well  Mobility performed by Mobility specialist  $Mobility charge 1 Mobility   Pre Mobility: SpO2 95% During Mobility: SpO2 93% Post Mobility: SpO2 95%  Pt with SOB during ambulation. SpO2 at or above 93% throughout session. Pt required x1 seated rest break d/t fatigue/SOB. Pt back in bed with all needs met.   Nelta Numbers Mobility Specialist  Phone [phone]

## 2021-09-25 NOTE — Plan of Care (Signed)
  Problem: Activity: Goal: Risk for activity intolerance will decrease Outcome: Progressing   Problem: Coping: Goal: Level of anxiety will decrease Outcome: Progressing   Problem: Elimination: Goal: Will not experience complications related to bowel motility Outcome: Progressing   

## 2021-09-25 NOTE — Progress Notes (Signed)
PROGRESS NOTE  [name] BEM:[id number] DOB: 02-09-1971 DOA: 09/18/2021 PCP: Ladell Pier, MD  Brief History   Hannah Baldwin is a 50 y.o. female with medical history significant of asthma, non-insulin-dependent type II diabetes, depression, morbid obesity presenting to the ED complaining of cough, shortness of breath, and wheezing.  On arrival to the ED, she was in respiratory distress.  Wheezing and tachypneic.  Not hypoxic.  She was treated with Solu-Medrol 125 mg, IV magnesium 2 g, and DuoNeb x3.  Not febrile.  Labs showing WBC 15.0.  Hemoglobin 10.5, no significant change from baseline.  Potassium 3.1.  COVID and flu negative.  Chest x-ray negative for acute finding.   Patient reports shortness of breath, cough, wheezing, and chest tightness.  Symptoms started 4 days ago and getting progressively worse.  She is using all of her home inhalers and also her nebulizer but not improving.  States she feels very short of breath when walking and even at rest when talking.  She stopped smoking cigarettes 6 months ago.  The patient continues to complain of cough and pleuritic chest pain. She is currently requiring 4 L to maintain saturations of 100%. Patient is still reticent to try to wean down O2. I have encouraged her to remain calm when O2 is reduced.  She is seen walking down the hall with PT today. She is able to maintain a saturation of 97% on 3L. She is quite dyspneic with activity though.  Patient is also complaining of neck pain. I have ordered a heating pad. Consultants  None  Procedures  None  Antibiotics   Anti-infectives (From admission, onward)    Start     Dose/Rate Route Frequency Ordered Stop   09/21/21 1000  azithromycin (ZITHROMAX) tablet 250 mg       See Hyperspace for full Linked Orders Report.   250 mg Oral Daily 09/20/21 1603 09/24/21 0931   09/20/21 1700  azithromycin (ZITHROMAX) tablet 500 mg       See Hyperspace for full Linked Orders Report.   500 mg  Oral Daily 09/20/21 1603 09/20/21 1800       Subjective  Pt is examined after walking in the halls on 3L. She is very dyspneic. Nursing has related that the patient is complaining of neck pain. Heating pad ordered.  Objective   Vitals:  Vitals:   09/25/21 0815 09/25/21 1521  BP:    Pulse:    Resp:    Temp:    SpO2: 99% 100%    Exam:  Constitutional:  The patient is awake, alert, and oriented x 3. No acute distress. Respiratory:  No increased work of breathing. No rales, or rhonchi Diffuse wheezes throughout No tactile fremitus Cardiovascular:  Regular rate and rhythm No murmurs, ectopy, or gallups. No lateral PMI. No thrills. Abdomen:  Abdomen is soft, non-tender, non-distended No hernias, masses, or organomegaly Normoactive bowel sounds.  Musculoskeletal:  No cyanosis, clubbing, or edema Skin:  No rashes, lesions, ulcers palpation of skin: no induration or nodules Neurologic:  CN 2-12 intact Sensation all 4 extremities intact Psychiatric:  Mental status Mood, affect appropriate Orientation to person, place, time  judgment and insight appear intact  I have personally reviewed the following:   Today's Data  vitals  Lab Data  CBC, CMP  Micro Data    Imaging  CXR  Cardiology Data    Other Data    Scheduled Meds:  albuterol  2.5 mg Nebulization Q4H   arformoterol  15  mcg Nebulization BID   budesonide (PULMICORT) nebulizer solution  0.5 mg Nebulization BID   dextromethorphan-guaiFENesin  2 tablet Oral BID   DULoxetine  60 mg Oral Daily   enoxaparin (LOVENOX) injection  65 mg Subcutaneous Q24H   fluticasone  2 spray Each Nare Daily   gabapentin  300 mg Oral QHS   hydrochlorothiazide  25 mg Oral Daily   losartan  50 mg Oral Daily   megestrol  40 mg Oral BID   metFORMIN  500 mg Oral BID WC   methylPREDNISolone (SOLU-MEDROL) injection  60 mg Intravenous Daily   montelukast  10 mg Oral QHS   pantoprazole  40 mg Oral Q1200    Principal  Problem:   Asthma exacerbation Active Problems:   Diabetes type 2, controlled ([place])   Essential hypertension   Hypokalemia    LOS: 6 days   A & P  Acute asthma exacerbation Patient complaining of progressively worsening dyspnea, cough, wheezing, and chest tightness x4 days.  She was in respiratory distress on arrival to the ED and was given Solu-Medrol 125 mg, IV magnesium 2 g, and DuoNeb x3.  Chest x-ray negative for acute finding.  COVID and flu negative.  EKG without acute ischemic changes.  Still wheezing but work of breathing has improved.   -She is currently requiring 5L O2 by nasal cannula to maintain saturations of 98%.  -IV Solu-Medrol 60 daily, DuoNeb every 6 hours, albuterol neb every 2 hours as needed, Pulmicort neb twice daily, Mucinex DM.  Continuous pulse ox, supplemental oxygen as needed to keep oxygen saturation above 92%. PCCM consulted. I appreciate their assistance.  Acute Hypoxic Respiratory Failure Today the Pt is saturating 97% on 3L with ambulation. Dyspea is felt to be due to deconditioning. I have encouraged her to try reassuring her that we are watching her SaO2 and that we will turn it back up if we have to. However, it is quite possible that the patient will need to discharge on O2.   Mild hypokalemia -Resolved. -Supplement and monitor.   Leukocytosis Likely reactive and in response to steroids.  Not febrile and chest x-ray not suggestive of pneumonia.  Not endorsing any other infectious symptoms. -Repeat CBC in a.m.   Non-insulin-dependent type 2 diabetes Appears to be well controlled - A1c 6.1  on 09/18/2021.  Metformin listed in home medications but patient tells me she is not taking it.  She is requesting regular diet.  -Sensitive sliding scale insulin ACHS   Depression -Continue duloxetine   Hypertension Pt with high BP. Even when systolic is relatively controlled the diastolic is in the 256'L. Will add hydrochlorothiazide. Suspect metabolic  syndrome in this morbidly obese woman with DM and HTN on Megace for DUB. -Continue lisinopril.  Add HCTZ.   Morbid obesity (BMI 53.50) -Encourage lifestyle modifications such as healthy diet and exercise. Use of megace for DUB in this morbidly obese woman with hypertension and DM II is not helpful here. Understand that it is important to control of DUB without a procedure such as endometrial ablation, but it should be discontinued as soon as another modality of controlling DUB is possible.  DUB:  Patient has been on Megace for 2 years for this indication. While it is effective in controlling her debilitating DUB, it is contributing to the patient's severe morbid obesity, hypertension, and DM II - metabolic syndrome. She is working on getting Medicaid coverage. Hopefully this will put a procedure such as endometrial ablation within her grasp financially so  Megace can be stopped. Will check lipid panel.  I have seen and examined this patient myself. I have spent 32 minutes in her evaluation and care.   DVT prophylaxis: Lovenox Code Status: Full code Family Communication: No family at bedside. Disposition Plan: Status is: Inpatient. Disposition will be to home.   Level of care: Level of care: Telemetry Medical    Brooklyne Radke, DO [facility]   If 7PM-7AM, please contact night-coverage [email or web address]  Yasmeen Manka, DO [facility] Direct contact: see [email or web address]  7PM-7AM contact night coverage as above 09/25/2021, 5:51 PM  LOS: 0 days

## 2021-09-26 ENCOUNTER — Other Ambulatory Visit (HOSPITAL_COMMUNITY): Payer: Self-pay

## 2021-09-26 LAB — GLUCOSE, CAPILLARY: Glucose-Capillary: 167 mg/dL — ABNORMAL HIGH (ref 70–99)

## 2021-09-26 MED ORDER — LOSARTAN POTASSIUM 100 MG PO TABS
100.0000 mg | ORAL_TABLET | Freq: Every day | ORAL | 3 refills | Status: DC
  Filled 2021-09-26: qty 30, 30d supply, fill #0
  Filled 2022-08-22: qty 30, 30d supply, fill #1

## 2021-09-26 MED ORDER — ALBUTEROL SULFATE (2.5 MG/3ML) 0.083% IN NEBU: 2.5000 mg | INHALATION_SOLUTION | Freq: Four times a day (QID) | RESPIRATORY_TRACT | Status: DC

## 2021-09-26 MED ORDER — PANTOPRAZOLE SODIUM 40 MG PO TBEC
40.0000 mg | DELAYED_RELEASE_TABLET | Freq: Every day | ORAL | 3 refills | Status: DC
  Filled 2021-09-26: qty 30, 30d supply, fill #0
  Filled 2022-08-22: qty 30, 30d supply, fill #1

## 2021-09-26 MED ORDER — ALBUTEROL SULFATE HFA 108 (90 BASE) MCG/ACT IN AERS
2.0000 | INHALATION_SPRAY | RESPIRATORY_TRACT | 2 refills | Status: DC | PRN
  Filled 2021-09-26: qty 18, 18d supply, fill #0
  Filled 2022-05-30: qty 18, 25d supply, fill #1
  Filled 2022-06-04: qty 6.7, 25d supply, fill #1

## 2021-09-26 MED ORDER — PREDNISONE 50 MG PO TABS
50.0000 mg | ORAL_TABLET | Freq: Every day | ORAL | 0 refills | Status: AC
  Filled 2021-09-26: qty 7, 7d supply, fill #0

## 2021-09-26 MED ORDER — HYDROCHLOROTHIAZIDE 25 MG PO TABS
25.0000 mg | ORAL_TABLET | Freq: Every day | ORAL | 3 refills | Status: DC
  Filled 2021-09-26: qty 30, 30d supply, fill #0
  Filled 2022-08-22: qty 30, 30d supply, fill #1

## 2021-09-26 NOTE — Progress Notes (Addendum)
Mobility Specialist Progress Note:   09/26/21 1115  Mobility  Activity Ambulated in hall  Level of Assistance Independent  Assistive Device None  Distance Ambulated (ft) 200 ft  Mobility Ambulated independently in hallway  Mobility Response Tolerated fair  Mobility performed by Mobility specialist  $Mobility charge 1 Mobility    SATURATION QUALIFICATIONS: (This note is used to comply with regulatory documentation for home oxygen)  Patient Saturations on Room Air at Rest = 93%  Patient Saturations on Room Air while Ambulating = 87%  Pt with SOB during ambulation. Distance limited secondary to fatigue. Required x1 seated rest break to recover SpO2 to 93%. Pt back sitting EOB with all needs met.   Nelta Numbers Mobility Specialist  Phone [phone]

## 2021-09-26 NOTE — TOC Transition Note (Addendum)
Transition of Care [facility]) - CM/SW Discharge Note   Patient Details  Name: EDLA PARA MRN: [id number] Date of Birth: 04-May-1971  Transition of Care [facility] CM/SW Contact:  Angelita Ingles, RN Phone Number:[phone]  09/26/2021, 11:57 AM   Clinical Narrative:    Home O2 has been ordered through [employer] and to be delivered to the bedside. No other needs noted at this time. TOC will continue to follow.   [place] for completed for uninsured patient to provide 30 day supply of home meds prior to discharge.   1315 Nurse called to verify that oxygen would be delivered to the room prior to d/c. CM confirmed that O2 will be delivered to the room.    Final next level of care: Home/Self Care Barriers to Discharge: No Barriers Identified   Patient Goals and CMS Choice Patient states their goals for this hospitalization and ongoing recovery are:: to return to home CMS Medicare.gov Compare Post Acute Care list provided to:: Patient    Discharge Placement                       Discharge Plan and Services   Discharge Planning Services: CM Consult            DME Arranged: Oxygen DME Agency: [employer] Date DME Agency Contacted: 09/26/21 Time DME Agency Contacted: 1156 Representative spoke with at DME Agency: [place]: NA [place] Agency: NA        Social Determinants of Health ([place]) Interventions     Readmission Risk Interventions No flowsheet data found.

## 2021-09-26 NOTE — Progress Notes (Signed)
RN gave patient discharge instructions and the patient stated understanding. IV has been removed and the patient is dressed waiting on her ride

## 2021-09-26 NOTE — Discharge Summary (Signed)
Physician Discharge Summary   Hannah Baldwin XYV:[id number] DOB: August 10, 1971 DOA: 09/18/2021  PCP: Ladell Pier, MD  Admit date: 09/18/2021 Discharge date: 09/26/2021   Admitted From: home Disposition:  home Discharging physician: Dwyane Dee, MD  Recommendations for Outpatient Follow-up:  Wean oxygen off as able  Home Health:  Equipment/Devices:   Discharge Condition: stable CODE STATUS: full Diet recommendation:  Diet Orders (From admission, onward)     Start     Ordered   09/26/21 0000  Diet general        09/26/21 1140   09/19/21 0418  Diet regular Room service appropriate? Yes; Fluid consistency: Thin  Diet effective now       Question Answer Comment  Room service appropriate? Yes   Fluid consistency: Thin      09/19/21 0418            Hospital Course:  Acute asthma exacerbation Patient complaining of progressively worsening dyspnea, cough, wheezing, and chest tightness x4 days.  She was in respiratory distress on arrival to the ED and was given Solu-Medrol 125 mg, IV magnesium 2 g, and DuoNeb x3.  Chest x-ray negative for acute finding.  COVID and flu negative.   - RVP positive for rhinovirus - patient still smoking as well; heavily counseled on cessation - continue nebs and inhaler and d/c - continue prednisone course   Acute Hypoxic Respiratory Failure -Smoking cessation counseling performed - Mostly desats when up and ambulating with exertion.  Down to 87% while ambulating on room air on the day of discharge therefore will arrange for home oxygen for further outpatient weaning   Mild hypokalemia -Resolved.  Supplemented   Leukocytosis Likely reactive and in response to steroids.    Non-insulin-dependent type 2 diabetes Appears to be well controlled - A1c 6.1  on 09/18/2021.    Depression -Continue duloxetine   Hypertension Pt with high BP. Even when systolic is relatively controlled the diastolic is in the 286'N. Will add  hydrochlorothiazide. Suspect metabolic syndrome in this morbidly obese woman with DM and HTN on Megace for DUB. -Continue lisinopril.  Add HCTZ.   Morbid obesity (BMI 53.50) -Encourage lifestyle modifications such as healthy diet and exercise. Use of megace for DUB in this morbidly obese woman with hypertension and DM II is not helpful here. Understand that it is important to control of DUB without a procedure such as endometrial ablation, but it should be discontinued as soon as another modality of controlling DUB is possible.   DUB:  Patient has been on Megace for 2 years for this indication. While it is effective in controlling her debilitating DUB, it is contributing to the patient's severe morbid obesity, hypertension, and DM II - metabolic syndrome. She is working on getting Medicaid coverage. Hopefully this will put a procedure such as endometrial ablation within her grasp financially so Megace can be stopped.    The patient's chronic medical conditions were treated accordingly per the patient's home medication regimen except as noted.  On day of discharge, patient was felt deemed stable for discharge. Patient/family member advised to call PCP or come back to ER if needed.   Principal Diagnosis: Asthma exacerbation  Discharge Diagnoses: Principal Problem:   Asthma exacerbation Active Problems:   Diabetes type 2, controlled ([place])   Essential hypertension   Hypokalemia   Discharge Instructions     Diet general   Complete by: As directed    Increase activity slowly   Complete by: As directed  Allergies as of 09/26/2021   No Known Allergies      Medication List     STOP taking these medications    lisinopril 10 MG tablet Commonly known as: ZESTRIL   nystatin-triamcinolone cream Commonly known as: MYCOLOG II       TAKE these medications    acetaminophen 500 MG tablet Commonly known as: TYLENOL Take 1,000 mg by mouth every 6 (six) hours as needed for  moderate pain or headache.   albuterol (2.5 MG/3ML) 0.083% nebulizer solution Commonly known as: PROVENTIL Take 3 mLs (2.5 mg total) by nebulization every 6 (six) hours as needed for wheezing or shortness of breath. What changed: Another medication with the same name was changed. Make sure you understand how and when to take each.   albuterol 108 (90 Base) MCG/ACT inhaler Commonly known as: VENTOLIN HFA Inhale 2 puffs into the lungs every 4 (four) hours as needed for wheezing or shortness of breath. What changed: when to take this   blood glucose meter kit and supplies Kit Dispense based on patient and insurance preference. Use up to four times daily as directed. (FOR ICD-9 250.00, 250.01).   Dulera 100-5 MCG/ACT Aero Generic drug: mometasone-formoterol Inhale 2 puffs into the lungs 2 (two) times daily.   DULoxetine 60 MG capsule Commonly known as: Cymbalta Take 1 capsule (60 mg total) by mouth daily.   fluconazole 150 MG tablet Commonly known as: DIFLUCAN Take one tablet by mouth once monthly What changed:  how much to take how to take this when to take this additional instructions   glucose blood test strip Commonly known as: True Metrix Blood Glucose Test Use as instructed   hydrochlorothiazide 25 MG tablet Commonly known as: HYDRODIURIL Take 1 tablet (25 mg total) by mouth daily. Start taking on: September 27, 2021   HYDROcodone-acetaminophen 5-325 MG tablet Commonly known as: NORCO/VICODIN Take 1 tablet by mouth every 8 (eight) hours as needed for moderate pain.   lidocaine 5 % Commonly known as: Lidoderm Place 1 patch onto the skin daily as needed. Apply patch to area most significant pain once per day.  Remove and discard patch within 12 hours of application. What changed:  reasons to take this additional instructions   lidocaine 5 % Commonly known as: LIDODERM place 1 patch onto the skin daily as needed. (remove and  discard patch within 12 hours of  application) What changed: Another medication with the same name was changed. Make sure you understand how and when to take each.   losartan 100 MG tablet Commonly known as: COZAAR Take 1 tablet (100 mg total) by mouth daily. Start taking on: September 27, 2021   megestrol 40 MG tablet Commonly known as: MEGACE TAKE 1 TABLET (40 MG TOTAL) BY MOUTH 2 (TWO) TIMES DAILY. What changed: how much to take   meloxicam 15 MG tablet Commonly known as: MOBIC TAKE 1 TABLET (15 MG TOTAL) BY MOUTH DAILY. What changed: how much to take   metFORMIN 500 MG tablet Commonly known as: GLUCOPHAGE Take 1 tablet (500 mg total) by mouth 2 (two) times daily.   methocarbamol 500 MG tablet Commonly known as: ROBAXIN Take 2 tablets (1,000 mg total) by mouth every 8 (eight) hours as needed for muscle spasms.   pantoprazole 40 MG tablet Commonly known as: PROTONIX Take 1 tablet (40 mg total) by mouth daily at 12 noon.   predniSONE 50 MG tablet Commonly known as: DELTASONE Take 1 tablet (50 mg total) by mouth daily  with breakfast for 7 days.   simethicone 80 MG chewable tablet Commonly known as: MYLICON Chew 04-888 mg by mouth every 6 (six) hours as needed for flatulence.   traZODone 100 MG tablet Commonly known as: DESYREL Take 1 tablet (100 mg total) by mouth at bedtime as needed for sleep. What changed: when to take this   True Metrix Meter w/Device Kit USE AS INSTRUCTED   TRUEplus Lancets 28G Misc Check blood sugar TID & QHS               Durable Medical Equipment  (From admission, onward)           Start     Ordered   09/26/21 1146  For home use only DME oxygen  Once       Question Answer Comment  Length of Need 6 Months   Mode or (Route) Nasal cannula   Liters per Minute 2   Frequency Continuous (stationary and portable oxygen unit needed)   Oxygen conserving device Yes   Oxygen delivery system Gas      09/26/21 1145            Follow-up Information      Ladell Pier, MD Follow up.   Specialty: Internal Medicine Why: October 10, 2021 at 4:10 pm Contact information: [address] [place] [address] [phone]                No Known Allergies  Consultations:   Discharge Exam: BP (!) 159/91 (BP Location: Left Arm)    Pulse 84    Temp 98.2 F (36.8 C) (Oral)    Resp 20    Ht '5\' 3"'  (1.6 m)    Wt (!) 137 kg    SpO2 100%    BMI 53.50 kg/m  Physical Exam Constitutional:      Comments: Pleasant obese woman sitting on edge of bed in no distress  HENT:     Head: Normocephalic and atraumatic.     Mouth/Throat:     Mouth: Mucous membranes are moist.  Eyes:     Extraocular Movements: Extraocular movements intact.  Cardiovascular:     Rate and Rhythm: Normal rate and regular rhythm.  Pulmonary:     Comments: Diffuse coarse breath sounds bilaterally.  No wheezing Abdominal:     General: Bowel sounds are normal. There is no distension.     Palpations: Abdomen is soft.     Tenderness: There is no abdominal tenderness.  Musculoskeletal:        General: Normal range of motion.     Cervical back: Normal range of motion and neck supple.  Skin:    General: Skin is warm and dry.  Neurological:     General: No focal deficit present.  Psychiatric:        Mood and Affect: Mood normal.        Behavior: Behavior normal.     The results of significant diagnostics from this hospitalization (including imaging, microbiology, ancillary and laboratory) are listed below for reference.   Microbiology: Recent Results (from the past 240 hour(s))  Resp Panel by RT-PCR (Flu A&B, Covid) Nasopharyngeal Swab     Status: None   Collection Time: 09/19/21 12:32 AM   Specimen: Nasopharyngeal Swab; Nasopharyngeal(NP) swabs in vial transport medium  Result Value Ref Range Status   SARS Coronavirus 2 by RT PCR NEGATIVE NEGATIVE Final    Comment: (NOTE) SARS-CoV-2 target nucleic acids are NOT DETECTED.  The SARS-CoV-2 RNA  is generally  detectable in upper respiratory specimens during the acute phase of infection. The lowest concentration of SARS-CoV-2 viral copies this assay can detect is 138 copies/mL. A negative result does not preclude SARS-Cov-2 infection and should not be used as the sole basis for treatment or other patient management decisions. A negative result may occur with  improper specimen collection/handling, submission of specimen other than nasopharyngeal swab, presence of viral mutation(s) within the areas targeted by this assay, and inadequate number of viral copies(<138 copies/mL). A negative result must be combined with clinical observations, patient history, and epidemiological information. The expected result is Negative.  Fact Sheet for Patients:  EntrepreneurPulse.com.au  Fact Sheet for Healthcare Providers:  IncredibleEmployment.be  This test is no t yet approved or cleared by the [place] FDA and  has been authorized for detection and/or diagnosis of SARS-CoV-2 by FDA under an Emergency Use Authorization (EUA). This EUA will remain  in effect (meaning this test can be used) for the duration of the COVID-19 declaration under Section 564(b)(1) of the Act, 21 U.S.C.section 360bbb-3(b)(1), unless the authorization is terminated  or revoked sooner.       Influenza A by PCR NEGATIVE NEGATIVE Final   Influenza B by PCR NEGATIVE NEGATIVE Final    Comment: (NOTE) The Xpert Xpress SARS-CoV-2/FLU/RSV plus assay is intended as an aid in the diagnosis of influenza from Nasopharyngeal swab specimens and should not be used as a sole basis for treatment. Nasal washings and aspirates are unacceptable for Xpert Xpress SARS-CoV-2/FLU/RSV testing.  Fact Sheet for Patients: EntrepreneurPulse.com.au  Fact Sheet for Healthcare Providers: IncredibleEmployment.be  This test is not yet approved or cleared by the [place] FDA  and has been authorized for detection and/or diagnosis of SARS-CoV-2 by FDA under an Emergency Use Authorization (EUA). This EUA will remain in effect (meaning this test can be used) for the duration of the COVID-19 declaration under Section 564(b)(1) of the Act, 21 U.S.C. section 360bbb-3(b)(1), unless the authorization is terminated or revoked.  Performed at [facility], [place] [address] [place], [place] [address]   Respiratory (~20 pathogens) panel by PCR     Status: Abnormal   Collection Time: 09/19/21 12:32 AM   Specimen: Nasopharyngeal Swab; Respiratory  Result Value Ref Range Status   Adenovirus NOT DETECTED NOT DETECTED Final   Coronavirus 229E NOT DETECTED NOT DETECTED Final    Comment: (NOTE) The Coronavirus on the Respiratory Panel, DOES NOT test for the novel  Coronavirus (2019 nCoV)    Coronavirus HKU1 NOT DETECTED NOT DETECTED Final   Coronavirus NL63 NOT DETECTED NOT DETECTED Final   Coronavirus OC43 NOT DETECTED NOT DETECTED Final   Metapneumovirus NOT DETECTED NOT DETECTED Final   Rhinovirus / Enterovirus DETECTED (A) NOT DETECTED Final   Influenza A NOT DETECTED NOT DETECTED Final   Influenza B NOT DETECTED NOT DETECTED Final   Parainfluenza Virus 1 NOT DETECTED NOT DETECTED Final   Parainfluenza Virus 2 NOT DETECTED NOT DETECTED Final   Parainfluenza Virus 3 NOT DETECTED NOT DETECTED Final   Parainfluenza Virus 4 NOT DETECTED NOT DETECTED Final   Respiratory Syncytial Virus NOT DETECTED NOT DETECTED Final   Bordetella pertussis NOT DETECTED NOT DETECTED Final   Bordetella Parapertussis NOT DETECTED NOT DETECTED Final   Chlamydophila pneumoniae NOT DETECTED NOT DETECTED Final   Mycoplasma pneumoniae NOT DETECTED NOT DETECTED Final    Comment: Performed at [facility], [address], [place], [place] [address]  Labs: BNP (last 3 results) No results for input(s): BNP in the last 8760 hours. Basic Metabolic Panel: Recent Labs  Lab  09/20/21 0411 09/21/21 0332 09/22/21 1028 09/25/21 0041  NA 138 137 138 137  K 4.9 4.4 3.8 3.8  CL 104 103 103 101  CO2 '28 25 31 31  ' GLUCOSE 143* 159* 125* 109*  BUN 16 26* 22* 16  CREATININE 0.85 1.29* 1.04* 0.80  CALCIUM 9.2 9.2 8.8* 8.5*   Liver Function Tests: Recent Labs  Lab 09/21/21 0332  AST 54*  ALT 31  ALKPHOS 92  BILITOT 0.1*  PROT 6.8  ALBUMIN 3.0*   No results for input(s): LIPASE, AMYLASE in the last 168 hours. No results for input(s): AMMONIA in the last 168 hours. CBC: Recent Labs  Lab 09/20/21 0411 09/21/21 0332 09/22/21 1028 09/25/21 0041  WBC 20.9* 23.1* 17.1* 20.9*  NEUTROABS 18.3* 18.4* 11.3* 13.0*  HGB 11.1* 11.5* 11.2* 10.4*  HCT 36.1 37.9 38.0 34.6*  MCV 85.3 85.4 86.6 88.0  PLT 439* 489* 415* 372   Cardiac Enzymes: No results for input(s): CKTOTAL, CKMB, CKMBINDEX, TROPONINI in the last 168 hours. BNP: Invalid input(s): POCBNP CBG: Recent Labs  Lab 09/25/21 0802 09/25/21 1151 09/25/21 1617 09/25/21 2106 09/26/21 1235  GLUCAP 134* 92 226* 134* 167*   D-Dimer No results for input(s): DDIMER in the last 72 hours. Hgb A1c No results for input(s): HGBA1C in the last 72 hours. Lipid Profile No results for input(s): CHOL, HDL, LDLCALC, TRIG, CHOLHDL, LDLDIRECT in the last 72 hours. Thyroid function studies No results for input(s): TSH, T4TOTAL, T3FREE, THYROIDAB in the last 72 hours.  Invalid input(s): FREET3 Anemia work up No results for input(s): VITAMINB12, FOLATE, FERRITIN, TIBC, IRON, RETICCTPCT in the last 72 hours. Urinalysis    Component Value Date/Time   COLORURINE YELLOW 03/31/2016 1515   APPEARANCEUR HAZY (A) 03/31/2016 1515   LABSPEC >1.030 (H) 03/31/2016 1515   PHURINE 6.0 03/31/2016 1515   GLUCOSEU NEGATIVE 03/31/2016 1515   HGBUR LARGE (A) 03/31/2016 1515   BILIRUBINUR small (A) 03/12/2019 1126   KETONESUR negative 03/12/2019 1126   KETONESUR NEGATIVE 03/31/2016 1515   PROTEINUR NEGATIVE 03/31/2016 1515    UROBILINOGEN 1.0 03/12/2019 1126   UROBILINOGEN 1.0 12/08/2015 1440   NITRITE Negative 03/12/2019 1126   NITRITE NEGATIVE 03/31/2016 1515   LEUKOCYTESUR Negative 03/12/2019 1126   Sepsis Labs Invalid input(s): PROCALCITONIN,  WBC,  LACTICIDVEN Microbiology Recent Results (from the past 240 hour(s))  Resp Panel by RT-PCR (Flu A&B, Covid) Nasopharyngeal Swab     Status: None   Collection Time: 09/19/21 12:32 AM   Specimen: Nasopharyngeal Swab; Nasopharyngeal(NP) swabs in vial transport medium  Result Value Ref Range Status   SARS Coronavirus 2 by RT PCR NEGATIVE NEGATIVE Final    Comment: (NOTE) SARS-CoV-2 target nucleic acids are NOT DETECTED.  The SARS-CoV-2 RNA is generally detectable in upper respiratory specimens during the acute phase of infection. The lowest concentration of SARS-CoV-2 viral copies this assay can detect is 138 copies/mL. A negative result does not preclude SARS-Cov-2 infection and should not be used as the sole basis for treatment or other patient management decisions. A negative result may occur with  improper specimen collection/handling, submission of specimen other than nasopharyngeal swab, presence of viral mutation(s) within the areas targeted by this assay, and inadequate number of viral copies(<138 copies/mL). A negative result must be combined with clinical observations, patient history, and epidemiological information. The expected result is Negative.  Fact Sheet for  Patients:  EntrepreneurPulse.com.au  Fact Sheet for Healthcare Providers:  IncredibleEmployment.be  This test is no t yet approved or cleared by the [place] FDA and  has been authorized for detection and/or diagnosis of SARS-CoV-2 by FDA under an Emergency Use Authorization (EUA). This EUA will remain  in effect (meaning this test can be used) for the duration of the COVID-19 declaration under Section 564(b)(1) of the Act, 21 U.S.C.section  360bbb-3(b)(1), unless the authorization is terminated  or revoked sooner.       Influenza A by PCR NEGATIVE NEGATIVE Final   Influenza B by PCR NEGATIVE NEGATIVE Final    Comment: (NOTE) The Xpert Xpress SARS-CoV-2/FLU/RSV plus assay is intended as an aid in the diagnosis of influenza from Nasopharyngeal swab specimens and should not be used as a sole basis for treatment. Nasal washings and aspirates are unacceptable for Xpert Xpress SARS-CoV-2/FLU/RSV testing.  Fact Sheet for Patients: EntrepreneurPulse.com.au  Fact Sheet for Healthcare Providers: IncredibleEmployment.be  This test is not yet approved or cleared by the [place] FDA and has been authorized for detection and/or diagnosis of SARS-CoV-2 by FDA under an Emergency Use Authorization (EUA). This EUA will remain in effect (meaning this test can be used) for the duration of the COVID-19 declaration under Section 564(b)(1) of the Act, 21 U.S.C. section 360bbb-3(b)(1), unless the authorization is terminated or revoked.  Performed at [facility], [place] [address], [place], [place] [address]   Respiratory (~20 pathogens) panel by PCR     Status: Abnormal   Collection Time: 09/19/21 12:32 AM   Specimen: Nasopharyngeal Swab; Respiratory  Result Value Ref Range Status   Adenovirus NOT DETECTED NOT DETECTED Final   Coronavirus 229E NOT DETECTED NOT DETECTED Final    Comment: (NOTE) The Coronavirus on the Respiratory Panel, DOES NOT test for the novel  Coronavirus (2019 nCoV)    Coronavirus HKU1 NOT DETECTED NOT DETECTED Final   Coronavirus NL63 NOT DETECTED NOT DETECTED Final   Coronavirus OC43 NOT DETECTED NOT DETECTED Final   Metapneumovirus NOT DETECTED NOT DETECTED Final   Rhinovirus / Enterovirus DETECTED (A) NOT DETECTED Final   Influenza A NOT DETECTED NOT DETECTED Final   Influenza B NOT DETECTED NOT DETECTED Final   Parainfluenza Virus 1 NOT DETECTED NOT DETECTED  Final   Parainfluenza Virus 2 NOT DETECTED NOT DETECTED Final   Parainfluenza Virus 3 NOT DETECTED NOT DETECTED Final   Parainfluenza Virus 4 NOT DETECTED NOT DETECTED Final   Respiratory Syncytial Virus NOT DETECTED NOT DETECTED Final   Bordetella pertussis NOT DETECTED NOT DETECTED Final   Bordetella Parapertussis NOT DETECTED NOT DETECTED Final   Chlamydophila pneumoniae NOT DETECTED NOT DETECTED Final   Mycoplasma pneumoniae NOT DETECTED NOT DETECTED Final    Comment: Performed at [facility], [address], [place], [place] [address]    Procedures/Studies: DG Chest 2 View  Result Date: 09/19/2021 CLINICAL DATA:  Cough, shortness of breath EXAM: CHEST - 2 VIEW COMPARISON:  12/03/2019 FINDINGS: Lungs are clear.  No pleural effusion or pneumothorax. Cardiomegaly. Visualized osseous structures are within normal limits. IMPRESSION: Normal chest radiographs. Electronically Signed   By: Julian Hy M.D.   On: 09/19/2021 01:37   DG CHEST PORT 1 VIEW  Result Date: 09/20/2021 CLINICAL DATA:  Hypoxia, cough. EXAM: PORTABLE CHEST 1 VIEW COMPARISON:  September 19, 2021. FINDINGS: Mild cardiomegaly is noted. Both lungs are clear. The visualized skeletal structures are unremarkable. IMPRESSION: No active disease. Electronically Signed   By:  Marijo Conception M.D.   On: 09/20/2021 14:11     Time coordinating discharge: Over 30 minutes    Dwyane Dee, MD  [facility] 09/26/2021, 5:01 PM

## 2021-09-27 ENCOUNTER — Telehealth: Payer: Self-pay

## 2021-09-27 NOTE — Telephone Encounter (Signed)
Transition Care Management Follow-up Telephone Call Date of discharge and from where: 09/26/2021, [facility]  How have you been since you were released from the hospital? She said she is weak, but okay.  She was driving at the time of this call.  Any questions or concerns? Yes - she would like a POC but is uninsured.   Items Reviewed: Did the pt receive and understand the discharge instructions provided? Yes  Medications obtained and verified? Yes  - she said she has all medications except she needs a refill of the albuterol nebulizer solution and needs to contact the pharmacy for the refill. She didn't have any questions about her med regime.  Other? No  Any new allergies since your discharge? No  Do you have support at home? Yes   Home Care and Equipment/Supplies: Were home health services ordered? no If so, what is the name of the agency? N/a  Has the agency set up a time to come to the patient's home? not applicable Were any new equipment or medical supplies ordered?  Yes: O2 - room concentrator and portable tank on wheels.  What is the name of the medical supply agency? [facility] Were you able to get the supplies/equipment? yes Do you have any questions related to the use of the equipment or supplies? No  She said she has had to put the O2 up to 5L last night but felt okay @ 2L the rest of the day. At the time of this call she was driving and did not have the O2 with her. She said that the tank that she has is too heavy.  She has a nebulizer.   Functional Questionnaire: (I = Independent and D = Dependent) ADLs: independent  Follow up appointments reviewed:  PCP Hospital f/u appt confirmed? Yes  Scheduled to see Dr Margarita Rana  on 10/10/2021. [facility] f/u appt confirmed? Yes  Scheduled to see pulmonary  on 10/16/2021. Are transportation arrangements needed? No  If their condition worsens, is the pt aware to call PCP or go to the Emergency Dept.? Yes Was the patient  provided with contact information for the PCP's office or ED? Yes Was to pt encouraged to call back with questions or concerns? Yes

## 2021-10-10 ENCOUNTER — Other Ambulatory Visit: Payer: Self-pay

## 2021-10-10 ENCOUNTER — Ambulatory Visit: Payer: Self-pay | Attending: Family Medicine | Admitting: Family Medicine

## 2021-10-10 DIAGNOSIS — N938 Other specified abnormal uterine and vaginal bleeding: Secondary | ICD-10-CM

## 2021-10-10 DIAGNOSIS — R600 Localized edema: Secondary | ICD-10-CM

## 2021-10-10 DIAGNOSIS — J4540 Moderate persistent asthma, uncomplicated: Secondary | ICD-10-CM

## 2021-10-10 MED ORDER — FUROSEMIDE 20 MG PO TABS
20.0000 mg | ORAL_TABLET | Freq: Every day | ORAL | 3 refills | Status: DC
Start: 2021-10-10 — End: 2023-05-21
  Filled 2021-10-10: qty 5, 5d supply, fill #0

## 2021-10-10 MED ORDER — BENZONATATE 100 MG PO CAPS
100.0000 mg | ORAL_CAPSULE | Freq: Two times a day (BID) | ORAL | 0 refills | Status: DC | PRN
  Filled 2021-10-10: qty 20, 10d supply, fill #0

## 2021-10-10 MED ORDER — CETIRIZINE HCL 10 MG PO TABS
10.0000 mg | ORAL_TABLET | Freq: Every day | ORAL | 0 refills | Status: AC
Start: 2021-10-10 — End: ?
  Filled 2021-10-10: qty 30, 30d supply, fill #0

## 2021-10-10 NOTE — Progress Notes (Signed)
Virtual Visit via Telephone Note  I connected with Hannah Baldwin, on 10/10/2021 at 4:20 PM by telephone due to the COVID-19 pandemic and verified that I am speaking with the correct person using two identifiers.   Consent: I discussed the limitations, risks, security and privacy concerns of performing an evaluation and management service by telephone and the availability of in person appointments. I also discussed with the patient that there may be a patient responsible charge related to this service. The patient expressed understanding and agreed to proceed.   Location of Patient: Home  Location of Provider: Home office   Persons participating in Telemedicine visit: [name] Dr. Margarita Rana     History of Present Illness: Hannah Baldwin is a 51 y.o. year old female with history of asthma, type 2 diabetes mellitus, hypertension, osteoarthritis, lumbar radiculopathy who is seen today for hospital follow-up. She was hospitalized for asthma exacerbation from 09/18/2021 through 09/26/21. She was treated with Solu-Medrol, nebulizer treatments, magnesium and placed on supplemental oxygen due to respiratory distress.  COVID and flu test were negative.  Respiratory viral panel was positive for rhinovirus. Chest x-ray revealed no active disease.  Today she reports that her wheezing has improved but she is tired all the time.She still has the cough which is dry. Prolonged speaking gets her out of breath. She states in the shower she breathes better. Still on Oxygen 2-3L prn Her skin feels tight in her ankles and she complains of pedal edema Prior to her recent hospitalization she had no asthma flares she states her asthma was stable. Her follow-up appointment with pulmonary comes up on 10/16/2020.  She has questions about megestrol which she has been on for abnormal uterine bleeding she informs me a doctor in the hospital had told her she could have blood clots from this medication.   She has seen GYN in the past for her abnormal uterine bleed and states she was informed she could remain on this medication. Past Medical History:  Diagnosis Date   Anemia    Asthma Dx 2014   Diabetes mellitus without complication (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     albuterol (PROVENTIL) (2.5 MG/3ML) 0.083% nebulizer solution Take 3 mLs (2.5 mg total) by nebulization every 6 (six) hours as needed for wheezing or shortness of breath. 75 mL 12   albuterol (VENTOLIN HFA) 108 (90 Base) MCG/ACT inhaler Inhale 2 puffs into the lungs every 4 (four) hours as needed for wheezing or shortness of breath. 18 g 2   blood glucose meter kit and supplies KIT Dispense based on patient and insurance preference. Use up to four times daily as directed. (FOR ICD-9 250.00, 250.01). 1 each 0   Blood Glucose Monitoring Suppl (TRUE METRIX METER) w/Device KIT USE AS INSTRUCTED 1 kit 0   DULoxetine (CYMBALTA) 60 MG capsule Take 1 capsule (60 mg total) by mouth daily. 30 capsule 4   fluconazole (DIFLUCAN) 150 MG tablet Take one tablet by mouth once monthly (Patient taking differently: Take 150 mg by mouth See admin instructions. Once a month as needed) 1 tablet 6   glucose blood (TRUE  METRIX BLOOD GLUCOSE TEST) test strip Use as instructed 100 each 12   hydrochlorothiazide (HYDRODIURIL) 25 MG tablet Take 1 tablet (25 mg total) by mouth daily. 30 tablet 3   HYDROcodone-acetaminophen (NORCO/VICODIN) 5-325 MG tablet Take 1 tablet by mouth every 8 (eight) hours as needed for moderate pain. 15 tablet 0   lidocaine (LIDODERM) 5 % Place 1 patch onto the skin daily as needed. Apply patch to area most significant pain once per day.  Remove  and discard patch within 12 hours of application. (Patient taking differently: Place 1 patch onto the skin daily as needed (pain).) 30 patch 0   lidocaine (LIDODERM) 5 % place 1 patch onto the skin daily as needed. (remove and  discard patch within 12 hours of application) 30 patch 3   losartan (COZAAR) 100 MG tablet Take 1 tablet (100 mg total) by mouth daily. 30 tablet 3   megestrol (MEGACE) 40 MG tablet TAKE 1 TABLET (40 MG TOTAL) BY MOUTH 2 (TWO) TIMES DAILY. (Patient taking differently: Take 40 mg by mouth 2 (two) times daily.) 60 tablet 4   meloxicam (MOBIC) 15 MG tablet TAKE 1 TABLET (15 MG TOTAL) BY MOUTH DAILY. (Patient taking differently: Take 15 mg by mouth daily.) 30 tablet 2   metFORMIN (GLUCOPHAGE) 500 MG tablet Take 1 tablet (500 mg total) by mouth 2 (two) times daily. 180 tablet 1   methocarbamol (ROBAXIN) 500 MG tablet Take 2 tablets (1,000 mg total) by mouth every 8 (eight) hours as needed for muscle spasms. 90 tablet 1   mometasone-formoterol (DULERA) 100-5 MCG/ACT AERO Inhale 2 puffs into the lungs 2 (two) times daily. 13 g 12   pantoprazole (PROTONIX) 40 MG tablet Take 1 tablet (40 mg total) by mouth daily at 12 noon. 30 tablet 3   simethicone (MYLICON) 80 MG chewable tablet Chew 80-160 mg by mouth every 6 (six) hours as needed for flatulence.     traZODone (DESYREL) 100 MG tablet Take 1 tablet (100 mg total) by mouth at bedtime as needed for sleep. (Patient taking differently: Take 100 mg by mouth at bedtime.) 90 tablet 1   TRUEPLUS LANCETS 28G MISC Check blood sugar TID & QHS 100 each 2   No current facility-administered medications on file prior to visit.    ROS: See HPI  Observations/Objective: Awake, alert, oriented x3 Not in acute distress Normal mood   CMP Latest Ref Rng & Units 09/25/2021 09/22/2021 09/21/2021  Glucose 70 - 99 mg/dL 109(H) 125(H) 159(H)  BUN 6 - 20 mg/dL 16 22(H) 26(H)  Creatinine 0.44 - 1.00 mg/dL 0.80 1.04(H) 1.29(H)  Sodium 135 - 145  mmol/L 137 138 137  Potassium 3.5 - 5.1 mmol/L 3.8 3.8 4.4  Chloride 98 - 111 mmol/L 101 103 103  CO2 22 - 32 mmol/L '31 31 25  ' Calcium 8.9 - 10.3 mg/dL 8.5(L) 8.8(L) 9.2  Total Protein 6.5 - 8.1 g/dL - - 6.8  Total Bilirubin 0.3 - 1.2 mg/dL - - 0.1(L)  Alkaline Phos 38 - 126 U/L - - 92  AST 15 - 41 U/L - - 54(H)  ALT 0 - 44 U/L - - 31    Lipid Panel     Component Value Date/Time   CHOL 140 05/10/2021 1432   TRIG 53 05/10/2021 1432   HDL 49 05/10/2021 1432   CHOLHDL 2.9 05/10/2021 1432   CHOLHDL 3.4 01/20/2015 2241   VLDL 15 01/20/2015 2241   LDLCALC 80 05/10/2021 1432   LABVLDL 11 05/10/2021 1432  Lab Results  Component Value Date   HGBA1C 6.1 (H) 09/19/2021    CBC    Component Value Date/Time   WBC 20.9 (H) 09/25/2021 0041   RBC 3.93 09/25/2021 0041   HGB 10.4 (L) 09/25/2021 0041   HGB 11.1 05/10/2021 1432   HCT 34.6 (L) 09/25/2021 0041   HCT 36.6 05/10/2021 1432   PLT 372 09/25/2021 0041   PLT 371 05/10/2021 1432   MCV 88.0 09/25/2021 0041   MCV 85 05/10/2021 1432   MCH 26.5 09/25/2021 0041   MCHC 30.1 09/25/2021 0041   RDW 15.6 (H) 09/25/2021 0041   RDW 15.3 05/10/2021 1432   LYMPHSABS 5.6 (H) 09/25/2021 0041   LYMPHSABS 2.3 05/10/2021 1432   MONOABS 2.1 (H) 09/25/2021 0041   EOSABS 0.2 09/25/2021 0041   EOSABS 0.2 05/10/2021 1432   BASOSABS 0.0 09/25/2021 0041   BASOSABS 0.0 05/10/2021 1432    Assessment and Plan: 1. Moderate persistent asthma without complication With ongoing cough We will add on Tessalon Perles and Zyrtec Continue Dulera and Proventil Keep follow-up appointment with pulmonary - cetirizine (ZYRTEC) 10 MG tablet; Take 1 tablet (10 mg total) by mouth daily.  Dispense: 30 tablet; Refill: 0 - benzonatate (TESSALON) 100 MG capsule; Take 1 capsule (100 mg total) by mouth 2 (two) times daily as needed for cough.  Dispense: 20 capsule; Refill: 0  2. Pedal edema Likely due to steroid use Placed on short course of prednisone -  furosemide (LASIX) 20 MG tablet; Take 1 tablet (20 mg total) by mouth daily.  Dispense: 5 tablet; Refill: 3  3. DUB (dysfunctional uterine bleeding) Currently on Megace She might need evaluation by GYN for definitive management of abnormal uterine bleed including IUD placement or hysterectomy  4. Acute Respiratory Failure Currently on 2-3L of Oxygen prn Was not on Oxygen prior to hospitalization Monitor and determine when to taper off   Follow Up Instructions: Keep schedule appointment with Pulmonary and PCP   I discussed the assessment and treatment plan with the patient. The patient was provided an opportunity to ask questions and all were answered. The patient agreed with the plan and demonstrated an understanding of the instructions.   The patient was advised to call back or seek an in-person evaluation if the symptoms worsen or if the condition fails to improve as anticipated.     I provided 15 minutes total of non-face-to-face time during this encounter.   Charlott Rakes, MD, FAAFP. [facility] [place], [place]   10/10/2021, 4:20 PM

## 2021-10-16 ENCOUNTER — Other Ambulatory Visit: Payer: Self-pay

## 2021-10-16 ENCOUNTER — Ambulatory Visit (INDEPENDENT_AMBULATORY_CARE_PROVIDER_SITE_OTHER): Payer: Self-pay | Admitting: Pulmonary Disease

## 2021-10-16 ENCOUNTER — Encounter: Payer: Self-pay | Admitting: Pulmonary Disease

## 2021-10-16 VITALS — BP 130/84 | HR 74 | Ht 63.0 in | Wt 312.0 lb

## 2021-10-16 DIAGNOSIS — J4531 Mild persistent asthma with (acute) exacerbation: Secondary | ICD-10-CM

## 2021-10-16 MED ORDER — PREDNISONE 10 MG PO TABS
ORAL_TABLET | ORAL | 0 refills | Status: AC
  Filled 2021-10-16: qty 30, 12d supply, fill #0

## 2021-10-16 NOTE — Progress Notes (Signed)
Synopsis: Referred in January 2022 for hospital follow up due to asthma exacerbation  Subjective:   PATIENT ID: Hannah Baldwin GENDER: female DOB: 1971-09-15, MRN: [id number]   HPI  Chief Complaint  Patient presents with   Hospitalization Follow-up    HFU for asthma. States she is still SOB everyday. Wakes up each morning tired.    Hannah Baldwin is a 51 year old woman, daily smoker with history of asthma, obesity, DMII and hypertension who is referred to pulmonary clinic after recent admission for asthma exacerbation due to upper respiratory viral infection.   She was admitted 12/13 to 12/20 for acute hypoxemic respiratory failure in setting or upper respiratory viral infection due to rhinovirus with asthma exacerbation.   She completed prednisone taper and was discharged on dulera 200-49mg 2 puffs twice daily but recently ran out of this inhaler a few days ago. She continues to experience cough, wheezing and shortness of breath. She was weaned off oxygen at time of discharge. She also has hoarse voice now.   She continues to smoke daily and is smoking 1 pack per day. She was previously smoking 2 packs per day. She has been smoking since her teenage years.  Prior to this admission she does not report history of frequent respiratory issues.   Past Medical History:  Diagnosis Date   Anemia    Asthma Dx 2014   [place]    [place]    Pneumonia ~ 2000 X 1     Family History  Problem Relation Age of Onset   Asthma Mother    Asthma Daughter      Social History   Socioeconomic History   Marital status: Widowed    Spouse name: Not on file   Number of children: Not on file   Years of education: Not on file   Highest education level: Not on file  Occupational History   Not on file  Tobacco Use   Smoking status: Former    Packs/day: 0.25    Years: 20.00    Pack years: 5.00    Types:  Cigarettes    Quit date: 01/18/2015    Years since quitting: 6.7   Smokeless tobacco: Never  Substance and Sexual Activity   Alcohol use: Yes    Comment: 01/20/2015 "dank some alcohol on my birthday, cookouts,  etc,;  maybe once/month"   Drug use: No   Sexual activity: Not Currently    Birth control/protection: None  Other Topics Concern   Not on file  Social History Narrative   Not on file   Social Determinants of Health   Financial Resource Strain: Not on file  Food Insecurity: Not on file  Transportation Needs: Not on file  Physical Activity: Not on file  Stress: Not on file  Social Connections: Not on file  Intimate Partner Violence: Not on file     No Known Allergies   Outpatient Medications Prior to Visit  Medication Sig Dispense Refill   acetaminophen (TYLENOL) 500 MG tablet Take 1,000 mg by mouth every 6 (six) hours as needed for moderate pain or headache.     albuterol (PROVENTIL) (2.5 MG/3ML) 0.083% nebulizer solution Take 3 mLs (2.5 mg total) by nebulization every 6 (six) hours as needed for wheezing or shortness of breath. 75 mL 12   albuterol (VENTOLIN HFA) 108 (90 Base) MCG/ACT inhaler Inhale 2 puffs into the lungs every  4 (four) hours as needed for wheezing or shortness of breath. 18 g 2   benzonatate (TESSALON) 100 MG capsule Take 1 capsule (100 mg total) by mouth 2 (two) times daily as needed for cough. 20 capsule 0   blood glucose meter kit and supplies KIT Dispense based on patient and insurance preference. Use up to four times daily as directed. (FOR ICD-9 250.00, 250.01). 1 each 0   Blood Glucose Monitoring Suppl (TRUE METRIX METER) w/Device KIT USE AS INSTRUCTED 1 kit 0   cetirizine (ZYRTEC) 10 MG tablet Take 1 tablet (10 mg total) by mouth daily. 30 tablet 0   DULoxetine (CYMBALTA) 60 MG capsule Take 1 capsule (60 mg total) by mouth daily. 30 capsule 4   fluconazole (DIFLUCAN) 150 MG tablet Take one tablet by mouth once monthly (Patient taking differently:  Take 150 mg by mouth See admin instructions. Once a month as needed) 1 tablet 6   furosemide (LASIX) 20 MG tablet Take 1 tablet (20 mg total) by mouth daily. 5 tablet 3   glucose blood (TRUE METRIX BLOOD GLUCOSE TEST) test strip Use as instructed 100 each 12   hydrochlorothiazide (HYDRODIURIL) 25 MG tablet Take 1 tablet (25 mg total) by mouth daily. 30 tablet 3   HYDROcodone-acetaminophen (NORCO/VICODIN) 5-325 MG tablet Take 1 tablet by mouth every 8 (eight) hours as needed for moderate pain. 15 tablet 0   lidocaine (LIDODERM) 5 % Place 1 patch onto the skin daily as needed. Apply patch to area most significant pain once per day.  Remove and discard patch within 12 hours of application. (Patient taking differently: Place 1 patch onto the skin daily as needed (pain).) 30 patch 0   lidocaine (LIDODERM) 5 % place 1 patch onto the skin daily as needed. (remove and  discard patch within 12 hours of application) 30 patch 3   losartan (COZAAR) 100 MG tablet Take 1 tablet (100 mg total) by mouth daily. 30 tablet 3   megestrol (MEGACE) 40 MG tablet TAKE 1 TABLET (40 MG TOTAL) BY MOUTH 2 (TWO) TIMES DAILY. (Patient taking differently: Take 40 mg by mouth 2 (two) times daily.) 60 tablet 4   meloxicam (MOBIC) 15 MG tablet TAKE 1 TABLET (15 MG TOTAL) BY MOUTH DAILY. (Patient taking differently: Take 15 mg by mouth daily.) 30 tablet 2   metFORMIN (GLUCOPHAGE) 500 MG tablet Take 1 tablet (500 mg total) by mouth 2 (two) times daily. 180 tablet 1   methocarbamol (ROBAXIN) 500 MG tablet Take 2 tablets (1,000 mg total) by mouth every 8 (eight) hours as needed for muscle spasms. 90 tablet 1   mometasone-formoterol (DULERA) 100-5 MCG/ACT AERO Inhale 2 puffs into the lungs 2 (two) times daily. 13 g 12   pantoprazole (PROTONIX) 40 MG tablet Take 1 tablet (40 mg total) by mouth daily at 12 noon. 30 tablet 3   simethicone (MYLICON) 80 MG chewable tablet Chew 80-160 mg by mouth every 6 (six) hours as needed for flatulence.      traZODone (DESYREL) 100 MG tablet Take 1 tablet (100 mg total) by mouth at bedtime as needed for sleep. (Patient taking differently: Take 100 mg by mouth at bedtime.) 90 tablet 1   TRUEPLUS LANCETS 28G MISC Check blood sugar TID & QHS 100 each 2   No facility-administered medications prior to visit.    Review of Systems  Constitutional:  Negative for chills, fever, malaise/fatigue and weight loss.  HENT:  Positive for sore throat. Negative for congestion and sinus  pain.   Eyes: Negative.   Respiratory:  Positive for cough, shortness of breath and wheezing. Negative for hemoptysis and sputum production.   Cardiovascular:  Negative for chest pain, palpitations, orthopnea, claudication and leg swelling.  Gastrointestinal:  Negative for abdominal pain, heartburn, nausea and vomiting.  Genitourinary: Negative.   Musculoskeletal:  Negative for joint pain and myalgias.  Skin:  Negative for rash.  Neurological:  Negative for weakness.  Endo/Heme/Allergies: Negative.   Psychiatric/Behavioral: Negative.     Objective:   Vitals:   10/16/21 1136  BP: 130/84  Pulse: 74  SpO2: 100%  Weight: (!) 312 lb (141.5 kg)  Height: '5\' 3"'  (1.6 m)     Physical Exam Constitutional:      General: She is not in acute distress.    Appearance: She is obese. She is not ill-appearing.  HENT:     Head: Normocephalic and atraumatic.  Eyes:     General: No scleral icterus.    Conjunctiva/sclera: Conjunctivae normal.     Pupils: Pupils are equal, round, and reactive to light.  Cardiovascular:     Rate and Rhythm: Normal rate and regular rhythm.     Pulses: Normal pulses.     Heart sounds: Normal heart sounds. No murmur heard. Pulmonary:     Effort: Pulmonary effort is normal.     Breath sounds: Wheezing present. No rhonchi or rales.  Abdominal:     General: Bowel sounds are normal.     Palpations: Abdomen is soft.  Musculoskeletal:     Right lower leg: No edema.     Left lower leg: No edema.   Lymphadenopathy:     Cervical: No cervical adenopathy.  Skin:    General: Skin is warm and dry.  Neurological:     General: No focal deficit present.     Mental Status: She is alert.  Psychiatric:        Mood and Affect: Mood normal.        Behavior: Behavior normal.        Thought Content: Thought content normal.        Judgment: Judgment normal.   CBC    Component Value Date/Time   WBC 20.9 (H) 09/25/2021 0041   RBC 3.93 09/25/2021 0041   HGB 10.4 (L) 09/25/2021 0041   HGB 11.1 05/10/2021 1432   HCT 34.6 (L) 09/25/2021 0041   HCT 36.6 05/10/2021 1432   PLT 372 09/25/2021 0041   PLT 371 05/10/2021 1432   MCV 88.0 09/25/2021 0041   MCV 85 05/10/2021 1432   MCH 26.5 09/25/2021 0041   MCHC 30.1 09/25/2021 0041   RDW 15.6 (H) 09/25/2021 0041   RDW 15.3 05/10/2021 1432   LYMPHSABS 5.6 (H) 09/25/2021 0041   LYMPHSABS 2.3 05/10/2021 1432   MONOABS 2.1 (H) 09/25/2021 0041   EOSABS 0.2 09/25/2021 0041   EOSABS 0.2 05/10/2021 1432   BASOSABS 0.0 09/25/2021 0041   BASOSABS 0.0 05/10/2021 1432   BMP Latest Ref Rng & Units 09/25/2021 09/22/2021 09/21/2021  Glucose 70 - 99 mg/dL 109(H) 125(H) 159(H)  BUN 6 - 20 mg/dL 16 22(H) 26(H)  Creatinine 0.44 - 1.00 mg/dL 0.80 1.04(H) 1.29(H)  BUN/Creat Ratio 9 - 23 - - -  Sodium 135 - 145 mmol/L 137 138 137  Potassium 3.5 - 5.1 mmol/L 3.8 3.8 4.4  Chloride 98 - 111 mmol/L 101 103 103  CO2 22 - 32 mmol/L '31 31 25  ' Calcium 8.9 - 10.3 mg/dL 8.5(L) 8.8(L) 9.2  Chest imaging: CXR 09/20/21 Mild cardiomegaly is noted. Both lungs are clear. The visualized skeletal structures are unremarkable.  PFT: No flowsheet data found.  Labs:  Path:  Echo:  Heart Catheterization:  Assessment & Plan:   Mild persistent asthma with exacerbation - Plan: predniSONE (DELTASONE) 10 MG tablet  Discussion: Krithika Tome is a 51 year old woman, daily smoker with history of asthma, obesity, DMII and hypertension who is referred to pulmonary clinic  after recent admission for asthma exacerbation due to upper respiratory viral infection.   She has ongoing exacerbation of her reactive airways due to the initial insult from the rhinovirus infection but she has continue to smoke during her recovery period. I strongly encouraged she quit smoking over the next couple of months to ensure her recovery but also to consider quitting for good for the long term health benefits. We discussed nicotine replacement therapy with patches and lozenges.   We will place her on an extended prednisone taper for 12 days. She is to pick up a new dulera prescription at the pharmacy as she does have refills.   She is to follow up in 1 month for close monitoring. She will likely need sleep study in the future to evaluate for concern of underlying sleep disordered breathing.   Freda Jackson, MD [facility] Office: [phone]    Current Outpatient Medications:    acetaminophen (TYLENOL) 500 MG tablet, Take 1,000 mg by mouth every 6 (six) hours as needed for moderate pain or headache., Disp: , Rfl:    albuterol (PROVENTIL) (2.5 MG/3ML) 0.083% nebulizer solution, Take 3 mLs (2.5 mg total) by nebulization every 6 (six) hours as needed for wheezing or shortness of breath., Disp: 75 mL, Rfl: 12   albuterol (VENTOLIN HFA) 108 (90 Base) MCG/ACT inhaler, Inhale 2 puffs into the lungs every 4 (four) hours as needed for wheezing or shortness of breath., Disp: 18 g, Rfl: 2   benzonatate (TESSALON) 100 MG capsule, Take 1 capsule (100 mg total) by mouth 2 (two) times daily as needed for cough., Disp: 20 capsule, Rfl: 0   blood glucose meter kit and supplies KIT, Dispense based on patient and insurance preference. Use up to four times daily as directed. (FOR ICD-9 250.00, 250.01)., Disp: 1 each, Rfl: 0   Blood Glucose Monitoring Suppl (TRUE METRIX METER) w/Device KIT, USE AS INSTRUCTED, Disp: 1 kit, Rfl: 0   cetirizine (ZYRTEC) 10 MG tablet, Take 1 tablet  (10 mg total) by mouth daily., Disp: 30 tablet, Rfl: 0   DULoxetine (CYMBALTA) 60 MG capsule, Take 1 capsule (60 mg total) by mouth daily., Disp: 30 capsule, Rfl: 4   fluconazole (DIFLUCAN) 150 MG tablet, Take one tablet by mouth once monthly (Patient taking differently: Take 150 mg by mouth See admin instructions. Once a month as needed), Disp: 1 tablet, Rfl: 6   furosemide (LASIX) 20 MG tablet, Take 1 tablet (20 mg total) by mouth daily., Disp: 5 tablet, Rfl: 3   glucose blood (TRUE METRIX BLOOD GLUCOSE TEST) test strip, Use as instructed, Disp: 100 each, Rfl: 12   hydrochlorothiazide (HYDRODIURIL) 25 MG tablet, Take 1 tablet (25 mg total) by mouth daily., Disp: 30 tablet, Rfl: 3   HYDROcodone-acetaminophen (NORCO/VICODIN) 5-325 MG tablet, Take 1 tablet by mouth every 8 (eight) hours as needed for moderate pain., Disp: 15 tablet, Rfl: 0   lidocaine (LIDODERM) 5 %, Place 1 patch onto the skin daily as needed. Apply patch to area most significant pain once  per day.  Remove and discard patch within 12 hours of application. (Patient taking differently: Place 1 patch onto the skin daily as needed (pain).), Disp: 30 patch, Rfl: 0   lidocaine (LIDODERM) 5 %, place 1 patch onto the skin daily as needed. (remove and  discard patch within 12 hours of application), Disp: 30 patch, Rfl: 3   losartan (COZAAR) 100 MG tablet, Take 1 tablet (100 mg total) by mouth daily., Disp: 30 tablet, Rfl: 3   megestrol (MEGACE) 40 MG tablet, TAKE 1 TABLET (40 MG TOTAL) BY MOUTH 2 (TWO) TIMES DAILY. (Patient taking differently: Take 40 mg by mouth 2 (two) times daily.), Disp: 60 tablet, Rfl: 4   meloxicam (MOBIC) 15 MG tablet, TAKE 1 TABLET (15 MG TOTAL) BY MOUTH DAILY. (Patient taking differently: Take 15 mg by mouth daily.), Disp: 30 tablet, Rfl: 2   metFORMIN (GLUCOPHAGE) 500 MG tablet, Take 1 tablet (500 mg total) by mouth 2 (two) times daily., Disp: 180 tablet, Rfl: 1   methocarbamol (ROBAXIN) 500 MG tablet, Take 2 tablets  (1,000 mg total) by mouth every 8 (eight) hours as needed for muscle spasms., Disp: 90 tablet, Rfl: 1   mometasone-formoterol (DULERA) 100-5 MCG/ACT AERO, Inhale 2 puffs into the lungs 2 (two) times daily., Disp: 13 g, Rfl: 12   pantoprazole (PROTONIX) 40 MG tablet, Take 1 tablet (40 mg total) by mouth daily at 12 noon., Disp: 30 tablet, Rfl: 3   predniSONE (DELTASONE) 10 MG tablet, Take 4 tablets (40 mg total) by mouth daily with breakfast for 3 days, THEN 3 tablets (30 mg total) daily with breakfast for 3 days, THEN 2 tablets (20 mg total) daily with breakfast for 3 days, THEN 1 tablet (10 mg total) daily with breakfast for 3 days., Disp: 30 tablet, Rfl: 0   simethicone (MYLICON) 80 MG chewable tablet, Chew 80-160 mg by mouth every 6 (six) hours as needed for flatulence., Disp: , Rfl:    traZODone (DESYREL) 100 MG tablet, Take 1 tablet (100 mg total) by mouth at bedtime as needed for sleep. (Patient taking differently: Take 100 mg by mouth at bedtime.), Disp: 90 tablet, Rfl: 1   TRUEPLUS LANCETS 28G MISC, Check blood sugar TID & QHS, Disp: 100 each, Rfl: 2

## 2021-10-16 NOTE — Patient Instructions (Addendum)
Start prednisone taper: 40mg  x 3 days 30mg  x 3 days 20mg  x 3 days 10mg  x 3 days  Continue on dulera inhaler 2 puffs twice daily - pick up new inhaler at pharmacy  Recommend quitting smoking with nicotine patches and nicotine lozenges. - Use 21mg  nicotine patch to start - Use mini nicotine lozenges as needed - quitting smoking will help you recover after this viral infection  Follow up in 1 month

## 2021-10-18 ENCOUNTER — Encounter: Payer: Self-pay | Admitting: Pulmonary Disease

## 2021-10-20 ENCOUNTER — Other Ambulatory Visit: Payer: Self-pay

## 2021-11-03 ENCOUNTER — Other Ambulatory Visit: Payer: Self-pay

## 2021-11-20 ENCOUNTER — Ambulatory Visit: Payer: Self-pay | Admitting: Pulmonary Disease

## 2021-12-06 ENCOUNTER — Other Ambulatory Visit: Payer: Self-pay

## 2021-12-08 ENCOUNTER — Other Ambulatory Visit: Payer: Self-pay

## 2021-12-11 ENCOUNTER — Ambulatory Visit: Payer: Medicaid Other | Admitting: Internal Medicine

## 2021-12-13 ENCOUNTER — Other Ambulatory Visit: Payer: Self-pay

## 2021-12-15 ENCOUNTER — Other Ambulatory Visit: Payer: Self-pay

## 2022-02-08 ENCOUNTER — Ambulatory Visit: Payer: Medicaid Other | Admitting: Physician Assistant

## 2022-02-28 ENCOUNTER — Ambulatory Visit: Payer: Medicaid Other | Attending: Physician Assistant | Admitting: Physician Assistant

## 2022-02-28 ENCOUNTER — Encounter: Payer: Self-pay | Admitting: Physician Assistant

## 2022-02-28 ENCOUNTER — Other Ambulatory Visit: Payer: Self-pay

## 2022-02-28 DIAGNOSIS — M5442 Lumbago with sciatica, left side: Secondary | ICD-10-CM

## 2022-02-28 DIAGNOSIS — E1169 Type 2 diabetes mellitus with other specified complication: Secondary | ICD-10-CM

## 2022-02-28 DIAGNOSIS — Z6841 Body Mass Index (BMI) 40.0 and over, adult: Secondary | ICD-10-CM

## 2022-02-28 LAB — GLUCOSE, POCT (MANUAL RESULT ENTRY): POC Glucose: 97 mg/dl (ref 70–99)

## 2022-02-28 MED ORDER — MELOXICAM 15 MG PO TABS
15.0000 mg | ORAL_TABLET | Freq: Every day | ORAL | 1 refills | Status: DC
  Filled 2022-02-28 – 2022-05-30 (×4): qty 30, 30d supply, fill #0
  Filled 2022-07-17: qty 30, 30d supply, fill #1

## 2022-02-28 MED ORDER — CYCLOBENZAPRINE HCL 5 MG PO TABS
ORAL_TABLET | ORAL | 1 refills | Status: DC
  Filled 2022-02-28: qty 60, 10d supply, fill #0
  Filled 2022-05-03 – 2022-05-30 (×2): qty 60, 20d supply, fill #0
  Filled 2022-07-17: qty 60, 20d supply, fill #1

## 2022-02-28 MED ORDER — LIDOCAINE 5 % EX PTCH
1.0000 | MEDICATED_PATCH | Freq: Every day | CUTANEOUS | 2 refills | Status: DC | PRN
  Filled 2022-02-28 – 2022-05-18 (×4): qty 30, 30d supply, fill #0

## 2022-02-28 NOTE — Progress Notes (Signed)
Patient ID: Hannah Baldwin, female   DOB: January 14, 1971, 51 y.o.   MRN: [id number]   Hannah Baldwin, is a 51 y.o. female  PJS:[id number]  YTW:[id number]  DOB - [birth year]  No chief complaint on file.      Subjective:   Hannah Baldwin is a 51 y.o. female here today for increase in low back pain and L leg radiation since riding in a car about 2 weeks ago to [employer].  Also says trazadone doesn't work but she tried her sister's Lorrin Mais which worked well.  No foot drop.  Went to sports medicine before but unable to afford MRI.  She is trying to get disability.  No urinary s/sx.    She only takes BP and blood sugar meds when she feels as tho she needs them, so, only partially compliant.     No problems updated.  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 Authorizing Provider  cyclobenzaprine (FLEXERIL) 5 MG tablet 1-2 tabs 3 times daily as needed for muscle spasms 02/28/22  Yes Freeman Caldron M, PA-C  acetaminophen (TYLENOL) 500 MG tablet Take 1,000 mg by mouth every 6 (six) hours as needed for moderate pain or headache.    [provider]  albuterol (PROVENTIL) (2.5 MG/3ML) 0.083% nebulizer solution Take 3 mLs (2.5 mg total) by nebulization every 6 (six) hours as needed for wheezing or shortness of breath. 05/10/21   Argentina Donovan, PA-C  albuterol (VENTOLIN HFA) 108 (90 Base) MCG/ACT inhaler Inhale 2 puffs into the lungs every 4 (four) hours as needed for wheezing or shortness of breath. 09/26/21   Dwyane Dee, MD  benzonatate (TESSALON) 100 MG capsule Take 1 capsule (100 mg total) by mouth 2 (two) times daily as needed for cough. 10/10/21   Charlott Rakes, MD  blood  glucose meter kit and supplies KIT Dispense based on patient and insurance preference. Use up to four times daily as directed. (FOR ICD-9 250.00, 250.01). 01/25/15   Charlott Rakes, MD  Blood Glucose Monitoring Suppl (TRUE METRIX METER) w/Device KIT USE AS INSTRUCTED 09/24/17   Ladell Pier, MD  cetirizine (ZYRTEC) 10 MG tablet Take 1 tablet (10 mg total) by mouth daily. 10/10/21   Charlott Rakes, MD  DULoxetine (CYMBALTA) 60 MG capsule Take 1 capsule (60 mg total) by mouth daily. 05/10/21   Argentina Donovan, PA-C  fluconazole (DIFLUCAN) 150 MG tablet Take one tablet by mouth once monthly Patient taking differently: Take 150 mg by mouth See admin instructions. Once a month as needed 08/11/21   Ladell Pier, MD  furosemide (LASIX) 20 MG tablet Take 1 tablet (20 mg total) by mouth daily. 10/10/21   Charlott Rakes, MD  glucose blood (TRUE METRIX BLOOD GLUCOSE TEST) test strip Use as instructed 09/24/17   Ladell Pier, MD  hydrochlorothiazide (HYDRODIURIL) 25 MG tablet Take 1 tablet (25 mg total) by mouth daily. 09/27/21   Dwyane Dee, MD  HYDROcodone-acetaminophen (NORCO/VICODIN) 5-325 MG tablet Take 1 tablet by mouth every 8 (eight) hours as needed for moderate pain. 04/10/21   Rosemarie Ax, MD  lidocaine (LIDODERM) 5 % Place  1 patch onto the skin daily as needed. Apply patch to area most significant pain once per day.  Remove and discard patch within 12 hours of application. 02/28/22   Argentina Donovan, PA-C  losartan (COZAAR) 100 MG tablet Take 1 tablet (100 mg total) by mouth daily. 09/27/21   Dwyane Dee, MD  megestrol (MEGACE) 40 MG tablet TAKE 1 TABLET (40 MG TOTAL) BY MOUTH 2 (TWO) TIMES DAILY. Patient taking differently: Take 40 mg by mouth 2 (two) times daily. 03/02/21 03/02/22  Ladell Pier, MD  meloxicam (MOBIC) 15 MG tablet Take 1 tablet (15 mg total) by mouth daily. X 1 week then prn pain 02/28/22 02/28/23  Argentina Donovan, PA-C  metFORMIN (GLUCOPHAGE) 500 MG  tablet Take 1 tablet (500 mg total) by mouth 2 (two) times daily. 05/10/21   Argentina Donovan, PA-C  mometasone-formoterol (DULERA) 100-5 MCG/ACT AERO Inhale 2 puffs into the lungs 2 (two) times daily. 05/10/21   Argentina Donovan, PA-C  pantoprazole (PROTONIX) 40 MG tablet Take 1 tablet (40 mg total) by mouth daily at 12 noon. 09/26/21   Dwyane Dee, MD  simethicone (MYLICON) 80 MG chewable tablet Chew 80-160 mg by mouth every 6 (six) hours as needed for flatulence.    [provider]  traZODone (DESYREL) 100 MG tablet Take 1 tablet (100 mg total) by mouth at bedtime as needed for sleep. Patient taking differently: Take 100 mg by mouth at bedtime. 05/10/21 05/10/22  Argentina Donovan, PA-C  TRUEPLUS LANCETS 28G MISC Check blood sugar TID & QHS 09/24/17   Ladell Pier, MD    ROS: Neg HEENT Neg resp Neg cardiac Neg GI Neg GU Neg psych Neg neuro  Objective:   Vitals:   02/28/22 1353  BP: 128/82  Pulse: 64  SpO2: 94%  Weight: (!) 308 lb 3.2 oz (139.8 kg)   Exam General appearance : Awake, alert, not in any distress. Speech Clear. Not toxic looking HEENT: Atraumatic and Normocephalic Neck: Supple, no JVD. No cervical lymphadenopathy.  Chest: Good air entry bilaterally, CTAB.  No rales/rhonchi/wheezing CVS: S1 S2 regular, no murmurs.  Back: exaggerated lumbar lordosis.  ROM about 80% of normal.  Neg SLR B.  Diminished reflexes BLE.   Extremities: B/L Lower Ext shows no edema, both legs are warm to touch Neurology: Awake alert, and oriented X 3, CN II-XII intact, Non focal Skin: No Rash  Data Review Lab Results  Component Value Date   HGBA1C 6.1 (H) 09/19/2021   HGBA1C 5.8 05/10/2021   HGBA1C 5.4 02/26/2020    Assessment & Plan   1. Type 2 diabetes mellitus with morbid obesity ([place]) Blood sugar good today. Last A1c=6.0 about 5 months ago - Glucose (CBG)  2. Low back pain with left-sided sciatica, unspecified back pain laterality, unspecified chronicity No red  flags today.  Flexeril will help with sleep and she can discuss sleep meds with PCP as I do not feel comfortable placing her on ambien without her PCP input.   - lidocaine (LIDODERM) 5 %; Place 1 patch onto the skin daily as needed. Apply patch to area most significant pain once per day.  Remove and discard patch within 12 hours of application.  Dispense: 30 patch; Refill: 2 - meloxicam (MOBIC) 15 MG tablet; Take 1 tablet (15 mg total) by mouth daily. X 1 week then prn pain  Dispense: 30 tablet; Refill: 1 - cyclobenzaprine (FLEXERIL) 5 MG tablet; 1-2 tabs 3 times daily as needed for muscle spasms  Dispense: 60 tablet; Refill: 1 - Ambulatory referral to Orthopedic Surgery  Compliance with meds vs taking them "when I feel like I need them" discussed at length  Return for Dr Wynetta Emery in about 6 weeks for check up and discuss ambien.  The patient was given clear instructions to go to ER or return to medical center if symptoms don't improve, worsen or new problems develop. The patient verbalized understanding. The patient was told to call to get lab results if they haven't heard anything in the next week.      Freeman Caldron, PA-C [facility], [place]   02/28/2022, 2:02 PM

## 2022-03-07 ENCOUNTER — Other Ambulatory Visit: Payer: Self-pay

## 2022-04-30 ENCOUNTER — Other Ambulatory Visit: Payer: Self-pay | Admitting: Pharmacist

## 2022-04-30 ENCOUNTER — Other Ambulatory Visit: Payer: Self-pay

## 2022-04-30 ENCOUNTER — Other Ambulatory Visit: Payer: Self-pay | Admitting: Internal Medicine

## 2022-04-30 DIAGNOSIS — N938 Other specified abnormal uterine and vaginal bleeding: Secondary | ICD-10-CM

## 2022-04-30 MED ORDER — MEGESTROL ACETATE 40 MG PO TABS
ORAL_TABLET | Freq: Two times a day (BID) | ORAL | 0 refills | Status: DC
  Filled 2022-04-30: qty 60, 30d supply, fill #0

## 2022-05-01 NOTE — Telephone Encounter (Signed)
error 

## 2022-05-03 ENCOUNTER — Encounter: Payer: Self-pay | Admitting: Physician Assistant

## 2022-05-03 ENCOUNTER — Ambulatory Visit: Payer: No Typology Code available for payment source | Attending: Physician Assistant | Admitting: Physician Assistant

## 2022-05-03 ENCOUNTER — Other Ambulatory Visit: Payer: Self-pay

## 2022-05-03 DIAGNOSIS — Z6841 Body Mass Index (BMI) 40.0 and over, adult: Secondary | ICD-10-CM

## 2022-05-03 DIAGNOSIS — M5416 Radiculopathy, lumbar region: Secondary | ICD-10-CM | POA: Diagnosis not present

## 2022-05-03 DIAGNOSIS — I10 Essential (primary) hypertension: Secondary | ICD-10-CM

## 2022-05-03 DIAGNOSIS — Z91199 Patient's noncompliance with other medical treatment and regimen due to unspecified reason: Secondary | ICD-10-CM

## 2022-05-03 DIAGNOSIS — E1169 Type 2 diabetes mellitus with other specified complication: Secondary | ICD-10-CM | POA: Diagnosis not present

## 2022-05-03 LAB — POCT GLYCOSYLATED HEMOGLOBIN (HGB A1C): HbA1c, POC (controlled diabetic range): 6 % (ref 0.0–7.0)

## 2022-05-03 LAB — GLUCOSE, POCT (MANUAL RESULT ENTRY): POC Glucose: 114 mg/dl — AB (ref 70–99)

## 2022-05-03 NOTE — Progress Notes (Signed)
Patient ID: Hannah Baldwin, female   DOB: 1971/09/12, 51 y.o.   MRN: [id number]   Hannah Baldwin, is a 51 y.o. female  [id number]:[id number]  [id number]  DOB - 04/20/1971  Chief Complaint  Patient presents with   Diabetes   Medication Refill       Subjective:   Hannah Baldwin is a 51 y.o. female here today for pain clinic referral.  She has an appointment at [facility] on [address] for Saturday of this week and needs the referral sent.  This appt is regarding her lower back pain  She has not been taking any of her meds bc she couldn't afford them.  Just got insurance and wants to pick up the prescriptions I sent for her in MAy 2023.   No problems updated.  ALLERGIES: No Known Allergies  PAST MEDICAL HISTORY: Past Medical History:  Diagnosis Date   Anemia    Asthma Dx 2014   Diabetes mellitus without complication ([place])    Legally blind in left eye, as defined in [place]    since birth    Pneumonia ~ 2000 X 1    MEDICATIONS AT HOME: Prior to Admission medications   Medication Sig Start Date End Date Taking? Authorizing Provider  albuterol (PROVENTIL) (2.5 MG/3ML) 0.083% nebulizer solution Take 3 mLs (2.5 mg total) by nebulization every 6 (six) hours as needed for wheezing or shortness of breath. 05/10/21  Yes Argentina Donovan, PA-C  albuterol (VENTOLIN HFA) 108 (90 Base) MCG/ACT inhaler Inhale 2 puffs into the lungs every 4 (four) hours as needed for wheezing or shortness of breath. 09/26/21  Yes Dwyane Dee, MD  benzonatate (TESSALON) 100 MG capsule Take 1 capsule (100 mg total) by mouth 2 (two) times daily as needed for cough. 10/10/21  Yes Charlott Rakes, MD  Blood Glucose Monitoring Suppl (TRUE METRIX METER) w/Device KIT USE AS INSTRUCTED 09/24/17  Yes Ladell Pier, MD  cetirizine (ZYRTEC) 10 MG tablet Take 1 tablet (10 mg total) by mouth daily. 10/10/21  Yes Charlott Rakes, MD  cyclobenzaprine (FLEXERIL) 5 MG tablet Take 1-2 tablets by mouth 3 times daily as needed for muscle  spasms. 02/28/22  Yes Argentina Donovan, PA-C  DULoxetine (CYMBALTA) 60 MG capsule Take 1 capsule (60 mg total) by mouth daily. 05/10/21  Yes Freeman Caldron M, PA-C  fluconazole (DIFLUCAN) 150 MG tablet Take one tablet by mouth once monthly Patient taking differently: Take 150 mg by mouth See admin instructions. Once a month as needed 08/11/21  Yes Ladell Pier, MD  furosemide (LASIX) 20 MG tablet Take 1 tablet (20 mg total) by mouth daily. 10/10/21  Yes Charlott Rakes, MD  glucose blood (TRUE METRIX BLOOD GLUCOSE TEST) test strip Use as instructed 09/24/17  Yes Ladell Pier, MD  hydrochlorothiazide (HYDRODIURIL) 25 MG tablet Take 1 tablet (25 mg total) by mouth daily. 09/27/21  Yes Dwyane Dee, MD  lidocaine (LIDODERM) 5 % Place 1 patch onto the skin daily as needed. Apply patch to area most significant pain once per day.  Remove and discard patch within 12 hours of application. 02/28/22  Yes Freeman Caldron M, PA-C  losartan (COZAAR) 100 MG tablet Take 1 tablet (100 mg total) by mouth daily. 09/27/21  Yes Dwyane Dee, MD  megestrol (MEGACE) 40 MG tablet TAKE 1 TABLET (40 MG TOTAL) BY MOUTH 2 (TWO) TIMES DAILY. 04/30/22  Yes Ladell Pier, MD  meloxicam (MOBIC) 15 MG tablet Take 1 tablet (15 mg total) by mouth daily for 1  week then as needed for pain. 02/28/22 02/28/23 Yes Desteni Piscopo, Dionne [place]cy, PA-C  metFORMIN (GLUCOPHAGE) 500 MG tablet Take 1 tablet (500 mg total) by mouth 2 (two) times daily. 05/10/21  Yes Nygeria Lager M, PA-C  mometasone-formoterol (DULERA) 100-5 MCG/ACT AERO Inhale 2 puffs into the lungs 2 (two) times daily. 05/10/21  Yes Freeman Caldron M, PA-C  pantoprazole (PROTONIX) 40 MG tablet Take 1 tablet (40 mg total) by mouth daily at 12 noon. 09/26/21  Yes Dwyane Dee, MD  simethicone (MYLICON) 80 MG chewable tablet Chew 80-160 mg by mouth every 6 (six) hours as needed for flatulence.   Yes [provider]  traZODone (DESYREL) 100 MG tablet Take 1 tablet (100 mg  total) by mouth at bedtime as needed for sleep. Patient taking differently: Take 100 mg by mouth at bedtime. 05/10/21 05/10/22 Yes Hallel Denherder, [place]ne Bucy, PA-C  TRUEPLUS LANCETS 28G MISC Check blood sugar TID & QHS 09/24/17  Yes Ladell Pier, MD  acetaminophen (TYLENOL) 500 MG tablet Take 1,000 mg by mouth every 6 (six) hours as needed for moderate pain or headache. Patient not taking: Reported on 05/03/2022    [provider]  blood glucose meter kit and supplies KIT Dispense based on patient and insurance preference. Use up to four times daily as directed. (FOR ICD-9 250.00, 250.01). Patient not taking: Reported on 05/03/2022 01/25/15   Charlott Rakes, MD  HYDROcodone-acetaminophen (NORCO/VICODIN) 5-325 MG tablet Take 1 tablet by mouth every 8 (eight) hours as needed for moderate pain. 04/10/21   Rosemarie Ax, MD    ROS: Neg HEENT Neg resp Neg cardiac Neg GI Neg GU Neg psych Neg neuro  Objective:   Vitals:   05/03/22 1110 05/03/22 1125  BP: (!) 164/106 140/90  Pulse: 75   SpO2: 94%   Weight: (!) 311 lb 12.8 oz (141.4 kg)   Height: 5' 3.5" (1.613 m)    Exam General appearance : Awake, alert, not in any distress. Speech Clear. Not toxic looking HEENT: Atraumatic and Normocephalic Neck: Supple, no JVD. No cervical lymphadenopathy.  Chest: Good air entry bilaterally, CTAB.  No rales/rhonchi/wheezing CVS: S1 S2 regular, no murmurs.  Extremities: B/L Lower Ext shows no edema, both legs are warm to touch Neurology: Awake alert, and oriented X 3, CN II-XII intact, Non focal Skin: No Rash  Data Review Lab Results  Component Value Date   HGBA1C 6.0 05/03/2022   HGBA1C 6.1 (H) 09/19/2021   HGBA1C 5.8 05/10/2021    Assessment & Plan   1. Type 2 diabetes mellitus with morbid obesity (HCC) Controlled-resume metformin - Glucose (CBG) - HgB A1c  2. Lumbar radiculopathy - Ambulatory referral to Pain Clinic  3. Noncompliance Compliance imperative.  Patient verbalizes  understanding  4. Essential hypertension Not at goal.  Pick up and resume meds today    Return in about 3 months (around 08/03/2022) for PCP for chronic conditions.  The patient was given clear instructions to go to ER or return to medical center if symptoms don't improve, worsen or new problems develop. The patient verbalized understanding. The patient was told to call to get lab results if they haven't heard anything in the next week.      Freeman Caldron, PA-C [facility] [place], [place]   05/03/2022, 12:23 PM

## 2022-05-10 ENCOUNTER — Other Ambulatory Visit: Payer: Self-pay

## 2022-05-16 ENCOUNTER — Other Ambulatory Visit: Payer: Self-pay

## 2022-05-16 ENCOUNTER — Other Ambulatory Visit: Payer: Self-pay | Admitting: Physician Assistant

## 2022-05-16 DIAGNOSIS — M5442 Lumbago with sciatica, left side: Secondary | ICD-10-CM

## 2022-05-16 DIAGNOSIS — G4700 Insomnia, unspecified: Secondary | ICD-10-CM

## 2022-05-16 DIAGNOSIS — F32A Depression, unspecified: Secondary | ICD-10-CM

## 2022-05-16 MED ORDER — DULOXETINE HCL 60 MG PO CPEP
60.0000 mg | ORAL_CAPSULE | Freq: Every day | ORAL | 2 refills | Status: DC
  Filled 2022-05-16 – 2022-05-30 (×2): qty 30, 30d supply, fill #0
  Filled 2022-07-17: qty 30, 30d supply, fill #1
  Filled 2022-08-22: qty 30, 30d supply, fill #2

## 2022-05-16 MED ORDER — TRAZODONE HCL 100 MG PO TABS
100.0000 mg | ORAL_TABLET | Freq: Every evening | ORAL | 2 refills | Status: DC | PRN
  Filled 2022-05-16 – 2022-05-30 (×2): qty 30, 30d supply, fill #0
  Filled 2022-07-17: qty 30, 30d supply, fill #1
  Filled 2022-08-22: qty 30, 30d supply, fill #2

## 2022-05-16 NOTE — Telephone Encounter (Signed)
Requested medication (s) are due for refill today - expired Rx  Requested medication (s) are on the active medication list -yes  Future visit scheduled -yes  Last refill: 05/10/21  Notes to clinic: expired Rx- last visit states resume medications but Rx has expired  Requested Prescriptions  Pending Prescriptions Disp Refills   traZODone (DESYREL) 100 MG tablet 90 tablet 1    Sig: Take 1 tablet (100 mg total) by mouth at bedtime as needed for sleep.     Psychiatry: Antidepressants - Serotonin Modulator Passed - 05/16/2022  9:16 AM      Passed - Valid encounter within last 6 months    Recent Outpatient Visits           1 week ago Type 2 diabetes mellitus with morbid obesity [facility])   [place], [place], [place]   2 months ago Type 2 diabetes mellitus with morbid obesity [facility])   [place], [place], [place]   7 months ago Moderate persistent asthma without complication   [place], [place], MD   9 months ago Type 2 diabetes mellitus with morbid obesity [facility])   [place], Deborah B, MD   1 year ago Type 2 diabetes, controlled, with neuropathy [facility])   [place], [place], [place]       Future Appointments             In 2 months Ladell Pier, MD [place]             DULoxetine (CYMBALTA) 60 MG capsule 30 capsule 4    Sig: Take 1 capsule (60 mg total) by mouth daily.     Psychiatry: Antidepressants - SNRI - duloxetine Failed - 05/16/2022  9:16 AM      Failed - Last BP in normal range    BP Readings from Last 1 Encounters:  05/03/22 140/90         Passed - Cr in normal range and within 360 days    Creat  Date Value Ref Range Status  12/20/2016 0.85 0.50 - 1.10 mg/dL Final   Creatinine, Ser  Date Value Ref Range Status  09/25/2021 0.80 0.44 -  1.00 mg/dL Final   Creatinine, Urine  Date Value Ref Range Status  12/20/2016 243 20 - 320 mg/dL Final         Passed - eGFR is 30 or above and within 360 days    GFR, Est African American  Date Value Ref Range Status  09/05/2015 82 >=60 mL/min Final   GFR calc Af Amer  Date Value Ref Range Status  02/26/2020 89 >59 mL/min/1.73 Final    Comment:    **[facility] currently reports eGFR in compliance with the current**   recommendations of the [employer]. [facility] will   update reporting as new guidelines are published from the NKF-ASN   Task force.    GFR, Est Non African American  Date Value Ref Range Status  09/05/2015 71 >=60 mL/min Final    Comment:      The estimated GFR is a calculation valid for adults (>=55 years old) that uses the CKD-EPI algorithm to adjust for age and sex. It is   not to be used for children, pregnant women, hospitalized patients,    patients on dialysis, or with rapidly changing kidney function. According  to the NKDEP, eGFR >89 is normal, 60-89 shows mild impairment, 30-59 shows moderate impairment, 15-29 shows severe impairment and <15 is ESRD.      GFR, Estimated  Date Value Ref Range Status  09/25/2021 >60 >60 mL/min Final    Comment:    (NOTE) Calculated using the CKD-EPI Creatinine Equation (2021)    eGFR  Date Value Ref Range Status  05/10/2021 82 >59 mL/min/1.73 Final         Passed - Completed PHQ-2 or PHQ-9 in the last 360 days      Passed - Valid encounter within last 6 months    Recent Outpatient Visits           1 week ago Type 2 diabetes mellitus with morbid obesity [facility])   [place], [place], [place]   2 months ago Type 2 diabetes mellitus with morbid obesity [facility])   [place], [place], [place]   7 months ago Moderate persistent asthma without complication   [place], [place], [place]   9  months ago Type 2 diabetes mellitus with morbid obesity [facility])   [place], Deborah B, MD   1 year ago Type 2 diabetes, controlled, with neuropathy [facility])   [place], [place], [place]       Future Appointments             In 2 months Ladell Pier, MD [place]               Requested Prescriptions  Pending Prescriptions Disp Refills   traZODone (DESYREL) 100 MG tablet 90 tablet 1    Sig: Take 1 tablet (100 mg total) by mouth at bedtime as needed for sleep.     Psychiatry: Antidepressants - Serotonin Modulator Passed - 05/16/2022  9:16 AM      Passed - Valid encounter within last 6 months    Recent Outpatient Visits           1 week ago Type 2 diabetes mellitus with morbid obesity [facility])   [place], [place], [place]   2 months ago Type 2 diabetes mellitus with morbid obesity [facility])   [place], [place], [place]   7 months ago Moderate persistent asthma without complication   [place], [place], [place]   9 months ago Type 2 diabetes mellitus with morbid obesity [facility])   [place], Deborah B, MD   1 year ago Type 2 diabetes, controlled, with neuropathy [facility])   [place], [place], [place]       Future Appointments             In 2 months Ladell Pier, MD [place]             DULoxetine (CYMBALTA) 60 MG capsule 30 capsule 4    Sig: Take 1 capsule (60 mg total) by mouth daily.     Psychiatry: Antidepressants - SNRI - duloxetine Failed - 05/16/2022  9:16 AM      Failed - Last BP in normal range    BP Readings from Last 1 Encounters:  05/03/22 140/90         Passed -  Cr in normal range and within 360 days    Creat  Date Value  Ref Range Status  12/20/2016 0.85 0.50 - 1.10 mg/dL Final   Creatinine, Ser  Date Value Ref Range Status  09/25/2021 0.80 0.44 - 1.00 mg/dL Final   Creatinine, Urine  Date Value Ref Range Status  12/20/2016 243 20 - 320 mg/dL Final         Passed - eGFR is 30 or above and within 360 days    GFR, Est African American  Date Value Ref Range Status  09/05/2015 82 >=60 mL/min Final   GFR calc Af Amer  Date Value Ref Range Status  02/26/2020 89 >59 mL/min/1.73 Final    Comment:    **[facility] currently reports eGFR in compliance with the current**   recommendations of the [employer]. [facility] will   update reporting as new guidelines are published from the NKF-ASN   Task force.    GFR, Est Non African American  Date Value Ref Range Status  09/05/2015 71 >=60 mL/min Final    Comment:      The estimated GFR is a calculation valid for adults (>=20 years old) that uses the CKD-EPI algorithm to adjust for age and sex. It is   not to be used for children, pregnant women, hospitalized patients,    patients on dialysis, or with rapidly changing kidney function. According to the NKDEP, eGFR >89 is normal, 60-89 shows mild impairment, 30-59 shows moderate impairment, 15-29 shows severe impairment and <15 is ESRD.      GFR, Estimated  Date Value Ref Range Status  09/25/2021 >60 >60 mL/min Final    Comment:    (NOTE) Calculated using the CKD-EPI Creatinine Equation (2021)    eGFR  Date Value Ref Range Status  05/10/2021 82 >59 mL/min/1.73 Final         Passed - Completed PHQ-2 or PHQ-9 in the last 360 days      Passed - Valid encounter within last 6 months    Recent Outpatient Visits           1 week ago Type 2 diabetes mellitus with morbid obesity [facility])   [place], [place], [place]   2 months ago Type 2 diabetes mellitus with morbid obesity [facility])   [place], [place], [place]   7  months ago Moderate persistent asthma without complication   [place], [place], [place]   9 months ago Type 2 diabetes mellitus with morbid obesity [facility])   [place] Ladell Pier, MD   1 year ago Type 2 diabetes, controlled, with neuropathy [facility])   [place], Dionne [place], [place]       Future Appointments             In 2 months Wynetta Emery, [place], MD [place]

## 2022-05-18 ENCOUNTER — Other Ambulatory Visit: Payer: Self-pay

## 2022-05-22 ENCOUNTER — Other Ambulatory Visit: Payer: Self-pay

## 2022-05-30 ENCOUNTER — Other Ambulatory Visit: Payer: Self-pay | Admitting: Internal Medicine

## 2022-05-30 ENCOUNTER — Other Ambulatory Visit: Payer: Self-pay | Admitting: Physician Assistant

## 2022-05-30 ENCOUNTER — Other Ambulatory Visit: Payer: Self-pay

## 2022-05-30 ENCOUNTER — Telehealth: Payer: Self-pay

## 2022-05-30 DIAGNOSIS — N938 Other specified abnormal uterine and vaginal bleeding: Secondary | ICD-10-CM

## 2022-05-30 DIAGNOSIS — J4540 Moderate persistent asthma, uncomplicated: Secondary | ICD-10-CM

## 2022-05-30 MED ORDER — MEGESTROL ACETATE 40 MG PO TABS
ORAL_TABLET | Freq: Two times a day (BID) | ORAL | 2 refills | Status: DC
  Filled 2022-05-30: qty 60, 30d supply, fill #0
  Filled 2022-06-27: qty 60, 30d supply, fill #1
  Filled 2022-07-30: qty 60, 30d supply, fill #2

## 2022-05-30 MED ORDER — DULERA 100-5 MCG/ACT IN AERO
2.0000 | INHALATION_SPRAY | Freq: Two times a day (BID) | RESPIRATORY_TRACT | 12 refills | Status: DC
  Filled 2022-05-30: qty 13, 25d supply, fill #0

## 2022-05-30 NOTE — Telephone Encounter (Signed)
LIDOCAINE PATCH PA DENIED, MEDICAL CRITERIA NOT MET.  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

## 2022-05-31 ENCOUNTER — Other Ambulatory Visit: Payer: Self-pay

## 2022-06-04 ENCOUNTER — Other Ambulatory Visit: Payer: Self-pay

## 2022-06-04 MED ORDER — ALBUTEROL SULFATE HFA 108 (90 BASE) MCG/ACT IN AERS
INHALATION_SPRAY | RESPIRATORY_TRACT | 1 refills | Status: DC
  Filled 2022-06-04: qty 6.7, 28d supply, fill #0
  Filled 2022-07-17: qty 6.7, 28d supply, fill #1

## 2022-06-05 ENCOUNTER — Other Ambulatory Visit: Payer: Self-pay

## 2022-06-15 ENCOUNTER — Other Ambulatory Visit: Payer: Self-pay

## 2022-06-27 ENCOUNTER — Other Ambulatory Visit: Payer: Self-pay

## 2022-06-28 ENCOUNTER — Other Ambulatory Visit: Payer: Self-pay

## 2022-07-17 ENCOUNTER — Other Ambulatory Visit: Payer: Self-pay

## 2022-07-30 ENCOUNTER — Other Ambulatory Visit: Payer: Self-pay

## 2022-08-06 ENCOUNTER — Ambulatory Visit: Payer: Commercial Managed Care - HMO | Admitting: Internal Medicine

## 2022-08-22 ENCOUNTER — Other Ambulatory Visit: Payer: Self-pay | Admitting: Physician Assistant

## 2022-08-22 ENCOUNTER — Other Ambulatory Visit: Payer: Self-pay

## 2022-08-22 DIAGNOSIS — M5442 Lumbago with sciatica, left side: Secondary | ICD-10-CM

## 2022-08-22 MED ORDER — MELOXICAM 15 MG PO TABS
15.0000 mg | ORAL_TABLET | Freq: Every day | ORAL | 0 refills | Status: DC
  Filled 2022-08-22: qty 30, 30d supply, fill #0

## 2022-08-22 MED ORDER — CYCLOBENZAPRINE HCL 5 MG PO TABS
5.0000 mg | ORAL_TABLET | Freq: Three times a day (TID) | ORAL | 0 refills | Status: DC | PRN
  Filled 2022-08-22: qty 60, 10d supply, fill #0

## 2022-08-28 ENCOUNTER — Other Ambulatory Visit: Payer: Self-pay

## 2022-08-28 ENCOUNTER — Other Ambulatory Visit: Payer: Self-pay | Admitting: Internal Medicine

## 2022-08-28 MED ORDER — ALBUTEROL SULFATE HFA 108 (90 BASE) MCG/ACT IN AERS
2.0000 | INHALATION_SPRAY | RESPIRATORY_TRACT | 0 refills | Status: DC | PRN
  Filled 2022-08-28: qty 6.7, 17d supply, fill #0

## 2022-08-31 ENCOUNTER — Other Ambulatory Visit: Payer: Self-pay

## 2022-08-31 ENCOUNTER — Other Ambulatory Visit: Payer: Self-pay | Admitting: Internal Medicine

## 2022-08-31 DIAGNOSIS — L304 Erythema intertrigo: Secondary | ICD-10-CM

## 2022-09-03 ENCOUNTER — Other Ambulatory Visit: Payer: Self-pay | Admitting: Internal Medicine

## 2022-09-03 DIAGNOSIS — N938 Other specified abnormal uterine and vaginal bleeding: Secondary | ICD-10-CM

## 2022-09-03 NOTE — Telephone Encounter (Signed)
Requested medication (s) are due for refill today:routing for review.  Requested medication (s) are on the active medication list:yes  Last refill:  08/11/21  Future visit scheduled: yes  Notes to clinic:   Medication not assigned to a protocol, review manually. Should patient continue to take, routing for review.     Requested Prescriptions  Pending Prescriptions Disp Refills   fluconazole (DIFLUCAN) 150 MG tablet 1 tablet 6    Sig: Take one tablet by mouth once monthly     Off-Protocol Failed - 08/31/2022 11:23 AM      Failed - Medication not assigned to a protocol, review manually.      Passed - Valid encounter within last 12 months    Recent Outpatient Visits           4 months ago Type 2 diabetes mellitus with morbid obesity [facility])   [place], [place], [place]   6 months ago Type 2 diabetes mellitus with morbid obesity [facility])   [place], [place], [place]   10 months ago Moderate persistent asthma without complication   [place], Charlane Ferretti, MD   1 year ago Type 2 diabetes mellitus with morbid obesity [facility])   [place], [place]   1 year ago Type 2 diabetes, controlled, with neuropathy [facility])   [place], Dionne [place], [place]       Future Appointments             In 2 weeks Ladell Pier, MD [place]

## 2022-09-04 ENCOUNTER — Other Ambulatory Visit: Payer: Self-pay

## 2022-09-04 MED ORDER — MEGESTROL ACETATE 40 MG PO TABS
40.0000 mg | ORAL_TABLET | Freq: Two times a day (BID) | ORAL | 0 refills | Status: DC
  Filled 2022-09-04: qty 60, 30d supply, fill #0

## 2022-09-04 NOTE — Telephone Encounter (Signed)
Requested medication (s) are due for refill today: yes  Requested medication (s) are on the active medication list: yes  Last refill:  05/30/22 #60 2 RF  Future visit scheduled: yes  Notes to clinic:  this medication not delegated to NT to reorder   Requested Prescriptions  Pending Prescriptions Disp Refills   megestrol (MEGACE) 40 MG tablet 60 tablet 2    Sig: TAKE 1 TABLET (40 MG TOTAL) BY MOUTH 2 (TWO) TIMES DAILY.     Not Delegated - OB/GYN:  Progestins - megestrol acetate Failed - 09/03/2022  9:32 PM      Failed - This refill cannot be delegated      Failed - Last BP in normal range    BP Readings from Last 1 Encounters:  05/03/22 140/90         Passed - Valid encounter within last 12 months    Recent Outpatient Visits           4 months ago Type 2 diabetes mellitus with morbid obesity [facility])   [place], [place], [place]   6 months ago Type 2 diabetes mellitus with [facility]bid [facility])   [place], [place], [place]   10 months ago Moderate persistent asthma without complication   [place], Charlane Ferretti, MD   1 year ago Type 2 diabetes mellitus with morbid obesity [facility])   [place], MD   1 year ago Type 2 diabetes, controlled, with neuropathy [facility])   [place], Dion[place], [place]       Future Appointments             In 2 weeks Ladell Pier, MD [place]

## 2022-09-05 ENCOUNTER — Other Ambulatory Visit: Payer: Self-pay

## 2022-09-11 ENCOUNTER — Other Ambulatory Visit (HOSPITAL_COMMUNITY): Payer: Self-pay

## 2022-09-11 DIAGNOSIS — Z79899 Other long term (current) drug therapy: Secondary | ICD-10-CM | POA: Diagnosis not present

## 2022-09-11 MED ORDER — OXYCODONE-ACETAMINOPHEN 10-325 MG PO TABS
1.0000 | ORAL_TABLET | Freq: Three times a day (TID) | ORAL | 0 refills | Status: DC | PRN
  Filled 2022-09-11: qty 90, 30d supply, fill #0

## 2022-09-21 ENCOUNTER — Ambulatory Visit: Payer: No Typology Code available for payment source | Admitting: Internal Medicine

## 2022-10-03 ENCOUNTER — Other Ambulatory Visit: Payer: Self-pay

## 2022-10-03 ENCOUNTER — Other Ambulatory Visit (HOSPITAL_COMMUNITY): Payer: Self-pay

## 2022-10-03 ENCOUNTER — Other Ambulatory Visit: Payer: Self-pay | Admitting: Internal Medicine

## 2022-10-03 DIAGNOSIS — L304 Erythema intertrigo: Secondary | ICD-10-CM

## 2022-10-03 DIAGNOSIS — F32A Depression, unspecified: Secondary | ICD-10-CM

## 2022-10-03 DIAGNOSIS — N938 Other specified abnormal uterine and vaginal bleeding: Secondary | ICD-10-CM

## 2022-10-03 DIAGNOSIS — G4700 Insomnia, unspecified: Secondary | ICD-10-CM

## 2022-10-03 DIAGNOSIS — M5442 Lumbago with sciatica, left side: Secondary | ICD-10-CM

## 2022-10-03 MED ORDER — MEGESTROL ACETATE 40 MG PO TABS
40.0000 mg | ORAL_TABLET | Freq: Two times a day (BID) | ORAL | 0 refills | Status: DC
  Filled 2022-10-03: qty 60, 30d supply, fill #0

## 2022-10-03 MED ORDER — DULOXETINE HCL 60 MG PO CPEP
60.0000 mg | ORAL_CAPSULE | Freq: Every day | ORAL | 0 refills | Status: DC
  Filled 2022-10-03: qty 30, 30d supply, fill #0

## 2022-10-03 MED ORDER — TRAZODONE HCL 100 MG PO TABS
100.0000 mg | ORAL_TABLET | Freq: Every evening | ORAL | 0 refills | Status: DC | PRN
  Filled 2022-10-03: qty 30, 30d supply, fill #0

## 2022-10-03 MED ORDER — CYCLOBENZAPRINE HCL 5 MG PO TABS
5.0000 mg | ORAL_TABLET | Freq: Three times a day (TID) | ORAL | 0 refills | Status: DC | PRN
  Filled 2022-10-03: qty 60, 10d supply, fill #0

## 2022-10-03 MED ORDER — MELOXICAM 15 MG PO TABS
15.0000 mg | ORAL_TABLET | Freq: Every day | ORAL | 0 refills | Status: DC
  Filled 2022-10-03: qty 30, 30d supply, fill #0

## 2022-10-04 ENCOUNTER — Other Ambulatory Visit: Payer: Self-pay

## 2022-10-04 MED ORDER — FLUCONAZOLE 150 MG PO TABS
150.0000 mg | ORAL_TABLET | ORAL | 6 refills | Status: DC
  Filled 2022-10-04 – 2022-10-11 (×2): qty 1, 30d supply, fill #0
  Filled 2022-11-01 – 2022-11-12 (×2): qty 1, 30d supply, fill #1
  Filled 2022-12-14: qty 1, 30d supply, fill #2
  Filled 2023-01-18: qty 1, 30d supply, fill #3
  Filled 2023-03-06: qty 1, 30d supply, fill #4

## 2022-10-05 ENCOUNTER — Other Ambulatory Visit (HOSPITAL_COMMUNITY): Payer: Self-pay

## 2022-10-11 ENCOUNTER — Other Ambulatory Visit: Payer: Self-pay

## 2022-10-12 ENCOUNTER — Other Ambulatory Visit (HOSPITAL_COMMUNITY): Payer: Self-pay

## 2022-10-12 DIAGNOSIS — Z79899 Other long term (current) drug therapy: Secondary | ICD-10-CM | POA: Diagnosis not present

## 2022-10-12 MED ORDER — OXYCODONE-ACETAMINOPHEN 10-325 MG PO TABS
1.0000 | ORAL_TABLET | Freq: Four times a day (QID) | ORAL | 0 refills | Status: DC
  Filled 2022-10-12: qty 120, 30d supply, fill #0

## 2022-10-12 MED ORDER — TRULICITY 1.5 MG/0.5ML ~~LOC~~ SOAJ
1.5000 mg | SUBCUTANEOUS | 0 refills | Status: DC
  Filled 2022-10-12: qty 2, 28d supply, fill #0

## 2022-10-15 ENCOUNTER — Encounter: Payer: Self-pay | Admitting: Physician Assistant

## 2022-10-15 ENCOUNTER — Other Ambulatory Visit: Payer: Self-pay

## 2022-10-15 ENCOUNTER — Ambulatory Visit: Payer: Commercial Managed Care - HMO | Attending: Internal Medicine | Admitting: Physician Assistant

## 2022-10-15 DIAGNOSIS — F32A Depression, unspecified: Secondary | ICD-10-CM | POA: Diagnosis not present

## 2022-10-15 DIAGNOSIS — E1169 Type 2 diabetes mellitus with other specified complication: Secondary | ICD-10-CM | POA: Diagnosis not present

## 2022-10-15 DIAGNOSIS — N950 Postmenopausal bleeding: Secondary | ICD-10-CM | POA: Diagnosis not present

## 2022-10-15 DIAGNOSIS — N938 Other specified abnormal uterine and vaginal bleeding: Secondary | ICD-10-CM | POA: Diagnosis not present

## 2022-10-15 DIAGNOSIS — M5442 Lumbago with sciatica, left side: Secondary | ICD-10-CM

## 2022-10-15 DIAGNOSIS — M25562 Pain in left knee: Secondary | ICD-10-CM

## 2022-10-15 LAB — POCT GLYCOSYLATED HEMOGLOBIN (HGB A1C): HbA1c, POC (controlled diabetic range): 6.1 % (ref 0.0–7.0)

## 2022-10-15 LAB — GLUCOSE, POCT (MANUAL RESULT ENTRY): POC Glucose: 136 mg/dl — AB (ref 70–99)

## 2022-10-15 MED ORDER — ALBUTEROL SULFATE HFA 108 (90 BASE) MCG/ACT IN AERS
2.0000 | INHALATION_SPRAY | RESPIRATORY_TRACT | 1 refills | Status: DC | PRN
  Filled 2022-10-15 – 2022-11-12 (×2): qty 6.7, 17d supply, fill #0
  Filled 2023-01-18 – 2023-01-28 (×4): qty 6.7, 17d supply, fill #1

## 2022-10-15 MED ORDER — DULOXETINE HCL 60 MG PO CPEP
60.0000 mg | ORAL_CAPSULE | Freq: Every day | ORAL | 5 refills | Status: DC
  Filled 2022-10-15 – 2022-11-12 (×3): qty 30, 30d supply, fill #0
  Filled 2022-12-14: qty 30, 30d supply, fill #1
  Filled 2023-01-18: qty 30, 30d supply, fill #2
  Filled 2023-03-06: qty 30, 30d supply, fill #3
  Filled 2023-04-19 (×2): qty 30, 30d supply, fill #0

## 2022-10-15 MED ORDER — MELOXICAM 15 MG PO TABS
15.0000 mg | ORAL_TABLET | Freq: Every day | ORAL | 3 refills | Status: DC
Start: 2022-10-15 — End: 2023-05-21
  Filled 2022-10-15 – 2022-11-12 (×3): qty 30, 30d supply, fill #0
  Filled 2022-12-14: qty 30, 30d supply, fill #1
  Filled 2023-01-18: qty 30, 30d supply, fill #2
  Filled 2023-03-06: qty 30, 30d supply, fill #3

## 2022-10-15 MED ORDER — TRULICITY 1.5 MG/0.5ML ~~LOC~~ SOAJ
1.5000 mg | SUBCUTANEOUS | 5 refills | Status: DC
  Filled 2022-10-15 – 2023-03-06 (×2): qty 2, 28d supply, fill #0

## 2022-10-15 MED ORDER — MEGESTROL ACETATE 40 MG PO TABS
40.0000 mg | ORAL_TABLET | Freq: Two times a day (BID) | ORAL | 5 refills | Status: DC
  Filled 2022-10-15 – 2022-11-12 (×3): qty 60, 30d supply, fill #0
  Filled 2022-12-14: qty 60, 30d supply, fill #1
  Filled 2023-01-18: qty 60, 30d supply, fill #2
  Filled 2023-03-06: qty 60, 30d supply, fill #3
  Filled 2023-04-19 (×2): qty 60, 30d supply, fill #0
  Filled 2023-05-29: qty 60, 30d supply, fill #1

## 2022-10-15 NOTE — Progress Notes (Signed)
Patient ID: Hannah Baldwin, female   DOB: 24-Mar-1971, 52 y.o.   MRN: [id number]   Hannah Baldwin, is a 52 y.o. female  JEH:[id number]  [id number]  DOB - 04-21-1971  Chief Complaint  Patient presents with   Knee Pain   Diabetes       Subjective:   Hannah Baldwin is a 52 y.o. female here today for RF and is overdue for lab work.  She is under the care of pain management.  She has noticed over the last few months her L knee has been giving way or locking up at times.  NKI.    She is also having some breakthrough bleeding megace.  She thinks she went thru menopause a few years(?) ago but was put on megace for DUB.  She is now having periodic spotting as well as week long bleeds sometimes.  She is compliant with megace and does not miss doses.   No problems updated.  ALLERGIES: No Known Allergies  PAST MEDICAL HISTORY: Past Medical History:  Diagnosis Date   Anemia    Asthma Dx 2014   Diabetes mellitus without complication ([place])    Legally blind in left eye, as defined in [place]    since birth    Pneumonia ~ 2000 X 1    MEDICATIONS AT HOME: Prior to Admission medications   Medication Sig Start Date End Date Taking? Authorizing Provider  acetaminophen (TYLENOL) 500 MG tablet Take 1,000 mg by mouth every 6 (six) hours as needed for moderate pain or headache.   Yes [provider]  albuterol (PROVENTIL) (2.5 MG/3ML) 0.083% nebulizer solution Take 3 mLs (2.5 mg total) by nebulization every 6 (six) hours as needed for wheezing or shortness of breath. 05/10/21  Yes Nygel Prokop M, PA-C  benzonatate (TESSALON) 100 MG capsule Take 1 capsule (100 mg total) by mouth 2 (two) times daily as needed for cough. 10/10/21  Yes Charlott Rakes, MD  blood glucose meter kit and supplies KIT Dispense based on patient and insurance preference. Use up to four times daily as directed. (FOR ICD-9 250.00, 250.01). 01/25/15  Yes Charlott Rakes, MD  Blood Glucose Monitoring Suppl (TRUE METRIX METER)  w/Device KIT USE AS INSTRUCTED 09/24/17  Yes Ladell Pier, MD  cetirizine (ZYRTEC) 10 MG tablet Take 1 tablet (10 mg total) by mouth daily. 10/10/21  Yes Charlott Rakes, MD  cyclobenzaprine (FLEXERIL) 5 MG tablet Take 1-2 tablets (5-10 mg total) by mouth 3 (three) times daily as needed for muscle spasms. 10/03/22  Yes Ladell Pier, MD  fluconazole (DIFLUCAN) 150 MG tablet Take 1 tablet (150 mg total) by mouth every 30 (thirty) days. 10/04/22  Yes Ladell Pier, MD  furosemide (LASIX) 20 MG tablet Take 1 tablet (20 mg total) by mouth daily. 10/10/21  Yes Charlott Rakes, MD  glucose blood (TRUE METRIX BLOOD GLUCOSE TEST) test strip Use as instructed 09/24/17  Yes Ladell Pier, MD  hydrochlorothiazide (HYDRODIURIL) 25 MG tablet Take 1 tablet (25 mg total) by mouth daily. 09/27/21  Yes Dwyane Dee, MD  HYDROcodone-acetaminophen (NORCO/VICODIN) 5-325 MG tablet Take 1 tablet by mouth every 8 (eight) hours as needed for moderate pain. 04/10/21  Yes Rosemarie Ax, MD  lidocaine (LIDODERM) 5 % Place 1 patch onto the skin daily as needed. Apply patch to area most significant pain once per day.  Remove and discard patch within 12 hours of application. 02/28/22  Yes Freeman Caldron M, PA-C  losartan (COZAAR) 100 MG tablet Take 1  tablet (100 mg total) by mouth daily. 09/27/21  Yes Dwyane Dee, MD  metFORMIN (GLUCOPHAGE) 500 MG tablet Take 1 tablet (500 mg total) by mouth 2 (two) times daily. 05/10/21  Yes Aslee Such M, PA-C  mometasone-formoterol (DULERA) 100-5 MCG/ACT AERO Inhale 2 puffs into the lungs 2 (two) times daily. 05/30/22  Yes Ladell Pier, MD  pantoprazole (PROTONIX) 40 MG tablet Take 1 tablet (40 mg total) by mouth daily at 12 noon. 09/26/21  Yes Dwyane Dee, MD  simethicone (MYLICON) 80 MG chewable tablet Chew 80-160 mg by mouth every 6 (six) hours as needed for flatulence.   Yes [provider]  traZODone (DESYREL) 100 MG tablet Take 1 tablet (100 mg  total) by mouth at bedtime as needed for sleep. 10/03/22 10/03/23 Yes Ladell Pier, MD  TRUEPLUS LANCETS 28G MISC Check blood sugar TID & QHS 09/24/17  Yes Ladell Pier, MD  albuterol (VENTOLIN HFA) 108 (90 Base) MCG/ACT inhaler Inhale 2 puffs into the lungs every 4 hours as needed for wheezing or shortness of breath . 10/15/22   Argentina Donovan, PA-C  Dulaglutide (TRULICITY) 1.5 JA/2.5KN SOPN Inject 1.5 mg into the skin once a week. 10/15/22   Argentina Donovan, PA-C  DULoxetine (CYMBALTA) 60 MG capsule Take 1 capsule (60 mg total) by mouth daily. 10/15/22   Argentina Donovan, PA-C  megestrol (MEGACE) 40 MG tablet Take 1 tablet (40 mg total) by mouth 2 (two) times daily. 10/15/22   Argentina Donovan, PA-C  meloxicam (MOBIC) 15 MG tablet Take 1 tablet (15 mg total) by mouth daily for 1 week then as needed for pain. 10/15/22   Argentina Donovan, PA-C  oxyCODONE-acetaminophen (PERCOCET) 10-325 MG tablet Take 1 tablet by mouth 3 (three) times daily as needed for pain 09/11/22     oxyCODONE-acetaminophen (PERCOCET) 10-325 MG tablet Take 1 tablet by mouth 4 (four) times daily. 10/12/22       ROS: Neg HEENT Neg resp Neg cardiac Neg GI Neg psych Neg neuro  Objective:   Vitals:   10/15/22 1118  BP: (!) 143/89  Pulse: 90  SpO2: 97%  Weight: (!) 314 lb (142.4 kg)   Exam General appearance : Awake, alert, not in any distress. Speech Clear. Not toxic looking HEENT: Atraumatic and Normocephalic Neck: Supple, no JVD. No cervical lymphadenopathy.  Chest: Good air entry bilaterally, CTAB.  No rales/rhonchi/wheezing CVS: S1 S2 regular, no murmurs.  L knee exam limited by obesity.  Ligaments seem stable.  No effusion or erythema of joint Extremities: B/L Lower Ext shows no edema, both legs are warm to touch Neurology: Awake alert, and oriented X 3, CN II-XII intact, Non focal Skin: No Rash  Data Review Lab Results  Component Value Date   HGBA1C 6.1 10/15/2022   HGBA1C 6.0 05/03/2022    HGBA1C 6.1 (H) 09/19/2021    Assessment & Plan   1. Type 2 diabetes mellitus with morbid obesity (HCC) stable - Glucose (CBG) - HgB A1c - Comprehensive metabolic panel - Lipid panel - Dulaglutide (TRULICITY) 1.5 LZ/7.6BH SOPN; Inject 1.5 mg into the skin once a week.  Dispense: 2 mL; Refill: 5  2. DUB (dysfunctional uterine bleeding) - Ambulatory referral to Gynecology - megestrol (MEGACE) 40 MG tablet; Take 1 tablet (40 mg total) by mouth 2 (two) times daily.  Dispense: 60 tablet; Refill: 5  3. Low back pain with left-sided sciatica, unspecified back pain laterality, unspecified chronicity - meloxicam (MOBIC) 15 MG tablet; Take 1 tablet (  15 mg total) by mouth daily for 1 week then as needed for pain.  Dispense: 30 tablet; Refill: 3 - DULoxetine (CYMBALTA) 60 MG capsule; Take 1 capsule (60 mg total) by mouth daily.  Dispense: 30 capsule; Refill: 5  4. Depression, unspecified depression type stable - DULoxetine (CYMBALTA) 60 MG capsule; Take 1 capsule (60 mg total) by mouth daily.  Dispense: 30 capsule; Refill: 5  5. Left knee pain, unspecified chronicity Concern for intability - Ambulatory referral to Orthopedic Surgery  6. ? Postmenopausal bleeding - Ambulatory referral to Gynecology - CBC with Differential/Platelet - megestrol (MEGACE) 40 MG tablet; Take 1 tablet (40 mg total) by mouth 2 (two) times daily.  Dispense: 60 tablet; Refill: 5  Return in about 4 months (around 02/13/2023) for PCP for chronic conditions.  The patient was given clear instructions to go to ER or return to medical center if symptoms don't improve, worsen or new problems develop. The patient verbalized understanding. The patient was told to call to get lab results if they haven't heard anything in the next week.      Freeman Caldron, PA-C [facility] [place], [place]   10/15/2022, 11:50 AM

## 2022-10-16 LAB — CBC WITH DIFFERENTIAL/PLATELET
Basophils Absolute: 0 10*3/uL (ref 0.0–0.2)
Basos: 0 %
EOS (ABSOLUTE): 0.1 10*3/uL (ref 0.0–0.4)
Eos: 1 %
Hematocrit: 38.9 % (ref 34.0–46.6)
Hemoglobin: 12.2 g/dL (ref 11.1–15.9)
Immature Grans (Abs): 0 10*3/uL (ref 0.0–0.1)
Immature Granulocytes: 0 %
Lymphocytes Absolute: 4.4 10*3/uL — ABNORMAL HIGH (ref 0.7–3.1)
Lymphs: 45 %
MCH: 26.5 pg — ABNORMAL LOW (ref 26.6–33.0)
MCHC: 31.4 g/dL — ABNORMAL LOW (ref 31.5–35.7)
MCV: 84 fL (ref 79–97)
Monocytes Absolute: 0.6 10*3/uL (ref 0.1–0.9)
Monocytes: 6 %
Neutrophils Absolute: 4.6 10*3/uL (ref 1.4–7.0)
Neutrophils: 48 %
Platelets: 410 10*3/uL (ref 150–450)
RBC: 4.61 x10E6/uL (ref 3.77–5.28)
RDW: 14.1 % (ref 11.7–15.4)
WBC: 9.8 10*3/uL (ref 3.4–10.8)

## 2022-10-16 LAB — COMPREHENSIVE METABOLIC PANEL
ALT: 11 IU/L (ref 0–32)
AST: 12 IU/L (ref 0–40)
Albumin/Globulin Ratio: 1.6 (ref 1.2–2.2)
Albumin: 3.9 g/dL (ref 3.9–4.9)
Alkaline Phosphatase: 85 IU/L (ref 44–121)
BUN/Creatinine Ratio: 13 (ref 9–23)
BUN: 13 mg/dL (ref 6–24)
Bilirubin Total: <0.2 mg/dL (ref 0.0–1.2)
CO2: 23 mmol/L (ref 20–29)
Calcium: 9.1 mg/dL (ref 8.7–10.2)
Chloride: 103 mmol/L (ref 96–106)
Creatinine, Ser: 1 mg/dL (ref 0.57–1.00)
Globulin, Total: 2.4 g/dL (ref 1.5–4.5)
Glucose: 144 mg/dL — ABNORMAL HIGH (ref 70–99)
Potassium: 4.1 mmol/L (ref 3.5–5.2)
Sodium: 142 mmol/L (ref 134–144)
Total Protein: 6.3 g/dL (ref 6.0–8.5)
eGFR: 69 mL/min/{1.73_m2} (ref >59)

## 2022-10-16 LAB — LIPID PANEL
Chol/HDL Ratio: 4.4 ratio (ref 0.0–4.4)
Cholesterol, Total: 159 mg/dL (ref 100–199)
HDL: 36 mg/dL — ABNORMAL LOW (ref >39)
LDL Chol Calc (NIH): 108 mg/dL — ABNORMAL HIGH (ref 0–99)
Triglycerides: 78 mg/dL (ref 0–149)
VLDL Cholesterol Cal: 15 mg/dL (ref 5–40)

## 2022-10-17 ENCOUNTER — Ambulatory Visit: Payer: Commercial Managed Care - HMO | Admitting: Physician Assistant

## 2022-10-18 DIAGNOSIS — Z79899 Other long term (current) drug therapy: Secondary | ICD-10-CM | POA: Diagnosis not present

## 2022-10-22 ENCOUNTER — Other Ambulatory Visit: Payer: Self-pay

## 2022-11-01 ENCOUNTER — Other Ambulatory Visit: Payer: Self-pay

## 2022-11-01 ENCOUNTER — Other Ambulatory Visit: Payer: Self-pay | Admitting: Internal Medicine

## 2022-11-01 DIAGNOSIS — G4700 Insomnia, unspecified: Secondary | ICD-10-CM

## 2022-11-02 ENCOUNTER — Other Ambulatory Visit: Payer: Self-pay

## 2022-11-02 MED ORDER — TRAZODONE HCL 100 MG PO TABS
100.0000 mg | ORAL_TABLET | Freq: Every evening | ORAL | 0 refills | Status: DC | PRN
  Filled 2022-11-02 – 2022-11-12 (×2): qty 30, 30d supply, fill #0

## 2022-11-02 NOTE — Telephone Encounter (Signed)
Requested Prescriptions  Pending Prescriptions Disp Refills   traZODone (DESYREL) 100 MG tablet 30 tablet 0    Sig: Take 1 tablet (100 mg total) by mouth at bedtime as needed for sleep.     Psychiatry: Antidepressants - Serotonin Modulator Passed - 11/01/2022  3:49 PM      Passed - Valid encounter within last 6 months    Recent Outpatient Visits           2 weeks ago Type 2 diabetes mellitus with morbid obesity [facility])   [place], [place], [place]   6 months ago Type 2 diabetes mellitus with morbid obesity [facility])   [place], [place], [place]   8 months ago Type 2 diabetes mellitus with morbid obesity [facility])   [place], [place], [place]   1 year ago Moderate persistent asthma without complication   [place], Charlane Ferretti, [place]   1 year ago Type 2 diabetes mellitus with morbid obesity [facility])   [place], [place]       Future Appointments             In 3 months Wynetta Emery, Dalbert Batman, MD [place]

## 2022-11-08 ENCOUNTER — Other Ambulatory Visit: Payer: Self-pay

## 2022-11-12 ENCOUNTER — Other Ambulatory Visit: Payer: Self-pay

## 2022-11-12 ENCOUNTER — Other Ambulatory Visit (HOSPITAL_COMMUNITY): Payer: Self-pay

## 2022-11-12 DIAGNOSIS — Z79899 Other long term (current) drug therapy: Secondary | ICD-10-CM | POA: Diagnosis not present

## 2022-11-12 MED ORDER — OXYCODONE-ACETAMINOPHEN 10-325 MG PO TABS
1.0000 | ORAL_TABLET | Freq: Four times a day (QID) | ORAL | 0 refills | Status: DC
  Filled 2022-11-12: qty 120, 30d supply, fill #0

## 2022-11-14 ENCOUNTER — Other Ambulatory Visit: Payer: Self-pay

## 2022-11-14 DIAGNOSIS — Z79899 Other long term (current) drug therapy: Secondary | ICD-10-CM | POA: Diagnosis not present

## 2022-12-11 ENCOUNTER — Other Ambulatory Visit (HOSPITAL_COMMUNITY): Payer: Self-pay

## 2022-12-11 DIAGNOSIS — Z79899 Other long term (current) drug therapy: Secondary | ICD-10-CM | POA: Diagnosis not present

## 2022-12-11 MED ORDER — TRULICITY 1.5 MG/0.5ML ~~LOC~~ SOAJ
1.5000 mg | SUBCUTANEOUS | 0 refills | Status: DC
  Filled 2022-12-11: qty 2, 28d supply, fill #0

## 2022-12-11 MED ORDER — OXYCODONE-ACETAMINOPHEN 10-325 MG PO TABS
1.0000 | ORAL_TABLET | Freq: Four times a day (QID) | ORAL | 0 refills | Status: DC
  Filled 2022-12-11: qty 120, 30d supply, fill #0

## 2022-12-13 DIAGNOSIS — Z79899 Other long term (current) drug therapy: Secondary | ICD-10-CM | POA: Diagnosis not present

## 2022-12-14 ENCOUNTER — Other Ambulatory Visit: Payer: Self-pay

## 2022-12-14 ENCOUNTER — Other Ambulatory Visit: Payer: Self-pay | Admitting: Internal Medicine

## 2022-12-14 DIAGNOSIS — G4700 Insomnia, unspecified: Secondary | ICD-10-CM

## 2022-12-14 MED ORDER — TRAZODONE HCL 100 MG PO TABS
100.0000 mg | ORAL_TABLET | Freq: Every evening | ORAL | 0 refills | Status: DC | PRN
  Filled 2022-12-14: qty 30, 30d supply, fill #0
  Filled 2023-01-18: qty 30, 30d supply, fill #1

## 2022-12-14 NOTE — Telephone Encounter (Signed)
Requested Prescriptions  Pending Prescriptions Disp Refills   traZODone (DESYREL) 100 MG tablet 90 tablet 0    Sig: Take 1 tablet (100 mg total) by mouth at bedtime as needed for sleep.     Psychiatry: Antidepressants - Serotonin Modulator Passed - 12/14/2022  9:14 AM      Passed - Valid encounter within last 6 months    Recent Outpatient Visits           2 months ago Type 2 diabetes mellitus with morbid obesity [facility])   [place], [place], [place]   7 months ago Type 2 diabetes mellitus with morbid obesity [facility])   [place], [place], [place]   9 months ago Type 2 diabetes mellitus with morbid obesity [facility])   [place], [place], [place]   1 year ago Moderate persistent asthma without complication   [place], Charlane Ferretti, MD   1 year ago Type 2 diabetes mellitus with morbid obesity [facility])   [place], [place]       Future Appointments             In 2 months Wynetta Emery, Dalbert Batman, MD [place]

## 2023-01-11 ENCOUNTER — Other Ambulatory Visit (HOSPITAL_COMMUNITY): Payer: Self-pay

## 2023-01-11 DIAGNOSIS — Z79899 Other long term (current) drug therapy: Secondary | ICD-10-CM | POA: Diagnosis not present

## 2023-01-11 MED ORDER — TRULICITY 1.5 MG/0.5ML ~~LOC~~ SOAJ
1.5000 mg | SUBCUTANEOUS | 0 refills | Status: DC
  Filled 2023-01-11: qty 2, 28d supply, fill #0

## 2023-01-11 MED ORDER — OXYCODONE-ACETAMINOPHEN 10-325 MG PO TABS
1.0000 | ORAL_TABLET | Freq: Four times a day (QID) | ORAL | 0 refills | Status: DC
  Filled 2023-01-11: qty 120, 30d supply, fill #0

## 2023-01-14 DIAGNOSIS — Z79899 Other long term (current) drug therapy: Secondary | ICD-10-CM | POA: Diagnosis not present

## 2023-01-18 ENCOUNTER — Other Ambulatory Visit: Payer: Self-pay

## 2023-01-21 ENCOUNTER — Telehealth: Payer: Self-pay | Admitting: Pharmacist

## 2023-01-21 NOTE — Progress Notes (Signed)
Patient attempted to be outreached by Natalie Elchert, PharmD Candidate on 01/21/2023 to discuss hypertension. Left voicemail for patient to return our call at their convenience at [phone].   Natalie Elchert, PharmD Candidate   Catie T. Shelden Raborn, PharmD, BCACP, CPP [facility] [phone]  

## 2023-01-22 ENCOUNTER — Other Ambulatory Visit: Payer: Self-pay

## 2023-01-25 ENCOUNTER — Telehealth: Payer: Self-pay

## 2023-01-25 NOTE — Telephone Encounter (Signed)
Copied from CRM [id number]. Topic: General - Inquiry >> Jan 25, 2023  9:25 AM De Blanch wrote: Reason for CRM: Pt stated she is out of work waiting on disability. She stated that she is in a lot of pain and has been unable to work. Has been out of work for two years.  Pt asked if Dr.Johnson could possibly write her a letter detailing her medical conditions to be able to re-apply for her food stamps. Stated must be done before the 30th of this month.  Pt is requesting a new referral for orthopedics. She stated that she did not go in the past as she did not have insurance but now has Medicaid.  Please advice.

## 2023-01-28 ENCOUNTER — Other Ambulatory Visit: Payer: Self-pay

## 2023-01-28 MED ORDER — ALBUTEROL SULFATE HFA 108 (90 BASE) MCG/ACT IN AERS
1.0000 | INHALATION_SPRAY | RESPIRATORY_TRACT | 0 refills | Status: DC | PRN
  Filled 2023-01-28: qty 18, 16d supply, fill #0

## 2023-01-28 NOTE — Telephone Encounter (Signed)
Pt called back regarding requests below, requesting a lette for work

## 2023-01-30 NOTE — Telephone Encounter (Signed)
Called & spoke to the patient. Verified name & DOB. Schedule a telehealth appointment for 02/01/2023. Patient confirmed appointment.

## 2023-02-01 ENCOUNTER — Encounter: Payer: Self-pay | Admitting: Internal Medicine

## 2023-02-01 ENCOUNTER — Telehealth (HOSPITAL_BASED_OUTPATIENT_CLINIC_OR_DEPARTMENT_OTHER): Payer: Commercial Managed Care - HMO | Admitting: Internal Medicine

## 2023-02-01 DIAGNOSIS — M170 Bilateral primary osteoarthritis of knee: Secondary | ICD-10-CM | POA: Diagnosis not present

## 2023-02-01 NOTE — Progress Notes (Signed)
Patient ID: Hannah Baldwin, female   DOB: [birth year], 52 y.o.   MRN: [id number] Virtual Visit via Video Note  I connected with Raigen Jagielski Kass on 02/01/2023 at 8:11 AM by a video enabled telemedicine application and verified that I am speaking with the correct person using two identifiers.  Location: Patient: home Provider: Office   I discussed the limitations of evaluation and management by telemedicine and the availability of in person appointments. The patient expressed understanding and agreed to proceed.  History of Present Illness: Patient with history of DM 2 with neuropathy, obesity, mod persistent asthma, osteoarthritis of the left knee, iron deficiency anemia,DUB with fibroid, opioid use ds previously on Methadone.  I last saw the patient 08/2021.  Seen by our physician assistant 10/2022. Purpose of today's visit is to request a letter for her to continue to get food stamps. Patient with known osteoarthritis of both knees.  She reports that due to chronic pain issues with her knees and intermittent unexpected giving out of the left knee, she has not worked in the past 2 years.  She is afraid to walk at times because the left knee gives out.  Both knees hurt and pop.  Receiving pain management through [facility] pain clinic.  Referred to orthopedics 10/2022 by a physician assistant.  She no showed the appointment stating that she was not aware that she had regular Medicaid at the time.  She would like for this appointment to be rescheduled.  She has applied for disability and is working with a Clinical research associate.  Has her hearing 03/18/2023.   Outpatient Encounter Medications as of 02/01/2023  Medication Sig   acetaminophen (TYLENOL) 500 MG tablet Take 1,000 mg by mouth every 6 (six) hours as needed for moderate pain or headache.   albuterol (PROVENTIL) (2.5 MG/3ML) 0.083% nebulizer solution Take 3 mLs (2.5 mg total) by nebulization every 6 (six) hours as needed for wheezing or shortness of breath.    albuterol (VENTOLIN HFA) 108 (90 Base) MCG/ACT inhaler Inhale 2 puffs into the lungs every 4 hours as needed for wheezing or shortness of breath .   benzonatate (TESSALON) 100 MG capsule Take 1 capsule (100 mg total) by mouth 2 (two) times daily as needed for cough.   blood glucose meter kit and supplies KIT Dispense based on patient and insurance preference. Use up to four times daily as directed. (FOR ICD-9 250.00, 250.01).   Blood Glucose Monitoring Suppl (TRUE METRIX METER) w/Device KIT USE AS INSTRUCTED   cetirizine (ZYRTEC) 10 MG tablet Take 1 tablet (10 mg total) by mouth daily.   cyclobenzaprine (FLEXERIL) 5 MG tablet Take 1-2 tablets (5-10 mg total) by mouth 3 (three) times daily as needed for muscle spasms.   Dulaglutide (TRULICITY) 1.5 MG/0.5ML SOPN Inject 1.5 mg into the skin once a week.   Dulaglutide (TRULICITY) 1.5 MG/0.5ML SOPN Inject 1.5 mg into the skin once a week.   Dulaglutide (TRULICITY) 1.5 MG/0.5ML SOPN Inject 1.5 mg into the skin once a week.   DULoxetine (CYMBALTA) 60 MG capsule Take 1 capsule (60 mg total) by mouth daily.   fluconazole (DIFLUCAN) 150 MG tablet Take 1 tablet (150 mg total) by mouth every 30 (thirty) days.   furosemide (LASIX) 20 MG tablet Take 1 tablet (20 mg total) by mouth daily.   glucose blood (TRUE METRIX BLOOD GLUCOSE TEST) test strip Use as instructed   hydrochlorothiazide (HYDRODIURIL) 25 MG tablet Take 1 tablet (25 mg total) by mouth daily.   HYDROcodone-acetaminophen (NORCO/VICODIN)  5-325 MG tablet Take 1 tablet by mouth every 8 (eight) hours as needed for moderate pain.   lidocaine (LIDODERM) 5 % Place 1 patch onto the skin daily as needed. Apply patch to area most significant pain once per day.  Remove and discard patch within 12 hours of application.   losartan (COZAAR) 100 MG tablet Take 1 tablet (100 mg total) by mouth daily.   megestrol (MEGACE) 40 MG tablet Take 1 tablet (40 mg total) by mouth 2 (two) times daily.   meloxicam (MOBIC) 15 MG  tablet Take 1 tablet (15 mg total) by mouth daily for 1 week then as needed for pain.   metFORMIN (GLUCOPHAGE) 500 MG tablet Take 1 tablet (500 mg total) by mouth 2 (two) times daily.   mometasone-formoterol (DULERA) 100-5 MCG/ACT AERO Inhale 2 puffs into the lungs 2 (two) times daily.   oxyCODONE-acetaminophen (PERCOCET) 10-325 MG tablet Take 1 tablet by mouth 3 (three) times daily as needed for pain   oxyCODONE-acetaminophen (PERCOCET) 10-325 MG tablet Take 1 tablet by mouth 4 times daily.   pantoprazole (PROTONIX) 40 MG tablet Take 1 tablet (40 mg total) by mouth daily at 12 noon.   simethicone (MYLICON) 80 MG chewable tablet Chew 80-160 mg by mouth every 6 (six) hours as needed for flatulence.   traZODone (DESYREL) 100 MG tablet Take 1 tablet (100 mg total) by mouth at bedtime as needed for sleep.   TRUEPLUS LANCETS 28G MISC Check blood sugar TID & QHS   No facility-administered encounter medications on file as of 02/01/2023.        Observations/Objective: Middle-age African-American female in NAD  Assessment and Plan: 1. Primary osteoarthritis of both knees I will write letter for her in support of her getting her food stamps. Now that she has Medicaid, she is agreeable for [place] to resubmit the referral to orthopedics.  In the meantime we will prescribe a 4 pronged cane for her to use to help with gait stability. - Ambulatory referral to Orthopedics - For home use only DME Other see comment   Follow Up Instructions: Keep upcoming appointment with me in May for chronic disease management.   I discussed the assessment and treatment plan with the patient. The patient was provided an opportunity to ask questions and all were answered. The patient agreed with the plan and demonstrated an understanding of the instructions.   The patient was advised to call back or seek an in-person evaluation if the symptoms worsen or if the condition fails to improve as anticipated.  I spent 12 minutes  dedicated to the care of this patient on the date of this encounter to include previsit review of of chart, face-to-face time with patient discussing diagnosis and management and postvisit entering of orders.  This note has been created with Education officer, environmental. Any transcriptional errors are unintentional.  Jonah Blue, MD

## 2023-02-11 ENCOUNTER — Other Ambulatory Visit (HOSPITAL_COMMUNITY): Payer: Self-pay

## 2023-02-11 DIAGNOSIS — Z6841 Body Mass Index (BMI) 40.0 and over, adult: Secondary | ICD-10-CM | POA: Diagnosis not present

## 2023-02-11 DIAGNOSIS — M5416 Radiculopathy, lumbar region: Secondary | ICD-10-CM | POA: Diagnosis not present

## 2023-02-11 DIAGNOSIS — I10 Essential (primary) hypertension: Secondary | ICD-10-CM | POA: Diagnosis not present

## 2023-02-11 DIAGNOSIS — E119 Type 2 diabetes mellitus without complications: Secondary | ICD-10-CM | POA: Diagnosis not present

## 2023-02-11 DIAGNOSIS — Z79899 Other long term (current) drug therapy: Secondary | ICD-10-CM | POA: Diagnosis not present

## 2023-02-11 DIAGNOSIS — G8929 Other chronic pain: Secondary | ICD-10-CM | POA: Diagnosis not present

## 2023-02-11 MED ORDER — TRULICITY 1.5 MG/0.5ML ~~LOC~~ SOAJ
1.5000 mg | SUBCUTANEOUS | 0 refills | Status: DC
  Filled 2023-02-11: qty 2, 28d supply, fill #0

## 2023-02-11 MED ORDER — OXYCODONE-ACETAMINOPHEN 10-325 MG PO TABS
1.0000 | ORAL_TABLET | Freq: Four times a day (QID) | ORAL | 0 refills | Status: AC
  Filled 2023-02-11: qty 120, 30d supply, fill #0

## 2023-02-18 ENCOUNTER — Other Ambulatory Visit: Payer: Self-pay

## 2023-02-18 NOTE — Progress Notes (Signed)
   Hannah Baldwin [facility] 09/01/1971 [id number]  Pt interested in getting a BP cuff if covered by her insurance.   Patient outreached by Hannah Baldwin, PharmD Candidate on 02/18/23 while they were picking up prescriptions at [facility].  Blood Pressure Readings: Last documented ambulatory systolic blood pressure: 143 Last documented ambulatory diastolic blood pressure: 89 Does the patient have a validated home blood pressure machine?: No   Medication review was performed. Is the patient taking their medications as prescribed?: No (Pt states she takes whichever BP she has -- HCTZ or losartan, she is not sure as the doctor has been changing her meds)   The following barriers to adherence were noted: Does the patient have cost concerns?: No Does the patient have transportation concerns?: No Does the patient need assistance obtaining refills?: No Does the patient have questions or concerns about their medications?: No Does the patient have a follow up scheduled with their primary care provider/cardiologist?: Yes   Interventions: Interventions Completed: Medications were reviewed, Patient was educated on how to access home blood pressure machine  The patient has follow up scheduled:  PCP: Hannah Matar, MD   Hannah Baldwin, Student-PharmD

## 2023-02-19 ENCOUNTER — Ambulatory Visit: Payer: Commercial Managed Care - HMO | Admitting: Internal Medicine

## 2023-03-06 ENCOUNTER — Other Ambulatory Visit: Payer: Self-pay

## 2023-03-13 ENCOUNTER — Other Ambulatory Visit: Payer: Self-pay

## 2023-03-13 ENCOUNTER — Other Ambulatory Visit (HOSPITAL_COMMUNITY): Payer: Self-pay

## 2023-03-13 DIAGNOSIS — M5416 Radiculopathy, lumbar region: Secondary | ICD-10-CM | POA: Diagnosis not present

## 2023-03-13 DIAGNOSIS — Z6841 Body Mass Index (BMI) 40.0 and over, adult: Secondary | ICD-10-CM | POA: Diagnosis not present

## 2023-03-13 DIAGNOSIS — I10 Essential (primary) hypertension: Secondary | ICD-10-CM | POA: Diagnosis not present

## 2023-03-13 DIAGNOSIS — R030 Elevated blood-pressure reading, without diagnosis of hypertension: Secondary | ICD-10-CM | POA: Diagnosis not present

## 2023-03-13 DIAGNOSIS — G8929 Other chronic pain: Secondary | ICD-10-CM | POA: Diagnosis not present

## 2023-03-13 DIAGNOSIS — Z79899 Other long term (current) drug therapy: Secondary | ICD-10-CM | POA: Diagnosis not present

## 2023-03-13 DIAGNOSIS — E119 Type 2 diabetes mellitus without complications: Secondary | ICD-10-CM | POA: Diagnosis not present

## 2023-03-13 MED ORDER — OXYCODONE-ACETAMINOPHEN 10-325 MG PO TABS
ORAL_TABLET | ORAL | 0 refills | Status: AC
  Filled 2023-03-13: qty 120, 30d supply, fill #0

## 2023-03-18 ENCOUNTER — Other Ambulatory Visit (HOSPITAL_COMMUNITY): Payer: Self-pay

## 2023-03-18 DIAGNOSIS — Z79899 Other long term (current) drug therapy: Secondary | ICD-10-CM | POA: Diagnosis not present

## 2023-04-17 ENCOUNTER — Encounter: Payer: Self-pay | Admitting: Internal Medicine

## 2023-04-17 NOTE — Progress Notes (Signed)
I received a letter for him [facility] informing me that they received my referral but have not been able to reach this patient to schedule appt.

## 2023-04-18 ENCOUNTER — Other Ambulatory Visit (HOSPITAL_COMMUNITY): Payer: Self-pay

## 2023-04-18 DIAGNOSIS — Z6841 Body Mass Index (BMI) 40.0 and over, adult: Secondary | ICD-10-CM | POA: Diagnosis not present

## 2023-04-18 DIAGNOSIS — Z79899 Other long term (current) drug therapy: Secondary | ICD-10-CM | POA: Diagnosis not present

## 2023-04-18 DIAGNOSIS — E119 Type 2 diabetes mellitus without complications: Secondary | ICD-10-CM | POA: Diagnosis not present

## 2023-04-18 DIAGNOSIS — M5416 Radiculopathy, lumbar region: Secondary | ICD-10-CM | POA: Diagnosis not present

## 2023-04-18 DIAGNOSIS — I10 Essential (primary) hypertension: Secondary | ICD-10-CM | POA: Diagnosis not present

## 2023-04-18 MED ORDER — OXYCODONE-ACETAMINOPHEN 10-325 MG PO TABS
1.0000 | ORAL_TABLET | Freq: Four times a day (QID) | ORAL | 0 refills | Status: DC
Start: 2023-04-18 — End: 2023-05-16
  Filled 2023-04-18: qty 120, 30d supply, fill #0

## 2023-04-19 ENCOUNTER — Other Ambulatory Visit: Payer: Self-pay

## 2023-04-19 ENCOUNTER — Other Ambulatory Visit (HOSPITAL_COMMUNITY): Payer: Self-pay

## 2023-04-22 DIAGNOSIS — Z79899 Other long term (current) drug therapy: Secondary | ICD-10-CM | POA: Diagnosis not present

## 2023-04-29 ENCOUNTER — Telehealth: Payer: Self-pay

## 2023-04-29 DIAGNOSIS — F419 Anxiety disorder, unspecified: Secondary | ICD-10-CM

## 2023-04-29 NOTE — Telephone Encounter (Signed)
Copied from CRM [id number]. Topic: Referral - Status >> Apr 29, 2023 12:43 PM Macon Large wrote: Reason for CRM: Pt requests referral for psychiatry and/or psychology

## 2023-05-01 ENCOUNTER — Other Ambulatory Visit (HOSPITAL_COMMUNITY): Payer: Self-pay

## 2023-05-02 NOTE — Addendum Note (Signed)
Addended by: Jonah Blue B on: 05/02/2023 01:34 PM   Modules accepted: Orders

## 2023-05-02 NOTE — Telephone Encounter (Signed)
Called and spoke to the patient. Verified name and DOB. Patient stated that she has continuously ben more anxious, quick to anger, and depressed for quite some time. She stated that her disability has affected her mental health.

## 2023-05-02 NOTE — Telephone Encounter (Signed)
Called & spoke to the patient. Informed that referral was submitted for Hannah Baldwin. Patient expressed verbal understanding.

## 2023-05-07 NOTE — Telephone Encounter (Signed)
Noted  

## 2023-05-13 ENCOUNTER — Other Ambulatory Visit (HOSPITAL_COMMUNITY): Payer: Self-pay

## 2023-05-16 ENCOUNTER — Other Ambulatory Visit: Payer: Self-pay

## 2023-05-16 ENCOUNTER — Other Ambulatory Visit (HOSPITAL_COMMUNITY): Payer: Self-pay

## 2023-05-16 DIAGNOSIS — Z6841 Body Mass Index (BMI) 40.0 and over, adult: Secondary | ICD-10-CM | POA: Diagnosis not present

## 2023-05-16 DIAGNOSIS — M5416 Radiculopathy, lumbar region: Secondary | ICD-10-CM | POA: Diagnosis not present

## 2023-05-16 DIAGNOSIS — I10 Essential (primary) hypertension: Secondary | ICD-10-CM | POA: Diagnosis not present

## 2023-05-16 DIAGNOSIS — M5136 Other intervertebral disc degeneration, lumbar region: Secondary | ICD-10-CM | POA: Diagnosis not present

## 2023-05-16 DIAGNOSIS — Z79899 Other long term (current) drug therapy: Secondary | ICD-10-CM | POA: Diagnosis not present

## 2023-05-16 DIAGNOSIS — E119 Type 2 diabetes mellitus without complications: Secondary | ICD-10-CM | POA: Diagnosis not present

## 2023-05-16 MED ORDER — OXYCODONE-ACETAMINOPHEN 10-325 MG PO TABS
1.0000 | ORAL_TABLET | Freq: Four times a day (QID) | ORAL | 0 refills | Status: DC
  Filled 2023-05-16 (×2): qty 120, 30d supply, fill #0

## 2023-05-16 MED ORDER — TRULICITY 1.5 MG/0.5ML ~~LOC~~ SOAJ
1.5000 mg | SUBCUTANEOUS | 0 refills | Status: DC
  Filled 2023-05-16: qty 2, 28d supply, fill #0

## 2023-05-17 ENCOUNTER — Other Ambulatory Visit (HOSPITAL_COMMUNITY): Payer: Self-pay

## 2023-05-17 ENCOUNTER — Telehealth: Payer: Self-pay

## 2023-05-17 NOTE — Progress Notes (Signed)
Patient attempted to be outreached by Erasmo Leventhal, PharmD Candidate to discuss hypertension. Left voicemail for patient to return our call at their convenience at [phone]

## 2023-05-21 ENCOUNTER — Encounter: Payer: Self-pay | Admitting: Internal Medicine

## 2023-05-21 ENCOUNTER — Other Ambulatory Visit: Payer: Self-pay

## 2023-05-21 ENCOUNTER — Ambulatory Visit: Payer: Medicaid Other | Attending: Internal Medicine | Admitting: Internal Medicine

## 2023-05-21 ENCOUNTER — Other Ambulatory Visit: Payer: Self-pay | Admitting: Pharmacist

## 2023-05-21 DIAGNOSIS — E1159 Type 2 diabetes mellitus with other circulatory complications: Secondary | ICD-10-CM

## 2023-05-21 DIAGNOSIS — M170 Bilateral primary osteoarthritis of knee: Secondary | ICD-10-CM

## 2023-05-21 DIAGNOSIS — N6489 Other specified disorders of breast: Secondary | ICD-10-CM | POA: Diagnosis not present

## 2023-05-21 DIAGNOSIS — N938 Other specified abnormal uterine and vaginal bleeding: Secondary | ICD-10-CM | POA: Diagnosis not present

## 2023-05-21 DIAGNOSIS — E1169 Type 2 diabetes mellitus with other specified complication: Secondary | ICD-10-CM | POA: Diagnosis not present

## 2023-05-21 DIAGNOSIS — G4700 Insomnia, unspecified: Secondary | ICD-10-CM | POA: Diagnosis not present

## 2023-05-21 DIAGNOSIS — Z1231 Encounter for screening mammogram for malignant neoplasm of breast: Secondary | ICD-10-CM

## 2023-05-21 DIAGNOSIS — M25511 Pain in right shoulder: Secondary | ICD-10-CM | POA: Diagnosis not present

## 2023-05-21 DIAGNOSIS — J4540 Moderate persistent asthma, uncomplicated: Secondary | ICD-10-CM | POA: Diagnosis not present

## 2023-05-21 DIAGNOSIS — F419 Anxiety disorder, unspecified: Secondary | ICD-10-CM

## 2023-05-21 DIAGNOSIS — Z7985 Long-term (current) use of injectable non-insulin antidiabetic drugs: Secondary | ICD-10-CM

## 2023-05-21 DIAGNOSIS — G8929 Other chronic pain: Secondary | ICD-10-CM

## 2023-05-21 DIAGNOSIS — F32A Depression, unspecified: Secondary | ICD-10-CM

## 2023-05-21 DIAGNOSIS — M25512 Pain in left shoulder: Secondary | ICD-10-CM | POA: Diagnosis not present

## 2023-05-21 LAB — POCT GLYCOSYLATED HEMOGLOBIN (HGB A1C): HbA1c, POC (controlled diabetic range): 6.3 % (ref 0.0–7.0)

## 2023-05-21 MED ORDER — LOSARTAN POTASSIUM 25 MG PO TABS
25.0000 mg | ORAL_TABLET | Freq: Every day | ORAL | 1 refills | Status: DC
Start: 2023-05-21 — End: 2024-03-11
  Filled 2023-05-21: qty 90, 90d supply, fill #0
  Filled 2023-08-19: qty 90, 90d supply, fill #1

## 2023-05-21 MED ORDER — DULOXETINE HCL 60 MG PO CPEP
60.0000 mg | ORAL_CAPSULE | Freq: Every day | ORAL | 5 refills | Status: AC
Start: 2023-05-21 — End: ?
  Filled 2023-05-21: qty 30, 30d supply, fill #0
  Filled 2023-06-28: qty 30, 30d supply, fill #1
  Filled 2023-07-29: qty 30, 30d supply, fill #2
  Filled 2023-08-30: qty 30, 30d supply, fill #3
  Filled 2023-10-11: qty 30, 30d supply, fill #4
  Filled 2023-12-05: qty 30, 30d supply, fill #5

## 2023-05-21 MED ORDER — MELOXICAM 15 MG PO TABS
15.0000 mg | ORAL_TABLET | Freq: Every day | ORAL | 1 refills | Status: DC
Start: 2023-05-21 — End: 2023-12-31
  Filled 2023-05-21: qty 90, 90d supply, fill #0
  Filled 2023-08-19: qty 90, 90d supply, fill #1

## 2023-05-21 MED ORDER — TRAZODONE HCL 100 MG PO TABS
100.0000 mg | ORAL_TABLET | Freq: Every evening | ORAL | 1 refills | Status: DC | PRN
  Filled 2023-05-21: qty 90, 90d supply, fill #0
  Filled 2023-08-19: qty 90, 90d supply, fill #1

## 2023-05-21 MED ORDER — DULERA 100-5 MCG/ACT IN AERO
2.0000 | INHALATION_SPRAY | Freq: Two times a day (BID) | RESPIRATORY_TRACT | 12 refills | Status: AC
  Filled 2023-05-21: qty 13, 30d supply, fill #0
  Filled 2023-06-28: qty 13, 30d supply, fill #1
  Filled 2023-07-29: qty 13, 30d supply, fill #2
  Filled 2023-08-30: qty 13, 30d supply, fill #3
  Filled 2023-10-11: qty 13, 30d supply, fill #4

## 2023-05-21 MED ORDER — TRULICITY 1.5 MG/0.5ML ~~LOC~~ SOAJ
1.5000 mg | SUBCUTANEOUS | 4 refills | Status: DC
Start: 2023-05-21 — End: 2023-12-10
  Filled 2023-05-21: qty 2, 28d supply, fill #0
  Filled 2023-06-28: qty 2, 28d supply, fill #1
  Filled 2023-07-24: qty 2, 28d supply, fill #2
  Filled 2023-08-26: qty 2, 28d supply, fill #0

## 2023-05-21 MED ORDER — ALBUTEROL SULFATE HFA 108 (90 BASE) MCG/ACT IN AERS
2.0000 | INHALATION_SPRAY | RESPIRATORY_TRACT | 1 refills | Status: DC | PRN
  Filled 2023-05-21: qty 18, 17d supply, fill #0
  Filled 2023-08-30: qty 18, 17d supply, fill #1

## 2023-05-21 MED ORDER — ALBUTEROL SULFATE HFA 108 (90 BASE) MCG/ACT IN AERS
2.0000 | INHALATION_SPRAY | RESPIRATORY_TRACT | 1 refills | Status: DC | PRN
Start: 2023-05-21 — End: 2023-05-21
  Filled 2023-05-21: qty 6.7, 16d supply, fill #0

## 2023-05-21 MED ORDER — FUROSEMIDE 20 MG PO TABS
20.0000 mg | ORAL_TABLET | Freq: Every day | ORAL | 3 refills | Status: DC
Start: 2023-05-21 — End: 2023-10-11
  Filled 2023-05-21: qty 5, 5d supply, fill #0
  Filled 2023-06-28: qty 5, 5d supply, fill #1
  Filled 2023-07-29: qty 5, 5d supply, fill #2
  Filled 2023-08-30: qty 5, 5d supply, fill #3

## 2023-05-21 NOTE — Progress Notes (Signed)
Patient ID: Hannah Baldwin, female    DOB: October 05, 1971  MRN: [id number]  CC: Diabetes (DM & HTN f/u. Med refills -multiple duplicates/Requesting a handicap placard due to pain/Swelling on R ankle, tender x3-4 weeks/Yes to mammogram referral. )   Subjective: Hannah Baldwin is a 52 y.o. 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   OA knees:  I received a letter for him [facility] informing me that they received my referral but have not been able to reach this patient to schedule appt.  Pt reports they may have called but she does not answer #s she does not recognized.  Her pain specialist submit referral to them last wk so she hopes to get in  Beh Health: She had called several weeks ago requesting referral to behavioral health for anxiety, depression and anger issues.  We referred her to [facility].  Patient states she has not been called as yet.  I provided her with the phone number today so that she can call them.  DM/morbid obesity:  Lab Results  Component Value Date   HGBA1C 6.3 05/21/2023  Out of Trulicity for 2 mth due to insurance issues.  Last dose that she was on was the 1.5 mg once a week.  Tolerated med well.   Not taking Metformin Eats once a day but over eats when she does.  Drinks 5-6 cans or bottle of regular sodas a day HTN: Supposed to be on Cozaar 100 mg daily, HCTZ and furosemide.  She has been out of all medicines.  Requesting refills to be sent to the pharmacy.  Reports some edema around both ankles with soreness in the right ankle.  She does not use salt in her foods.  DUB:  still taking Megace BID.  If she does not take it, she gets heavy vaginal bleeding.  She has not seen gynecology in a while.  She is overdue for Pap smear.  Wants referral for  breast reduction surgery.  Complains of pain in both shoulders from having very large breasts.  Needs refill on trazodone for insomnia. Patient Active Problem List   Diagnosis Date Noted   Asthma exacerbation 09/19/2021   Hypokalemia 09/19/2021   Essential hypertension 02/26/2020   Muscle cramp 02/26/2020   Fibroid uterus 01/09/2018   Primary osteoarthritis of left knee 04/09/2017   Insomnia 11/03/2015   Onychomycosis of toenail 07/25/2015   Diabetic neuropathy (HCC) 07/25/2015   DUB (dysfunctional uterine bleeding) 02/03/2015   Diabetes type 2, controlled (HCC) 01/25/2015   Severe obesity (BMI >= 40) (HCC) 01/25/2015   Moderate persistent asthma 01/21/2015   Cardiomegaly 06/09/2014   Hypochromic microcytic anemia 06/08/2014   Former smoker 01/01/2013     Current Outpatient Medications on File Prior to Visit  Medication Sig Dispense Refill   acetaminophen (TYLENOL) 500 MG tablet Take 1,000 mg by mouth every 6 (six) hours as needed for moderate pain or headache.     albuterol (PROVENTIL) (2.5 MG/3ML) 0.083% nebulizer solution Take 3 mLs (2.5 mg total) by nebulization every 6 (six) hours as needed for wheezing or shortness of breath. 75 mL 12   blood glucose meter kit and supplies KIT Dispense based on patient and insurance preference. Use up to four times daily as directed. (FOR ICD-9 250.00, 250.01). 1 each 0  Blood Glucose Monitoring Suppl (TRUE METRIX METER) w/Device KIT USE AS INSTRUCTED 1 kit 0   cetirizine (ZYRTEC) 10 MG tablet Take 1 tablet (10 mg total) by mouth daily. 30 tablet 0   glucose blood (TRUE METRIX BLOOD GLUCOSE TEST) test strip Use as instructed 100 each 12   oxyCODONE-acetaminophen (PERCOCET) 10-325 MG tablet Take 1 tablet by mouth 3 (three) times daily as needed for pain 90 tablet 0   oxyCODONE-acetaminophen (PERCOCET) 10-325 MG tablet Take 1 tablet by mouth 4 (four) times daily. 120 tablet 0   oxyCODONE-acetaminophen (PERCOCET) 10-325 MG tablet Take 1 tablet by  mouth 4 times daily as needed 120 tablet 0   oxyCODONE-acetaminophen (PERCOCET) 10-325 MG tablet Take 1 tablet by mouth 4 (four) times daily. 120 tablet 0   TRUEPLUS LANCETS 28G MISC Check blood sugar TID & QHS 100 each 2   megestrol (MEGACE) 40 MG tablet Take 1 tablet (40 mg total) by mouth 2 (two) times daily. (Patient not taking: Reported on 05/21/2023) 60 tablet 5   No current facility-administered medications on file prior to visit.    No Known Allergies  Social History   Socioeconomic History   Marital status: Widowed    Spouse name: Not on file   Number of children: Not on file   Years of education: Not on file   Highest education level: Not on file  Occupational History   Not on file  Tobacco Use   Smoking status: Former    Current packs/day: 0.00    Average packs/day: 0.3 packs/day for 20.0 years (5.0 ttl pk-yrs)    Types: Cigarettes    Start date: 01/18/1995    Quit date: 01/18/2015    Years since quitting: 8.3   Smokeless tobacco: Never  Substance and Sexual Activity   Alcohol use: Yes    Comment: 01/20/2015 "dank some alcohol on my birthday, cookouts,  etc,;  maybe once/month"   Drug use: No   Sexual activity: Not Currently    Birth control/protection: None  Other Topics Concern   Not on file  Social History Narrative   Not on file   Social Determinants of Health   Financial Resource Strain: Not on file  Food Insecurity: Not on file  Transportation Needs: Not on file  Physical Activity: Not on file  Stress: Not on file  Social Connections: Not on file  Intimate Partner Violence: Not on file    Family History  Problem Relation Age of Onset   Asthma Mother    Asthma Daughter     Past Surgical History:  Procedure Laterality Date   CESAREAN SECTION  1992    ROS: Review of Systems Negative except as stated above  PHYSICAL EXAM: BP 135/85 (BP Location: Left Arm, Patient Position: Sitting, Cuff Size: Large)   Pulse 86   Ht 5\' 3"  (1.6 m)   Wt (!)  335 lb (152 kg)   SpO2 99%   BMI 59.34 kg/m   Physical Exam  General appearance - alert, well appearing, morbidly obese African-American female and in no distress Mental status - normal mood, behavior, speech, dress, motor activity, and thought processes Chest -mild scattered wheezing bilaterally. Heart - normal rate, regular rhythm, normal S1, S2, no murmurs, rubs, clicks or gallops Extremities: Trace to 1+ edema of the ankles      Latest Ref Rng & Units 10/15/2022   11:43 AM 09/25/2021   12:41 AM 09/22/2021   10:28 AM  CMP  Glucose 70 - 99 mg/dL 161  109  125   BUN 6 - 24 mg/dL 13  16  22    Creatinine 0.57 - 1.00 mg/dL 8.11  9.14  7.82   Sodium 134 - 144 mmol/L 142  137  138   Potassium 3.5 - 5.2 mmol/L 4.1  3.8  3.8   Chloride 96 - 106 mmol/L 103  101  103   CO2 20 - 29 mmol/L 23  31  31    Calcium 8.7 - 10.2 mg/dL 9.1  8.5  8.8   Total Protein 6.0 - 8.5 g/dL 6.3     Total Bilirubin 0.0 - 1.2 mg/dL <9.5     Alkaline Phos 44 - 121 IU/L 85     AST 0 - 40 IU/L 12     ALT 0 - 32 IU/L 11      Lipid Panel     Component Value Date/Time   CHOL 159 10/15/2022 1143   TRIG 78 10/15/2022 1143   HDL 36 (L) 10/15/2022 1143   CHOLHDL 4.4 10/15/2022 1143   CHOLHDL 3.4 01/20/2015 2241   VLDL 15 01/20/2015 2241   LDLCALC 108 (H) 10/15/2022 1143    CBC    Component Value Date/Time   WBC 9.8 10/15/2022 1143   WBC 20.9 (H) 09/25/2021 0041   RBC 4.61 10/15/2022 1143   RBC 3.93 09/25/2021 0041   HGB 12.2 10/15/2022 1143   HCT 38.9 10/15/2022 1143   PLT 410 10/15/2022 1143   MCV 84 10/15/2022 1143   MCH 26.5 (L) 10/15/2022 1143   MCH 26.5 09/25/2021 0041   MCHC 31.4 (L) 10/15/2022 1143   MCHC 30.1 09/25/2021 0041   RDW 14.1 10/15/2022 1143   LYMPHSABS 4.4 (H) 10/15/2022 1143   MONOABS 2.1 (H) 09/25/2021 0041   EOSABS 0.1 10/15/2022 1143   BASOSABS 0.0 10/15/2022 1143    ASSESSMENT AND PLAN:  1. Type 2 diabetes mellitus with morbid obesity (HCC) At goal. We will restart  Trulicity at the last dose to help bring about some weight loss.  Went over possible side effects of the medication including vomiting, pancreatitis, bowel blockage, diarrhea and constipation.  Advised to stop the medicine if she develops any abdominal pain, vomiting as these may be signs of pancreatitis/bowel blockage. - POCT glycosylated hemoglobin (Hb A1C) - POCT glucose (manual entry) - Microalbumin / creatinine urine ratio - Dulaglutide (TRULICITY) 1.5 MG/0.5ML SOPN; Inject 1.5 mg into the skin once a week.  Dispense: 2 mL; Refill: 4 - Ambulatory referral to Ophthalmology  2. Hypertension associated with type 2 diabetes mellitus (HCC) Since she has been off blood pressure medications for quite a while and blood pressure is close to goal, we will restart furosemide.  Restart Cozaar but at a lower dose. - furosemide (LASIX) 20 MG tablet; Take 1 tablet (20 mg total) by mouth daily.  Dispense: 5 tablet; Refill: 3 - losartan (COZAAR) 25 MG tablet; Take 1 tablet (25 mg total) by mouth daily.  Dispense: 90 tablet; Refill: 1  3. Moderate persistent asthma without complication - mometasone-formoterol (DULERA) 100-5 MCG/ACT AERO; Inhale 2 puffs into the lungs 2 (two) times daily.  Dispense: 13 g; Refill: 12  4. Anxiety and depression Patient given information for [facility] and advised to give them a call to schedule an appointment. - DULoxetine (CYMBALTA) 60 MG capsule; Take 1 capsule (60 mg total) by mouth daily.  Dispense: 30 capsule; Refill: 5  5. Primary osteoarthritis of both knees Encouraged her to look up [facility] on her phone and  get their phone number so that she can call and schedule an appointment - meloxicam (MOBIC) 15 MG tablet; Take 1 tablet (15 mg total) by mouth daily for 1 week then as needed for pain.  Dispense: 90 tablet; Refill: 1  6. DUB (dysfunctional uterine bleeding) We will get her in with gynecology for this and for Pap smear.  7. Insomnia, unspecified type -  traZODone (DESYREL) 100 MG tablet; Take 1 tablet (100 mg total) by mouth at bedtime as needed for sleep.  Dispense: 90 tablet; Refill: 1  8. Bilateral pendulous breasts - Ambulatory referral to Plastic Surgery  9. Chronic pain of both shoulders - Ambulatory referral to Plastic Surgery  10. Encounter for screening mammogram for malignant neoplasm of breast - MM Digital Screening; Future      Patient was given the opportunity to ask questions.  Patient verbalized understanding of the plan and was able to repeat key elements of the plan.   This documentation was completed using Paediatric nurse.  Any transcriptional errors are unintentional.  Orders Placed This Encounter  Procedures   MM Digital Screening   Microalbumin / creatinine urine ratio   Ambulatory referral to Ophthalmology   Ambulatory referral to Plastic Surgery   POCT glycosylated hemoglobin (Hb A1C)   POCT glucose (manual entry)     Requested Prescriptions   Signed Prescriptions Disp Refills   furosemide (LASIX) 20 MG tablet 5 tablet 3    Sig: Take 1 tablet (20 mg total) by mouth daily.   meloxicam (MOBIC) 15 MG tablet 90 tablet 1    Sig: Take 1 tablet (15 mg total) by mouth daily for 1 week then as needed for pain.   traZODone (DESYREL) 100 MG tablet 90 tablet 1    Sig: Take 1 tablet (100 mg total) by mouth at bedtime as needed for sleep.   DULoxetine (CYMBALTA) 60 MG capsule 30 capsule 5    Sig: Take 1 capsule (60 mg total) by mouth daily.   Dulaglutide (TRULICITY) 1.5 MG/0.5ML SOPN 2 mL 4    Sig: Inject 1.5 mg into the skin once a week.   mometasone-formoterol (DULERA) 100-5 MCG/ACT AERO 13 g 12    Sig: Inhale 2 puffs into the lungs 2 (two) times daily.   losartan (COZAAR) 25 MG tablet 90 tablet 1    Sig: Take 1 tablet (25 mg total) by mouth daily.    Return in about 4 months (around 09/20/2023).  Jonah Blue, MD, FACP

## 2023-05-21 NOTE — Patient Instructions (Signed)
[facility] [address]  [address] [place], [place] [address] Phone: [phone] Fax: [phone]

## 2023-05-22 ENCOUNTER — Other Ambulatory Visit: Payer: Self-pay

## 2023-05-23 ENCOUNTER — Other Ambulatory Visit: Payer: Self-pay

## 2023-05-27 ENCOUNTER — Other Ambulatory Visit: Payer: Self-pay

## 2023-05-28 ENCOUNTER — Other Ambulatory Visit: Payer: Self-pay

## 2023-05-29 ENCOUNTER — Other Ambulatory Visit: Payer: Self-pay

## 2023-05-29 ENCOUNTER — Other Ambulatory Visit: Payer: Self-pay | Admitting: Internal Medicine

## 2023-05-29 DIAGNOSIS — L304 Erythema intertrigo: Secondary | ICD-10-CM

## 2023-05-29 MED ORDER — FLUCONAZOLE 150 MG PO TABS
150.0000 mg | ORAL_TABLET | ORAL | 6 refills | Status: DC
Start: 2023-05-29 — End: 2024-08-18
  Filled 2023-05-29: qty 1, 30d supply, fill #0
  Filled 2023-06-28: qty 1, 30d supply, fill #1
  Filled 2023-07-29: qty 1, 30d supply, fill #2
  Filled 2024-02-20: qty 1, 30d supply, fill #3
  Filled 2024-03-17: qty 1, 30d supply, fill #4

## 2023-05-29 NOTE — Telephone Encounter (Signed)
Reminder letter mailed to the patient.

## 2023-05-30 ENCOUNTER — Telehealth: Payer: Self-pay | Admitting: Internal Medicine

## 2023-05-30 ENCOUNTER — Other Ambulatory Visit: Payer: Self-pay

## 2023-05-30 NOTE — Telephone Encounter (Signed)
Copied from CRM [id number]. Topic: General - Inquiry >> May 30, 2023  4:03 PM Patsy Lager T wrote: Reason for CRM: patient called in stated her handicap plaque application was not completed correctly and she needs to bring them back for provider to complete that part. She will bring the form back on tomorrow.

## 2023-05-30 NOTE — Telephone Encounter (Signed)
noted 

## 2023-05-31 NOTE — Telephone Encounter (Signed)
Noted  

## 2023-06-20 ENCOUNTER — Institutional Professional Consult (permissible substitution): Payer: Medicaid Other | Admitting: Plastic Surgery

## 2023-06-27 ENCOUNTER — Other Ambulatory Visit (HOSPITAL_COMMUNITY): Payer: Self-pay

## 2023-06-27 ENCOUNTER — Other Ambulatory Visit: Payer: Self-pay

## 2023-06-27 ENCOUNTER — Ambulatory Visit: Payer: Medicaid Other

## 2023-06-27 DIAGNOSIS — Z6841 Body Mass Index (BMI) 40.0 and over, adult: Secondary | ICD-10-CM | POA: Diagnosis not present

## 2023-06-27 DIAGNOSIS — E119 Type 2 diabetes mellitus without complications: Secondary | ICD-10-CM | POA: Diagnosis not present

## 2023-06-27 DIAGNOSIS — Z79899 Other long term (current) drug therapy: Secondary | ICD-10-CM | POA: Diagnosis not present

## 2023-06-27 DIAGNOSIS — I10 Essential (primary) hypertension: Secondary | ICD-10-CM | POA: Diagnosis not present

## 2023-06-27 DIAGNOSIS — M5416 Radiculopathy, lumbar region: Secondary | ICD-10-CM | POA: Diagnosis not present

## 2023-06-27 MED ORDER — OXYCODONE-ACETAMINOPHEN 10-325 MG PO TABS
1.0000 | ORAL_TABLET | Freq: Four times a day (QID) | ORAL | 0 refills | Status: DC
  Filled 2023-06-27: qty 120, 30d supply, fill #0

## 2023-06-28 ENCOUNTER — Telehealth: Payer: Self-pay

## 2023-06-28 ENCOUNTER — Other Ambulatory Visit: Payer: Self-pay

## 2023-06-28 NOTE — Telephone Encounter (Signed)
Pharmacy Patient Advocate Encounter   Received notification from CoverMyMeds that prior authorization for TRULICITY is required/requested.   Insurance verification completed.   The patient is insured through [facility] .   Per test claim: PA required; PA submitted to [facility] Medicaid via CoverMyMeds Key/confirmation #/EOC [id number] Status is pending

## 2023-07-01 ENCOUNTER — Telehealth: Payer: Self-pay

## 2023-07-01 ENCOUNTER — Other Ambulatory Visit: Payer: Self-pay

## 2023-07-01 NOTE — Telephone Encounter (Signed)
Pharmacy Patient Advocate Encounter  Received notification from [facility] that Prior Authorization for TRULICITY has been APPROVED from 06/28/2023 to 06/27/2024   PA #/Case ID/Reference #: [id number]

## 2023-07-02 DIAGNOSIS — Z79899 Other long term (current) drug therapy: Secondary | ICD-10-CM | POA: Diagnosis not present

## 2023-07-05 ENCOUNTER — Other Ambulatory Visit: Payer: Self-pay

## 2023-07-08 ENCOUNTER — Other Ambulatory Visit: Payer: Self-pay | Admitting: Physician Assistant

## 2023-07-08 ENCOUNTER — Other Ambulatory Visit: Payer: Self-pay

## 2023-07-08 DIAGNOSIS — N938 Other specified abnormal uterine and vaginal bleeding: Secondary | ICD-10-CM

## 2023-07-08 DIAGNOSIS — N950 Postmenopausal bleeding: Secondary | ICD-10-CM

## 2023-07-09 ENCOUNTER — Other Ambulatory Visit: Payer: Self-pay | Admitting: Internal Medicine

## 2023-07-09 ENCOUNTER — Other Ambulatory Visit: Payer: Self-pay

## 2023-07-09 DIAGNOSIS — N950 Postmenopausal bleeding: Secondary | ICD-10-CM

## 2023-07-09 DIAGNOSIS — N938 Other specified abnormal uterine and vaginal bleeding: Secondary | ICD-10-CM

## 2023-07-09 MED ORDER — MEGESTROL ACETATE 40 MG PO TABS
40.0000 mg | ORAL_TABLET | Freq: Two times a day (BID) | ORAL | 2 refills | Status: DC
Start: 2023-07-09 — End: 2023-10-11
  Filled 2023-07-09: qty 60, 30d supply, fill #0
  Filled 2023-08-12: qty 60, 30d supply, fill #1
  Filled 2023-09-17: qty 60, 30d supply, fill #2

## 2023-07-10 ENCOUNTER — Other Ambulatory Visit: Payer: Self-pay

## 2023-07-11 ENCOUNTER — Other Ambulatory Visit: Payer: Self-pay

## 2023-07-16 ENCOUNTER — Other Ambulatory Visit (HOSPITAL_COMMUNITY): Payer: Self-pay

## 2023-07-16 ENCOUNTER — Other Ambulatory Visit: Payer: Self-pay

## 2023-07-16 DIAGNOSIS — I10 Essential (primary) hypertension: Secondary | ICD-10-CM | POA: Diagnosis not present

## 2023-07-16 DIAGNOSIS — E7800 Pure hypercholesterolemia, unspecified: Secondary | ICD-10-CM | POA: Diagnosis not present

## 2023-07-16 DIAGNOSIS — M51369 Other intervertebral disc degeneration, lumbar region without mention of lumbar back pain or lower extremity pain: Secondary | ICD-10-CM | POA: Diagnosis not present

## 2023-07-16 DIAGNOSIS — R5383 Other fatigue: Secondary | ICD-10-CM | POA: Diagnosis not present

## 2023-07-16 DIAGNOSIS — Z79899 Other long term (current) drug therapy: Secondary | ICD-10-CM | POA: Diagnosis not present

## 2023-07-16 DIAGNOSIS — Z6841 Body Mass Index (BMI) 40.0 and over, adult: Secondary | ICD-10-CM | POA: Diagnosis not present

## 2023-07-16 DIAGNOSIS — E119 Type 2 diabetes mellitus without complications: Secondary | ICD-10-CM | POA: Diagnosis not present

## 2023-07-16 DIAGNOSIS — E559 Vitamin D deficiency, unspecified: Secondary | ICD-10-CM | POA: Diagnosis not present

## 2023-07-16 MED ORDER — OXYCODONE-ACETAMINOPHEN 10-325 MG PO TABS
1.0000 | ORAL_TABLET | Freq: Four times a day (QID) | ORAL | 0 refills | Status: AC
  Filled 2023-07-16 – 2023-07-25 (×3): qty 120, 30d supply, fill #0

## 2023-07-17 ENCOUNTER — Institutional Professional Consult (permissible substitution): Payer: Medicaid Other | Admitting: Plastic Surgery

## 2023-07-19 DIAGNOSIS — Z79899 Other long term (current) drug therapy: Secondary | ICD-10-CM | POA: Diagnosis not present

## 2023-07-24 ENCOUNTER — Other Ambulatory Visit: Payer: Self-pay

## 2023-07-24 ENCOUNTER — Other Ambulatory Visit (HOSPITAL_COMMUNITY): Payer: Self-pay

## 2023-07-25 ENCOUNTER — Other Ambulatory Visit (HOSPITAL_COMMUNITY): Payer: Self-pay

## 2023-07-25 ENCOUNTER — Other Ambulatory Visit: Payer: Self-pay

## 2023-07-29 ENCOUNTER — Other Ambulatory Visit: Payer: Self-pay

## 2023-08-12 ENCOUNTER — Other Ambulatory Visit: Payer: Self-pay

## 2023-08-19 ENCOUNTER — Other Ambulatory Visit: Payer: Self-pay

## 2023-08-22 ENCOUNTER — Institutional Professional Consult (permissible substitution): Payer: Medicaid Other | Admitting: Plastic Surgery

## 2023-08-26 ENCOUNTER — Other Ambulatory Visit: Payer: Self-pay

## 2023-08-26 ENCOUNTER — Other Ambulatory Visit (HOSPITAL_COMMUNITY): Payer: Self-pay

## 2023-08-26 DIAGNOSIS — G8929 Other chronic pain: Secondary | ICD-10-CM | POA: Diagnosis not present

## 2023-08-26 DIAGNOSIS — N911 Secondary amenorrhea: Secondary | ICD-10-CM | POA: Diagnosis not present

## 2023-08-26 DIAGNOSIS — M545 Low back pain, unspecified: Secondary | ICD-10-CM | POA: Diagnosis not present

## 2023-08-26 DIAGNOSIS — E119 Type 2 diabetes mellitus without complications: Secondary | ICD-10-CM | POA: Diagnosis not present

## 2023-08-26 DIAGNOSIS — Z79899 Other long term (current) drug therapy: Secondary | ICD-10-CM | POA: Diagnosis not present

## 2023-08-26 DIAGNOSIS — Z6841 Body Mass Index (BMI) 40.0 and over, adult: Secondary | ICD-10-CM | POA: Diagnosis not present

## 2023-08-26 MED ORDER — NALOXONE HCL 4 MG/0.1ML NA LIQD
1.0000 | NASAL | 0 refills | Status: AC
Start: 2023-08-26 — End: ?
  Filled 2023-08-26: qty 2, 15d supply, fill #0

## 2023-08-26 MED ORDER — TRULICITY 1.5 MG/0.5ML ~~LOC~~ SOAJ
1.5000 mg | SUBCUTANEOUS | 0 refills | Status: DC
  Filled 2023-08-26 – 2023-10-11 (×2): qty 2, 28d supply, fill #0

## 2023-08-26 MED ORDER — OXYCODONE HCL 10 MG PO TABS
10.0000 mg | ORAL_TABLET | Freq: Four times a day (QID) | ORAL | 0 refills | Status: DC | PRN
Start: 2023-08-26 — End: 2023-09-25
  Filled 2023-08-26 (×2): qty 120, 30d supply, fill #0

## 2023-08-28 DIAGNOSIS — Z79899 Other long term (current) drug therapy: Secondary | ICD-10-CM | POA: Diagnosis not present

## 2023-08-30 ENCOUNTER — Other Ambulatory Visit: Payer: Self-pay

## 2023-09-12 ENCOUNTER — Ambulatory Visit: Payer: Medicaid Other | Admitting: Plastic Surgery

## 2023-09-12 ENCOUNTER — Encounter: Payer: Self-pay | Admitting: Plastic Surgery

## 2023-09-12 VITALS — BP 146/87 | HR 91 | Ht 63.5 in | Wt 319.6 lb

## 2023-09-12 DIAGNOSIS — M546 Pain in thoracic spine: Secondary | ICD-10-CM | POA: Diagnosis not present

## 2023-09-12 DIAGNOSIS — M542 Cervicalgia: Secondary | ICD-10-CM

## 2023-09-12 DIAGNOSIS — F17210 Nicotine dependence, cigarettes, uncomplicated: Secondary | ICD-10-CM

## 2023-09-12 DIAGNOSIS — N62 Hypertrophy of breast: Secondary | ICD-10-CM | POA: Diagnosis not present

## 2023-09-12 DIAGNOSIS — Z6841 Body Mass Index (BMI) 40.0 and over, adult: Secondary | ICD-10-CM

## 2023-09-12 NOTE — Progress Notes (Signed)
Referring Provider Marcine Matar, MD [address] [place],  [place] [address]   CC:  Chief Complaint  Patient presents with   Consult      Hannah Baldwin is an 52 y.o. female.  HPI: [name] is a 52 year old female who presents today with complaints of upper back and neck pain and difficulty with daily activities due to the large size of her breast.  She is requesting a breast reduction.  Of note the patient currently has a BMI of 55 and is a smoker.  She has been working on weight loss and is currently on one of the weight loss medications Trulicity I believe  No Known Allergies  Outpatient Encounter Medications as of 09/12/2023  Medication Sig   acetaminophen (TYLENOL) 500 MG tablet Take 1,000 mg by mouth every 6 (six) hours as needed for moderate pain or headache.   albuterol (PROVENTIL) (2.5 MG/3ML) 0.083% nebulizer solution Take 3 mLs (2.5 mg total) by nebulization every 6 (six) hours as needed for wheezing or shortness of breath.   albuterol (VENTOLIN HFA) 108 (90 Base) MCG/ACT inhaler Inhale 2 puffs into the lungs every 4 hours as needed for wheezing or shortness of breath .   blood glucose meter kit and supplies KIT Dispense based on patient and insurance preference. Use up to four times daily as directed. (FOR ICD-9 250.00, 250.01).   Blood Glucose Monitoring Suppl (TRUE METRIX METER) w/Device KIT USE AS INSTRUCTED   cetirizine (ZYRTEC) 10 MG tablet Take 1 tablet (10 mg total) by mouth daily.   Dulaglutide (TRULICITY) 1.5 MG/0.5ML SOAJ Inject 1.5 mg into the skin once a week.   Dulaglutide (TRULICITY) 1.5 MG/0.5ML SOAJ Inject 1.5 mg into the skin once a week.   DULoxetine (CYMBALTA) 60 MG capsule Take 1 capsule (60 mg total) by mouth daily.   fluconazole (DIFLUCAN) 150 MG tablet Take 1 tablet (150 mg total) by mouth every 30 (thirty) days.   furosemide (LASIX) 20 MG tablet Take 1 tablet (20 mg total) by mouth daily.   glucose blood (TRUE METRIX BLOOD GLUCOSE  TEST) test strip Use as instructed   losartan (COZAAR) 25 MG tablet Take 1 tablet (25 mg total) by mouth daily.   megestrol (MEGACE) 40 MG tablet Take 1 tablet (40 mg total) by mouth 2 (two) times daily.   meloxicam (MOBIC) 15 MG tablet Take 1 tablet (15 mg total) by mouth daily for 1 week then as needed for pain.   mometasone-formoterol (DULERA) 100-5 MCG/ACT AERO Inhale 2 puffs into the lungs 2 (two) times daily.   naloxone (NARCAN) nasal spray 4 mg/0.1 mL Place 1 spray into the nose as directed.   Oxycodone HCl 10 MG TABS Take 1 tablet (10 mg total) by mouth 4 (four) times daily as needed.   oxyCODONE-acetaminophen (PERCOCET) 10-325 MG tablet Take 1 tablet by mouth 3 (three) times daily as needed for pain   oxyCODONE-acetaminophen (PERCOCET) 10-325 MG tablet Take 1 tablet by mouth 4 (four) times daily.   oxyCODONE-acetaminophen (PERCOCET) 10-325 MG tablet Take 1 tablet by mouth 4 times daily as needed   oxyCODONE-acetaminophen (PERCOCET) 10-325 MG tablet Take 1 tablet by mouth 4 (four) times daily.   traZODone (DESYREL) 100 MG tablet Take 1 tablet (100 mg total) by mouth at bedtime as needed for sleep.   TRUEPLUS LANCETS 28G MISC Check blood sugar TID & QHS   No facility-administered encounter medications on file as of 09/12/2023.     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    Pneumonia ~ 2000 X 1    Past Surgical History:  Procedure Laterality Date   CESAREAN SECTION  1992    Family History  Problem Relation Age of Onset   Asthma Mother    Asthma Daughter     Social History   Social History Narrative   Not on file     Review of Systems General: Denies fevers, chills, weight loss CV: Denies chest pain, shortness of breath, palpitations Breast: Large breasts which the patient feels are contributing to her upper back and neck  pain  Physical Exam    09/12/2023    9:09 AM 05/21/2023    2:08 PM 10/15/2022   11:18 AM  Vitals with BMI  Height 5' 3.5" 5\' 3"    Weight 319 lbs 10 oz 335 lbs 314 lbs  BMI 55.72 59.36   Systolic 146 135 161  Diastolic 87 85 89  Pulse 91 86 90    General:  No acute distress,  Alert and oriented, Non-Toxic, Normal speech and affect She is not examined on this visit Mammogram: No mammograms on record since 2017. Assessment/Plan Morbid obesity: Patient is currently too large for a breast reduction.  I discussed this with her at length.  We discussed a weight goal of 225 pounds.  We discussed several strategies for weight loss in addition to her medications which include switching her sweets to fruits and stopping soft drinks and replacing them with sparkling water.  She was offered a referral to the healthy weight and wellness program but stated that she is already scheduled to go in January.  Additionally the patient is a smoker.  Smoking is not absolute contraindication to surgery.  We discussed this.  She is going to contact her primary care doctor for assistance with smoking cessation.  She understands that she will have a nicotine test when and if she becomes a candidate for a breast reduction.  Will offer her a follow-up appointment in 5 months if she wishes.  30 minutes spent reviewing the patient's chart, discussing the reasons for delaying breast reduction, discussing strategies for weight loss, and documenting care.  Santiago Glad 09/12/2023, 9:27 AM

## 2023-09-17 ENCOUNTER — Other Ambulatory Visit: Payer: Self-pay

## 2023-09-25 ENCOUNTER — Other Ambulatory Visit (HOSPITAL_COMMUNITY): Payer: Self-pay

## 2023-09-25 DIAGNOSIS — E119 Type 2 diabetes mellitus without complications: Secondary | ICD-10-CM | POA: Diagnosis not present

## 2023-09-25 DIAGNOSIS — Z6841 Body Mass Index (BMI) 40.0 and over, adult: Secondary | ICD-10-CM | POA: Diagnosis not present

## 2023-09-25 DIAGNOSIS — Z79899 Other long term (current) drug therapy: Secondary | ICD-10-CM | POA: Diagnosis not present

## 2023-09-25 DIAGNOSIS — G8929 Other chronic pain: Secondary | ICD-10-CM | POA: Diagnosis not present

## 2023-09-25 DIAGNOSIS — F1410 Cocaine abuse, uncomplicated: Secondary | ICD-10-CM | POA: Diagnosis not present

## 2023-09-25 DIAGNOSIS — N911 Secondary amenorrhea: Secondary | ICD-10-CM | POA: Diagnosis not present

## 2023-09-25 DIAGNOSIS — M545 Low back pain, unspecified: Secondary | ICD-10-CM | POA: Diagnosis not present

## 2023-09-25 MED ORDER — OXYCODONE HCL 10 MG PO TABS
10.0000 mg | ORAL_TABLET | Freq: Three times a day (TID) | ORAL | 0 refills | Status: DC | PRN
Start: 2023-09-25 — End: 2023-10-11
  Filled 2023-09-25: qty 45, 15d supply, fill #0

## 2023-09-27 DIAGNOSIS — Z79899 Other long term (current) drug therapy: Secondary | ICD-10-CM | POA: Diagnosis not present

## 2023-10-11 ENCOUNTER — Other Ambulatory Visit: Payer: Self-pay | Admitting: Internal Medicine

## 2023-10-11 ENCOUNTER — Other Ambulatory Visit: Payer: Self-pay

## 2023-10-11 ENCOUNTER — Other Ambulatory Visit (HOSPITAL_COMMUNITY): Payer: Self-pay

## 2023-10-11 DIAGNOSIS — E119 Type 2 diabetes mellitus without complications: Secondary | ICD-10-CM | POA: Diagnosis not present

## 2023-10-11 DIAGNOSIS — I152 Hypertension secondary to endocrine disorders: Secondary | ICD-10-CM

## 2023-10-11 DIAGNOSIS — N938 Other specified abnormal uterine and vaginal bleeding: Secondary | ICD-10-CM

## 2023-10-11 DIAGNOSIS — Z79899 Other long term (current) drug therapy: Secondary | ICD-10-CM | POA: Diagnosis not present

## 2023-10-11 DIAGNOSIS — J4540 Moderate persistent asthma, uncomplicated: Secondary | ICD-10-CM

## 2023-10-11 DIAGNOSIS — N950 Postmenopausal bleeding: Secondary | ICD-10-CM

## 2023-10-11 DIAGNOSIS — M51369 Other intervertebral disc degeneration, lumbar region without mention of lumbar back pain or lower extremity pain: Secondary | ICD-10-CM | POA: Diagnosis not present

## 2023-10-11 MED ORDER — ALBUTEROL SULFATE HFA 108 (90 BASE) MCG/ACT IN AERS
2.0000 | INHALATION_SPRAY | RESPIRATORY_TRACT | 1 refills | Status: AC | PRN
  Filled 2023-10-11: qty 18, 17d supply, fill #0
  Filled 2024-09-14: qty 18, 16d supply, fill #1

## 2023-10-11 MED ORDER — FUROSEMIDE 20 MG PO TABS
20.0000 mg | ORAL_TABLET | Freq: Every day | ORAL | 3 refills | Status: AC
  Filled 2023-10-11 – 2024-06-12 (×2): qty 5, 5d supply, fill #0

## 2023-10-11 MED ORDER — OXYCODONE HCL 10 MG PO TABS
10.0000 mg | ORAL_TABLET | Freq: Three times a day (TID) | ORAL | 0 refills | Status: DC | PRN
  Filled 2023-10-11: qty 45, 15d supply, fill #0

## 2023-10-11 MED ORDER — TRULICITY 1.5 MG/0.5ML ~~LOC~~ SOAJ
1.5000 mg | SUBCUTANEOUS | 0 refills | Status: AC
  Filled 2023-11-26: qty 2, 28d supply, fill #0

## 2023-10-11 MED ORDER — MEGESTROL ACETATE 40 MG PO TABS
40.0000 mg | ORAL_TABLET | Freq: Two times a day (BID) | ORAL | 1 refills | Status: DC
  Filled 2023-10-11 – 2023-10-31 (×3): qty 60, 30d supply, fill #0
  Filled 2023-12-05: qty 60, 30d supply, fill #1

## 2023-10-12 ENCOUNTER — Other Ambulatory Visit: Payer: Self-pay

## 2023-10-14 ENCOUNTER — Other Ambulatory Visit: Payer: Self-pay

## 2023-10-15 ENCOUNTER — Other Ambulatory Visit (HOSPITAL_COMMUNITY): Payer: Self-pay

## 2023-10-15 DIAGNOSIS — Z79899 Other long term (current) drug therapy: Secondary | ICD-10-CM | POA: Diagnosis not present

## 2023-10-24 ENCOUNTER — Other Ambulatory Visit: Payer: Self-pay

## 2023-10-25 ENCOUNTER — Other Ambulatory Visit (HOSPITAL_COMMUNITY): Payer: Self-pay

## 2023-10-25 DIAGNOSIS — M5416 Radiculopathy, lumbar region: Secondary | ICD-10-CM | POA: Diagnosis not present

## 2023-10-25 DIAGNOSIS — M51369 Other intervertebral disc degeneration, lumbar region without mention of lumbar back pain or lower extremity pain: Secondary | ICD-10-CM | POA: Diagnosis not present

## 2023-10-25 DIAGNOSIS — E119 Type 2 diabetes mellitus without complications: Secondary | ICD-10-CM | POA: Diagnosis not present

## 2023-10-25 DIAGNOSIS — E559 Vitamin D deficiency, unspecified: Secondary | ICD-10-CM | POA: Diagnosis not present

## 2023-10-25 DIAGNOSIS — Z79899 Other long term (current) drug therapy: Secondary | ICD-10-CM | POA: Diagnosis not present

## 2023-10-25 DIAGNOSIS — R030 Elevated blood-pressure reading, without diagnosis of hypertension: Secondary | ICD-10-CM | POA: Diagnosis not present

## 2023-10-25 MED ORDER — VITAMIN D (ERGOCALCIFEROL) 1.25 MG (50000 UNIT) PO CAPS
50000.0000 [IU] | ORAL_CAPSULE | ORAL | 3 refills | Status: AC
  Filled 2023-10-25 – 2023-10-29 (×2): qty 12, 84d supply, fill #0
  Filled 2023-11-05: qty 4, 28d supply, fill #0
  Filled 2023-12-05: qty 4, 28d supply, fill #1
  Filled 2024-02-20: qty 4, 28d supply, fill #2
  Filled 2024-03-17: qty 4, 28d supply, fill #3
  Filled 2024-08-03: qty 4, 28d supply, fill #4
  Filled 2024-09-14: qty 4, 28d supply, fill #5

## 2023-10-25 MED ORDER — OXYCODONE HCL 10 MG PO TABS
10.0000 mg | ORAL_TABLET | Freq: Three times a day (TID) | ORAL | 0 refills | Status: DC | PRN
  Filled 2023-10-25 (×2): qty 90, 30d supply, fill #0

## 2023-10-29 ENCOUNTER — Other Ambulatory Visit (HOSPITAL_COMMUNITY): Payer: Self-pay

## 2023-10-29 DIAGNOSIS — Z79899 Other long term (current) drug therapy: Secondary | ICD-10-CM | POA: Diagnosis not present

## 2023-10-31 ENCOUNTER — Other Ambulatory Visit: Payer: Self-pay

## 2023-10-31 ENCOUNTER — Other Ambulatory Visit: Payer: Self-pay | Admitting: Internal Medicine

## 2023-10-31 DIAGNOSIS — G4700 Insomnia, unspecified: Secondary | ICD-10-CM

## 2023-10-31 MED ORDER — TRAZODONE HCL 100 MG PO TABS
100.0000 mg | ORAL_TABLET | Freq: Every evening | ORAL | 0 refills | Status: DC | PRN
  Filled 2023-10-31 – 2023-12-05 (×2): qty 90, 90d supply, fill #0

## 2023-10-31 NOTE — Telephone Encounter (Signed)
Requested Prescriptions  Pending Prescriptions Disp Refills   traZODone (DESYREL) 100 MG tablet 90 tablet 0    Sig: Take 1 tablet (100 mg total) by mouth at bedtime as needed for sleep.     Psychiatry: Antidepressants - Serotonin Modulator Passed - 10/31/2023  1:28 PM      Passed - Valid encounter within last 6 months    Recent Outpatient Visits           5 months ago Type 2 diabetes mellitus with morbid obesity (HCC)   [facility] [place]. [facility] Marcine Matar, MD   9 months ago Primary osteoarthritis of both knees   [facility] [place] Jonah Blue B, MD   1 year ago Type 2 diabetes mellitus with morbid obesity [facility])   [facility] [place]. [facility] [place], [place], [place]   1 year ago Type 2 diabetes mellitus with morbid obesity [facility])   [facility] - [facility] Dept Of [place]. [facility] [place], [place], [place]   1 year ago Type 2 diabetes mellitus with morbid obesity [facility])   [facility] - A Dept Of [place]. [facility] [place], [place], [place]

## 2023-11-04 ENCOUNTER — Other Ambulatory Visit (HOSPITAL_COMMUNITY): Payer: Self-pay

## 2023-11-05 ENCOUNTER — Other Ambulatory Visit (HOSPITAL_COMMUNITY): Payer: Self-pay

## 2023-11-05 ENCOUNTER — Other Ambulatory Visit: Payer: Self-pay

## 2023-11-06 ENCOUNTER — Other Ambulatory Visit: Payer: Self-pay

## 2023-11-26 ENCOUNTER — Ambulatory Visit: Payer: Medicaid Other | Admitting: Obstetrics & Gynecology

## 2023-11-26 ENCOUNTER — Other Ambulatory Visit (HOSPITAL_COMMUNITY): Payer: Self-pay

## 2023-11-26 DIAGNOSIS — M51369 Other intervertebral disc degeneration, lumbar region without mention of lumbar back pain or lower extremity pain: Secondary | ICD-10-CM | POA: Diagnosis not present

## 2023-11-26 DIAGNOSIS — Z79899 Other long term (current) drug therapy: Secondary | ICD-10-CM | POA: Diagnosis not present

## 2023-11-26 DIAGNOSIS — E119 Type 2 diabetes mellitus without complications: Secondary | ICD-10-CM | POA: Diagnosis not present

## 2023-11-26 DIAGNOSIS — G8929 Other chronic pain: Secondary | ICD-10-CM | POA: Diagnosis not present

## 2023-11-26 DIAGNOSIS — Z6841 Body Mass Index (BMI) 40.0 and over, adult: Secondary | ICD-10-CM | POA: Diagnosis not present

## 2023-11-26 DIAGNOSIS — M5416 Radiculopathy, lumbar region: Secondary | ICD-10-CM | POA: Diagnosis not present

## 2023-11-26 MED ORDER — OXYCODONE HCL 10 MG PO TABS
10.0000 mg | ORAL_TABLET | Freq: Three times a day (TID) | ORAL | 0 refills | Status: DC | PRN
  Filled 2023-11-26: qty 90, 30d supply, fill #0

## 2023-11-29 DIAGNOSIS — Z79899 Other long term (current) drug therapy: Secondary | ICD-10-CM | POA: Diagnosis not present

## 2023-12-05 ENCOUNTER — Other Ambulatory Visit: Payer: Self-pay | Admitting: Internal Medicine

## 2023-12-05 ENCOUNTER — Other Ambulatory Visit: Payer: Self-pay

## 2023-12-06 ENCOUNTER — Other Ambulatory Visit: Payer: Self-pay

## 2023-12-09 ENCOUNTER — Ambulatory Visit: Payer: Self-pay | Admitting: Internal Medicine

## 2023-12-09 ENCOUNTER — Other Ambulatory Visit: Payer: Self-pay

## 2023-12-09 NOTE — Telephone Encounter (Signed)
 Copied from CRM [id number]. Topic: Clinical - Red Word Triage >> Dec 09, 2023  1:14 PM Gaetano Hawthorne wrote: [facility] that prompted transfer to Nurse Triage: Patient mentioned that she has been having some difficulty breathing and shortness of breath - her inhalers don't seem to be working - Patient has Asthma.   Chief Complaint: Difficulty Breathing Symptoms: Cough, Dyspnea  Frequency: A week Pertinent Negatives: Patient denies chest pain, fever Disposition: [] ED /[] Urgent Care (no appt availability in office) / [] Appointment(In office/virtual)/ []  [place] Virtual [facility]/ [] Home Care/ [] Refused Recommended Disposition /[] [place] [facility]/ []  Follow-up with PCP Additional Notes: TB is being triaged for difficulty breathing. Inhalers do not seem to be providing any relief. The patient reports worsening symptoms at night time. Recommended Urgent Care and made in office appointment for tomorrow. Patient verbalized understanding and agreed to plan.    Reason for Disposition  [1] MILD difficulty breathing (e.g., minimal/no SOB at rest, SOB with walking, pulse <100) AND [2] NEW-onset or WORSE than normal  Answer Assessment - Initial Assessment Questions 1. RESPIRATORY STATUS: "Describe your breathing?" (e.g., wheezing, shortness of breath, unable to speak, severe coughing)      Shortness of breath, Wheezing  2. ONSET: "When did this breathing problem begin?"       A week or so  3. PATTERN "Does the difficult breathing come and go, or has it been constant since it started?"      Constant  4. SEVERITY: "How bad is your breathing?" (e.g., mild, moderate, severe)    - MILD: No SOB at rest, mild SOB with walking, speaks normally in sentences, can lie down, no retractions, pulse < 100.    - MODERATE: SOB at rest, SOB with minimal exertion and prefers to sit, cannot lie down flat, speaks in phrases, mild retractions, audible wheezing, pulse 100-120.    - SEVERE: Very SOB at rest, speaks in single words,  struggling to breathe, sitting hunched forward, retractions, pulse > 120      Moderate-Severe  5. RECURRENT SYMPTOM: "Have you had difficulty breathing before?" If Yes, ask: "When was the last time?" and "What happened that time?"      Yes, Hospitalization  6. CARDIAC HISTORY: "Do you have any history of heart disease?" (e.g., heart attack, angina, bypass surgery, angioplasty)       No  7. LUNG HISTORY: "Do you have any history of lung disease?"  (e.g., pulmonary embolus, asthma, emphysema)     Asthma  8. CAUSE: "What do you think is causing the breathing problem?"      Unsure  9. OTHER SYMPTOMS: "Do you have any other symptoms? (e.g., dizziness, runny nose, cough, chest pain, fever)     Coughing at night  10. O2 SATURATION MONITOR:  "Do you use an oxygen saturation monitor (pulse oximeter) at home?" If Yes, ask: "What is your reading (oxygen level) today?" "What is your usual oxygen saturation reading?" (e.g., 95%)       No, unable  11. PREGNANCY: "Is there any chance you are pregnant?" "When was your last menstrual period?"       No and no  12. TRAVEL: "Have you traveled out of the country in the last month?" (e.g., travel history, exposures)       No  Protocols used: Breathing Difficulty-A-AH

## 2023-12-09 NOTE — Telephone Encounter (Signed)
 FYI

## 2023-12-10 ENCOUNTER — Ambulatory Visit: Attending: Internal Medicine | Admitting: Internal Medicine

## 2023-12-10 ENCOUNTER — Other Ambulatory Visit: Payer: Self-pay

## 2023-12-10 ENCOUNTER — Encounter: Payer: Self-pay | Admitting: Internal Medicine

## 2023-12-10 VITALS — BP 123/81 | HR 94 | Temp 97.6°F | Ht 63.0 in | Wt 318.0 lb

## 2023-12-10 DIAGNOSIS — J4541 Moderate persistent asthma with (acute) exacerbation: Secondary | ICD-10-CM | POA: Diagnosis not present

## 2023-12-10 DIAGNOSIS — G5792 Unspecified mononeuropathy of left lower limb: Secondary | ICD-10-CM | POA: Diagnosis not present

## 2023-12-10 DIAGNOSIS — Z2821 Immunization not carried out because of patient refusal: Secondary | ICD-10-CM | POA: Diagnosis not present

## 2023-12-10 MED ORDER — PREDNISONE 20 MG PO TABS
ORAL_TABLET | ORAL | 1 refills | Status: AC
  Filled 2023-12-10: qty 6, 6d supply, fill #0

## 2023-12-10 MED ORDER — ALBUTEROL SULFATE (2.5 MG/3ML) 0.083% IN NEBU
2.5000 mg | INHALATION_SOLUTION | Freq: Four times a day (QID) | RESPIRATORY_TRACT | 1 refills | Status: AC | PRN
  Filled 2023-12-10: qty 150, 13d supply, fill #0

## 2023-12-10 MED ORDER — ALBUTEROL SULFATE (2.5 MG/3ML) 0.083% IN NEBU: 2.5000 mg | INHALATION_SOLUTION | Freq: Once | RESPIRATORY_TRACT | Status: AC

## 2023-12-10 NOTE — Progress Notes (Signed)
 Patient ID: Hannah Baldwin, female    DOB: 08/31/1971  MRN: [id number]  CC: Shortness of Breath (SOB, wheezing - worsening asthma X2 weeks/No to flu vax.)   Subjective: Hannah Baldwin is a 53 y.o. female who presents for chronic ds management. 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   Discussed the use of AI scribe software for clinical note transcription with the patient, who gave verbal consent to proceed.  History of Present Illness   The patient, with a history of asthma and diabetes, presents with an acute exacerbation of asthma. She reports increasing shortness of breath and wheezing over the past couple of weeks, which has worsened in the past week.  She had a few prednisone left which she took 2 weeks ago.  It had helped a little.  She had requested refill but it was denied since she had not been seen in a while.  She has been using her Dulera and albuterol inhalers more frequently than prescribed due to the severity of her symptoms. She also reports a dry cough and mild congestion, but denies fever or sore throat. She has not been around anyone who is sick and has not had any recent long distance travel. She has not received her flu shot and does not wish to receive it.  In addition to her asthma symptoms, she also reports a problem with her left foot. She describes it as feeling "funny" and "not natural," with numbness and tingling. She also reports that her foot sometimes "gets stuck." This has been ongoing for a couple of months. She has not had any injury to the foot. She has been taking vitamins for leg cramps, but is unsure if this is related to her foot symptoms. She suspects it may be related to her diabetes.        Patient Active Problem List   Diagnosis Date Noted   Asthma exacerbation 09/19/2021    Hypokalemia 09/19/2021   Essential hypertension 02/26/2020   Muscle cramp 02/26/2020   Fibroid uterus 01/09/2018   Primary osteoarthritis of left knee 04/09/2017   Insomnia 11/03/2015   Onychomycosis of toenail 07/25/2015   Diabetic neuropathy (HCC) 07/25/2015   DUB (dysfunctional uterine bleeding) 02/03/2015   Diabetes type 2, controlled (HCC) 01/25/2015   Severe obesity (BMI >= 40) (HCC) 01/25/2015   Moderate persistent asthma 01/21/2015   Cardiomegaly 06/09/2014   Hypochromic microcytic anemia 06/08/2014   Former smoker 01/01/2013     Current Outpatient Medications on File Prior to Visit  Medication Sig Dispense Refill   acetaminophen (TYLENOL) 500 MG tablet Take 1,000 mg by mouth every 6 (six) hours as needed for moderate pain or headache.     albuterol (VENTOLIN HFA) 108 (90 Base) MCG/ACT inhaler Inhale 2 puffs into the lungs every 4 hours as needed for wheezing or shortness of breath . 18 g 1   blood glucose meter kit and supplies KIT Dispense based on patient and insurance preference. Use up to four times daily as directed. (FOR ICD-9 250.00, 250.01). 1 each 0   Blood Glucose Monitoring Suppl (TRUE METRIX METER) w/Device KIT USE AS INSTRUCTED 1 kit 0   cetirizine (ZYRTEC) 10 MG tablet Take 1 tablet (10 mg total) by mouth daily. 30 tablet 0   Dulaglutide (TRULICITY) 1.5 MG/0.5ML SOAJ Inject 1.5 mg into the skin once a  week. 2 mL 0   DULoxetine (CYMBALTA) 60 MG capsule Take 1 capsule (60 mg total) by mouth daily. 30 capsule 5   fluconazole (DIFLUCAN) 150 MG tablet Take 1 tablet (150 mg total) by mouth every 30 (thirty) days. 1 tablet 6   furosemide (LASIX) 20 MG tablet Take 1 tablet (20 mg total) by mouth daily. 5 tablet 3   glucose blood (TRUE METRIX BLOOD GLUCOSE TEST) test strip Use as instructed 100 each 12   losartan (COZAAR) 25 MG tablet Take 1 tablet (25 mg total) by mouth daily. 90 tablet 1   megestrol (MEGACE) 40 MG tablet Take 1 tablet (40 mg total) by mouth 2 (two)  times daily. 60 tablet 1   meloxicam (MOBIC) 15 MG tablet Take 1 tablet (15 mg total) by mouth daily for 1 week then as needed for pain. 90 tablet 1   mometasone-formoterol (DULERA) 100-5 MCG/ACT AERO Inhale 2 puffs into the lungs 2 (two) times daily. 13 g 12   naloxone (NARCAN) nasal spray 4 mg/0.1 mL Place 1 spray into the nose as directed. 2 each 0   Oxycodone HCl 10 MG TABS Take 1 tablet (10 mg total) by mouth 3 (three) times daily as needed. 90 tablet 0   oxyCODONE-acetaminophen (PERCOCET) 10-325 MG tablet Take 1 tablet by mouth 4 (four) times daily. 120 tablet 0   oxyCODONE-acetaminophen (PERCOCET) 10-325 MG tablet Take 1 tablet by mouth 4 times daily as needed 120 tablet 0   oxyCODONE-acetaminophen (PERCOCET) 10-325 MG tablet Take 1 tablet by mouth 4 (four) times daily. 120 tablet 0   traZODone (DESYREL) 100 MG tablet Take 1 tablet (100 mg total) by mouth at bedtime as needed for sleep. 90 tablet 0   TRUEPLUS LANCETS 28G MISC Check blood sugar TID & QHS 100 each 2   Vitamin D, Ergocalciferol, (DRISDOL) 1.25 MG (50000 UNIT) CAPS capsule Take 1 capsule (50,000 Units total) by mouth once a week. 30 capsule 3   No current facility-administered medications on file prior to visit.    No Known Allergies  Social History   Socioeconomic History   Marital status: Widowed    Spouse name: Not on file   Number of children: Not on file   Years of education: Not on file   Highest education level: Not on file  Occupational History   Not on file  Tobacco Use   Smoking status: Former    Current packs/day: 0.00    Average packs/day: 0.3 packs/day for 20.0 years (5.0 ttl pk-yrs)    Types: Cigarettes    Start date: 01/18/1995    Quit date: 01/18/2015    Years since quitting: 8.8   Smokeless tobacco: Never  Substance and Sexual Activity   Alcohol use: Yes    Comment: 01/20/2015 "dank some alcohol on my birthday, cookouts,  etc,;  maybe once/month"   Drug use: No   Sexual activity: Not Currently     Birth control/protection: None  Other Topics Concern   Not on file  Social History Narrative   Not on file   Social Drivers of Health   Financial Resource Strain: Not on file  Food Insecurity: No Food Insecurity (12/10/2023)   Hunger Vital Sign    Worried About Running Out of Food in the Last Year: Never true    Ran Out of Food in the Last Year: Never true  Transportation Needs: No Transportation Needs (12/10/2023)   PRAPARE - Administrator, Civil Service (Medical): No  Lack of Transportation (Non-Medical): No  Physical Activity: Not on file  Stress: Not on file  Social Connections: Not on file  Intimate Partner Violence: Not At Risk (12/10/2023)   Humiliation, Afraid, Rape, and Kick questionnaire    Fear of Current or Ex-Partner: No    Emotionally Abused: No    Physically Abused: No    Sexually Abused: No    Family History  Problem Relation Age of Onset   Asthma Mother    Asthma Daughter     Past Surgical History:  Procedure Laterality Date   CESAREAN SECTION  1992    ROS: Review of Systems Negative except as stated above  PHYSICAL EXAM: BP 123/81 (BP Location: Left Arm, Patient Position: Sitting, Cuff Size: Normal)   Pulse 94   Temp 97.6 F (36.4 C) (Oral)   Ht 5\' 3"  (1.6 m)   Wt (!) 318 lb (144.2 kg)   SpO2 99%   BMI 56.33 kg/m   Physical Exam  General appearance - alert, well appearing, and in no distress Mental status - normal mood, behavior, speech, dress, motor activity, and thought processes Chest - clear to auscultation, no wheezes, rales or rhonchi, symmetric air entry Heart - normal rate, regular rhythm, normal S1, S2, no murmurs, rubs, clicks or gallops Ext:  no LE edema Neurological -left foot: Good dorsalis pedis and posterior tibialis pulses.  The foot is warm.  No cyanosis noted.  She has decreased sensation on leap exam on the dorsal and plantar surface of the foot.  Right foot: Good dorsalis pedis pulses and posterior tibialis  pulses.  DP exam normal.      Latest Ref Rng & Units 10/15/2022   11:43 AM 09/25/2021   12:41 AM 09/22/2021   10:28 AM  CMP  Glucose 70 - 99 mg/dL 161  096  045   BUN 6 - 24 mg/dL 13  16  22    Creatinine 0.57 - 1.00 mg/dL 4.09  8.11  9.14   Sodium 134 - 144 mmol/L 142  137  138   Potassium 3.5 - 5.2 mmol/L 4.1  3.8  3.8   Chloride 96 - 106 mmol/L 103  101  103   CO2 20 - 29 mmol/L 23  31  31    Calcium 8.7 - 10.2 mg/dL 9.1  8.5  8.8   Total Protein 6.0 - 8.5 g/dL 6.3     Total Bilirubin 0.0 - 1.2 mg/dL <7.8     Alkaline Phos 44 - 121 IU/L 85     AST 0 - 40 IU/L 12     ALT 0 - 32 IU/L 11      Lipid Panel     Component Value Date/Time   CHOL 159 10/15/2022 1143   TRIG 78 10/15/2022 1143   HDL 36 (L) 10/15/2022 1143   CHOLHDL 4.4 10/15/2022 1143   CHOLHDL 3.4 01/20/2015 2241   VLDL 15 01/20/2015 2241   LDLCALC 108 (H) 10/15/2022 1143    CBC    Component Value Date/Time   WBC 9.8 10/15/2022 1143   WBC 20.9 (H) 09/25/2021 0041   RBC 4.61 10/15/2022 1143   RBC 3.93 09/25/2021 0041   HGB 12.2 10/15/2022 1143   HCT 38.9 10/15/2022 1143   PLT 410 10/15/2022 1143   MCV 84 10/15/2022 1143   MCH 26.5 (L) 10/15/2022 1143   MCH 26.5 09/25/2021 0041   MCHC 31.4 (L) 10/15/2022 1143   MCHC 30.1 09/25/2021 0041   RDW 14.1 10/15/2022 1143  LYMPHSABS 4.4 (H) 10/15/2022 1143   MONOABS 2.1 (H) 09/25/2021 0041   EOSABS 0.1 10/15/2022 1143   BASOSABS 0.0 10/15/2022 1143    ASSESSMENT AND PLAN: 1. Moderate persistent asthma with exacerbation (Primary) Given a nebulizer treatment today here in the clinic. Will give a prednisone taper.  Refill given on nebulizer solution so she can use her nebulizer machine at home as needed.  Continue Dulera.  Advised to follow-up or be seen in the emergency room if any worsening of symptoms.. - predniSONE (DELTASONE) 20 MG tablet; 1.5 tabs PO daily x 2 days then 1 tab do x 2 days then 1/2 tab daily x 2 days  Dispense: 6 tablet; Refill: 1 -  albuterol (PROVENTIL) (2.5 MG/3ML) 0.083% nebulizer solution; Take 3 mLs (2.5 mg total) by nebulization every 6 (six) hours as needed for wheezing or shortness of breath.  Dispense: 150 mL; Refill: 1 - albuterol (PROVENTIL) (2.5 MG/3ML) 0.083% nebulizer solution 2.5 mg  2. Neuropathy of left foot -Could be due to diabetic neuropathy but usually it is bilateral.  Will check B12 to rule out B12 deficiency but this too is usually bilateral. - Vitamin B12 - Ambulatory referral to Neurology  3. Influenza vaccination declined   Patient was given the opportunity to ask questions.  Patient verbalized understanding of the plan and was able to repeat key elements of the plan.   This documentation was completed using Paediatric nurse.  Any transcriptional errors are unintentional.  Orders Placed This Encounter  Procedures   Vitamin B12   Ambulatory referral to Neurology     Requested Prescriptions   Signed Prescriptions Disp Refills   predniSONE (DELTASONE) 20 MG tablet 6 tablet 1    Sig: Take 1.5 tablets (30 mg total) by mouth daily for 2 days, THEN 1 tablet (20 mg total) daily for 2 days, THEN 0.5 tablets (10 mg total) daily for 2 days.   albuterol (PROVENTIL) (2.5 MG/3ML) 0.083% nebulizer solution 150 mL 1    Sig: Take 3 mLs (2.5 mg total) by nebulization every 6 (six) hours as needed for wheezing or shortness of breath.    Return in about 4 weeks (around 01/07/2024) for chronic ds management.  Jonah Blue, MD, FACP

## 2023-12-10 NOTE — Patient Instructions (Signed)
 I have sent prescription to pharmacy for Prednisone taper and Albuterol nebulizer solutions. Please follow-up with be seen in the emergency room if no improvement in your symptoms with these measures.  Continue the [facility] inhaler.

## 2023-12-11 LAB — VITAMIN B12: Vitamin B-12: 446 pg/mL (ref 232–1245)

## 2023-12-12 ENCOUNTER — Other Ambulatory Visit: Payer: Self-pay

## 2023-12-15 ENCOUNTER — Other Ambulatory Visit: Payer: Self-pay | Admitting: Internal Medicine

## 2023-12-15 MED ORDER — GABAPENTIN 300 MG PO CAPS
300.0000 mg | ORAL_CAPSULE | Freq: Every day | ORAL | 0 refills | Status: DC
  Filled 2023-12-15: qty 90, 90d supply, fill #0

## 2023-12-15 NOTE — Progress Notes (Signed)
 Let pt know that both [facility] and [facility] are both booked uout to June/July.  I suggest she calls them back and take the appt.  Will send prescription to her pharmacy for Gabapentin 300 mg to take at bedtime.  The medication can cause drowsiness.

## 2023-12-16 ENCOUNTER — Other Ambulatory Visit: Payer: Self-pay

## 2023-12-19 ENCOUNTER — Other Ambulatory Visit: Payer: Self-pay

## 2023-12-24 ENCOUNTER — Other Ambulatory Visit (HOSPITAL_COMMUNITY): Payer: Self-pay

## 2023-12-24 DIAGNOSIS — M549 Dorsalgia, unspecified: Secondary | ICD-10-CM | POA: Diagnosis not present

## 2023-12-24 DIAGNOSIS — E119 Type 2 diabetes mellitus without complications: Secondary | ICD-10-CM | POA: Diagnosis not present

## 2023-12-24 DIAGNOSIS — M51369 Other intervertebral disc degeneration, lumbar region without mention of lumbar back pain or lower extremity pain: Secondary | ICD-10-CM | POA: Diagnosis not present

## 2023-12-24 DIAGNOSIS — Z6841 Body Mass Index (BMI) 40.0 and over, adult: Secondary | ICD-10-CM | POA: Diagnosis not present

## 2023-12-24 DIAGNOSIS — I10 Essential (primary) hypertension: Secondary | ICD-10-CM | POA: Diagnosis not present

## 2023-12-24 DIAGNOSIS — Z1211 Encounter for screening for malignant neoplasm of colon: Secondary | ICD-10-CM | POA: Diagnosis not present

## 2023-12-24 DIAGNOSIS — Z9181 History of falling: Secondary | ICD-10-CM | POA: Diagnosis not present

## 2023-12-24 DIAGNOSIS — Z79899 Other long term (current) drug therapy: Secondary | ICD-10-CM | POA: Diagnosis not present

## 2023-12-24 DIAGNOSIS — Z780 Asymptomatic menopausal state: Secondary | ICD-10-CM | POA: Diagnosis not present

## 2023-12-24 DIAGNOSIS — F17210 Nicotine dependence, cigarettes, uncomplicated: Secondary | ICD-10-CM | POA: Diagnosis not present

## 2023-12-24 MED ORDER — OXYCODONE HCL 10 MG PO TABS
10.0000 mg | ORAL_TABLET | Freq: Three times a day (TID) | ORAL | 0 refills | Status: AC | PRN
  Filled 2023-12-24 (×2): qty 90, 30d supply, fill #0

## 2023-12-30 DIAGNOSIS — Z79899 Other long term (current) drug therapy: Secondary | ICD-10-CM | POA: Diagnosis not present

## 2023-12-31 ENCOUNTER — Other Ambulatory Visit: Payer: Self-pay | Admitting: Internal Medicine

## 2023-12-31 ENCOUNTER — Other Ambulatory Visit: Payer: Self-pay

## 2023-12-31 ENCOUNTER — Other Ambulatory Visit: Payer: Self-pay | Admitting: Family Medicine

## 2023-12-31 DIAGNOSIS — M170 Bilateral primary osteoarthritis of knee: Secondary | ICD-10-CM

## 2023-12-31 DIAGNOSIS — N938 Other specified abnormal uterine and vaginal bleeding: Secondary | ICD-10-CM

## 2023-12-31 DIAGNOSIS — N950 Postmenopausal bleeding: Secondary | ICD-10-CM

## 2023-12-31 NOTE — Telephone Encounter (Signed)
 Requested medication (s) are due for refill today - unsure  Requested medication (s) are on the active medication list -yes  Future visit scheduled -yes  Last refill: 10/11/23 #60 1RF  Notes to clinic: non delegated Rx  Requested Prescriptions  Pending Prescriptions Disp Refills   megestrol (MEGACE) 40 MG tablet 60 tablet 1    Sig: Take 1 tablet (40 mg total) by mouth 2 (two) times daily.     Not Delegated - OB/GYN:  Progestins - megestrol acetate Failed - 12/31/2023  3:36 PM      Failed - This refill cannot be delegated      Passed - Last BP in normal range    BP Readings from Last 1 Encounters:  12/10/23 123/81         Passed - Valid encounter within last 12 months    Recent Outpatient Visits           3 weeks ago Moderate persistent asthma with exacerbation   [facility] [place] [facility] [place]. [facility] Jonah Blue B, MD   7 months ago Type 2 diabetes mellitus with morbid obesity [facility])   [facility] - [facility] Dept Of [place]. [facility] Marcine Matar, MD   11 months ago Primary osteoarthritis of both knees   [facility] [place] - [facility] Dept Of [place]. [facility] Jonah Blue B, MD   1 year ago Type 2 diabetes mellitus with morbid obesity [facility])   [facility] - A Dept Of [place]. [facility] [place], [place], [place]   1 year ago Type 2 diabetes mellitus with morbid obesity [facility])   [facility] - A Dept Of [place]. [facility] [place], [place]zella [place], [place]       Future Appointments             In 1 month Laural Benes, Binnie Rail, MD [facility] [place] - A Dept Of [place] Bridegroom. [facility]               Requested Prescriptions  Pending Prescriptions Disp Refills   megestrol (MEGACE) 40 MG tablet 60 tablet 1    Sig: Take 1 tablet (40 mg total) by mouth 2 (two) times daily.     Not Delegated - OB/GYN:   Progestins - megestrol acetate Failed - 12/31/2023  3:36 PM      Failed - This refill cannot be delegated      Passed - Last BP in normal range    BP Readings from Last 1 Encounters:  12/10/23 123/81         Passed - Valid encounter within last 12 months    Recent Outpatient Visits           3 weeks ago Moderate persistent asthma with exacerbation   [facility] [place] [facility] Of [place]. [facility] Jonah Blue B, MD   7 months ago Type 2 diabetes mellitus with morbid obesity [facility])   [facility] - A Dept Of [place]. [facility] Marcine Matar, MD   11 months ago Primary osteoarthritis of both knees   [facility] [facility] [place] [facility] Dept Of [place]. [facility] Jonah Blue B, MD   1 year ago Type 2 diabetes mellitus with morbid obesity (HCC)   [facility] [place] [facility] Dept Of [place]. [facility] [place], [place], [place]   1 year ago Type 2 diabetes mellitus with morbid obesity [facility])   [place] [facility] - A Dept Of [place]. [facility] [place], [place], [place]       Future Appointments             In 1 month Laural Benes, Binnie Rail, MD [facility] [place] - A Dept Of [place] Bridegroom. [facility]

## 2024-01-01 ENCOUNTER — Other Ambulatory Visit: Payer: Self-pay

## 2024-01-01 MED ORDER — MEGESTROL ACETATE 40 MG PO TABS
40.0000 mg | ORAL_TABLET | Freq: Two times a day (BID) | ORAL | 0 refills | Status: DC
  Filled 2024-01-01 – 2024-02-20 (×3): qty 60, 30d supply, fill #0

## 2024-01-01 MED ORDER — MELOXICAM 15 MG PO TABS
15.0000 mg | ORAL_TABLET | Freq: Every day | ORAL | 0 refills | Status: AC
  Filled 2024-01-01: qty 90, 90d supply, fill #0

## 2024-01-10 ENCOUNTER — Other Ambulatory Visit: Payer: Self-pay

## 2024-01-22 ENCOUNTER — Other Ambulatory Visit (HOSPITAL_COMMUNITY): Payer: Self-pay

## 2024-01-22 DIAGNOSIS — E119 Type 2 diabetes mellitus without complications: Secondary | ICD-10-CM | POA: Diagnosis not present

## 2024-01-22 DIAGNOSIS — Z79899 Other long term (current) drug therapy: Secondary | ICD-10-CM | POA: Diagnosis not present

## 2024-01-22 DIAGNOSIS — E7800 Pure hypercholesterolemia, unspecified: Secondary | ICD-10-CM | POA: Diagnosis not present

## 2024-01-22 DIAGNOSIS — M129 Arthropathy, unspecified: Secondary | ICD-10-CM | POA: Diagnosis not present

## 2024-01-22 DIAGNOSIS — E559 Vitamin D deficiency, unspecified: Secondary | ICD-10-CM | POA: Diagnosis not present

## 2024-01-22 DIAGNOSIS — Z1159 Encounter for screening for other viral diseases: Secondary | ICD-10-CM | POA: Diagnosis not present

## 2024-01-22 DIAGNOSIS — M549 Dorsalgia, unspecified: Secondary | ICD-10-CM | POA: Diagnosis not present

## 2024-01-22 DIAGNOSIS — R5383 Other fatigue: Secondary | ICD-10-CM | POA: Diagnosis not present

## 2024-01-22 DIAGNOSIS — R0602 Shortness of breath: Secondary | ICD-10-CM | POA: Diagnosis not present

## 2024-01-22 DIAGNOSIS — D539 Nutritional anemia, unspecified: Secondary | ICD-10-CM | POA: Diagnosis not present

## 2024-01-22 MED ORDER — OXYCODONE HCL 10 MG PO TABS
ORAL_TABLET | ORAL | 0 refills | Status: AC
  Filled 2024-01-22: qty 90, 30d supply, fill #0

## 2024-01-22 MED ORDER — NALOXONE HCL 4 MG/0.1ML NA LIQD
NASAL | 0 refills | Status: AC
  Filled 2024-01-22: qty 2, 2d supply, fill #0

## 2024-01-27 DIAGNOSIS — Z79899 Other long term (current) drug therapy: Secondary | ICD-10-CM | POA: Diagnosis not present

## 2024-01-31 ENCOUNTER — Ambulatory Visit: Admitting: Internal Medicine

## 2024-02-10 ENCOUNTER — Ambulatory Visit: Payer: Medicaid Other | Admitting: Student

## 2024-02-18 ENCOUNTER — Ambulatory Visit: Admitting: Student

## 2024-02-18 NOTE — Progress Notes (Deleted)
   Referring Provider Lawrance Presume, MD [address] [place],  [place] [address]   CC: No chief complaint on file.     Hannah Baldwin is an 53 y.o. female.  HPI: Patient is a 53 year old female with history of macromastia.  She was seen for initial consult by Dr. Carolynne Citron on 09/12/2023.  At this visit, patient complained of upper back and neck pain due to the large size of her breasts.  Patient was requesting a breast reduction.  Patient's BMI at this visit was 55 and patient was a smoker.  Patient had reported she was working on weight loss and was on a medication to help with weight loss.  It was discussed with the patient that she would need to lose weight in order to move forward with a breast reduction.  She was offered a referral to healthy weight and wellness, but reported that she had already scheduled to go there in January.  It was also discussed with the patient that she would need to stop smoking.  She reported she was going to contact her PCP to see if they could help her with smoking cessation.  Today,  Review of Systems General: ***  Physical Exam    12/10/2023    8:56 AM 09/12/2023    9:09 AM 05/21/2023    2:08 PM  Vitals with BMI  Height 5\' 3"  5' 3.5" 5\' 3"   Weight 318 lbs 319 lbs 10 oz 335 lbs  BMI 56.35 55.72 59.36  Systolic 123 146 191  Diastolic 81 87 85  Pulse 94 91 86    General:  No acute distress,  Alert and oriented, Non-Toxic, Normal speech and affect ***   Assessment/Plan ***  Harden Leyden 02/18/2024, 8:46 AM

## 2024-02-20 ENCOUNTER — Other Ambulatory Visit: Payer: Self-pay

## 2024-02-20 ENCOUNTER — Other Ambulatory Visit (HOSPITAL_COMMUNITY): Payer: Self-pay

## 2024-02-20 ENCOUNTER — Other Ambulatory Visit: Payer: Self-pay | Admitting: Internal Medicine

## 2024-02-20 DIAGNOSIS — D539 Nutritional anemia, unspecified: Secondary | ICD-10-CM | POA: Diagnosis not present

## 2024-02-20 DIAGNOSIS — M129 Arthropathy, unspecified: Secondary | ICD-10-CM | POA: Diagnosis not present

## 2024-02-20 DIAGNOSIS — E559 Vitamin D deficiency, unspecified: Secondary | ICD-10-CM | POA: Diagnosis not present

## 2024-02-20 DIAGNOSIS — Z1159 Encounter for screening for other viral diseases: Secondary | ICD-10-CM | POA: Diagnosis not present

## 2024-02-20 DIAGNOSIS — Z79899 Other long term (current) drug therapy: Secondary | ICD-10-CM | POA: Diagnosis not present

## 2024-02-20 DIAGNOSIS — E7800 Pure hypercholesterolemia, unspecified: Secondary | ICD-10-CM | POA: Diagnosis not present

## 2024-02-20 DIAGNOSIS — R0602 Shortness of breath: Secondary | ICD-10-CM | POA: Diagnosis not present

## 2024-02-20 DIAGNOSIS — M549 Dorsalgia, unspecified: Secondary | ICD-10-CM | POA: Diagnosis not present

## 2024-02-20 DIAGNOSIS — R5383 Other fatigue: Secondary | ICD-10-CM | POA: Diagnosis not present

## 2024-02-20 DIAGNOSIS — E119 Type 2 diabetes mellitus without complications: Secondary | ICD-10-CM | POA: Diagnosis not present

## 2024-02-20 MED ORDER — GABAPENTIN 300 MG PO CAPS
300.0000 mg | ORAL_CAPSULE | Freq: Every day | ORAL | 1 refills | Status: DC
  Filled 2024-02-20 – 2024-03-17 (×2): qty 90, 90d supply, fill #0
  Filled 2024-06-15: qty 90, 90d supply, fill #1

## 2024-02-20 MED ORDER — NALOXONE HCL 4 MG/0.1ML NA LIQD
1.0000 | NASAL | 0 refills | Status: AC
  Filled 2024-02-20: qty 2, 30d supply, fill #0

## 2024-02-20 MED ORDER — OXYCODONE HCL 10 MG PO TABS
10.0000 mg | ORAL_TABLET | Freq: Three times a day (TID) | ORAL | 0 refills | Status: DC | PRN
  Filled 2024-02-20: qty 90, 30d supply, fill #0

## 2024-02-21 ENCOUNTER — Other Ambulatory Visit: Payer: Self-pay

## 2024-02-21 DIAGNOSIS — M549 Dorsalgia, unspecified: Secondary | ICD-10-CM | POA: Diagnosis not present

## 2024-02-21 DIAGNOSIS — R0602 Shortness of breath: Secondary | ICD-10-CM | POA: Diagnosis not present

## 2024-02-26 DIAGNOSIS — Z79899 Other long term (current) drug therapy: Secondary | ICD-10-CM | POA: Diagnosis not present

## 2024-02-27 ENCOUNTER — Ambulatory Visit: Attending: Internal Medicine | Admitting: Internal Medicine

## 2024-03-03 ENCOUNTER — Other Ambulatory Visit: Payer: Self-pay

## 2024-03-03 ENCOUNTER — Other Ambulatory Visit: Payer: Self-pay | Admitting: Internal Medicine

## 2024-03-03 DIAGNOSIS — I10 Essential (primary) hypertension: Secondary | ICD-10-CM

## 2024-03-04 ENCOUNTER — Other Ambulatory Visit: Payer: Self-pay

## 2024-03-11 ENCOUNTER — Other Ambulatory Visit: Payer: Self-pay

## 2024-03-11 ENCOUNTER — Other Ambulatory Visit: Payer: Self-pay | Admitting: Internal Medicine

## 2024-03-11 ENCOUNTER — Other Ambulatory Visit: Payer: Self-pay | Admitting: Pharmacist

## 2024-03-11 DIAGNOSIS — E1159 Type 2 diabetes mellitus with other circulatory complications: Secondary | ICD-10-CM

## 2024-03-11 DIAGNOSIS — G4700 Insomnia, unspecified: Secondary | ICD-10-CM

## 2024-03-11 MED ORDER — LOSARTAN POTASSIUM 25 MG PO TABS
25.0000 mg | ORAL_TABLET | Freq: Every day | ORAL | 0 refills | Status: DC
  Filled 2024-03-11: qty 30, 30d supply, fill #0

## 2024-03-12 ENCOUNTER — Other Ambulatory Visit: Payer: Self-pay

## 2024-03-13 ENCOUNTER — Other Ambulatory Visit: Payer: Self-pay

## 2024-03-13 ENCOUNTER — Other Ambulatory Visit: Payer: Self-pay | Admitting: Internal Medicine

## 2024-03-13 DIAGNOSIS — G4700 Insomnia, unspecified: Secondary | ICD-10-CM

## 2024-03-17 ENCOUNTER — Other Ambulatory Visit: Payer: Self-pay

## 2024-03-18 ENCOUNTER — Other Ambulatory Visit: Payer: Self-pay

## 2024-03-23 ENCOUNTER — Other Ambulatory Visit (HOSPITAL_COMMUNITY): Payer: Self-pay

## 2024-03-23 DIAGNOSIS — D500 Iron deficiency anemia secondary to blood loss (chronic): Secondary | ICD-10-CM | POA: Diagnosis not present

## 2024-03-23 DIAGNOSIS — R809 Proteinuria, unspecified: Secondary | ICD-10-CM | POA: Diagnosis not present

## 2024-03-23 DIAGNOSIS — Z79899 Other long term (current) drug therapy: Secondary | ICD-10-CM | POA: Diagnosis not present

## 2024-03-23 DIAGNOSIS — R0602 Shortness of breath: Secondary | ICD-10-CM | POA: Diagnosis not present

## 2024-03-23 DIAGNOSIS — E1129 Type 2 diabetes mellitus with other diabetic kidney complication: Secondary | ICD-10-CM | POA: Diagnosis not present

## 2024-03-23 DIAGNOSIS — E119 Type 2 diabetes mellitus without complications: Secondary | ICD-10-CM | POA: Diagnosis not present

## 2024-03-23 DIAGNOSIS — D259 Leiomyoma of uterus, unspecified: Secondary | ICD-10-CM | POA: Diagnosis not present

## 2024-03-23 DIAGNOSIS — R002 Palpitations: Secondary | ICD-10-CM | POA: Diagnosis not present

## 2024-03-23 DIAGNOSIS — M4300 Spondylolysis, site unspecified: Secondary | ICD-10-CM | POA: Diagnosis not present

## 2024-03-23 DIAGNOSIS — E538 Deficiency of other specified B group vitamins: Secondary | ICD-10-CM | POA: Diagnosis not present

## 2024-03-23 MED ORDER — WEGOVY 0.25 MG/0.5ML ~~LOC~~ SOAJ
0.2500 mg | SUBCUTANEOUS | 0 refills | Status: AC
  Filled 2024-03-23 – 2024-10-30 (×4): qty 2, 28d supply, fill #0

## 2024-03-23 MED ORDER — ACCRUFER 30 MG PO CAPS
30.0000 mg | ORAL_CAPSULE | Freq: Two times a day (BID) | ORAL | 1 refills | Status: AC
  Filled 2024-03-23: qty 60, 30d supply, fill #0

## 2024-03-23 MED ORDER — NALOXONE HCL 4 MG/0.1ML NA LIQD
NASAL | 0 refills | Status: AC
  Filled 2024-03-23: qty 2, 2d supply, fill #0

## 2024-03-23 MED ORDER — OXYCODONE HCL 10 MG PO TABS
10.0000 mg | ORAL_TABLET | Freq: Four times a day (QID) | ORAL | 0 refills | Status: DC | PRN
  Filled 2024-03-23: qty 120, 30d supply, fill #0

## 2024-03-23 MED ORDER — FOLIC ACID 1 MG PO TABS
1.0000 mg | ORAL_TABLET | Freq: Every day | ORAL | 1 refills | Status: AC
  Filled 2024-03-23 – 2024-10-30 (×2): qty 90, 90d supply, fill #0

## 2024-03-23 MED ORDER — CELECOXIB 200 MG PO CAPS
200.0000 mg | ORAL_CAPSULE | Freq: Two times a day (BID) | ORAL | 0 refills | Status: AC | PRN
  Filled 2024-03-23: qty 60, 30d supply, fill #0

## 2024-03-25 ENCOUNTER — Other Ambulatory Visit (HOSPITAL_COMMUNITY): Payer: Self-pay

## 2024-03-26 DIAGNOSIS — Z79899 Other long term (current) drug therapy: Secondary | ICD-10-CM | POA: Diagnosis not present

## 2024-04-03 ENCOUNTER — Other Ambulatory Visit (HOSPITAL_COMMUNITY): Payer: Self-pay

## 2024-04-22 ENCOUNTER — Other Ambulatory Visit: Payer: Self-pay

## 2024-04-22 ENCOUNTER — Other Ambulatory Visit (HOSPITAL_COMMUNITY): Payer: Self-pay

## 2024-04-22 DIAGNOSIS — R0602 Shortness of breath: Secondary | ICD-10-CM | POA: Diagnosis not present

## 2024-04-22 DIAGNOSIS — M4300 Spondylolysis, site unspecified: Secondary | ICD-10-CM | POA: Diagnosis not present

## 2024-04-22 DIAGNOSIS — Z79899 Other long term (current) drug therapy: Secondary | ICD-10-CM | POA: Diagnosis not present

## 2024-04-22 DIAGNOSIS — D500 Iron deficiency anemia secondary to blood loss (chronic): Secondary | ICD-10-CM | POA: Diagnosis not present

## 2024-04-22 DIAGNOSIS — R809 Proteinuria, unspecified: Secondary | ICD-10-CM | POA: Diagnosis not present

## 2024-04-22 DIAGNOSIS — D259 Leiomyoma of uterus, unspecified: Secondary | ICD-10-CM | POA: Diagnosis not present

## 2024-04-22 DIAGNOSIS — R002 Palpitations: Secondary | ICD-10-CM | POA: Diagnosis not present

## 2024-04-22 DIAGNOSIS — E119 Type 2 diabetes mellitus without complications: Secondary | ICD-10-CM | POA: Diagnosis not present

## 2024-04-22 DIAGNOSIS — E1129 Type 2 diabetes mellitus with other diabetic kidney complication: Secondary | ICD-10-CM | POA: Diagnosis not present

## 2024-04-22 DIAGNOSIS — F17210 Nicotine dependence, cigarettes, uncomplicated: Secondary | ICD-10-CM | POA: Diagnosis not present

## 2024-04-22 DIAGNOSIS — E538 Deficiency of other specified B group vitamins: Secondary | ICD-10-CM | POA: Diagnosis not present

## 2024-04-22 MED ORDER — CELECOXIB 400 MG PO CAPS
400.0000 mg | ORAL_CAPSULE | Freq: Two times a day (BID) | ORAL | 0 refills | Status: AC | PRN
  Filled 2024-04-22: qty 60, 30d supply, fill #0

## 2024-04-22 MED ORDER — FOLIC ACID 1 MG PO TABS
1.0000 mg | ORAL_TABLET | Freq: Every day | ORAL | 1 refills | Status: AC
  Filled 2024-04-22 – 2024-10-30 (×2): qty 30, 30d supply, fill #0

## 2024-04-22 MED ORDER — NALOXONE HCL 4 MG/0.1ML NA LIQD
NASAL | 0 refills | Status: AC
  Filled 2024-04-22: qty 2, 2d supply, fill #0

## 2024-04-22 MED ORDER — ACCRUFER 30 MG PO CAPS
30.0000 mg | ORAL_CAPSULE | Freq: Two times a day (BID) | ORAL | 1 refills | Status: AC
  Filled 2024-04-22: qty 60, 30d supply, fill #0

## 2024-04-22 MED ORDER — OXYCODONE HCL 10 MG PO TABS
10.0000 mg | ORAL_TABLET | Freq: Four times a day (QID) | ORAL | 0 refills | Status: DC | PRN
  Filled 2024-04-22: qty 120, 30d supply, fill #0

## 2024-04-23 ENCOUNTER — Other Ambulatory Visit: Payer: Self-pay

## 2024-04-27 DIAGNOSIS — Z79899 Other long term (current) drug therapy: Secondary | ICD-10-CM | POA: Diagnosis not present

## 2024-04-28 ENCOUNTER — Other Ambulatory Visit: Payer: Self-pay

## 2024-05-26 ENCOUNTER — Other Ambulatory Visit: Payer: Self-pay

## 2024-05-26 ENCOUNTER — Other Ambulatory Visit (HOSPITAL_COMMUNITY): Payer: Self-pay

## 2024-05-26 DIAGNOSIS — F17210 Nicotine dependence, cigarettes, uncomplicated: Secondary | ICD-10-CM | POA: Diagnosis not present

## 2024-05-26 DIAGNOSIS — Z79899 Other long term (current) drug therapy: Secondary | ICD-10-CM | POA: Diagnosis not present

## 2024-05-26 DIAGNOSIS — D500 Iron deficiency anemia secondary to blood loss (chronic): Secondary | ICD-10-CM | POA: Diagnosis not present

## 2024-05-26 DIAGNOSIS — E1129 Type 2 diabetes mellitus with other diabetic kidney complication: Secondary | ICD-10-CM | POA: Diagnosis not present

## 2024-05-26 DIAGNOSIS — R002 Palpitations: Secondary | ICD-10-CM | POA: Diagnosis not present

## 2024-05-26 DIAGNOSIS — M4300 Spondylolysis, site unspecified: Secondary | ICD-10-CM | POA: Diagnosis not present

## 2024-05-26 DIAGNOSIS — E119 Type 2 diabetes mellitus without complications: Secondary | ICD-10-CM | POA: Diagnosis not present

## 2024-05-26 DIAGNOSIS — R809 Proteinuria, unspecified: Secondary | ICD-10-CM | POA: Diagnosis not present

## 2024-05-26 DIAGNOSIS — E538 Deficiency of other specified B group vitamins: Secondary | ICD-10-CM | POA: Diagnosis not present

## 2024-05-26 DIAGNOSIS — D259 Leiomyoma of uterus, unspecified: Secondary | ICD-10-CM | POA: Diagnosis not present

## 2024-05-26 MED ORDER — NALOXONE HCL 4 MG/0.1ML NA LIQD: NASAL | 0 refills | Status: AC

## 2024-05-26 MED ORDER — FOLIC ACID 1 MG PO TABS
1.0000 mg | ORAL_TABLET | Freq: Every day | ORAL | 1 refills | Status: AC
  Filled 2024-05-26 – 2024-10-30 (×2): qty 90, 90d supply, fill #0

## 2024-05-26 MED ORDER — OXYCODONE HCL 10 MG PO TABS
10.0000 mg | ORAL_TABLET | Freq: Four times a day (QID) | ORAL | 0 refills | Status: DC | PRN
Start: 2024-05-26 — End: 2024-06-26
  Filled 2024-05-26 (×3): qty 120, 30d supply, fill #0

## 2024-05-26 MED ORDER — CELECOXIB 400 MG PO CAPS
400.0000 mg | ORAL_CAPSULE | Freq: Two times a day (BID) | ORAL | 0 refills | Status: AC
  Filled 2024-05-26 – 2024-06-12 (×2): qty 60, 30d supply, fill #0

## 2024-05-26 MED ORDER — ACCRUFER 30 MG PO CAPS
1.0000 | ORAL_CAPSULE | Freq: Two times a day (BID) | ORAL | 1 refills | Status: AC
  Filled 2024-05-26 – 2024-06-12 (×2): qty 60, 30d supply, fill #0
  Filled 2024-08-26: qty 180, 90d supply, fill #1

## 2024-05-27 ENCOUNTER — Other Ambulatory Visit (HOSPITAL_COMMUNITY): Payer: Self-pay

## 2024-05-29 ENCOUNTER — Other Ambulatory Visit (HOSPITAL_COMMUNITY): Payer: Self-pay

## 2024-06-01 DIAGNOSIS — Z79899 Other long term (current) drug therapy: Secondary | ICD-10-CM | POA: Diagnosis not present

## 2024-06-05 ENCOUNTER — Other Ambulatory Visit (HOSPITAL_COMMUNITY): Payer: Self-pay

## 2024-06-09 ENCOUNTER — Other Ambulatory Visit (HOSPITAL_COMMUNITY): Payer: Self-pay

## 2024-06-10 ENCOUNTER — Other Ambulatory Visit: Payer: Self-pay

## 2024-06-10 ENCOUNTER — Other Ambulatory Visit (HOSPITAL_COMMUNITY): Payer: Self-pay

## 2024-06-10 DIAGNOSIS — J301 Allergic rhinitis due to pollen: Secondary | ICD-10-CM | POA: Diagnosis not present

## 2024-06-10 DIAGNOSIS — R0602 Shortness of breath: Secondary | ICD-10-CM | POA: Diagnosis not present

## 2024-06-10 DIAGNOSIS — I517 Cardiomegaly: Secondary | ICD-10-CM | POA: Diagnosis not present

## 2024-06-10 DIAGNOSIS — F17210 Nicotine dependence, cigarettes, uncomplicated: Secondary | ICD-10-CM | POA: Diagnosis not present

## 2024-06-10 DIAGNOSIS — J4489 Other specified chronic obstructive pulmonary disease: Secondary | ICD-10-CM | POA: Diagnosis not present

## 2024-06-10 DIAGNOSIS — J383 Other diseases of vocal cords: Secondary | ICD-10-CM | POA: Diagnosis not present

## 2024-06-10 MED ORDER — BUDESONIDE-FORMOTEROL FUMARATE 160-4.5 MCG/ACT IN AERO
2.0000 | INHALATION_SPRAY | Freq: Two times a day (BID) | RESPIRATORY_TRACT | 6 refills | Status: AC
  Filled 2024-06-10: qty 10.2, 30d supply, fill #0
  Filled 2024-09-14: qty 10.2, 30d supply, fill #1
  Filled 2024-10-30: qty 10.2, 30d supply, fill #0

## 2024-06-10 MED ORDER — MONTELUKAST SODIUM 10 MG PO TABS
10.0000 mg | ORAL_TABLET | Freq: Every evening | ORAL | 6 refills | Status: AC
  Filled 2024-06-10: qty 30, 30d supply, fill #0
  Filled 2024-09-14: qty 30, 30d supply, fill #1
  Filled 2024-10-30: qty 30, 30d supply, fill #0

## 2024-06-10 MED ORDER — NICOTINE POLACRILEX 4 MG MT GUM
4.0000 mg | CHEWING_GUM | OROMUCOSAL | 0 refills | Status: AC
  Filled 2024-06-10: qty 700, 29d supply, fill #0

## 2024-06-10 MED ORDER — ALBUTEROL SULFATE HFA 108 (90 BASE) MCG/ACT IN AERS
2.0000 | INHALATION_SPRAY | Freq: Four times a day (QID) | RESPIRATORY_TRACT | 6 refills | Status: AC | PRN
  Filled 2024-06-10: qty 18, 25d supply, fill #0
  Filled 2024-08-03 – 2024-10-27 (×2): qty 18, 25d supply, fill #1
  Filled 2024-10-30: qty 18, 25d supply, fill #0

## 2024-06-10 MED ORDER — NICOTINE 21-14-7 MG/24HR TD KIT
PACK | Freq: Every day | TRANSDERMAL | 0 refills | Status: AC
  Filled 2024-06-10: qty 56, 56d supply, fill #0

## 2024-06-10 MED ORDER — IPRATROPIUM-ALBUTEROL 0.5-2.5 (3) MG/3ML IN SOLN
RESPIRATORY_TRACT | 3 refills | Status: AC
  Filled 2024-06-10 – 2024-10-30 (×2): qty 360, 30d supply, fill #0

## 2024-06-11 ENCOUNTER — Other Ambulatory Visit (HOSPITAL_COMMUNITY): Payer: Self-pay

## 2024-06-12 ENCOUNTER — Other Ambulatory Visit (HOSPITAL_COMMUNITY): Payer: Self-pay

## 2024-06-12 ENCOUNTER — Other Ambulatory Visit: Payer: Self-pay

## 2024-06-14 ENCOUNTER — Other Ambulatory Visit: Payer: Self-pay

## 2024-06-15 ENCOUNTER — Other Ambulatory Visit: Payer: Self-pay

## 2024-06-17 ENCOUNTER — Other Ambulatory Visit: Payer: Self-pay

## 2024-06-22 ENCOUNTER — Ambulatory Visit: Payer: Self-pay

## 2024-06-22 ENCOUNTER — Other Ambulatory Visit: Payer: Self-pay

## 2024-06-22 ENCOUNTER — Other Ambulatory Visit: Payer: Self-pay | Admitting: Internal Medicine

## 2024-06-22 DIAGNOSIS — N950 Postmenopausal bleeding: Secondary | ICD-10-CM

## 2024-06-22 DIAGNOSIS — N938 Other specified abnormal uterine and vaginal bleeding: Secondary | ICD-10-CM

## 2024-06-22 NOTE — Telephone Encounter (Signed)
 Requesting Megace  refill, confirmed preferred pharmacy.  FYI Only or Action Required?: Action required by provider: medication refill request and update on patient condition.  Patient was last seen in primary care on 12/10/2023 by Vicci Barnie NOVAK, MD.  Called Nurse Triage reporting Menstrual Problem.  Symptoms began several days ago.  Interventions attempted: Nothing.  Symptoms are: gradually worsening.  Triage Disposition: See HCP Within 4 Hours (Or PCP Triage) - refused, next day OV scheduled, ED precautions reviewed, pt verbalized understanding.   Patient/caregiver understands and will follow disposition?: No, refuses disposition Reason for Disposition  MODERATE vaginal bleeding (e.g., soaking 1 pad or tampon per hour and present > 6 hours; 1 menstrual cup every 6 hours)  Answer Assessment - Initial Assessment Questions Patient requesting Megace  for heavy menstrual bleeding. Patient denies higher acuity questions. Refused same day visit at alternate location, scheduled next day. Advised to go to ED if she begins to saturate 2 or more pads or tampons per hour or if she develops severe abdominal pain, dizziness or lightheadedness. Pt verbalized understanding.   1. BLEEDING SEVERITY: Describe the bleeding that you are having. How much bleeding is there?      States severe, soaking through pads and tampons  2. ONSET: When did the bleeding begin? Is it continuing now?     4 days  3. ABDOMEN PAIN: Do you have any pain? How bad is the pain?  (e.g., Scale 0-10; none, mild, moderate, or severe)     Cramping  Protocols used: Vaginal Bleeding - Abnormal-A-AH Copied from CRM #[id number]. Topic: Clinical - Red Word Triage >> Jun 22, 2024 12:11 PM Viola F wrote: Red Word that prompted transfer to Nurse Triage: Patient bleeding extremely heavy and having blood clots says she needs the megestrol  (MEGACE ) 40 MG tablet medication asap

## 2024-06-22 NOTE — Telephone Encounter (Signed)
 Pt has appointment tomorrow.

## 2024-06-23 ENCOUNTER — Other Ambulatory Visit: Payer: Self-pay

## 2024-06-23 ENCOUNTER — Encounter: Payer: Self-pay | Admitting: Nurse Practitioner

## 2024-06-23 ENCOUNTER — Ambulatory Visit: Payer: Self-pay | Attending: Nurse Practitioner | Admitting: Nurse Practitioner

## 2024-06-23 VITALS — BP 154/96 | HR 68 | Resp 18 | Ht 63.0 in | Wt 307.2 lb

## 2024-06-23 DIAGNOSIS — N938 Other specified abnormal uterine and vaginal bleeding: Secondary | ICD-10-CM | POA: Diagnosis not present

## 2024-06-23 MED ORDER — MEGESTROL ACETATE 40 MG PO TABS
40.0000 mg | ORAL_TABLET | Freq: Two times a day (BID) | ORAL | 1 refills | Status: DC
  Filled 2024-06-23: qty 60, 30d supply, fill #0
  Filled 2024-08-03: qty 60, 30d supply, fill #1

## 2024-06-23 NOTE — Patient Instructions (Addendum)
Placed in [facility]  [address]  [place], [place] [address] [phone]

## 2024-06-23 NOTE — Progress Notes (Signed)
 Assessment & Plan:  [name] was seen today for menorrhagia.  Diagnoses and all orders for this visit:  DUB (dysfunctional uterine bleeding) -     Ambulatory referral to Gynecology -     megestrol  (MEGACE ) 40 MG tablet; Take 1 tablet (40 mg total) by mouth 2 (two) times daily. Abnormal uterine bleeding Chronic abnormal uterine bleeding with recent exacerbation. Long-term megace  use not recommended.  - Refilled megace  prescription for short-term bleeding management. - Referred to gynecology for comprehensive evaluation, including potential biopsy or colposcopy. - Advised her to contact gynecology office for appointment, preferably with female gynecologist. - Discussed use of briefs for managing heavy bleeding.  Patient has been counseled on age-appropriate routine health concerns for screening and prevention. These are reviewed and up-to-date. Referrals have been placed accordingly. Immunizations are up-to-date or declined.    Subjective:   Chief Complaint  Patient presents with   Menorrhagia    History of Present Illness Hannah Baldwin is a 53 year old female who presents with recurrent heavy uterine bleeding.  She is a patient of Dr. Vicci  She has a history of heavy and abnormal uterine bleeding, previously managed with Megace . If she misses even one day of her medication, she experiences spotting or bleeding. She has been off the medication for several months, thinking the issue was resolved, but recently the bleeding has recurred. The bleeding has been constant since last Friday, requiring frequent changes of pads and even using tissue for additional absorption due to the volume of bleeding. The bleeding can be so heavy that it forms clots. She recalls a previous referral for a pelvic ultrasound to investigate the source of the bleeding. She has not had a recent biopsy or colposcopy, although she recalls having one several years ago.    Review of Systems  Constitutional:   Negative for fever, malaise/fatigue and weight loss.  Cardiovascular: Negative.  Negative for chest pain, palpitations and leg swelling.  Gastrointestinal: Negative.  Negative for heartburn, nausea and vomiting.  Genitourinary:        SEE HPI  Musculoskeletal: Negative.  Negative for myalgias.  Neurological: Negative.  Negative for dizziness, focal weakness, seizures and headaches.  Psychiatric/Behavioral: Negative.  Negative for suicidal ideas.     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   Outpatient Medications Prior to Visit  Medication Sig Dispense Refill   acetaminophen  (TYLENOL ) 500 MG tablet Take 1,000 mg by mouth every 6 (six) hours as needed for moderate pain or headache.     albuterol  (PROVENTIL ) (2.5 MG/3ML) 0.083% nebulizer solution Take 3 mLs (2.5 mg total) by nebulization every 6 (six) hours as needed for wheezing or shortness of breath. 150 mL 1   albuterol  (VENTOLIN  HFA) 108 (90 Base) MCG/ACT inhaler Inhale 2 puffs into the lungs 4 (four) times daily as needed for wheezing or shortness of breath 18 g 6   blood glucose meter kit and supplies KIT Dispense based on patient and insurance preference.  Use up to four times daily as directed. (FOR ICD-9 250.00, 250.01). 1 each 0   Blood Glucose Monitoring Suppl (TRUE METRIX METER) w/Device KIT USE AS INSTRUCTED 1 kit 0   budesonide -formoterol  (SYMBICORT ) 160-4.5 MCG/ACT inhaler Inhale 2 puffs into the lungs 2 (two) times daily. 10.2 g 6   celecoxib  (CELEBREX ) 400 MG capsule Take 1 capsule (400 mg total) by mouth 2 (two) times daily as needed  for pain 60 capsule 0   cetirizine  (ZYRTEC ) 10 MG tablet Take 1 tablet (10 mg total) by mouth daily. 30 tablet 0   DULoxetine  (CYMBALTA ) 60 MG capsule Take 1 capsule (60 mg total) by mouth daily. 30 capsule 5   Ferric Maltol  (ACCRUFER ) 30 MG CAPS Take 1 capsule (30 mg total) by mouth 2 (two) times daily. 180 capsule 1   folic acid  (FOLVITE ) 1 MG tablet Take 1 tablet (1 mg total) by mouth daily. 90 tablet 1   furosemide  (LASIX ) 20 MG tablet Take 1 tablet (20 mg total) by mouth daily. 5 tablet 3   gabapentin  (NEURONTIN ) 300 MG capsule Take 1 capsule (300 mg total) by mouth at bedtime. 90 capsule 1   glucose blood (TRUE METRIX BLOOD GLUCOSE TEST) test strip Use as instructed 100 each 12   ipratropium-albuterol  (DUONEB) 0.5-2.5 (3) MG/3ML SOLN Inhale 1 vial by mouth into lungs by nebulizer every 6 to 8 hours as needed for wheezing 360 mL 3   losartan  (COZAAR ) 25 MG tablet Take 1 tablet (25 mg total) by mouth daily. Please schedule and keep an appointment with Dr. Vicci for more refills. 30 tablet 0   mometasone -formoterol  (DULERA ) 100-5 MCG/ACT AERO Inhale 2 puffs into the lungs 2 (two) times daily. 13 g 12   montelukast  (SINGULAIR ) 10 MG tablet Take 1 tablet (10 mg total) by mouth every evening. 30 tablet 6   nicotine  polacrilex (NICORETTE ) 4 MG gum Place 1 piece of gum between the gum and the cheek every 1 to 2 hours as needed for nicotine  cravings 700 each 0   oxyCODONE -acetaminophen  (PERCOCET) 10-325 MG tablet Take 1 tablet by mouth 4 (four) times daily. 120 tablet 0   albuterol  (VENTOLIN  HFA) 108 (90 Base) MCG/ACT inhaler Inhale 2 puffs into the lungs every 4 hours as needed for wheezing or shortness of breath . (Patient not taking: Reported on 06/23/2024) 18 g 1   celecoxib  (CELEBREX ) 200 MG capsule Take 1 capsule (200 mg total) by mouth 2 (two) times daily as needed for pain. (Patient not taking: Reported on 06/23/2024) 60 capsule 0   celecoxib  (CELEBREX ) 400 MG capsule Take 1 capsule (400 mg  total) by mouth 2 (two) times daily as needed for pain. (Patient not taking: Reported on 06/23/2024) 60 capsule 0   Dulaglutide  (TRULICITY ) 1.5 MG/0.5ML SOAJ Inject 1.5 mg into the skin once a week. (Patient not taking: Reported on 06/23/2024) 2 mL 0   Ferric Maltol  (ACCRUFER ) 30 MG CAPS Take 1 capsule (30 mg total) by mouth 2 (two) times daily. (Patient not taking: Reported on 06/23/2024) 180 capsule 1   Ferric Maltol  (ACCRUFER ) 30 MG CAPS Take 1 capsule (30 mg total) by mouth 2 (two) times daily. (Patient not taking: Reported on 06/23/2024) 180 capsule 1   fluconazole  (DIFLUCAN ) 150 MG tablet Take 1 tablet (150 mg total) by mouth every 30 (thirty) days. (Patient not taking: Reported on 06/23/2024) 1 tablet 6   folic acid  (FOLVITE ) 1 MG tablet Take 1 tablet (1 mg total) by mouth daily. (Patient not taking:  Reported on 06/23/2024) 90 tablet 1   folic acid  (FOLVITE ) 1 MG tablet Take 1 tablet (1 mg total) by mouth daily. (Patient not taking: Reported on 06/23/2024) 90 tablet 1   meloxicam  (MOBIC ) 15 MG tablet Take 1 tablet (15 mg total) by mouth daily for 1 week then as needed for pain. (Patient not taking: Reported on 06/23/2024) 90 tablet 0   naloxone  (NARCAN ) nasal spray 4 mg/0.1 mL Place 1 spray into the nose as directed. 2 each 0   naloxone  (NARCAN ) nasal spray 4 mg/0.1 mL Spray 1 spray every 2 minutes as needed for opioid overdose; spray 1 dose into ONE nostril; alternate nostrils w each dose until help arrives 2 each 0   naloxone  (NARCAN ) nasal spray 4 mg/0.1 mL Place 1 spray into the nose every 2 minutes as needed for opioid overdose, alternate nostrils with each dose until help arrives 2 each 0   naloxone  (NARCAN ) nasal spray 4 mg/0.1 mL Spray 1 spray into nostrils every 2 minutes as needed for opioid overdose; Spray 1 dose into one nostril; alternate nostrils with each dose until help arrives. 2 each 0   naloxone  (NARCAN ) nasal spray 4 mg/0.1 mL Spray 1 spray every 2 minutes as needed for opioid  overdose; spray 1 dose into one nostril; alternate nostrils with each dose until help arrives 2 each 0   naloxone  (NARCAN ) nasal spray 4 mg/0.1 mL 1 spray every 2 minutes as needed for opioid overdose; spray 1 dose into ONE nostril; alternate nostrils w each dose until help arrives 1 each 0   Nicotine  21-14-7 MG/24HR KIT Place 1 patch onto the skin daily as directed every 24 hours. 56 each 0   Oxycodone  HCl 10 MG TABS Take 1 tablet (10 mg total) by mouth 3 (three) times daily as needed. (Patient not taking: Reported on 06/23/2024) 90 tablet 0   Oxycodone  HCl 10 MG TABS Take 1 (one) tablet by mouth three times daily, as needed (Patient not taking: Reported on 06/23/2024) 90 tablet 0   Oxycodone  HCl 10 MG TABS Take 1 tablet (10 mg total) by mouth 4 (four) times daily as needed. (Patient not taking: Reported on 06/23/2024) 120 tablet 0   oxyCODONE -acetaminophen  (PERCOCET) 10-325 MG tablet Take 1 tablet by mouth 4 times daily as needed (Patient not taking: Reported on 06/23/2024) 120 tablet 0   oxyCODONE -acetaminophen  (PERCOCET) 10-325 MG tablet Take 1 tablet by mouth 4 (four) times daily. (Patient not taking: Reported on 06/23/2024) 120 tablet 0   Semaglutide -Weight Management (WEGOVY ) 0.25 MG/0.5ML SOAJ Inject 0.25 mg into the skin once a week. (Patient not taking: Reported on 06/23/2024) 2 mL 0   traZODone  (DESYREL ) 100 MG tablet Take 1 tablet (100 mg total) by mouth at bedtime as needed for sleep. (Patient not taking: Reported on 06/23/2024) 90 tablet 0   TRUEPLUS LANCETS 28G MISC Check blood sugar TID & QHS 100 each 2   Vitamin D , Ergocalciferol , (DRISDOL ) 1.25 MG (50000 UNIT) CAPS capsule Take 1 capsule (50,000 Units total) by mouth once a week. (Patient not taking: Reported on 06/23/2024) 30 capsule 3   megestrol  (MEGACE ) 40 MG tablet Take 1 tablet (40 mg total) by mouth 2 (two) times daily. (Patient not taking: Reported on 06/23/2024) 60 tablet 0   Facility-Administered Medications Prior to Visit   Medication Dose Route Frequency Provider Last Rate Last Admin   albuterol  (PROVENTIL ) (2.5 MG/3ML) 0.083% nebulizer solution 2.5 mg  2.5 mg Nebulization Once  No Known Allergies     Objective:    BP (!) 154/96 (BP Location: Left Arm, Patient Position: Sitting, Cuff Size: Normal)   Pulse 68   Resp 18   Ht 5' 3 (1.6 m)   Wt (!) 307 lb 3.2 oz (139.3 kg)   LMP 06/19/2024 (Exact Date)   SpO2 98%   BMI 54.42 kg/m  Wt Readings from Last 3 Encounters:  06/23/24 (!) 307 lb 3.2 oz (139.3 kg)  12/10/23 (!) 318 lb (144.2 kg)  09/12/23 (!) 319 lb 9.6 oz (145 kg)    Physical Exam Vitals and nursing note reviewed.  Constitutional:      Appearance: She is well-developed.  HENT:     Head: Normocephalic and atraumatic.  Cardiovascular:     Rate and Rhythm: Normal rate and regular rhythm.     Heart sounds: Normal heart sounds. No murmur heard.    No friction rub. No gallop.  Pulmonary:     Effort: Pulmonary effort is normal. No tachypnea or respiratory distress.     Breath sounds: Normal breath sounds. No decreased breath sounds, wheezing, rhonchi or rales.  Chest:     Chest wall: No tenderness.  Musculoskeletal:        General: Normal range of motion.     Cervical back: Normal range of motion.  Skin:    General: Skin is warm and dry.  Neurological:     Mental Status: She is alert and oriented to person, place, and time.     Coordination: Coordination normal.  Psychiatric:        Behavior: Behavior normal. Behavior is cooperative.        Thought Content: Thought content normal.        Judgment: Judgment normal.          Patient has been counseled extensively about nutrition and exercise as well as the importance of adherence with medications and regular follow-up. The patient was given clear instructions to go to ER or return to medical center if symptoms don't improve, worsen or new problems develop. The patient verbalized understanding.   Follow-up: Return if  symptoms worsen or fail to improve.   Haze LELON Servant, FNP-BC [facility] [place], [place] [phone]   06/23/2024, 2:59 PM

## 2024-06-26 ENCOUNTER — Other Ambulatory Visit (HOSPITAL_COMMUNITY): Payer: Self-pay

## 2024-06-26 DIAGNOSIS — Z79899 Other long term (current) drug therapy: Secondary | ICD-10-CM | POA: Diagnosis not present

## 2024-06-26 DIAGNOSIS — M4300 Spondylolysis, site unspecified: Secondary | ICD-10-CM | POA: Diagnosis not present

## 2024-06-26 DIAGNOSIS — I10 Essential (primary) hypertension: Secondary | ICD-10-CM | POA: Diagnosis not present

## 2024-06-26 DIAGNOSIS — F17210 Nicotine dependence, cigarettes, uncomplicated: Secondary | ICD-10-CM | POA: Diagnosis not present

## 2024-06-26 MED ORDER — OXYCODONE HCL 10 MG PO TABS
10.0000 mg | ORAL_TABLET | Freq: Four times a day (QID) | ORAL | 0 refills | Status: DC | PRN
  Filled 2024-06-26: qty 120, 30d supply, fill #0

## 2024-06-26 MED ORDER — CELECOXIB 400 MG PO CAPS
400.0000 mg | ORAL_CAPSULE | Freq: Two times a day (BID) | ORAL | 0 refills | Status: AC | PRN
  Filled 2024-06-26 – 2024-10-30 (×2): qty 60, 30d supply, fill #0

## 2024-06-30 ENCOUNTER — Other Ambulatory Visit (HOSPITAL_COMMUNITY): Payer: Self-pay

## 2024-07-02 DIAGNOSIS — Z79899 Other long term (current) drug therapy: Secondary | ICD-10-CM | POA: Diagnosis not present

## 2024-07-09 ENCOUNTER — Other Ambulatory Visit (HOSPITAL_COMMUNITY): Payer: Self-pay

## 2024-07-28 ENCOUNTER — Other Ambulatory Visit (HOSPITAL_COMMUNITY): Payer: Self-pay

## 2024-07-28 MED ORDER — FOLIC ACID 1 MG PO TABS
1.0000 mg | ORAL_TABLET | Freq: Every day | ORAL | 1 refills | Status: AC
  Filled 2024-10-30: qty 90, 90d supply, fill #0

## 2024-07-28 MED ORDER — NALOXONE HCL 4 MG/0.1ML NA LIQD: 1.0000 | NASAL | 0 refills | Status: AC

## 2024-07-28 MED ORDER — OXYCODONE HCL 10 MG PO TABS
10.0000 mg | ORAL_TABLET | Freq: Four times a day (QID) | ORAL | 0 refills | Status: DC | PRN
  Filled 2024-07-28: qty 120, 30d supply, fill #0

## 2024-07-28 MED ORDER — CELECOXIB 400 MG PO CAPS
400.0000 mg | ORAL_CAPSULE | Freq: Two times a day (BID) | ORAL | 0 refills | Status: AC
  Filled 2024-07-28 – 2024-08-14 (×2): qty 60, 30d supply, fill #0

## 2024-07-28 MED ORDER — ACCRUFER 30 MG PO CAPS: 30.0000 mg | ORAL_CAPSULE | Freq: Two times a day (BID) | ORAL | 1 refills | Status: AC

## 2024-07-28 MED ORDER — OZEMPIC (0.25 OR 0.5 MG/DOSE) 2 MG/3ML ~~LOC~~ SOPN: 0.2500 | PEN_INJECTOR | SUBCUTANEOUS | 0 refills | Status: AC

## 2024-08-03 ENCOUNTER — Other Ambulatory Visit: Payer: Self-pay | Admitting: Internal Medicine

## 2024-08-03 ENCOUNTER — Other Ambulatory Visit (HOSPITAL_COMMUNITY): Payer: Self-pay

## 2024-08-03 ENCOUNTER — Other Ambulatory Visit: Payer: Self-pay

## 2024-08-03 DIAGNOSIS — G4700 Insomnia, unspecified: Secondary | ICD-10-CM

## 2024-08-04 ENCOUNTER — Other Ambulatory Visit: Payer: Self-pay

## 2024-08-04 MED ORDER — TRAZODONE HCL 100 MG PO TABS
100.0000 mg | ORAL_TABLET | Freq: Every evening | ORAL | 0 refills | Status: DC | PRN
  Filled 2024-08-04: qty 90, 90d supply, fill #0

## 2024-08-06 ENCOUNTER — Other Ambulatory Visit: Payer: Self-pay

## 2024-08-07 ENCOUNTER — Other Ambulatory Visit: Payer: Self-pay

## 2024-08-13 ENCOUNTER — Other Ambulatory Visit: Payer: Self-pay | Admitting: Internal Medicine

## 2024-08-13 ENCOUNTER — Ambulatory Visit: Payer: Self-pay | Admitting: *Deleted

## 2024-08-13 DIAGNOSIS — N938 Other specified abnormal uterine and vaginal bleeding: Secondary | ICD-10-CM

## 2024-08-13 NOTE — Telephone Encounter (Signed)
 Copied from CRM #[id number]. Topic: Clinical - Red Word Triage >> Aug 13, 2024  9:19 AM Rosaria BRAVO wrote: Red Word that prompted transfer to Nurse Triage: Cannot stop bleeding, says her medicine that is supposed to stop her from bleeding is not helping. She does not know the name of this Rx.  Call was disconnected at transfer- attempted to contact patient- no answer- left message to call office.

## 2024-08-13 NOTE — Telephone Encounter (Signed)
 FYI Only or Action Required?: FYI only for provider: UC advised d/t no availability in office.  Patient was last seen in primary care on 06/23/2024 by Hannah Baldwin ORN, NP.  Called Nurse Triage reporting Vaginal Bleeding.  Symptoms began several weeks ago.  Interventions attempted: Prescription medications: Megace .  Symptoms are: gradually improving.  Triage Disposition: See Physician Within 24 Hours  Patient/caregiver understands and will follow disposition?: Yes  Reason for Disposition  Taking Coumadin (warfarin) or other strong blood thinner, or known bleeding disorder (e.g., thrombocytopenia)  Answer Assessment - Initial Assessment Questions Pt reports continuous menstrual bleeding since 07/27/24. Has longstanding history of long heavy periods. Normally takes megace  bid daily to control bleeding but ran out for 1-2 weeks. Got megace  refilled and has been taking it for a week now. Reports it normally stops the bleeding immediately but this time not effective. Has increased dose independently from BID to TID, and noticed decreased bleeding. Was going through 4-5 pads per day, now down to 3 day per day. Also reports she started taking bayer back and body BID for past week for chronic back pain. Advised pt to stop taking and use tylenol  instead as it adds up to 1000 mg aspirin daily which will exacerbate bleeding. Also advised pt to follow BID dosing instructions for megace  instead of TID. Reports lethargy and some SOB, denies dizziness. No appt  with any provider in office at any clinics within pt region. Advised UC within next 24 hrs. Pt reports she can't go today, but will go tomorrow. Advised UC or ED sooner for worsening symptoms. Refill request for megace  also submitted per pt request, confirmed pts phone and pharmacy. Currently has enough on hand to last a few weeks.  1. BLEEDING SEVERITY: Describe the bleeding that you are having. How much bleeding is there?      Currently going  through 3 pads per day, completely soaked.  2. ONSET: When did the bleeding begin? Is it continuing now?     07/27/24  3. MENSTRUAL PERIOD: When was the last normal menstrual period? How is this different than your period?     Reports never having regular periods  4. REGULARITY: How regular are your periods?     Has always had long period with clots and lots of bleeding  5. ABDOMEN PAIN: Do you have any pain? How bad is the pain?  (e.g., Scale 0-10; none, mild, moderate, or severe)     Lower abdominal cramps, intermittent. Can get up to 10/10. Has history of painful periods  6. PREGNANCY: Is there any chance you are pregnant? When was your last menstrual period?     Denies  7. BREASTFEEDING: Are you breastfeeding?     Denies  8. HORMONE MEDICINES: Are you taking any hormone medicines, prescription or over-the-counter? (e.g., birth control pills, estrogen)     Megace   9. BLOOD THINNER MEDICINES: Do you take any blood thinners? (e.g., Coumadin / warfarin, Pradaxa / dabigatran, aspirin)     Taking Bayer back and body twice per day for back pain for past week, each tab contains 500 mg aspirin  10. CAUSE: What do you think is causing the bleeding? (e.g., recent gyn surgery, recent gyn procedure; known bleeding disorder, cervical cancer, polycystic ovarian disease, fibroids)         Missing megace   11. HEMODYNAMIC STATUS: Are you weak or feeling lightheaded? If Yes, ask: Can you stand and walk normally?        Tired, denies dizziness. Able  to walk around and care for self  12. OTHER SYMPTOMS: What other symptoms are you having with the bleeding? (e.g., passed tissue, vaginal discharge, fever, menstrual-type cramps)       Cramps, had clots at first but they have since stopped  Protocols used: Vaginal Bleeding - Abnormal-A-AH

## 2024-08-14 ENCOUNTER — Other Ambulatory Visit (HOSPITAL_COMMUNITY): Payer: Self-pay

## 2024-08-14 ENCOUNTER — Other Ambulatory Visit: Payer: Self-pay

## 2024-08-14 MED ORDER — MEGESTROL ACETATE 40 MG PO TABS
40.0000 mg | ORAL_TABLET | Freq: Two times a day (BID) | ORAL | 0 refills | Status: DC
  Filled 2024-08-14: qty 60, 30d supply, fill #0

## 2024-08-14 NOTE — Telephone Encounter (Signed)
 Requested medication (s) are due for refill today - provider review   Requested medication (s) are on the active medication list -yes  Future visit scheduled -yes  Last refill: 06/23/24 #60 1RF  Notes to clinic: non delegated Rx  Requested Prescriptions  Pending Prescriptions Disp Refills   megestrol  (MEGACE ) 40 MG tablet 60 tablet 1    Sig: Take 1 tablet (40 mg total) by mouth 2 (two) times daily.     Not Delegated - OB/GYN:  Progestins - megestrol  acetate Failed - 08/14/2024  1:57 PM      Failed - This refill cannot be delegated      Failed - Last BP in normal range    BP Readings from Last 1 Encounters:  06/23/24 (!) 154/96         Failed - Valid encounter within last 12 months    Recent Outpatient Visits           1 month ago DUB (dysfunctional uterine bleeding)   [facility] [place]. [facility] Theotis Haze ORN, NP   8 months ago Moderate persistent asthma with exacerbation   [facility] [place] - [facility] Dept Of [place]. [facility] Vicci Sober B, MD   1 year ago Type 2 diabetes mellitus with morbid obesity [facility])   [facility] - [facility] Dept Of [place]. [facility] Vicci Sober NOVAK, MD   1 year ago Primary osteoarthritis of both knees   [facility] [place] - [facility] Dept Of [place]. [facility] Vicci Sober B, MD   1 year ago Type 2 diabetes mellitus with morbid obesity [facility])   [facility] - [facility] Dept Of [place]. [facility] [place], Jon M, PA-C                 Requested Prescriptions  Pending Prescriptions Disp Refills   megestrol  (MEGACE ) 40 MG tablet 60 tablet 1    Sig: Take 1 tablet (40 mg total) by mouth 2 (two) times daily.     Not Delegated - OB/GYN:  Progestins - megestrol  acetate Failed - 08/14/2024  1:57 PM      Failed - This refill cannot be delegated      Failed - Last BP in normal range    BP Readings  from Last 1 Encounters:  06/23/24 (!) 154/96         Failed - Valid encounter within last 12 months    Recent Outpatient Visits           1 month ago DUB (dysfunctional uterine bleeding)   [facility] [place] [facility] Of [place]. [facility] Theotis Haze ORN, NP   8 months ago Moderate persistent asthma with exacerbation   [facility] - A Dept Of [place]. [facility] Vicci Sober B, MD   1 year ago Type 2 diabetes mellitus with morbid obesity [facility])   [facility] Dept Of [place]. [facility] Vicci Sober NOVAK, MD   1 year ago Primary osteoarthritis of both knees   [facility] [place] - A Dept Of [place]. [facility] Vicci Sober B, MD   1 year ago Type 2 diabetes mellitus with morbid obesity [facility])   [facility] - A Dept Of .  [facility] [place], [place], PA-C

## 2024-08-17 ENCOUNTER — Telehealth: Payer: Self-pay | Admitting: Internal Medicine

## 2024-08-17 NOTE — Telephone Encounter (Signed)
 Confirmed appt for 11/11

## 2024-08-18 ENCOUNTER — Encounter: Payer: Self-pay | Admitting: Internal Medicine

## 2024-08-18 ENCOUNTER — Other Ambulatory Visit: Payer: Self-pay

## 2024-08-18 ENCOUNTER — Ambulatory Visit: Attending: Internal Medicine | Admitting: Internal Medicine

## 2024-08-18 VITALS — BP 138/81 | HR 89 | Resp 19 | Ht 63.0 in | Wt 320.0 lb

## 2024-08-18 DIAGNOSIS — E1140 Type 2 diabetes mellitus with diabetic neuropathy, unspecified: Secondary | ICD-10-CM | POA: Diagnosis present

## 2024-08-18 DIAGNOSIS — D509 Iron deficiency anemia, unspecified: Secondary | ICD-10-CM | POA: Diagnosis not present

## 2024-08-18 DIAGNOSIS — I10 Essential (primary) hypertension: Secondary | ICD-10-CM | POA: Diagnosis not present

## 2024-08-18 DIAGNOSIS — E1159 Type 2 diabetes mellitus with other circulatory complications: Secondary | ICD-10-CM | POA: Insufficient documentation

## 2024-08-18 DIAGNOSIS — Z1231 Encounter for screening mammogram for malignant neoplasm of breast: Secondary | ICD-10-CM

## 2024-08-18 DIAGNOSIS — E669 Obesity, unspecified: Secondary | ICD-10-CM | POA: Diagnosis present

## 2024-08-18 DIAGNOSIS — I152 Hypertension secondary to endocrine disorders: Secondary | ICD-10-CM | POA: Diagnosis not present

## 2024-08-18 DIAGNOSIS — D259 Leiomyoma of uterus, unspecified: Secondary | ICD-10-CM | POA: Insufficient documentation

## 2024-08-18 DIAGNOSIS — Z5971 Insufficient health insurance coverage: Secondary | ICD-10-CM | POA: Insufficient documentation

## 2024-08-18 DIAGNOSIS — Z2821 Immunization not carried out because of patient refusal: Secondary | ICD-10-CM | POA: Diagnosis not present

## 2024-08-18 DIAGNOSIS — J4540 Moderate persistent asthma, uncomplicated: Secondary | ICD-10-CM | POA: Insufficient documentation

## 2024-08-18 DIAGNOSIS — M1712 Unilateral primary osteoarthritis, left knee: Secondary | ICD-10-CM | POA: Insufficient documentation

## 2024-08-18 DIAGNOSIS — N938 Other specified abnormal uterine and vaginal bleeding: Secondary | ICD-10-CM | POA: Insufficient documentation

## 2024-08-18 DIAGNOSIS — F1191 Opioid use, unspecified, in remission: Secondary | ICD-10-CM | POA: Insufficient documentation

## 2024-08-18 MED ORDER — MEGESTROL ACETATE 40 MG PO TABS
40.0000 mg | ORAL_TABLET | Freq: Two times a day (BID) | ORAL | 3 refills | Status: AC
Start: 2024-08-18 — End: ?
  Filled 2024-08-18 – 2024-08-26 (×2): qty 60, 30d supply, fill #0

## 2024-08-18 MED ORDER — LOSARTAN POTASSIUM 25 MG PO TABS
25.0000 mg | ORAL_TABLET | Freq: Every day | ORAL | 0 refills | Status: AC
  Filled 2024-08-18: qty 90, 90d supply, fill #0

## 2024-08-18 NOTE — Patient Instructions (Addendum)
 Placed in [facility]  [address]  [place], [place] [address] 336 [phone]   VISIT SUMMARY: You came in today because of heavy vaginal bleeding that has been ongoing for the past three weeks. This started after you were out of your medication, Megestrol , for about a week. You have since resumed the medication, but the bleeding has not subsided as quickly as before. You also reported feeling very tired and short of breath, and you have been out of your iron supplements. Your blood pressure was elevated during the visit.  YOUR PLAN: -ABNORMAL UTERINE AND VAGINAL BLEEDING: This means you are experiencing unusual bleeding from your uterus and vagina, which could be due to fibroids or changes related to menopause. We have ordered a pelvic ultrasound to investigate the cause of the bleeding, refilled your Megestrol  prescription, and provided you with information for a gynecology referral. We also checked your hormone levels.  -ANEMIA: Anemia is a condition where you don't have enough healthy red blood cells to carry adequate oxygen  to your body's tissues, which can cause fatigue and shortness of breath. We have ordered a complete blood count to check your blood levels and refilled your iron supplementation prescription.  -HYPERTENSION: Hypertension is high blood pressure, which can lead to serious health problems if not managed properly. Your blood pressure was elevated today, and you mentioned missing a dose of your medication. Please make sure to take your antihypertensive medication as prescribed.  -GENERAL HEALTH MAINTENANCE: You are overdue for a mammogram, which is an important screening test for breast cancer. We have scheduled this for you. You also declined the flu shot today.  INSTRUCTIONS: Please follow up with the pelvic ultrasound as soon as you get the appointment date. Continue taking Megestrol  as prescribed and monitor your bleeding. Take your iron supplements and  antihypertensive medication as directed. Follow up with the gynecologist as soon as possible. Attend your scheduled mammogram. If you experience any worsening symptoms or have any concerns, please contact our office.                      Contains text generated by Abridge.                                 Contains text generated by Abridge.

## 2024-08-18 NOTE — Progress Notes (Signed)
 Patient ID: VALETTA MULROY, female    DOB: 25-Jul-1971  MRN: [id number]  CC: dysfunctional uterine bleeding   Subjective: Caliya Narine is a 53 y.o. 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   Discussed the use of AI scribe software for clinical note transcription with the patient, who gave verbal consent to proceed.  History of Present Illness RONEKA GILPIN is a 54 year old female with fibroids who presents with acute vaginal bleeding.  She has been experiencing heavy vaginal bleeding for the past three weeks, which began after she was out of her medication, Megestrol , for about a week due to insurance issues. She resumed taking Megestrol  at 40 mg twice daily, occasionally increasing to three times a day to manage the bleeding. Despite this, the bleeding did not subside as quickly as it had in the past, taking several days to reduce in intensity.  The bleeding was initially very heavy, requiring frequent changes of super pads and additional tissue for absorption. It has since decreased but remains unpredictable, with episodes of spotting primarily occurring at night. She has a history of similar bleeding episodes, previously managed with Megestrol . She had stopped taking the medication for several months earlier this year when the bleeding ceased, but it resumed, prompting her to restart the medication in Sept. she was referred to the gynecologist when [facility]  According to the referral, they were booked out until November so she could not get in.  She tells me that her pain management specialist has ordered a pelvic ultrasound but she is not sure the date and time.  She thinks they also may have referred her to gynecology in [facility] but  not sure of any appt.  She reports significant fatigue and shortness of breath. She has been out of her iron supplements due to insurance issues but plans to pick up refills today. No dizziness is reported.  Blood pressure noted to be elevated today.  She has not taken her Cozaar  as yet for today.    Patient Active Problem List   Diagnosis Date Noted   Asthma exacerbation 09/19/2021   Hypokalemia 09/19/2021   Essential hypertension 02/26/2020   Muscle cramp 02/26/2020   Fibroid uterus 01/09/2018   Primary osteoarthritis of left knee 04/09/2017   Insomnia 11/03/2015   Onychomycosis of toenail 07/25/2015   Diabetic neuropathy (HCC) 07/25/2015   DUB (dysfunctional uterine bleeding) 02/03/2015   Diabetes type 2, controlled (HCC) 01/25/2015   Severe obesity (BMI >= 40) (HCC) 01/25/2015   Moderate persistent asthma 01/21/2015   Cardiomegaly 06/09/2014   Hypochromic microcytic anemia 06/08/2014   Former smoker 01/01/2013     Current Outpatient Medications on File Prior to Visit  Medication Sig Dispense Refill   acetaminophen  (TYLENOL ) 500 MG tablet Take 1,000 mg by mouth every 6 (six) hours as needed for moderate pain or headache.     blood glucose meter kit and supplies KIT Dispense based on patient and insurance preference. Use up to four times daily as directed. (FOR ICD-9 250.00, 250.01). 1 each 0   Blood Glucose Monitoring Suppl (TRUE METRIX METER) w/Device KIT USE AS INSTRUCTED 1 kit 0   oxyCODONE -acetaminophen  (PERCOCET) 10-325 MG tablet Take 1 tablet by mouth 4 (four) times daily. 120 tablet  0   Vitamin D , Ergocalciferol , (DRISDOL ) 1.25 MG (50000 UNIT) CAPS capsule Take 1 capsule (50,000 Units total) by mouth once a week. 30 capsule 3   albuterol  (PROVENTIL ) (2.5 MG/3ML) 0.083% nebulizer solution Take 3 mLs (2.5 mg total) by nebulization every 6 (six) hours as needed for wheezing or shortness of breath. (Patient not taking: Reported on 08/18/2024) 150 mL 1   albuterol  (VENTOLIN   HFA) 108 (90 Base) MCG/ACT inhaler Inhale 2 puffs into the lungs every 4 hours as needed for wheezing or shortness of breath . (Patient not taking: Reported on 08/18/2024) 18 g 1   albuterol  (VENTOLIN  HFA) 108 (90 Base) MCG/ACT inhaler Inhale 2 puffs into the lungs 4 (four) times daily as needed for wheezing or shortness of breath (Patient not taking: Reported on 08/18/2024) 18 g 6   budesonide -formoterol  (SYMBICORT ) 160-4.5 MCG/ACT inhaler Inhale 2 puffs into the lungs 2 (two) times daily. (Patient not taking: Reported on 08/18/2024) 10.2 g 6   celecoxib  (CELEBREX ) 200 MG capsule Take 1 capsule (200 mg total) by mouth 2 (two) times daily as needed for pain. (Patient not taking: Reported on 06/23/2024) 60 capsule 0   celecoxib  (CELEBREX ) 400 MG capsule Take 1 capsule (400 mg total) by mouth 2 (two) times daily as needed for pain. (Patient not taking: Reported on 06/23/2024) 60 capsule 0   celecoxib  (CELEBREX ) 400 MG capsule Take 1 capsule (400 mg total) by mouth 2 (two) times daily as needed for pain (Patient not taking: Reported on 08/18/2024) 60 capsule 0   celecoxib  (CELEBREX ) 400 MG capsule Take 1 capsule (400 mg total) by mouth 2 (two) times daily as needed for pain. 60 capsule 0   celecoxib  (CELEBREX ) 400 MG capsule Take 1 capsule (400 mg total) by mouth 2 (two) times daily for pain 60 capsule 0   cetirizine  (ZYRTEC ) 10 MG tablet Take 1 tablet (10 mg total) by mouth daily. 30 tablet 0   Dulaglutide  (TRULICITY ) 1.5 MG/0.5ML SOAJ Inject 1.5 mg into the skin once a week. (Patient not taking: Reported on 08/18/2024) 2 mL 0   DULoxetine  (CYMBALTA ) 60 MG capsule Take 1 capsule (60 mg total) by mouth daily. (Patient not taking: Reported on 08/18/2024) 30 capsule 5   Ferric Maltol  (ACCRUFER ) 30 MG CAPS Take 1 capsule (30 mg total) by mouth 2 (two) times daily. (Patient not taking: Reported on 06/23/2024) 180 capsule 1   Ferric Maltol  (ACCRUFER ) 30 MG CAPS Take 1 capsule (30 mg total) by mouth 2 (two) times  daily. (Patient not taking: Reported on 06/23/2024) 180 capsule 1   Ferric Maltol  (ACCRUFER ) 30 MG CAPS Take 1 capsule (30 mg total) by mouth 2 (two) times daily. 180 capsule 1   Ferric Maltol  (ACCRUFER ) 30 MG CAPS Take 1 capsule (30 mg total) by mouth 2 (two) times daily. 180 capsule 1   folic acid  (FOLVITE ) 1 MG tablet Take 1 tablet (1 mg total) by mouth daily. (Patient not taking: Reported on 08/18/2024) 90 tablet 1   folic acid  (FOLVITE ) 1 MG tablet Take 1 tablet (1 mg total) by mouth daily. (Patient not taking: Reported on 08/18/2024) 90 tablet 1   folic acid  (FOLVITE ) 1 MG tablet Take 1 tablet (1 mg total) by mouth daily. (Patient not taking: Reported on 08/18/2024) 90 tablet 1   folic acid  (FOLVITE ) 1 MG tablet Take 1 tablet (1 mg total) by mouth daily. (Patient not taking: Reported on 08/18/2024) 90 tablet 1   furosemide  (LASIX ) 20 MG tablet Take 1 tablet (  20 mg total) by mouth daily. (Patient not taking: Reported on 08/18/2024) 5 tablet 3   gabapentin  (NEURONTIN ) 300 MG capsule Take 1 capsule (300 mg total) by mouth at bedtime. (Patient not taking: Reported on 08/18/2024) 90 capsule 1   glucose blood (TRUE METRIX BLOOD GLUCOSE TEST) test strip Use as instructed 100 each 12   ipratropium-albuterol  (DUONEB) 0.5-2.5 (3) MG/3ML SOLN Inhale 1 vial by mouth into lungs by nebulizer every 6 to 8 hours as needed for wheezing 360 mL 3   meloxicam  (MOBIC ) 15 MG tablet Take 1 tablet (15 mg total) by mouth daily for 1 week then as needed for pain. (Patient not taking: Reported on 08/18/2024) 90 tablet 0   mometasone -formoterol  (DULERA ) 100-5 MCG/ACT AERO Inhale 2 puffs into the lungs 2 (two) times daily. (Patient not taking: Reported on 08/18/2024) 13 g 12   montelukast  (SINGULAIR ) 10 MG tablet Take 1 tablet (10 mg total) by mouth every evening. 30 tablet 6   naloxone  (NARCAN ) nasal spray 4 mg/0.1 mL Place 1 spray into the nose as directed. 2 each 0   naloxone  (NARCAN ) nasal spray 4 mg/0.1 mL Spray 1 spray  every 2 minutes as needed for opioid overdose; spray 1 dose into ONE nostril; alternate nostrils w each dose until help arrives 2 each 0   naloxone  (NARCAN ) nasal spray 4 mg/0.1 mL Place 1 spray into the nose every 2 minutes as needed for opioid overdose, alternate nostrils with each dose until help arrives 2 each 0   naloxone  (NARCAN ) nasal spray 4 mg/0.1 mL Spray 1 spray into nostrils every 2 minutes as needed for opioid overdose; Spray 1 dose into one nostril; alternate nostrils with each dose until help arrives. 2 each 0   naloxone  (NARCAN ) nasal spray 4 mg/0.1 mL Spray 1 spray every 2 minutes as needed for opioid overdose; spray 1 dose into one nostril; alternate nostrils with each dose until help arrives 2 each 0   naloxone  (NARCAN ) nasal spray 4 mg/0.1 mL 1 spray every 2 minutes as needed for opioid overdose; spray 1 dose into ONE nostril; alternate nostrils w each dose until help arrives 1 each 0   naloxone  (NARCAN ) nasal spray 4 mg/0.1 mL 1 spray every 2 minutes as needed for opioid overdose; spray 1 dose into ONE nostril; alternate nostrils w each dose until help arrives 1 each 0   Nicotine  21-14-7 MG/24HR KIT Place 1 patch onto the skin daily as directed every 24 hours. (Patient not taking: Reported on 08/18/2024) 56 each 0   nicotine  polacrilex (NICORETTE ) 4 MG gum Place 1 piece of gum between the gum and the cheek every 1 to 2 hours as needed for nicotine  cravings 700 each 0   Oxycodone  HCl 10 MG TABS Take 1 tablet (10 mg total) by mouth 3 (three) times daily as needed. (Patient not taking: Reported on 08/18/2024) 90 tablet 0   Oxycodone  HCl 10 MG TABS Take 1 (one) tablet by mouth three times daily, as needed (Patient not taking: Reported on 08/18/2024) 90 tablet 0   Oxycodone  HCl 10 MG TABS Take 1 tablet (10 mg total) by mouth 4 (four) times daily as needed. (Patient not taking: Reported on 08/18/2024) 120 tablet 0   oxyCODONE -acetaminophen  (PERCOCET) 10-325 MG tablet Take 1 tablet by mouth  4 times daily as needed (Patient not taking: Reported on 08/18/2024) 120 tablet 0   oxyCODONE -acetaminophen  (PERCOCET) 10-325 MG tablet Take 1 tablet by mouth 4 (four) times daily. (Patient not taking: Reported on  08/18/2024) 120 tablet 0   Semaglutide ,0.25 or 0.5MG /DOS, (OZEMPIC , 0.25 OR 0.5 MG/DOSE,) 2 MG/3ML SOPN Inject 0.25 m into the skin once a week for 4 weeks then increase to 0.5mg  once weekly (Patient not taking: Reported on 08/18/2024) 2 mL 0   Semaglutide -Weight Management (WEGOVY ) 0.25 MG/0.5ML SOAJ Inject 0.25 mg into the skin once a week. (Patient not taking: Reported on 08/18/2024) 2 mL 0   traZODone  (DESYREL ) 100 MG tablet Take 1 tablet (100 mg total) by mouth at bedtime as needed for sleep. 90 tablet 0   TRUEPLUS LANCETS 28G MISC Check blood sugar TID & QHS 100 each 2   Current Facility-Administered Medications on File Prior to Visit  Medication Dose Route Frequency Provider Last Rate Last Admin   albuterol  (PROVENTIL ) (2.5 MG/3ML) 0.083% nebulizer solution 2.5 mg  2.5 mg Nebulization Once         No Known Allergies  Social History   Socioeconomic History   Marital status: Widowed    Spouse name: Not on file   Number of children: Not on file   Years of education: Not on file   Highest education level: Not on file  Occupational History   Not on file  Tobacco Use   Smoking status: Former    Current packs/day: 0.00    Average packs/day: 0.3 packs/day for 20.0 years (5.0 ttl pk-yrs)    Types: Cigarettes    Start date: 01/18/1995    Quit date: 01/18/2015    Years since quitting: 9.5   Smokeless tobacco: Never  Substance and Sexual Activity   Alcohol use: Yes    Comment: 01/20/2015 dank some alcohol on my birthday, cookouts,  etc,;  maybe once/month   Drug use: No   Sexual activity: Not Currently    Birth control/protection: None  Other Topics Concern   Not on file  Social History Narrative   Not on file   Social Drivers of Health   Financial Resource Strain:  Not on file  Food Insecurity: No Food Insecurity (12/10/2023)   Hunger Vital Sign    Worried About Running Out of Food in the Last Year: Never true    Ran Out of Food in the Last Year: Never true  Transportation Needs: No Transportation Needs (12/10/2023)   PRAPARE - Administrator, Civil Service (Medical): No    Lack of Transportation (Non-Medical): No  Physical Activity: Not on file  Stress: Not on file  Social Connections: Not on file  Intimate Partner Violence: Not At Risk (12/10/2023)   Humiliation, Afraid, Rape, and Kick questionnaire    Fear of Current or Ex-Partner: No    Emotionally Abused: No    Physically Abused: No    Sexually Abused: No    Family History  Problem Relation Age of Onset   Asthma Mother    Asthma Daughter     Past Surgical History:  Procedure Laterality Date   CESAREAN SECTION  1992    ROS: Review of Systems Negative except as stated above  PHYSICAL EXAM: BP 138/81 (BP Location: Other (Comment), Patient Position: Sitting, Cuff Size: Normal) Comment (BP Location): Lower left arm  Pulse 89   Resp 19   Ht 5' 3 (1.6 m)   Wt (!) 320 lb (145.2 kg)   SpO2 98%   BMI 56.69 kg/m   Wt Readings from Last 3 Encounters:  08/18/24 (!) 320 lb (145.2 kg)  06/23/24 (!) 307 lb 3.2 oz (139.3 kg)  12/10/23 (!) 318 lb (144.2  kg)    Physical Exam  General appearance - alert, well appearing, middle age morbidly obese AAF and in no distress Mental status - poor historian with pressured speech. I spent sometime trying to clarify the hx with her      Latest Ref Rng & Units 10/15/2022   11:43 AM 09/25/2021   12:41 AM 09/22/2021   10:28 AM  CMP  Glucose 70 - 99 mg/dL 855  890  874   BUN 6 - 24 mg/dL 13  16  22    Creatinine 0.57 - 1.00 mg/dL 8.99  9.19  8.95   Sodium 134 - 144 mmol/L 142  137  138   Potassium 3.5 - 5.2 mmol/L 4.1  3.8  3.8   Chloride 96 - 106 mmol/L 103  101  103   CO2 20 - 29 mmol/L 23  31  31    Calcium 8.7 - 10.2 mg/dL 9.1  8.5   8.8   Total Protein 6.0 - 8.5 g/dL 6.3     Total Bilirubin 0.0 - 1.2 mg/dL <9.7     Alkaline Phos 44 - 121 IU/L 85     AST 0 - 40 IU/L 12     ALT 0 - 32 IU/L 11      Lipid Panel     Component Value Date/Time   CHOL 159 10/15/2022 1143   TRIG 78 10/15/2022 1143   HDL 36 (L) 10/15/2022 1143   CHOLHDL 4.4 10/15/2022 1143   CHOLHDL 3.4 01/20/2015 2241   VLDL 15 01/20/2015 2241   LDLCALC 108 (H) 10/15/2022 1143    CBC    Component Value Date/Time   WBC 9.8 10/15/2022 1143   WBC 20.9 (H) 09/25/2021 0041   RBC 4.61 10/15/2022 1143   RBC 3.93 09/25/2021 0041   HGB 12.2 10/15/2022 1143   HCT 38.9 10/15/2022 1143   PLT 410 10/15/2022 1143   MCV 84 10/15/2022 1143   MCH 26.5 (L) 10/15/2022 1143   MCH 26.5 09/25/2021 0041   MCHC 31.4 (L) 10/15/2022 1143   MCHC 30.1 09/25/2021 0041   RDW 14.1 10/15/2022 1143   LYMPHSABS 4.4 (H) 10/15/2022 1143   MONOABS 2.1 (H) 09/25/2021 0041   EOSABS 0.1 10/15/2022 1143   BASOSABS 0.0 10/15/2022 1143    ASSESSMENT AND PLAN: 1. DUB (dysfunctional uterine bleeding) With known history of fibroids.  She should also be in the perimenopausal stage.  Will check CBC and hormone levels.  She prefers to have us  order the pelvic ultrasound.  Refill given on Megace .  Pt to pick up refill on Iron supplement and resume taking. Provided her the information and phone number for the [facility] so that she can call them to follow-up to see if appointment has become available.  Message also sent to our referral coordinator. - CBC - FSH/LH - US  Pelvic Complete With Transvaginal; Future - megestrol  (MEGACE ) 40 MG tablet; Take 1 tablet (40 mg total) by mouth 2 (two) times daily.  Dispense: 60 tablet; Refill: 3  2. Hypertension associated with type 2 diabetes mellitus (HCC) Not at goal but repeat blood pressure is better.  She will take her Cozaar  when she returns home. - losartan  (COZAAR ) 25 MG tablet; Take 1 tablet (25 mg total) by mouth daily.  Dispense: 90  tablet; Refill: 0  3. Encounter for screening mammogram for malignant neoplasm of breast (Primary) - MM 3D SCREENING MAMMOGRAM BILATERAL BREAST; Future  4. Influenza vaccination declined    Patient was given the  opportunity to ask questions.  Patient verbalized understanding of the plan and was able to repeat key elements of the plan.   This documentation was completed using Paediatric nurse.  Any transcriptional errors are unintentional.  Orders Placed This Encounter  Procedures   US  Pelvic Complete With Transvaginal   MM 3D SCREENING MAMMOGRAM BILATERAL BREAST   CBC   FSH/LH     Requested Prescriptions   Signed Prescriptions Disp Refills   megestrol  (MEGACE ) 40 MG tablet 60 tablet 3    Sig: Take 1 tablet (40 mg total) by mouth 2 (two) times daily.   losartan  (COZAAR ) 25 MG tablet 90 tablet 0    Sig: Take 1 tablet (25 mg total) by mouth daily.    Return in about 6 weeks (around 09/29/2024) for chronic ds management.  Barnie Louder, MD, FACP

## 2024-08-19 ENCOUNTER — Ambulatory Visit: Payer: Self-pay | Admitting: Internal Medicine

## 2024-08-19 LAB — CBC
Hematocrit: 35.5 % (ref 34.0–46.6)
Hemoglobin: 10.6 g/dL — ABNORMAL LOW (ref 11.1–15.9)
MCH: 25.9 pg — ABNORMAL LOW (ref 26.6–33.0)
MCHC: 29.9 g/dL — ABNORMAL LOW (ref 31.5–35.7)
MCV: 87 fL (ref 79–97)
Platelets: 435 x10E3/uL (ref 150–450)
RBC: 4.09 x10E6/uL (ref 3.77–5.28)
RDW: 14.5 % (ref 11.7–15.4)
WBC: 8.5 x10E3/uL (ref 3.4–10.8)

## 2024-08-19 LAB — FSH/LH
FSH: 2.9 m[IU]/mL
LH: 0.5 m[IU]/mL

## 2024-08-24 ENCOUNTER — Ambulatory Visit
Admission: RE | Admit: 2024-08-24 | Discharge: 2024-08-24 | Disposition: A | Source: Ambulatory Visit | Attending: Internal Medicine | Admitting: Internal Medicine

## 2024-08-24 DIAGNOSIS — N938 Other specified abnormal uterine and vaginal bleeding: Secondary | ICD-10-CM

## 2024-08-26 ENCOUNTER — Other Ambulatory Visit: Payer: Self-pay

## 2024-08-26 ENCOUNTER — Other Ambulatory Visit: Payer: Self-pay | Admitting: Internal Medicine

## 2024-08-27 ENCOUNTER — Other Ambulatory Visit: Payer: Self-pay

## 2024-08-28 ENCOUNTER — Other Ambulatory Visit (HOSPITAL_COMMUNITY): Payer: Self-pay

## 2024-08-28 ENCOUNTER — Other Ambulatory Visit: Payer: Self-pay

## 2024-08-28 MED ORDER — NALOXONE HCL 4 MG/0.1ML NA LIQD: NASAL | 0 refills | Status: AC

## 2024-08-28 MED ORDER — CYCLOBENZAPRINE HCL 5 MG PO TABS
5.0000 mg | ORAL_TABLET | Freq: Three times a day (TID) | ORAL | 0 refills | Status: DC
  Filled 2024-08-28: qty 90, 30d supply, fill #0

## 2024-08-28 MED ORDER — FOLIC ACID 1 MG PO TABS
1.0000 mg | ORAL_TABLET | Freq: Every day | ORAL | 1 refills | Status: AC
  Filled 2024-10-30: qty 90, 90d supply, fill #0

## 2024-08-28 MED ORDER — ACCRUFER 30 MG PO CAPS: ORAL_CAPSULE | ORAL | 1 refills | Status: AC

## 2024-08-28 MED ORDER — OZEMPIC (0.25 OR 0.5 MG/DOSE) 2 MG/3ML ~~LOC~~ SOPN: PEN_INJECTOR | SUBCUTANEOUS | 0 refills | Status: AC

## 2024-08-28 MED ORDER — OXYCODONE HCL 10 MG PO TABS
10.0000 mg | ORAL_TABLET | Freq: Four times a day (QID) | ORAL | 0 refills | Status: DC | PRN
  Filled 2024-08-28: qty 120, 30d supply, fill #0

## 2024-08-28 MED ORDER — GABAPENTIN 300 MG PO CAPS
300.0000 mg | ORAL_CAPSULE | Freq: Every day | ORAL | 1 refills | Status: AC
  Filled 2024-08-28: qty 90, 90d supply, fill #0

## 2024-08-28 MED ORDER — CELECOXIB 400 MG PO CAPS
400.0000 mg | ORAL_CAPSULE | Freq: Two times a day (BID) | ORAL | 0 refills | Status: AC
  Filled 2024-09-14: qty 60, 30d supply, fill #0

## 2024-08-31 ENCOUNTER — Other Ambulatory Visit (HOSPITAL_COMMUNITY): Payer: Self-pay

## 2024-09-11 ENCOUNTER — Ambulatory Visit

## 2024-09-14 ENCOUNTER — Other Ambulatory Visit: Payer: Self-pay

## 2024-09-15 ENCOUNTER — Other Ambulatory Visit: Payer: Self-pay

## 2024-09-21 ENCOUNTER — Telehealth: Payer: Self-pay | Admitting: Internal Medicine

## 2024-09-21 NOTE — Telephone Encounter (Signed)
 Copied from CRM #[id number]. Topic: General - Other >> Sep 21, 2024 12:38 PM Sophia H wrote:  Reason for CRM: Patient states she was told that she can't see gynecology till February. Wanting to know if there is any way she can get in somewhere else sooner or if PCP can help with medication in the mean time for cramping. Please advise # [phone]

## 2024-09-22 ENCOUNTER — Ambulatory Visit: Payer: Self-pay

## 2024-09-22 NOTE — Telephone Encounter (Signed)
 1st attempt, LVM    Copied from CRM #[id number]. Topic: Clinical - Red Word Triage >> Sep 21, 2024  4:23 PM Kevelyn M wrote: Red Word that prompted transfer to Nurse Triage: Menstrual bleeding and normally takes medication to stop the bleeding and it is no longer working and is experiencing really bad cramps that is worsening. GYN referral can't see her until February.

## 2024-09-22 NOTE — Telephone Encounter (Signed)
 Spoke with patient.  She states [facility] to patient in February however it's noted in the chart from a MyChart message that she would be able  to schedule an appt in Dec.   To clarify: She said at the last appt   in November, they reached out to her and was told it would be February that she would be able to be be seen.    Would we be able to refer to another location/ GYN office in [place] or make the referral urgent?    She is not opposed to going to [facility].   Please advise

## 2024-09-22 NOTE — Telephone Encounter (Signed)
 FYI Only or Action Required?: FYI only for provider: Denied scheduling.  Patient was last seen in primary care on 08/18/2024 by Vicci Barnie NOVAK, MD.  Called Nurse Triage reporting Vaginal Bleeding.  Symptoms began several weeks ago.  Interventions attempted: Nothing.  Symptoms are: unchanged.  Triage Disposition: See PCP Within 2 Weeks  Patient/caregiver understands and will follow disposition?: No  Reason for Disposition  All other vaginal symptoms  (Exceptions: Feels like prior yeast infection, minor abrasion, mild rash < 24 hour duration, mild itching, vaginal dryness during sex.)  Answer Assessment - Initial Assessment Questions Patient states she was off megestrol  (MEGACE ) 40 MG tablet, then back on it and its not working. Attempted to schedule with PCP today, patient responded what is she going to do! I've already seen her and she has not done crap, she knows what the problem is I asked patient what she is looking for then , patient yelled she is looking for help and for the bleeding to stop. Please advise.   1. SYMPTOM: What's the main symptom you're concerned about? (e.g., pain, itching, dryness)     Vaginal bleeding, cramps  2. ONSET: When did the bleeding start?     Months  3. PAIN: Is there any pain? If Yes, ask: How bad is it? (Scale: 1-10; mild, moderate, severe)     10/10  4. OTHER SYMPTOMS: Do you have any other symptoms? (e.g., fever, itching, vaginal bleeding, pain with urination, injury to genital area, vaginal foreign body)     Denies  Protocols used: Vaginal Symptoms-A-AH

## 2024-09-23 NOTE — Telephone Encounter (Signed)
 Hannah Baldwin: see Hannah Baldwin's message. Let me know if you can get her in sooner or if I need to put in new referral. Should look at some of the [facility] Practices in [place] as well.

## 2024-09-23 NOTE — Telephone Encounter (Signed)
 Noted. Please see telephone encounter dated 09/22/24. Jesslyn Katz, RN spoke with the patient to further address concerns. No further assistance needed at this time.

## 2024-09-23 NOTE — Telephone Encounter (Signed)
 Good morning Hannah Baldwin. Patient prefers [place] or [facility] location.

## 2024-09-25 ENCOUNTER — Other Ambulatory Visit (HOSPITAL_COMMUNITY): Payer: Self-pay

## 2024-09-25 ENCOUNTER — Other Ambulatory Visit: Payer: Self-pay

## 2024-09-25 MED ORDER — FOLIC ACID 1 MG PO TABS
1.0000 mg | ORAL_TABLET | Freq: Every day | ORAL | 1 refills | Status: AC
  Filled 2024-09-25: qty 90, 90d supply, fill #0

## 2024-09-25 MED ORDER — OZEMPIC (0.25 OR 0.5 MG/DOSE) 2 MG/3ML ~~LOC~~ SOPN
0.5000 mg | PEN_INJECTOR | SUBCUTANEOUS | 0 refills | Status: AC
  Filled 2024-09-25: qty 3, 28d supply, fill #0

## 2024-09-25 MED ORDER — CYCLOBENZAPRINE HCL 5 MG PO TABS
5.0000 mg | ORAL_TABLET | Freq: Three times a day (TID) | ORAL | 0 refills | Status: AC
  Filled 2024-09-25 – 2024-10-30 (×2): qty 90, 30d supply, fill #0

## 2024-09-25 MED ORDER — ACCRUFER 30 MG PO CAPS
1.0000 | ORAL_CAPSULE | Freq: Two times a day (BID) | ORAL | 1 refills | Status: AC
  Filled 2024-09-25: qty 180, 90d supply, fill #0

## 2024-09-25 MED ORDER — OXYCODONE HCL 10 MG PO TABS
10.0000 mg | ORAL_TABLET | Freq: Four times a day (QID) | ORAL | 0 refills | Status: AC | PRN
  Filled 2024-09-25: qty 120, 30d supply, fill #0

## 2024-09-25 MED ORDER — CELECOXIB 400 MG PO CAPS
400.0000 mg | ORAL_CAPSULE | Freq: Two times a day (BID) | ORAL | 0 refills | Status: AC
  Filled 2024-09-25 – 2024-10-30 (×2): qty 60, 30d supply, fill #0

## 2024-09-25 MED ORDER — NALOXONE HCL 4 MG/0.1ML NA LIQD: NASAL | 0 refills | Status: AC

## 2024-09-25 NOTE — Telephone Encounter (Signed)
 Noted! Thank you

## 2024-09-25 NOTE — Telephone Encounter (Signed)
 Please call her back today or next week to inform her of this appt. Looks like she does not look at [facility].

## 2024-09-26 ENCOUNTER — Other Ambulatory Visit (HOSPITAL_COMMUNITY): Payer: Self-pay

## 2024-09-28 ENCOUNTER — Other Ambulatory Visit: Payer: Self-pay

## 2024-09-30 ENCOUNTER — Other Ambulatory Visit (HOSPITAL_COMMUNITY): Payer: Self-pay

## 2024-10-05 ENCOUNTER — Other Ambulatory Visit (HOSPITAL_COMMUNITY): Payer: Self-pay

## 2024-10-09 ENCOUNTER — Other Ambulatory Visit (HOSPITAL_COMMUNITY): Payer: Self-pay

## 2024-10-10 ENCOUNTER — Other Ambulatory Visit (HOSPITAL_COMMUNITY): Payer: Self-pay

## 2024-10-12 ENCOUNTER — Ambulatory Visit: Admitting: Internal Medicine

## 2024-10-14 ENCOUNTER — Encounter: Admitting: Obstetrics and Gynecology

## 2024-10-14 ENCOUNTER — Other Ambulatory Visit (HOSPITAL_COMMUNITY): Payer: Self-pay

## 2024-10-22 ENCOUNTER — Other Ambulatory Visit (HOSPITAL_COMMUNITY): Payer: Self-pay

## 2024-10-26 ENCOUNTER — Other Ambulatory Visit (HOSPITAL_COMMUNITY): Payer: Self-pay

## 2024-10-26 MED ORDER — OZEMPIC (0.25 OR 0.5 MG/DOSE) 2 MG/3ML ~~LOC~~ SOPN
0.5000 mg | PEN_INJECTOR | SUBCUTANEOUS | 0 refills | Status: AC
  Filled 2024-10-26 – 2024-10-30 (×2): qty 3, 28d supply, fill #0

## 2024-10-26 MED ORDER — CELECOXIB 400 MG PO CAPS
400.0000 mg | ORAL_CAPSULE | Freq: Two times a day (BID) | ORAL | 0 refills | Status: AC | PRN
  Filled 2024-10-26: qty 60, 30d supply, fill #0

## 2024-10-26 MED ORDER — ACCRUFER 30 MG PO CAPS
30.0000 mg | ORAL_CAPSULE | Freq: Two times a day (BID) | ORAL | 1 refills | Status: AC
  Filled 2024-10-26: qty 180, 90d supply, fill #0

## 2024-10-26 MED ORDER — OXYCODONE HCL 15 MG PO TABS
ORAL_TABLET | ORAL | 0 refills | Status: AC
  Filled 2024-10-26: qty 120, 30d supply, fill #0

## 2024-10-26 MED ORDER — FOLIC ACID 1 MG PO TABS
1.0000 mg | ORAL_TABLET | Freq: Every day | ORAL | 1 refills | Status: AC
  Filled 2024-10-26 – 2024-10-30 (×2): qty 90, 90d supply, fill #0

## 2024-10-26 MED ORDER — NALOXONE HCL 4 MG/0.1ML NA LIQD
NASAL | 0 refills | Status: AC
  Filled 2024-10-26 (×2): qty 2, 2d supply, fill #0

## 2024-10-26 MED ORDER — CYCLOBENZAPRINE HCL 10 MG PO TABS
10.0000 mg | ORAL_TABLET | Freq: Three times a day (TID) | ORAL | 0 refills | Status: AC
  Filled 2024-10-26 – 2024-10-30 (×2): qty 90, 30d supply, fill #0

## 2024-10-27 ENCOUNTER — Other Ambulatory Visit (HOSPITAL_COMMUNITY): Payer: Self-pay

## 2024-10-27 ENCOUNTER — Other Ambulatory Visit: Payer: Self-pay | Admitting: Internal Medicine

## 2024-10-27 ENCOUNTER — Other Ambulatory Visit: Payer: Self-pay

## 2024-10-27 DIAGNOSIS — G4700 Insomnia, unspecified: Secondary | ICD-10-CM

## 2024-10-27 MED ORDER — TRAZODONE HCL 100 MG PO TABS
100.0000 mg | ORAL_TABLET | Freq: Every evening | ORAL | 0 refills | Status: AC | PRN
  Filled 2024-10-27 – 2024-10-30 (×2): qty 90, 90d supply, fill #0

## 2024-10-28 ENCOUNTER — Encounter: Payer: Self-pay | Admitting: Obstetrics and Gynecology

## 2024-10-28 ENCOUNTER — Other Ambulatory Visit (HOSPITAL_COMMUNITY): Payer: Self-pay

## 2024-10-28 ENCOUNTER — Other Ambulatory Visit: Payer: Self-pay

## 2024-10-29 ENCOUNTER — Encounter: Admitting: Obstetrics

## 2024-10-30 ENCOUNTER — Other Ambulatory Visit: Payer: Self-pay

## 2024-10-30 ENCOUNTER — Other Ambulatory Visit (HOSPITAL_COMMUNITY): Payer: Self-pay

## 2024-11-03 ENCOUNTER — Other Ambulatory Visit (HOSPITAL_COMMUNITY): Payer: Self-pay

## 2024-11-04 ENCOUNTER — Other Ambulatory Visit: Payer: Self-pay

## 2024-11-04 ENCOUNTER — Other Ambulatory Visit (HOSPITAL_COMMUNITY): Payer: Self-pay

## 2024-11-06 ENCOUNTER — Other Ambulatory Visit (HOSPITAL_COMMUNITY): Payer: Self-pay

## 2024-11-09 ENCOUNTER — Other Ambulatory Visit: Payer: Self-pay

## 2024-11-11 ENCOUNTER — Other Ambulatory Visit: Payer: Self-pay

## 2024-11-12 ENCOUNTER — Encounter: Admitting: Obstetrics & Gynecology

## 2024-11-13 ENCOUNTER — Other Ambulatory Visit: Payer: Self-pay

## 2024-11-13 ENCOUNTER — Ambulatory Visit: Admitting: Internal Medicine

## 2024-11-24 ENCOUNTER — Encounter: Admitting: Obstetrics & Gynecology

## 2024-11-27 ENCOUNTER — Ambulatory Visit: Admitting: Internal Medicine
